# Patient Record
Sex: Female | Born: 1975 | Race: Black or African American | Hispanic: No | Marital: Married | Smoking: Never smoker
Health system: Southern US, Community
[De-identification: ages and names within clinical notes are randomized; demographics above are authoritative.]

## PROBLEM LIST (undated history)

## (undated) VITALS — BP 120/83 | HR 88 | Temp 98.4°F | Resp 18 | Ht 63.0 in | Wt 162.0 lb

## (undated) DIAGNOSIS — F329 Major depressive disorder, single episode, unspecified: Secondary | ICD-10-CM

## (undated) DIAGNOSIS — I10 Essential (primary) hypertension: Secondary | ICD-10-CM

## (undated) DIAGNOSIS — T8859XA Other complications of anesthesia, initial encounter: Secondary | ICD-10-CM

## (undated) DIAGNOSIS — R112 Nausea with vomiting, unspecified: Secondary | ICD-10-CM

## (undated) DIAGNOSIS — Z9889 Other specified postprocedural states: Secondary | ICD-10-CM

## (undated) DIAGNOSIS — F32A Depression, unspecified: Secondary | ICD-10-CM

## (undated) DIAGNOSIS — I639 Cerebral infarction, unspecified: Secondary | ICD-10-CM

## (undated) DIAGNOSIS — T4145XA Adverse effect of unspecified anesthetic, initial encounter: Secondary | ICD-10-CM

## (undated) DIAGNOSIS — F319 Bipolar disorder, unspecified: Secondary | ICD-10-CM

## (undated) DIAGNOSIS — O1490 Unspecified pre-eclampsia, unspecified trimester: Secondary | ICD-10-CM

## (undated) HISTORY — PX: HERNIA REPAIR: SHX51

## (undated) HISTORY — DX: Essential (primary) hypertension: I10

## (undated) HISTORY — DX: Cerebral infarction, unspecified: I63.9

---

## 1999-09-06 ENCOUNTER — Other Ambulatory Visit: Admission: RE | Admit: 1999-09-06 | Discharge: 1999-09-06 | Payer: Self-pay | Admitting: Obstetrics and Gynecology

## 2001-07-04 ENCOUNTER — Ambulatory Visit (HOSPITAL_COMMUNITY): Admission: RE | Admit: 2001-07-04 | Discharge: 2001-07-04 | Payer: Self-pay | Admitting: *Deleted

## 2003-07-08 ENCOUNTER — Ambulatory Visit (HOSPITAL_COMMUNITY): Admission: RE | Admit: 2003-07-08 | Discharge: 2003-07-08 | Payer: Self-pay | Admitting: Obstetrics and Gynecology

## 2003-07-15 ENCOUNTER — Encounter: Admission: RE | Admit: 2003-07-15 | Discharge: 2003-07-15 | Payer: Self-pay | Admitting: Family Medicine

## 2003-07-16 ENCOUNTER — Encounter: Admission: RE | Admit: 2003-07-16 | Discharge: 2003-07-16 | Payer: Self-pay | Admitting: Family Medicine

## 2003-08-05 ENCOUNTER — Encounter: Admission: RE | Admit: 2003-08-05 | Discharge: 2003-08-05 | Payer: Self-pay | Admitting: Family Medicine

## 2003-08-19 ENCOUNTER — Encounter: Admission: RE | Admit: 2003-08-19 | Discharge: 2003-08-19 | Payer: Self-pay | Admitting: Family Medicine

## 2003-08-26 ENCOUNTER — Encounter: Admission: RE | Admit: 2003-08-26 | Discharge: 2003-08-26 | Payer: Self-pay | Admitting: *Deleted

## 2003-09-09 ENCOUNTER — Encounter: Admission: RE | Admit: 2003-09-09 | Discharge: 2003-09-09 | Payer: Self-pay | Admitting: Family Medicine

## 2003-09-16 ENCOUNTER — Ambulatory Visit (HOSPITAL_COMMUNITY): Admission: RE | Admit: 2003-09-16 | Discharge: 2003-09-16 | Payer: Self-pay | Admitting: *Deleted

## 2003-09-16 ENCOUNTER — Encounter: Admission: RE | Admit: 2003-09-16 | Discharge: 2003-09-16 | Payer: Self-pay | Admitting: *Deleted

## 2003-09-25 ENCOUNTER — Inpatient Hospital Stay (HOSPITAL_COMMUNITY): Admission: AD | Admit: 2003-09-25 | Discharge: 2003-09-25 | Payer: Self-pay | Admitting: *Deleted

## 2003-09-28 ENCOUNTER — Ambulatory Visit (HOSPITAL_COMMUNITY): Admission: RE | Admit: 2003-09-28 | Discharge: 2003-09-28 | Payer: Self-pay | Admitting: Obstetrics & Gynecology

## 2004-02-02 ENCOUNTER — Inpatient Hospital Stay (HOSPITAL_COMMUNITY): Admission: RE | Admit: 2004-02-02 | Discharge: 2004-02-07 | Payer: Self-pay | Admitting: Obstetrics & Gynecology

## 2004-02-02 ENCOUNTER — Encounter (INDEPENDENT_AMBULATORY_CARE_PROVIDER_SITE_OTHER): Payer: Self-pay | Admitting: Specialist

## 2008-11-11 ENCOUNTER — Emergency Department (HOSPITAL_COMMUNITY): Admission: EM | Admit: 2008-11-11 | Discharge: 2008-11-11 | Payer: Self-pay | Admitting: Family Medicine

## 2010-01-26 ENCOUNTER — Emergency Department (HOSPITAL_COMMUNITY)
Admission: EM | Admit: 2010-01-26 | Discharge: 2010-01-26 | Payer: Self-pay | Source: Home / Self Care | Admitting: Family Medicine

## 2010-04-08 ENCOUNTER — Inpatient Hospital Stay (INDEPENDENT_AMBULATORY_CARE_PROVIDER_SITE_OTHER)
Admission: RE | Admit: 2010-04-08 | Discharge: 2010-04-08 | Disposition: A | Discharge status: Home / Self Care | Payer: Medicaid Other | Source: Ambulatory Visit | Attending: Family Medicine | Admitting: Family Medicine

## 2010-04-08 DIAGNOSIS — J40 Bronchitis, not specified as acute or chronic: Secondary | ICD-10-CM

## 2010-07-07 NOTE — Discharge Summary (Signed)
Mallory Robinson, THATCH NO.:  [id number]   MEDICAL RECORD NO.:  [id number]          PATIENT TYPE:  INP   LOCATION:  9319                          FACILITY:  [facility]   PHYSICIAN:  Roseanna Rainbow, M.D.DATE OF BIRTH:  [birth year]   DATE OF ADMISSION:  02/02/2004  DATE OF DISCHARGE:  02/07/2004                                 DISCHARGE SUMMARY   CHIEF COMPLAINT:  The patient is a 35 year old gravida 2 para 1 with an  estimated date of confinement of February 17, 2004 with an intrauterine  pregnancy at 57 and six-sevenths weeks with chronic hypertension, rule out  superimposed pregnancy-induced hypertension and visual disturbances.  Please  see the dictated History and Physical for further details.   HOSPITAL COURSE:  The patient was admitted.  She was started on magnesium  sulfate seizure prophylaxis and she underwent a repeat cesarean delivery.  Please see the dictated operative report for further details.  Postoperatively, she received magnesium sulfate seizure prophylaxis for 36  hours.  She had an unsustained fever on postoperative day #3 with a Tmax of  102.7.  She remained afebrile for the remainder of her hospital course.  Her  blood pressures remained borderline elevated and she was asymptomatic.  She  was discharged to home on postoperative day #5 tolerating a regular diet.   DISCHARGE DIAGNOSES:  1.  Severe pregnancy-induced hypertension.  2.  Intrauterine pregnancy at 37+ weeks.  3.  History of a previous cesarean delivery.   PROCEDURE:  Repeat cesarean delivery.   CONDITION:  Stable.   DIET:  Regular.   ACTIVITY:  No strenuous activity, pelvic rest.   MEDICATIONS:  Percocet, ibuprofen, prenatal vitamins, ferrous sulfate, and  Colace.   DISPOSITION:  The patient was to follow up in the office in 2 days.     Collier Flowers  D:  02/25/2004  T:  02/25/2004  Job:  [id number]
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
## 2010-07-07 NOTE — Op Note (Signed)
NAMEACHSAH, MCQUADE                 ACCOUNT NO.:  [id number]   MEDICAL RECORD NO.:  [id number]          PATIENT TYPE:  INP   LOCATION:  9135                          [facility]ACILITY:  [facility]   PHYSICIAN:  Roseanna Rainbow, M.D.DATE OF BIRTH:  [birth year]   DATE OF PROCEDURE:  02/02/2004  DATE OF DISCHARGE:                                 OPERATIVE REPORT   PREOPERATIVE DIAGNOSES:  1.  Intrauterine pregnancy at 37-6/7 weeks with severe pregnancy-induced      hypertension.  2.  History of a previous cesarean delivery, declines trial of labor.   POSTOPERATIVE DIAGNOSES:  1.  Intrauterine pregnancy at 37-6/7 weeks with severe pregnancy-induced      hypertension.  2.  History of a previous cesarean delivery, declines trial of labor.   PROCEDURE:  Repeat low uterine flap elliptical cesarean delivery via  Pfannenstiel skin incision.   SURGEONS:  Roseanna Rainbow, M.D., Bing Neighbors. Clearance Coots, M.D.   ANESTHESIA:  Spinal.   ESTIMATED BLOOD LOSS:  600 mL.   COMPLICATIONS:  None.   IV fluids and urine output as per anesthesiology.   PROCEDURE:  The patient was taken to the operating room, where a spinal  anesthetic was administered without difficulty.  She was then placed in the  dorsal supine position with a leftward tilt and prepped and draped in the  usual sterile fashion.  A Pfannenstiel skin incision was then made through  the previous scar with a scalpel.  This was extended down to the underlying  fascia with a Bovie.  The fascia was nicked in the midline.  The fascial  incision was then extended bilaterally with curved Mayo scissors.  The  superior aspect of the fascial incision was then tented up with Kocher  clamps and the underlying rectus muscles dissected off.  The inferior aspect  of the fascial incision was manipulated in a similar fashion.  The rectus  muscles were separated in the midline.  The parietal peritoneum was entered  bluntly.  The peritoneal incision was then  extended superiorly and  inferiorly with good visualization of the bladder.  The bladder blade was  then placed.  The vesicouterine peritoneum was tented up and entered  sharply.  This incision was then extended bilaterally and the bladder flap  created sharply.  The lower uterine segment was then incised in a transverse  fashion with a scalpel.  The incision was extended bilaterally with curved  Mayo scissors.  At this point the infant's head was noted to be  hyperextended.  The infant's head was then delivered atraumatically with the  assistance of vacuum extraction.  The head was delivered on the second  attempt.  The cord was clamped and cut.  The infant was handed over to the  awaiting neonatologist.  The placenta was then removed.  The uterus was  evacuated of any remaining amniotic fluid, clots, and debris with a  moistened laparotomy sponge.  The cervix was dilated with a Kelly clamp.  The uterine incision was then reapproximated in a running interlocking  fashion.  A second imbricating suture line was  then placed.  Adequate  hemostasis was noted.  The paracolic gutters were copiously irrigated.  The  parietal peritoneum was reapproximated in a running fashion with 2-0 Vicryl.  The fascia  was reapproximated in a running fashion with 0 PDS.  The skin was  reapproximated in a subcuticular fashion with 3-0 Monocryl.  At the close of  the procedure, the instrument and pad counts were said to be correct x2.  Cefazolin 1 g was given at cord clamp.  The patient was taken to the PACU  awake and in stable condition.     Collier Flowers  D:  02/02/2004  T:  02/02/2004  Job:  [id number]

## 2011-03-01 ENCOUNTER — Emergency Department (INDEPENDENT_AMBULATORY_CARE_PROVIDER_SITE_OTHER)
Admission: EM | Admit: 2011-03-01 | Discharge: 2011-03-01 | Disposition: A | Discharge status: Home / Self Care | Payer: Medicaid Other | Source: Home / Self Care | Attending: Family Medicine | Admitting: Family Medicine

## 2011-03-01 ENCOUNTER — Encounter (HOSPITAL_COMMUNITY): Payer: Self-pay | Admitting: Emergency Medicine

## 2011-03-01 DIAGNOSIS — J069 Acute upper respiratory infection, unspecified: Secondary | ICD-10-CM

## 2011-03-01 MED ORDER — HYDROCOD POLST-CHLORPHEN POLST 10-8 MG/5ML PO LQCR: 5.0000 mL | Freq: Two times a day (BID) | ORAL | Status: DC

## 2011-03-01 MED ORDER — AZITHROMYCIN 250 MG PO TABS: ORAL_TABLET | ORAL | Status: AC

## 2011-03-01 NOTE — ED Provider Notes (Addendum)
History     CSN: [id number]  Arrival date & time 03/01/11  0865   First MD Initiated Contact with Patient 03/01/11 1725      Chief Complaint  Patient presents with  . Cough    (Consider location/radiation/quality/duration/timing/severity/associated sxs/prior treatment) Patient is a 36 y.o. female presenting with cough. The history is provided by the patient.  Cough This is a new problem. The current episode started more than 1 week ago. The problem has not changed since onset.The cough is non-productive. There has been no fever. Associated symptoms include rhinorrhea and sore throat. Pertinent negatives include no chills, no headaches and no wheezing. She is not a smoker.    History reviewed. No pertinent past medical history.  History reviewed. No pertinent past surgical history.  No family history on file.  History  Substance Use Topics  . Smoking status: Never Smoker   . Smokeless tobacco: Not on file  . Alcohol Use: Yes     Occasional    OB History    Grav Para Term Preterm Abortions TAB SAB Ect Mult Living                  Review of Systems  Constitutional: Negative for chills.  HENT: Positive for nosebleeds, congestion, sore throat and rhinorrhea.   Respiratory: Positive for cough. Negative for wheezing.   Neurological: Negative for headaches.    Allergies  Penicillins  Home Medications   Current Outpatient Rx  Name Route Sig Dispense Refill  . BIOTIN 1000 MCG PO TABS Oral Take 1,000 mcg by mouth 3 (three) times daily.    Marland Kitchen FLUOXETINE HCL 20 MG PO CAPS Oral Take 20 mg by mouth 2 (two) times daily.    Marland Kitchen LISINOPRIL-HYDROCHLOROTHIAZIDE 20-12.5 MG PO TABS Oral Take 1 tablet by mouth daily.    . QUETIAPINE FUMARATE ER 150 MG PO TB24 Oral Take 150 mg by mouth at bedtime.    . TOPIRAMATE 25 MG PO CPSP Oral Take 25 mg by mouth 1 day or 1 dose.    Marland Kitchen AZITHROMYCIN 250 MG PO TABS  Take as directed on pack 6 each 0  . HYDROCOD POLST-CPM POLST ER 10-8 MG/5ML PO  LQCR Oral Take 5 mLs by mouth every 12 (twelve) hours. 115 mL 0    BP 131/92  Pulse 82  Temp(Src) 99.5 F (37.5 C) (Oral)  Resp 16  SpO2 100%  LMP 03/01/2011  Physical Exam  Nursing note and vitals reviewed. Constitutional: She is oriented to person, place, and time. She appears well-developed and well-nourished.  HENT:  Head: Normocephalic.  Right Ear: External ear normal.  Left Ear: External ear normal.  Nose: Nose normal.  Mouth/Throat: Oropharynx is clear and moist.  Eyes: Conjunctivae and EOM are normal. Pupils are equal, round, and reactive to light.  Neck: Normal range of motion. Neck supple.  Cardiovascular: Normal rate, regular rhythm, normal heart sounds and intact distal pulses.   Pulmonary/Chest: Effort normal and breath sounds normal.  Lymphadenopathy:    She has no cervical adenopathy.  Neurological: She is alert and oriented to person, place, and time.  Skin: Skin is warm and dry.    ED Course  Procedures (including critical care time)  Labs Reviewed - No data to display No results found.   1. URI (upper respiratory infection)       MDM          Barkley Bruns, MD 03/01/11 1940  Barkley Bruns, MD 03/01/11 [phone]

## 2011-03-01 NOTE — ED Notes (Signed)
Pt c/o cough, fatigue, and sore throat for a month. Also having ear itchiness and nose bleeds.

## 2012-07-28 ENCOUNTER — Emergency Department (HOSPITAL_COMMUNITY)
Admission: EM | Admit: 2012-07-28 | Discharge: 2012-07-28 | Disposition: A | Discharge status: Home / Self Care | Payer: Medicaid Other | Source: Home / Self Care | Attending: Emergency Medicine | Admitting: Emergency Medicine

## 2012-07-28 ENCOUNTER — Encounter (HOSPITAL_COMMUNITY): Payer: Self-pay | Admitting: *Deleted

## 2012-07-28 DIAGNOSIS — N390 Urinary tract infection, site not specified: Secondary | ICD-10-CM

## 2012-07-28 LAB — POCT URINALYSIS DIP (DEVICE)
Glucose, UA: 100 mg/dL — AB
Urobilinogen, UA: 0.2 mg/dL (ref 0.0–1.0)
pH: 6 (ref 5.0–8.0)

## 2012-07-28 LAB — POCT PREGNANCY, URINE: Preg Test, Ur: NEGATIVE

## 2012-07-28 MED ORDER — PHENAZOPYRIDINE HCL 200 MG PO TABS: 200.0000 mg | ORAL_TABLET | Freq: Three times a day (TID) | ORAL | Status: DC

## 2012-07-28 MED ORDER — SULFAMETHOXAZOLE-TRIMETHOPRIM 800-160 MG PO TABS: 1.0000 | ORAL_TABLET | Freq: Two times a day (BID) | ORAL | Status: AC

## 2012-07-28 NOTE — ED Notes (Signed)
Pt  Reports  Symptoms  Of  Urinary  Frequency  With  Pressure  And  Urgency  denys  Any bleeding   /  Discharge

## 2012-07-28 NOTE — ED Provider Notes (Signed)
A History     CSN: [id number]  Arrival date & time 07/28/12  1710   First MD Initiated Contact with Patient 07/28/12 1824      Chief Complaint  Patient presents with  . Urinary Frequency    (Consider location/radiation/quality/duration/timing/severity/associated sxs/prior treatment) HPI Comments: Since urgent care this evening complaining of urinary pressure increased frequency and pain. For the last couple days. She reports that his feels just like a urinary tract infection is being many years and she had one. Patient denies any vaginal discharge or concerns of having been exposed to any sexually transmitted illnesses. She denies any nausea vomiting or flank pain.  Patient is a 37 y.o. female presenting with frequency. The history is provided by the patient.  Urinary Frequency This is a new problem. The current episode started 2 days ago. The problem occurs constantly. The problem has been gradually worsening. Exacerbated by: urinating. Nothing relieves the symptoms. She has tried nothing for the symptoms. The treatment provided no relief.    History reviewed. No pertinent past medical history.  History reviewed. No pertinent past surgical history.  No family history on file.  History  Substance Use Topics  . Smoking status: Never Smoker   . Smokeless tobacco: Not on file  . Alcohol Use: Yes     Comment: Occasional    OB History   Grav Para Term Preterm Abortions TAB SAB Ect Mult Living                  Review of Systems  Constitutional: Negative for fever, diaphoresis, activity change and fatigue.  Genitourinary: Positive for dysuria and frequency. Negative for urgency, hematuria, flank pain, decreased urine volume, vaginal bleeding, vaginal discharge and pelvic pain.    Allergies  Penicillins  Home Medications   Current Outpatient Rx  Name  Route  Sig  Dispense  Refill  . Biotin 1000 MCG tablet   Oral   Take 1,000 mcg by mouth 3 (three) times daily.         . chlorpheniramine-HYDROcodone (TUSSIONEX PENNKINETIC ER) 10-8 MG/5ML LQCR   Oral   Take 5 mLs by mouth every 12 (twelve) hours.   115 mL   0   . FLUoxetine (PROZAC) 20 MG capsule   Oral   Take 20 mg by mouth 2 (two) times daily.         Marland Kitchen lisinopril-hydrochlorothiazide (PRINZIDE,ZESTORETIC) 20-12.5 MG per tablet   Oral   Take 1 tablet by mouth daily.         . QUEtiapine Fumarate (SEROQUEL XR) 150 MG 24 hr tablet   Oral   Take 150 mg by mouth at bedtime.         . topiramate (TOPAMAX) 25 MG capsule   Oral   Take 25 mg by mouth 1 day or 1 dose.           BP 141/100  Pulse 83  Temp(Src) 98.2 F (36.8 C) (Oral)  Resp 20  SpO2 98%  LMP 06/11/2012  Physical Exam  Nursing note and vitals reviewed. Constitutional: She appears well-developed and well-nourished.  Abdominal: Soft. Normal appearance. She exhibits no mass. There is no hepatosplenomegaly, splenomegaly or hepatomegaly. There is tenderness in the suprapubic area. There is no rebound, no guarding, no CVA tenderness, no tenderness at McBurney's point and negative Murphy's sign. No hernia. Hernia confirmed negative in the ventral area.  Neurological: She is alert.  Skin: No rash noted.    ED Course  Procedures (including critical  care time)  Labs Reviewed  POCT URINALYSIS DIP (DEVICE) - Abnormal; Notable for the following:    Glucose, UA 100 (*)    Hgb urine dipstick TRACE (*)    Nitrite POSITIVE (*)    Leukocytes, UA TRACE (*)    All other components within normal limits  POCT PREGNANCY, URINE   No results found.   No diagnosis found.    MDM  Uncomplicated urinary tract infection,  Rx for Keflex 500 mg TID X 7 DAYS Rx Pyridium       Jimmie Molly, MD 07/28/12 [phone]

## 2013-03-04 ENCOUNTER — Inpatient Hospital Stay (HOSPITAL_COMMUNITY)
Admission: AD | Admit: 2013-03-04 | Discharge: 2013-03-11 | DRG: 897 | Disposition: A | Discharge status: Home / Self Care | Payer: No Typology Code available for payment source | Source: Intra-hospital | Attending: Psychiatry | Admitting: Psychiatry

## 2013-03-04 ENCOUNTER — Encounter (HOSPITAL_COMMUNITY): Payer: Self-pay | Admitting: Emergency Medicine

## 2013-03-04 ENCOUNTER — Emergency Department (HOSPITAL_COMMUNITY)
Admission: EM | Admit: 2013-03-04 | Discharge: 2013-03-04 | Disposition: A | Discharge status: Other Acute Inpatient Hospital | Payer: Medicaid Other | Attending: Emergency Medicine | Admitting: Emergency Medicine

## 2013-03-04 ENCOUNTER — Encounter (HOSPITAL_COMMUNITY): Payer: Self-pay | Admitting: *Deleted

## 2013-03-04 DIAGNOSIS — Z3202 Encounter for pregnancy test, result negative: Secondary | ICD-10-CM | POA: Insufficient documentation

## 2013-03-04 DIAGNOSIS — Z880 Allergy status to penicillin: Secondary | ICD-10-CM | POA: Insufficient documentation

## 2013-03-04 DIAGNOSIS — R4689 Other symptoms and signs involving appearance and behavior: Secondary | ICD-10-CM

## 2013-03-04 DIAGNOSIS — F4310 Post-traumatic stress disorder, unspecified: Secondary | ICD-10-CM | POA: Diagnosis present

## 2013-03-04 DIAGNOSIS — R4589 Other symptoms and signs involving emotional state: Secondary | ICD-10-CM

## 2013-03-04 DIAGNOSIS — R45851 Suicidal ideations: Secondary | ICD-10-CM | POA: Insufficient documentation

## 2013-03-04 DIAGNOSIS — F1020 Alcohol dependence, uncomplicated: Principal | ICD-10-CM | POA: Diagnosis present

## 2013-03-04 DIAGNOSIS — F314 Bipolar disorder, current episode depressed, severe, without psychotic features: Secondary | ICD-10-CM | POA: Diagnosis present

## 2013-03-04 DIAGNOSIS — Z79899 Other long term (current) drug therapy: Secondary | ICD-10-CM

## 2013-03-04 DIAGNOSIS — F319 Bipolar disorder, unspecified: Secondary | ICD-10-CM | POA: Diagnosis present

## 2013-03-04 HISTORY — DX: Bipolar disorder, unspecified: F31.9

## 2013-03-04 HISTORY — DX: Depression, unspecified: F32.A

## 2013-03-04 HISTORY — DX: Major depressive disorder, single episode, unspecified: F32.9

## 2013-03-04 LAB — CBC
HCT: 31.6 % — ABNORMAL LOW (ref 36.0–46.0)
HEMOGLOBIN: 9.6 g/dL — AB (ref 12.0–15.0)
MCH: 19.3 pg — ABNORMAL LOW (ref 26.0–34.0)
MCHC: 30.4 g/dL (ref 30.0–36.0)
MCV: 63.5 fL — ABNORMAL LOW (ref 78.0–100.0)
Platelets: 215 10*3/uL (ref 150–400)
RBC: 4.98 MIL/uL (ref 3.87–5.11)
RDW: 19.9 % — AB (ref 11.5–15.5)
WBC: 6.7 10*3/uL (ref 4.0–10.5)

## 2013-03-04 LAB — BASIC METABOLIC PANEL
BUN: 9 mg/dL (ref 6–23)
CALCIUM: 9.5 mg/dL (ref 8.4–10.5)
CO2: 23 mEq/L (ref 19–32)
CREATININE: 0.88 mg/dL (ref 0.50–1.10)
Chloride: 101 mEq/L (ref 96–112)
GFR, EST NON AFRICAN AMERICAN: 82 mL/min — AB (ref >90)
Glucose, Bld: 106 mg/dL — ABNORMAL HIGH (ref 70–99)
Potassium: 4 mEq/L (ref 3.7–5.3)
SODIUM: 140 meq/L (ref 137–147)

## 2013-03-04 LAB — RAPID URINE DRUG SCREEN, HOSP PERFORMED
Amphetamines: NOT DETECTED
Barbiturates: NOT DETECTED
Benzodiazepines: NOT DETECTED
COCAINE: NOT DETECTED
Opiates: NOT DETECTED
Tetrahydrocannabinol: NOT DETECTED

## 2013-03-04 LAB — ETHANOL: Alcohol, Ethyl (B): 106 mg/dL — ABNORMAL HIGH (ref 0–11)

## 2013-03-04 LAB — PREGNANCY, URINE: PREG TEST UR: NEGATIVE

## 2013-03-04 MED ORDER — CHLORDIAZEPOXIDE HCL 25 MG PO CAPS
25.0000 mg | ORAL_CAPSULE | Freq: Four times a day (QID) | ORAL | Status: AC
  Administered 2013-03-04 (×3): 25 mg via ORAL
  Filled 2013-03-04 (×3): qty 1

## 2013-03-04 MED ORDER — ZOLPIDEM TARTRATE 5 MG PO TABS: 5.0000 mg | ORAL_TABLET | Freq: Every evening | ORAL | Status: DC | PRN

## 2013-03-04 MED ORDER — THIAMINE HCL 100 MG/ML IJ SOLN
100.0000 mg | Freq: Once | INTRAMUSCULAR | Status: AC
  Administered 2013-03-04: 100 mg via INTRAMUSCULAR
  Filled 2013-03-04: qty 2

## 2013-03-04 MED ORDER — LOPERAMIDE HCL 2 MG PO CAPS: 2.0000 mg | ORAL_CAPSULE | ORAL | Status: AC | PRN

## 2013-03-04 MED ORDER — CHLORDIAZEPOXIDE HCL 25 MG PO CAPS
25.0000 mg | ORAL_CAPSULE | ORAL | Status: AC
  Administered 2013-03-06 (×2): 25 mg via ORAL
  Filled 2013-03-04 (×2): qty 1

## 2013-03-04 MED ORDER — CHLORDIAZEPOXIDE HCL 25 MG PO CAPS: 25.0000 mg | ORAL_CAPSULE | Freq: Once | ORAL | Status: DC

## 2013-03-04 MED ORDER — IBUPROFEN 200 MG PO TABS: 600.0000 mg | ORAL_TABLET | Freq: Three times a day (TID) | ORAL | Status: DC | PRN

## 2013-03-04 MED ORDER — ONDANSETRON 4 MG PO TBDP
4.0000 mg | ORAL_TABLET | Freq: Four times a day (QID) | ORAL | Status: AC | PRN
  Administered 2013-03-04: 4 mg via ORAL
  Filled 2013-03-04: qty 1

## 2013-03-04 MED ORDER — CHLORDIAZEPOXIDE HCL 25 MG PO CAPS
25.0000 mg | ORAL_CAPSULE | Freq: Three times a day (TID) | ORAL | Status: AC
Start: 2013-03-05 — End: 2013-03-05
  Administered 2013-03-05 (×3): 25 mg via ORAL
  Filled 2013-03-04 (×3): qty 1

## 2013-03-04 MED ORDER — ACETAMINOPHEN 325 MG PO TABS
650.0000 mg | ORAL_TABLET | Freq: Four times a day (QID) | ORAL | Status: DC | PRN
Start: 2013-03-04 — End: 2013-03-11
  Administered 2013-03-05 – 2013-03-11 (×3): 650 mg via ORAL
  Filled 2013-03-04 (×3): qty 2

## 2013-03-04 MED ORDER — MAGNESIUM HYDROXIDE 400 MG/5ML PO SUSP: 30.0000 mL | Freq: Every day | ORAL | Status: DC | PRN

## 2013-03-04 MED ORDER — CHLORDIAZEPOXIDE HCL 25 MG PO CAPS
25.0000 mg | ORAL_CAPSULE | Freq: Four times a day (QID) | ORAL | Status: AC | PRN
  Administered 2013-03-04: 25 mg via ORAL

## 2013-03-04 MED ORDER — HYDROXYZINE HCL 25 MG PO TABS
25.0000 mg | ORAL_TABLET | Freq: Four times a day (QID) | ORAL | Status: AC | PRN
Start: 2013-03-04 — End: 2013-03-07

## 2013-03-04 MED ORDER — VITAMIN B-1 100 MG PO TABS
100.0000 mg | ORAL_TABLET | Freq: Every day | ORAL | Status: DC
  Administered 2013-03-05 – 2013-03-10 (×6): 100 mg via ORAL
  Filled 2013-03-04 (×9): qty 1

## 2013-03-04 MED ORDER — ADULT MULTIVITAMIN W/MINERALS CH
1.0000 | ORAL_TABLET | Freq: Every day | ORAL | Status: DC
  Administered 2013-03-04 – 2013-03-10 (×7): 1 via ORAL
  Filled 2013-03-04 (×11): qty 1

## 2013-03-04 MED ORDER — ACETAMINOPHEN 325 MG PO TABS: 650.0000 mg | ORAL_TABLET | ORAL | Status: DC | PRN

## 2013-03-04 MED ORDER — TRAZODONE HCL 50 MG PO TABS
50.0000 mg | ORAL_TABLET | Freq: Every evening | ORAL | Status: DC | PRN
  Administered 2013-03-04 – 2013-03-10 (×2): 50 mg via ORAL
  Filled 2013-03-04: qty 1
  Filled 2013-03-04: qty 14
  Filled 2013-03-04: qty 1

## 2013-03-04 MED ORDER — LORAZEPAM 1 MG PO TABS: 1.0000 mg | ORAL_TABLET | Freq: Three times a day (TID) | ORAL | Status: DC | PRN

## 2013-03-04 MED ORDER — CHLORDIAZEPOXIDE HCL 25 MG PO CAPS
25.0000 mg | ORAL_CAPSULE | Freq: Every day | ORAL | Status: AC
  Administered 2013-03-07: 25 mg via ORAL
  Filled 2013-03-04: qty 1

## 2013-03-04 MED ORDER — ALUM & MAG HYDROXIDE-SIMETH 200-200-20 MG/5ML PO SUSP: 30.0000 mL | ORAL | Status: DC | PRN

## 2013-03-04 NOTE — Progress Notes (Signed)
[facility] provided pt with a list of primary care resources and a [employer] of the [place].

## 2013-03-04 NOTE — ED Notes (Signed)
Report called to Adult Unit [facility]; [facility] notified of need for transport to [facility]

## 2013-03-04 NOTE — [facility] Group Notes (Signed)
[facility] LCSW Group Therapy  03/04/2013 3:34 PM  Type of Therapy:  Group Therapy  Participation Level:  None-Tracie just arrived on unit and came to group during last five minutes. She listened quietly as others shared.   Emotion Regulation: This group focused on both positive and negative emotion identification and allowed group members to process ways to identify feelings, regulate negative emotions, and find healthy ways to manage internal/external emotions. Group members were asked to reflect on a time when their reaction to an emotion led to a negative outcome and explored how alternative responses using emotion regulation would have benefited them. Group members were also asked to discuss a time when emotion regulation was utilized when a negative emotion was experienced.    Smart, Billee Balcerzak LCSWA  03/04/2013, 3:34 PM
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
## 2013-03-04 NOTE — Progress Notes (Signed)
Patient received a Tele Assessment as a walk in at [facility].  Writer consulted with the PA, (John Withrow).  Per, NP (John C. Withrow), the patient meets criteria for inpatient hospitalization and will be accepted to [facility] Bed 305-02.  Writer informed the charge nurse Delsa Sale) that the patient will be coming to the ER for medical clearance.  Writer informed the TTS Counselor Arby Barrette that the patient has been accepted to [facility].

## 2013-03-04 NOTE — ED Provider Notes (Signed)
CSN: [id number]     Arrival date & time 03/04/13  5573 History   First MD Initiated Contact with Patient 03/04/13 0914     Chief Complaint  Patient presents with  . Medical Clearance    HPI Hx of bipolar. Off medications for several months. Vague thoughts of jumping off bridge. No ingestions. No hallucinations. Pt concerned and pt spouse concerned. Went to [facility] and was told to come to ER for clearance. Bed available.   Past Medical History  Diagnosis Date  . Bipolar 1 disorder   . Depression    No past surgical history on file. No family history on file. History  Substance Use Topics  . Smoking status: Never Smoker   . Smokeless tobacco: Not on file  . Alcohol Use: Yes     Comment: Occasional   OB History   Grav Para Term Preterm Abortions TAB SAB Ect Mult Living                 Review of Systems  All other systems reviewed and are negative.    Allergies  Penicillins  Home Medications   Current Outpatient Rx  Name  Route  Sig  Dispense  Refill  . Biotin 1000 MCG tablet   Oral   Take 1,000 mcg by mouth 3 (three) times daily.         . chlorpheniramine-HYDROcodone (TUSSIONEX PENNKINETIC ER) 10-8 MG/5ML LQCR   Oral   Take 5 mLs by mouth every 12 (twelve) hours.   115 mL   0   . FLUoxetine (PROZAC) 20 MG capsule   Oral   Take 20 mg by mouth 2 (two) times daily.         Marland Kitchen lisinopril-hydrochlorothiazide (PRINZIDE,ZESTORETIC) 20-12.5 MG per tablet   Oral   Take 1 tablet by mouth daily.         . phenazopyridine (PYRIDIUM) 200 MG tablet   Oral   Take 1 tablet (200 mg total) by mouth 3 (three) times daily.   6 tablet   0   . QUEtiapine Fumarate (SEROQUEL XR) 150 MG 24 hr tablet   Oral   Take 150 mg by mouth at bedtime.         . topiramate (TOPAMAX) 25 MG capsule   Oral   Take 25 mg by mouth 1 day or 1 dose.          BP 136/102  Pulse 84  Temp(Src) 99.4 F (37.4 C) (Oral)  Resp 14  Ht 5\' 3"  (1.6 m)  Wt 173 lb (78.472 kg)  BMI 30.65  kg/m2  SpO2 99%  LMP 01/08/2013 Physical Exam  Nursing note and vitals reviewed. Constitutional: She is oriented to person, place, and time. She appears well-developed and well-nourished. No distress.  HENT:  Head: Normocephalic and atraumatic.  Eyes: EOM are normal.  Neck: Normal range of motion.  Cardiovascular: Normal rate, regular rhythm and normal heart sounds.   Pulmonary/Chest: Effort normal and breath sounds normal.  Abdominal: Soft. She exhibits no distension. There is no tenderness.  Musculoskeletal: Normal range of motion.  Neurological: She is alert and oriented to person, place, and time.  Skin: Skin is warm and dry.  Psychiatric: She has a normal mood and affect. Her speech is normal and behavior is normal. Judgment normal. Thought content is not delusional. Cognition and memory are normal. She expresses suicidal ideation. She expresses no homicidal ideation. She expresses suicidal plans. She expresses no homicidal plans.    ED Course  Procedures (including critical care time) Labs Review Labs Reviewed  CBC  BASIC METABOLIC PANEL  PREGNANCY, URINE  ETHANOL  URINE RAPID DRUG SCREEN (HOSP PERFORMED)   Imaging Review No results found.  EKG Interpretation   None       MDM  No diagnosis found. Medically cleared. Await [place] bed    Hoy Morn, MD 03/04/13 [phone]

## 2013-03-04 NOTE — Progress Notes (Signed)
Attended Group

## 2013-03-04 NOTE — Tx Team (Signed)
Initial Interdisciplinary Treatment Plan  PATIENT STRENGTHS: (choose at least two) Ability for insight Average or above average intelligence Capable of independent living General fund of knowledge Supportive family/friends  PATIENT STRESSORS: Financial difficulties Health problems Medication change or noncompliance Substance abuse   PROBLEM LIST: Problem List/Patient Goals Date to be addressed Date deferred Reason deferred Estimated date of resolution  depression 03/04/2013     Substance abuse 03/04/2013                                                DISCHARGE CRITERIA:  Ability to meet basic life and health needs Improved stabilization in mood, thinking, and/or behavior Need for constant or close observation no longer present Withdrawal symptoms are absent or subacute and managed without 24-hour nursing intervention  PRELIMINARY DISCHARGE PLAN: Attend aftercare/continuing care group Outpatient therapy Return to previous living arrangement  PATIENT/FAMIILY INVOLVEMENT: This treatment plan has been presented to and reviewed with the patient, Mallory Robinson.  The patient and family have been given the opportunity to ask questions and make suggestions.  Marilynne Halsted M 03/04/2013, 7:36 PM

## 2013-03-04 NOTE — Progress Notes (Signed)
Recreation Therapy Notes  Date: 01.14.2015 Time: 3:00 Location: [address] Dayroom   Group Topic: Communication, Team Building, Problem Solving  Goal Area(s) Addresses:  Patient will effectively work with peer towards shared goal.  Patient will identify skill used to make activity successful.  Patient will identify how skills used during activity can be used to reach post d/c goals.   Behavioral Response:  Appropriate   Intervention: Problem Solving Activitiy  Activity: Life Boat. Patients were given a scenario about being on a sinking yacht. Patients were informed the yacht included 24 guest, 8 of which could be placed on the life boat, along with all group members. Individuals on guest list were of varying socioeconomic classes such as a Information systems manager, Associate Professor, Recruitment consultant, Barrister's clerk.   Education: Education officer, community, Discharge Planning   Education Outcome: Acknowledges understanding  Clinical Observations/Feedback: Patient engaged in activity by show of hands, but did not voice her opinion.   At approximately 3:20pm group session was ended due to group member showing signs of seizure like behavior.   Laureen Ochs Cayne Yom, LRT/CTRS  Lane Hacker 03/04/2013 4:47 PM
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
## 2013-03-04 NOTE — [facility] Counselor (Signed)
Per [facility], pt has been accepted to [facility] by Patriciaann Clan to 305-2 and will be transferred from [facility] once medically cleared. Completed support paperwork faxed to [facility] and originals placed in pt's chart.  Arnold Long, [place] Assessment Counselor

## 2013-03-04 NOTE — ED Notes (Signed)
Pt transported to [facility] via [facility]

## 2013-03-05 ENCOUNTER — Encounter (HOSPITAL_COMMUNITY): Payer: Self-pay | Admitting: Psychiatry

## 2013-03-05 DIAGNOSIS — F1020 Alcohol dependence, uncomplicated: Principal | ICD-10-CM

## 2013-03-05 DIAGNOSIS — F314 Bipolar disorder, current episode depressed, severe, without psychotic features: Secondary | ICD-10-CM

## 2013-03-05 DIAGNOSIS — F4310 Post-traumatic stress disorder, unspecified: Secondary | ICD-10-CM

## 2013-03-05 DIAGNOSIS — F319 Bipolar disorder, unspecified: Secondary | ICD-10-CM | POA: Diagnosis present

## 2013-03-05 LAB — HEPATIC FUNCTION PANEL
ALK PHOS: 63 U/L (ref 39–117)
ALT: 21 U/L (ref 0–35)
AST: 29 U/L (ref 0–37)
Albumin: 4.2 g/dL (ref 3.5–5.2)
Bilirubin, Direct: <0.2 mg/dL (ref 0.0–0.3)
Total Bilirubin: 0.2 mg/dL — ABNORMAL LOW (ref 0.3–1.2)
Total Protein: 7.9 g/dL (ref 6.0–8.3)

## 2013-03-05 MED ORDER — TOPIRAMATE 25 MG PO TABS
25.0000 mg | ORAL_TABLET | Freq: Every day | ORAL | Status: DC
  Administered 2013-03-05 – 2013-03-10 (×6): 25 mg via ORAL
  Filled 2013-03-05 (×5): qty 1
  Filled 2013-03-05 (×2): qty 14
  Filled 2013-03-05 (×3): qty 1

## 2013-03-05 MED ORDER — QUETIAPINE FUMARATE 100 MG PO TABS
100.0000 mg | ORAL_TABLET | Freq: Every day | ORAL | Status: DC
  Administered 2013-03-05 – 2013-03-10 (×6): 100 mg via ORAL
  Filled 2013-03-05 (×4): qty 1
  Filled 2013-03-05 (×2): qty 14
  Filled 2013-03-05 (×3): qty 1

## 2013-03-05 MED ORDER — FERROUS SULFATE 325 (65 FE) MG PO TABS
325.0000 mg | ORAL_TABLET | Freq: Two times a day (BID) | ORAL | Status: DC
  Administered 2013-03-05 – 2013-03-10 (×10): 325 mg via ORAL
  Filled 2013-03-05 (×2): qty 1
  Filled 2013-03-05: qty 28
  Filled 2013-03-05 (×7): qty 1
  Filled 2013-03-05: qty 28
  Filled 2013-03-05 (×5): qty 1
  Filled 2013-03-05 (×2): qty 28

## 2013-03-05 MED ORDER — POLYETHYLENE GLYCOL 3350 17 G PO PACK
17.0000 g | PACK | Freq: Every day | ORAL | Status: DC
  Administered 2013-03-05 – 2013-03-10 (×6): 17 g via ORAL
  Filled 2013-03-05 (×2): qty 1
  Filled 2013-03-05: qty 14
  Filled 2013-03-05 (×4): qty 1
  Filled 2013-03-05: qty 14
  Filled 2013-03-05 (×2): qty 1

## 2013-03-05 MED ORDER — DOCUSATE SODIUM 100 MG PO CAPS
100.0000 mg | ORAL_CAPSULE | Freq: Two times a day (BID) | ORAL | Status: DC
Start: 2013-03-05 — End: 2013-03-11
  Administered 2013-03-05 – 2013-03-10 (×13): 100 mg via ORAL
  Filled 2013-03-05 (×6): qty 1
  Filled 2013-03-05: qty 28
  Filled 2013-03-05 (×4): qty 1
  Filled 2013-03-05: qty 28
  Filled 2013-03-05: qty 1
  Filled 2013-03-05 (×2): qty 28
  Filled 2013-03-05 (×4): qty 1

## 2013-03-05 MED ORDER — BACITRACIN-NEOMYCIN-POLYMYXIN OINTMENT TUBE
TOPICAL_OINTMENT | CUTANEOUS | Status: DC | PRN
  Administered 2013-03-06 – 2013-03-07 (×2): via TOPICAL
  Filled 2013-03-05 (×2): qty 15
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
## 2013-03-05 NOTE — Progress Notes (Signed)
Recreation Therapy Notes  INPATIENT RECREATION THERAPY ASSESSMENT  Patient Stressors: Family, Relationship, Work,   Coping Skills: Isolate, Substance Abuse, Child psychotherapist,   Leisure Interests: Actor,   Personal Challenges: Anger, Substance Abuse,   Patient aware of community resources? no  Emergency planning/management officer patient aware of: NONE  Patient indicated the following strengths:  NONE LISTED  Patient indicated interest in changing the following: NONE LISTED  Patient currently participates in the following recreation activities: NONE LISTED  Patient goal for hospitalization: NONE LISTED  Physical Disability: [place] of Residence: NOT LISTED  [place] of Residence: NOT LISTED  Laureen Ochs [place], LRT/CTRS  Lane Hacker 03/05/2013 4:49 PM
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
## 2013-03-05 NOTE — Progress Notes (Addendum)
D: Pt presents flat in affect and sad in mood. Pt endorses some on and off suicidal ideation. Writer verbalized the concept of verbally contracting for safety with patient. Pt agrees to verbally contract for safety at this time.  Pt has been adjusting well to the unit. Pt present for group this evening.  Pt feels like her heart is racing. Pt's heart rate was 108. Pt was administered some librium to relieve any anxiety or withdrawal symptoms that maybe associated with her elevated pulse and BP. Writer will monitor pt's blood pressure. Pt reports a hx of HTN. She reports taking BP medications in the past. Will follow-up with on-call provider in the morning if her Bp is elevated in the morning.  A: Writer administered scheduled and prn medications to pt. Continued support and availability as needed was extended to this pt. Staff continue to monitor pt with q6min checks.  R: No adverse drug reactions noted. Pt receptive to treatment. Pt remains safe at this time.

## 2013-03-05 NOTE — Progress Notes (Signed)
The focus of this group is to educate the patient on the purpose and policies of crisis stabilization and provide a format to answer questions about their admission.  The group details unit policies and expectations of patients while admitted.   Pt did not attend 0900 nurse orientation education group.  She was with the doctor during this group.

## 2013-03-05 NOTE — [facility] Group Notes (Signed)
[facility] LCSW Group Therapy  03/05/2013  1:15 PM   Type of Therapy:  Group Therapy  Participation Level:  Active  Participation Quality:  Attentive, Sharing and Supportive  Affect:  Depressed and Flat  Cognitive:  Alert and Oriented  Insight:  Developing/Improving and Engaged  Engagement in Therapy:  Developing/Improving and Engaged  Modes of Intervention:  Activity, Clarification, Confrontation, Discussion, Education, Exploration, Limit-setting, Orientation, Problem-solving, Rapport Building, Reality Testing, Socialization and Support  Summary of Progress/Problems: Patient was attentive and engaged with speaker from [employer].  Patient was attentive to speaker while they shared their story of dealing with mental health and overcoming it.  Patient expressed interest in their programs and services and received information on their agency.  Patient processed ways they can relate to the speaker.     Trini Soldo [place], LCSW 03/05/2013 1:43 PM   

## 2013-03-05 NOTE — Progress Notes (Signed)
Pt has been up and active in the milieu today.  She rated her depression and hopelessness a 6 and her anxiety a 4 on her self-inventory.  She denies any S/H ideation or A/V/H.  She did c/o feeling lightheaded and we discussed her changing position slowly and she voiced understanding. She plans to go to [facility] from here.

## 2013-03-05 NOTE — Progress Notes (Signed)
Patient ID: Mallory Robinson, female   DOB: 08-12-1975, 38 y.o.   MRN: [id number] Pt attended [place] meeting.

## 2013-03-05 NOTE — Progress Notes (Signed)
Recreation Therapy Notes  Animal-Assisted Activity/Therapy (AAA/T) Program Checklist/Progress Notes Patient Eligibility Criteria Checklist & Daily Group note for Rec Tx Intervention  Date: 01.15.2015 Time: 2:45pm Location: 21 hall dayroom    AAA/T Program Assumption of Risk Form signed by Patient/ or Parent Legal Guardian yes  Patient is free of allergies or sever asthma yes  Patient reports no fear of animals yes  Patient reports no history of cruelty to animals yes   Patient understands his/her participation is voluntary yes  Patient washes hands before animal contact yes  Patient washes hands after animal contact yes  Behavioral Response: Engaged, Approrpaite  Education: Contractor, Appropriate Animal Interaction   Education Outcome: Acknowledges understanding   Clinical Observations/Feedback: Patient interacted appropriately with therapy dog team, peers and LRT.  Patient shared information about her animals at home, stating she missed them greatly and will be excited to see them again.   Mallory Robinson Mallory Robinson, LRT/CTRS  Lynnann Knudsen L 03/05/2013 4:30 PM
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
## 2013-03-05 NOTE — Progress Notes (Signed)
D. Pt has been up and active in milieu and visible in various activities. Pt has reported some depression as well as some anxiety due to withdrawal from alcohol however pt states that she is seeing a benefit from the librium in that her anxiety is not as bad as she thought it would be. Pt does have plan to go to [facility] after discharge. Pt has received all medications without incident and has not verbalized any complaints of pain. A. Support and encouragement provided, medication education given. R. Pt verbalized understanding, safety maintained, will continue to monitor.

## 2013-03-05 NOTE — BHH Suicide Risk Assessment (Signed)
[place] INPATIENT:  Family/Significant Other Suicide Prevention Education  Suicide Prevention Education:  Education Completed; Frady Taddeo - partner [phone]),  (name of family member/significant other) has been identified by the patient as the family member/significant other with whom the patient will be residing, and identified as the person(s) who will aid the patient in the event of a mental health crisis (suicidal ideations/suicide attempt).  With written consent from the patient, the family member/significant other has been provided the following suicide prevention education, prior to the and/or following the discharge of the patient.  The suicide prevention education provided includes the following:  Suicide risk factors  Suicide prevention and interventions  National Suicide Hotline telephone number  [facility] assessment telephone number  [facility] Emergency Assistance [place] and/or Residential Mobile Crisis Unit telephone number  Request made of family/significant other to:  Remove weapons (e.g., guns, rifles, knives), all items previously/currently identified as safety concern.    Remove drugs/medications (over-the-counter, prescriptions, illicit drugs), all items previously/currently identified as a safety concern.  The family member/significant other verbalizes understanding of the suicide prevention education information provided.  The family member/significant other agrees to remove the items of safety concern listed above.  Ane Payment 03/05/2013, 10:52 AM

## 2013-03-06 LAB — TSH: TSH: 2.366 u[IU]/mL (ref 0.350–4.500)

## 2013-03-06 NOTE — BHH Group Notes (Signed)
[place] LCSW Group Therapy  03/06/2013  1:15 PM   Type of Therapy:  Group Therapy  Participation Level:  Active  Participation Quality:  Appropriate, Sharing and Supportive  Affect:  Depressed and Flat  Cognitive:  Alert and Oriented  Insight:  Developing/Improving and Engaged  Engagement in Therapy:  Developing/Improving and Engaged  Modes of Intervention:  Clarification, Confrontation, Discussion, Education, Exploration, Limit-setting, Orientation, Problem-solving, Rapport Building, Art therapist, Socialization and Support  Summary of Progress/Problems: The topic for today was feelings about relapse.  Pt discussed what relapse prevention is to them and identified triggers that they are on the path to relapse.  Pt processed their feeling towards relapse and was able to relate to peers.  Pt discussed coping skills that can be used for relapse prevention.   Pt was able to process the fear behind never drinking again and how this would look for her.  Pt states that everyone around her are enablers and doesn't see how she will be able to not drink when she returns home.  Pt processed what led up to her admission, stating that it was enough to make her never drink again.  Pt states that she knows she would relapse if she were to leave the hospital today and is uncertain of what it would take for her to leave alcohol alone forever.  Pt actively participated and was engaged in group discussion.    Regan Lemming, LCSW 03/06/2013 2:14 PM

## 2013-03-06 NOTE — Progress Notes (Signed)
[facility] MD Progress Note  03/06/2013 6:31 PM Mallory Robinson  MRN:  [id number] Subjective:  Mallory Robinson continues to detox. States that she is committed to abstinence. States that looking back alcohol became the most important thing in her life. She started drinking early and continue to drink during the day. Sate that she was there with her chid but she was not really there. Most of the time she was "half drunk." She wants to get her life back together. States that there are people who she has to avoid. states that she has a group of enablers who would get alcohol for her with no hesitation. Diagnosis:   DSM5: Schizophrenia Disorders:  none Obsessive-Compulsive Disorders:  none Trauma-Stressor Disorders:  Posttraumatic Stress Disorder (309.81) Substance/Addictive Disorders:  Alcohol Related Disorder - Severe (303.90) Depressive Disorders:  Major Depressive Disorder - Moderate (296.22)  Axis I: Bipolar, Depressed  ADL's:  Intact  Sleep: Fair  Appetite:  Fair  Suicidal Ideation:  Plan:  denies Intent:  denies Means:  denies Homicidal Ideation:  Plan:  denies Intent:  denies Means:  denies AEB (as evidenced by):  Psychiatric Specialty Exam: Review of Systems  Constitutional: Positive for malaise/fatigue.  HENT: Negative.   Respiratory: Negative.   Cardiovascular: Negative.   Gastrointestinal: Negative.   Genitourinary: Negative.   Musculoskeletal: Positive for myalgias.  Skin: Negative.   Neurological: Positive for dizziness and weakness.  Endo/Heme/Allergies: Negative.   Psychiatric/Behavioral: Positive for depression and substance abuse. The patient is nervous/anxious and has insomnia.     Blood pressure 117/77, pulse 102, temperature 98.3 F (36.8 C), temperature source Oral, resp. rate 16, height 5\' 3"  (1.6 m), weight 73.483 kg (162 lb), last menstrual period 01/08/2013, SpO2 100.00%.Body mass index is 28.7 kg/(m^2).  General Appearance: Fairly Groomed  Engineer, water::  Fair   Speech:  Clear and Coherent, Slow and not spontaneous  Volume:  Decreased  Mood:  Anxious, Depressed and worried  Affect:  Restricted  Thought Process:  Coherent and Goal Directed  Orientation:  Full (Time, Place, and Person)  Thought Content:  symptoms, worries, concerns, regrets  Suicidal Thoughts:  No  Homicidal Thoughts:  No  Memory:  Immediate;   Fair Recent;   Fair Remote;   Fair  Judgement:  Fair  Insight:  Shallow  Psychomotor Activity:  Restlessness  Concentration:  Fair  Recall:  Fair  Akathisia:  No  Handed:    AIMS (if indicated):     Assets:  Desire for Improvement  Sleep:  Number of Hours: 6.5   Current Medications: Current Facility-Administered Medications  Medication Dose Route Frequency Provider Last Rate Last Dose  . acetaminophen (TYLENOL) tablet 650 mg  650 mg Oral Q6H PRN Nicholaus Bloom, MD   650 mg at 03/05/13 0900  . alum & mag hydroxide-simeth (MAALOX/MYLANTA) 200-200-20 MG/5ML suspension 30 mL  30 mL Oral Q4H PRN Nicholaus Bloom, MD      . chlordiazePOXIDE (LIBRIUM) capsule 25 mg  25 mg Oral Q6H PRN Nicholaus Bloom, MD   25 mg at 03/04/13 2241  . chlordiazePOXIDE (LIBRIUM) capsule 25 mg  25 mg Oral BH-qamhs Nicholaus Bloom, MD   25 mg at 03/06/13 0858   Followed by  . [START ON 03/07/2013] chlordiazePOXIDE (LIBRIUM) capsule 25 mg  25 mg Oral Daily Nicholaus Bloom, MD      . docusate sodium (COLACE) capsule 100 mg  100 mg Oral BID Nicholaus Bloom, MD   100 mg at 03/06/13 1715  .  ferrous sulfate tablet 325 mg  325 mg Oral BID WC Nicholaus Bloom, MD   325 mg at 03/06/13 1715  . hydrOXYzine (ATARAX/VISTARIL) tablet 25 mg  25 mg Oral Q6H PRN Nicholaus Bloom, MD      . loperamide (IMODIUM) capsule 2-4 mg  2-4 mg Oral PRN Nicholaus Bloom, MD      . magnesium hydroxide (MILK OF MAGNESIA) suspension 30 mL  30 mL Oral Daily PRN Nicholaus Bloom, MD      . multivitamin with minerals tablet 1 tablet  1 tablet Oral Daily Nicholaus Bloom, MD   1 tablet at 03/06/13 0857  .  neomycin-bacitracin-polymyxin (NEOSPORIN) ointment   Topical PRN Nicholaus Bloom, MD      . ondansetron (ZOFRAN-ODT) disintegrating tablet 4 mg  4 mg Oral Q6H PRN Nicholaus Bloom, MD   4 mg at 03/04/13 1831  . polyethylene glycol (MIRALAX / GLYCOLAX) packet 17 g  17 g Oral Daily Nicholaus Bloom, MD   17 g at 03/06/13 [phone]  . QUEtiapine (SEROQUEL) tablet 100 mg  100 mg Oral QHS Nicholaus Bloom, MD   100 mg at 03/05/13 2124  . thiamine (VITAMIN B-1) tablet 100 mg  100 mg Oral Daily Nicholaus Bloom, MD   100 mg at 03/06/13 0858  . topiramate (TOPAMAX) tablet 25 mg  25 mg Oral Daily Nicholaus Bloom, MD   25 mg at 03/06/13 0858  . traZODone (DESYREL) tablet 50 mg  50 mg Oral QHS PRN Nicholaus Bloom, MD   50 mg at 03/04/13 2241    Lab Results:  Results for orders placed during the hospital encounter of 03/04/13 (from the past 48 hour(s))  TSH     Status: None   Collection Time    03/05/13  7:57 PM      Result Value Range   TSH 2.366  0.350 - 4.500 uIU/mL   Comment: Performed at [facility]     Status: Abnormal   Collection Time    03/05/13  7:57 PM      Result Value Range   Total Protein 7.9  6.0 - 8.3 g/dL   Albumin 4.2  3.5 - 5.2 g/dL   AST 29  0 - 37 U/L   ALT 21  0 - 35 U/L   Alkaline Phosphatase 63  39 - 117 U/L   Total Bilirubin 0.2 (*) 0.3 - 1.2 mg/dL   Bilirubin, Direct <0.2  0.0 - 0.3 mg/dL   Indirect Bilirubin NOT CALCULATED  0.3 - 0.9 mg/dL   Comment: Performed at [facility]    Physical Findings: AIMS:  , ,  ,  ,    CIWA:  CIWA-Ar Total: 0 COWS:     Treatment Plan Summary: Daily contact with patient to assess and evaluate symptoms and progress in treatment Medication management  Plan: Supportive approach/coping skills/relapse prevention           CBT;mindfulness           Reassess and address the co morbidities           Optimize treatment with psychotropics Medical Decision Making Problem Points:  Review of psycho-social  stressors (1) Data Points:  Review of medication regiment & side effects (2)  I certify that inpatient services furnished can reasonably be expected to improve the patient's condition.   Alyzabeth Pontillo A 03/06/2013, 6:31 PM

## 2013-03-06 NOTE — Progress Notes (Signed)
[place] Group Notes:  (Nursing/MHT/Case Management/Adjunct)  Date:  03/05/2013  Time:  2100  Type of Therapy:  wrap up group  Participation Level:  Active  Participation Quality:  Appropriate, Attentive, Sharing and Supportive  Affect:  Flat  Cognitive:  Appropriate  Insight:  Improving  Engagement in Group:  Engaged  Modes of Intervention:  Clarification, Education and Support  Summary of Progress/Problems: Pt reported hearing a story that was so similar to her own earlier in group that it made her feel uncomfortable.  Pt shares that she stayed and listened but it was very hard for her to do so.  Pt was told that stories like that can make you feel like you are not alone with these experiences but they can also make you realize your own problems and bring you out of denial sometimes. Pt shares that she has been at home drinking and cutting and wants to get to a place where this never happens again.  Pt also shares that she has a lot of enablers around her and that they will have to go.   Jacques Navy 03/06/2013, 3:47 AM

## 2013-03-06 NOTE — Progress Notes (Signed)
Recreation Therapy Notes  Date: 01.16.2015 Time: 3:00pm Location: [address] Day[address]  Group Topic: Leisure Education  Goal Area(s) Addresses:  Patient will identify positive leisure activities.  Patient will identify one positive benefit of participation in leisure activities.   Behavioral Response: Did not attend.   Laureen Ochs Chrislyn Seedorf, LRT/CTRS  Lane Hacker 03/06/2013 3:53 PM

## 2013-03-06 NOTE — Tx Team (Signed)
Interdisciplinary Treatment Plan Update (Adult)  Date: 03/06/2013  Time Reviewed:  9:45 AM  Progress in Treatment: Attending groups: Yes Participating in groups:  Yes Taking medication as prescribed:  Yes Tolerating medication:  Yes Family/Significant othe contact made: CSW assessing Patient understands diagnosis:  Yes Discussing patient identified problems/goals with staff:  Yes Medical problems stabilized or resolved:  Yes Denies suicidal/homicidal ideation: Yes Issues/concerns per patient self-inventory:  Yes Other:  New problem(s) identified: N/A  Discharge Plan or Barriers: CSW assessing for appropriate referrals.    Reason for Continuation of Hospitalization: Anxiety Depression Detox Medication Stabilization  Comments: N/A  Estimated length of stay: 3-5 days  For review of initial/current patient goals, please see plan of care.  Attendees: Patient:     Family:     Physician:  Dr. Sabra Heck 03/06/2013 10:07 AM   Nursing:   Marilynne Halsted, RN 03/06/2013 10:07 AM   Clinical Social Worker:  Regan Lemming, LCSW 03/06/2013 10:07 AM   Other: Lars Pinks, RN case manager 03/06/2013 10:07 AM   Other:  Maxie Better, [place] 03/06/2013 10:07 AM   Other:  Agustina Caroli, NP 03/06/2013 10:07 AM   Other:  Doroteo Bradford, RN   Other:    Other:    Other:    Other:    Other:    Other:     Scribe for Treatment Team:   Ane Payment, 03/06/2013 , 10:07 AM

## 2013-03-06 NOTE — BHH Group Notes (Signed)
[facility] LCSW After[facility] Discharge Planning Group Note   03/06/2013 8:45 AM  Participation Quality:  Alert, Appropriate and Oriented  Mood/Affect:  Flat and Depressed  Depression Rating:  6  Anxiety Rating:  6  Thoughts of Suicide:  Pt denies SI/HI  Will you contract for safety?   Yes  Current AVH:  Pt denies  Plan for Discharge/Comments:  Pt attended discharge planning group and actively participated in group.  CSW provided pt with today's workbook.  Pt reports not doing well today, feeling down.  Pt came to the hospital for alcohol detox, with a long history of alcohol abuse.  Pt wants to follow up with further inpatient treatment.  CSW referred pt to [facility] but the next available bed date is 1/26.  CSW will continue to monitor this referral, to check if a bed opens up earlier.  Pt can return home in [place] but would prefer to go straight to treatment.  No further needs voiced by pt at this time.    Transportation Means: Pt reports access to transportation - partner will pick pt up  Supports: Pt states her partner is very supportive  Regan Lemming, LCSW 03/06/2013 9:44 AM

## 2013-03-06 NOTE — Progress Notes (Signed)
D: Patient denies SI/HI and A/V hallucinations; patient reports sleep is well; reports appetite is improving ; reports energy level is low ; reports ability to pay attention is good; rates depression as 6/10; rates hopelessness 6/10; patient reporting cravings  A: Monitored q 15 minutes; patient encouraged to attend groups; patient educated about medications; patient given medications per physician orders; patient encouraged to express feelings and/or concerns  R: Patient is cooperative and very minimal; patient is flat and blunted; patient forwards little information; patient is appropriate; patient's interaction with staff and peers is appropriate; patient was able to set goal to talk with staff 1:1 when having feelings of SI; patient is taking medications as prescribed and tolerating medications; patient is attending all groups

## 2013-03-07 NOTE — BHH Group Notes (Signed)
[place] Group Notes:  (Nursing/MHT/Case Management/Adjunct)  Date:  03/07/2013  Time:  0900 am  Type of Therapy:  Psychoeducational Skills  Participation Level:  Minimal  Participation Quality:  Resistant  Affect:  Appropriate  Cognitive:  Alert  Insight:  Limited  Engagement in Group:  Resistant  Modes of Intervention:  Support  Summary of Progress/Problems:  Mallory Robinson 03/07/2013, 12:32 PM

## 2013-03-07 NOTE — Progress Notes (Signed)
D: pt was agitated earlier this evening because her wedding ring could not be found. Pt stated her wife had brought the ring in, and it never made it back to her. Pt was tearful when talking with Probation officer. Writer let charge nurse and ac aware of the situation. Wife later called pt and let her know that the ring was found in the driveway. Pt denies si/hi/avh. Pt c/o of minor headache and feeling a little achy.  A: q 15 min safety checks. scheduled medication given R: pt remains safe on unit. No further complaints at this time

## 2013-03-07 NOTE — BHH Group Notes (Signed)
[place] Group Notes:  (Nursing/MHT/Case Management/Adjunct)  Date:  03/07/2013  Time:  11:07 AM  Type of Therapy:  Psychoeducational Skills  Participation Level:  Active  Participation Quality:  Attentive  Affect:  Appropriate  Cognitive:  Oriented  Insight:  Appropriate  Engagement in Group:  Engaged  Modes of Intervention:  Clarification, Discussion, Education, Problem-solving, Socialization and Support  Summary of Progress/Problems:Focus on workbook and healthy coping skills.   Orlin Hilding 03/07/2013, 11:07 AM

## 2013-03-07 NOTE — Progress Notes (Signed)
Patient did attend the evening speaker AA meeting.  

## 2013-03-07 NOTE — Progress Notes (Signed)
Patient ID: Mallory Robinson, female   DOB: 11-Feb-1976, 38 y.o.   MRN: [id number] [facility] MD Progress Note  03/07/2013 11:17 AM Mallory Robinson  MRN:  [id number] Subjective:  Mallory Robinson continues to detox and she is committed to abstinence. Talking about how to keep sober long term and right choices. Not hopeless. Vitals stable.  Less anxious and attending groups.  Diagnosis:   DSM5: Schizophrenia Disorders:  none Obsessive-Compulsive Disorders:  none Trauma-Stressor Disorders:  Posttraumatic Stress Disorder (309.81) Substance/Addictive Disorders:  Alcohol Related Disorder - Severe (303.90) Depressive Disorders:  Major Depressive Disorder - Moderate (296.22)  Axis I: Bipolar, Depressed  ADL's:  Intact  Sleep: Fair  Appetite:  Fair  Suicidal Ideation:  Plan:  denies Intent:  denies Means:  denies Homicidal Ideation:  Plan:  denies Intent:  denies Means:  denies AEB (as evidenced by):  Psychiatric Specialty Exam: Review of Systems  Constitutional: Positive for malaise/fatigue.  HENT: Negative.   Respiratory: Negative.   Cardiovascular: Negative.   Gastrointestinal: Negative.   Genitourinary: Negative.   Musculoskeletal: Positive for myalgias.  Skin: Negative.   Neurological: Positive for dizziness and weakness.  Endo/Heme/Allergies: Negative.   Psychiatric/Behavioral: Positive for depression and substance abuse. The patient is nervous/anxious and has insomnia.     Blood pressure 114/77, pulse 86, temperature 97.9 F (36.6 C), temperature source Oral, resp. rate 18, height 5\' 3"  (1.6 m), weight 73.483 kg (162 lb), last menstrual period 01/08/2013, SpO2 100.00%.Body mass index is 28.7 kg/(m^2).  General Appearance: Fairly Groomed  Engineer, water::  Fair  Speech:  Clear and Coherent, Slow and not spontaneous  Volume:  Decreased  Mood:  Anxious, Depressed and worried  Affect:  Restricted  Thought Process:  Coherent and Goal Directed  Orientation:  Full (Time, Place, and Person)  Thought  Content:  symptoms, worries, concerns, regrets  Suicidal Thoughts:  No  Homicidal Thoughts:  No  Memory:  Immediate;   Fair Recent;   Fair Remote;   Fair  Judgement:  Fair  Insight:  Shallow  Psychomotor Activity:  Restlessness  Concentration:  Fair  Recall:  Fair  Akathisia:  No  Handed:    AIMS (if indicated):     Assets:  Desire for Improvement  Sleep:  Number of Hours: 6.5   Current Medications: Current Facility-Administered Medications  Medication Dose Route Frequency Provider Last Rate Last Dose  . acetaminophen (TYLENOL) tablet 650 mg  650 mg Oral Q6H PRN Nicholaus Bloom, MD   650 mg at 03/05/13 0900  . alum & mag hydroxide-simeth (MAALOX/MYLANTA) 200-200-20 MG/5ML suspension 30 mL  30 mL Oral Q4H PRN Nicholaus Bloom, MD      . chlordiazePOXIDE (LIBRIUM) capsule 25 mg  25 mg Oral Q6H PRN Nicholaus Bloom, MD   25 mg at 03/04/13 2241  . docusate sodium (COLACE) capsule 100 mg  100 mg Oral BID Nicholaus Bloom, MD   100 mg at 03/07/13 2585  . ferrous sulfate tablet 325 mg  325 mg Oral BID WC Nicholaus Bloom, MD   325 mg at 03/07/13 0813  . hydrOXYzine (ATARAX/VISTARIL) tablet 25 mg  25 mg Oral Q6H PRN Nicholaus Bloom, MD      . loperamide (IMODIUM) capsule 2-4 mg  2-4 mg Oral PRN Nicholaus Bloom, MD      . magnesium hydroxide (MILK OF MAGNESIA) suspension 30 mL  30 mL Oral Daily PRN Nicholaus Bloom, MD      . multivitamin with minerals tablet 1  tablet  1 tablet Oral Daily Nicholaus Bloom, MD   1 tablet at 03/07/13 5053  . neomycin-bacitracin-polymyxin (NEOSPORIN) ointment   Topical PRN Nicholaus Bloom, MD      . ondansetron (ZOFRAN-ODT) disintegrating tablet 4 mg  4 mg Oral Q6H PRN Nicholaus Bloom, MD   4 mg at 03/04/13 1831  . polyethylene glycol (MIRALAX / GLYCOLAX) packet 17 g  17 g Oral Daily Nicholaus Bloom, MD   17 g at 03/07/13 [phone]  . QUEtiapine (SEROQUEL) tablet 100 mg  100 mg Oral QHS Nicholaus Bloom, MD   100 mg at 03/06/13 2132  . thiamine (VITAMIN B-1) tablet 100 mg  100 mg Oral Daily Nicholaus Bloom, MD   100 mg at 03/07/13 3419  . topiramate (TOPAMAX) tablet 25 mg  25 mg Oral Daily Nicholaus Bloom, MD   25 mg at 03/07/13 0813  . traZODone (DESYREL) tablet 50 mg  50 mg Oral QHS PRN Nicholaus Bloom, MD   50 mg at 03/04/13 2241    Lab Results:  Results for orders placed during the hospital encounter of 03/04/13 (from the past 48 hour(s))  TSH     Status: None   Collection Time    03/05/13  7:57 PM      Result Value Range   TSH 2.366  0.350 - 4.500 uIU/mL   Comment: Performed at [facility]     Status: Abnormal   Collection Time    03/05/13  7:57 PM      Result Value Range   Total Protein 7.9  6.0 - 8.3 g/dL   Albumin 4.2  3.5 - 5.2 g/dL   AST 29  0 - 37 U/L   ALT 21  0 - 35 U/L   Alkaline Phosphatase 63  39 - 117 U/L   Total Bilirubin 0.2 (*) 0.3 - 1.2 mg/dL   Bilirubin, Direct <0.2  0.0 - 0.3 mg/dL   Indirect Bilirubin NOT CALCULATED  0.3 - 0.9 mg/dL   Comment: Performed at [facility]    Physical Findings: AIMS: Facial and Oral Movements Muscles of Facial Expression: None, normal Lips and Perioral Area: None, normal Jaw: None, normal Tongue: None, normal,Extremity Movements Upper (arms, wrists, hands, fingers): None, normal Lower (legs, knees, ankles, toes): None, normal, Trunk Movements Neck, shoulders, hips: None, normal, Overall Severity Severity of abnormal movements (highest score from questions above): None, normal Incapacitation due to abnormal movements: None, normal Patient's awareness of abnormal movements (rate only patient's report): No Awareness, Dental Status Current problems with teeth and/or dentures?: No Does patient usually wear dentures?: No  CIWA:  CIWA-Ar Total: 0 COWS:     Treatment Plan Summary: Daily contact with patient to assess and evaluate symptoms and progress in treatment Medication management  Plan: Supportive approach/coping skills/relapse prevention           CBT;mindfulness            Reassess and address the co morbidities           Optimize treatment with psychotropics Medical Decision Making Problem Points:  Review of psycho-social stressors (1) Data Points:  Review of medication regiment & side effects (2)  I certify that inpatient services furnished can reasonably be expected to improve the patient's condition.   Viera Okonski 03/07/2013, 11:17 AM

## 2013-03-07 NOTE — Progress Notes (Signed)
03-07-13  NSG NOTE  7a-7p  D: Affect is anxious and depressed.  Mood is depressed.  Behavior is cooperative with encouragement, direction and support.  Interacts appropriately with peers and staff.  Continues to complain of occasional chilling and ongoing constipation.  Reports fair sleep and improving appetite.  Expressed low energy levels, prune juice to be given to pt for constipation.   Worked on workbook on healthy coping skills.  A:  Medications per MD order.  Support given throughout day.  1:1 time spent with pt.  R:  Following treatment plan.  Denies HI/SI, auditory or visual hallucinations.  Contracts for safety.

## 2013-03-07 NOTE — BHH Group Notes (Signed)
[place] Group Notes:  (Nursing/MHT/Case Management/Adjunct)  Date:  03/07/2013  Time:  1:15 pm  Type of Therapy:  Psychoeducational Skills  Participation Level:  Did Not Attend   Mallory Robinson 03/07/2013, 2:37 PM

## 2013-03-07 NOTE — Progress Notes (Signed)
D.  Pt. Has flat affect, depressed mood.  Denies SI/HI and denies A/V hallucinations.  Attending groups and interacting appropriately with peers and staff.  Reports small bowel movement. A.  Encouragement and support given. R.  Pt. Remains.

## 2013-03-07 NOTE — [facility] Group Notes (Signed)
[facility] Group Notes:  (Clinical Social Work)  03/07/2013     10-11AM  Summary of Progress/Problems:   The main focus of today's process group was for the patient to identify ways in which they have in the past sabotaged their own recovery. Motivational Interviewing and a worksheet were utilized to help patients explore the perceived benefits and costs of their substance use, as well as the potential benefits and costs of stopping.  The Stages of Change were explained using a handout, and patients identified where they currently are with regard to stages of change.  The patient expressed that if the use of substances were stopped, she would expect to feel numb/blank.    Type of Therapy:  Group Therapy - Process   Participation Level:  Active  Participation Quality:  Attentive  Affect:  Blunted and Depressed  Cognitive:  Oriented  Insight:  Engaged  Engagement in Therapy:  Engaged  Modes of Intervention:  Education, Psychiatric nurse, Motivational Interviewing  Selmer Dominion, LCSW 03/07/2013, 12:28 PM

## 2013-03-07 NOTE — Progress Notes (Signed)
Adult Psychoeducational Group Note  Date:  03/07/2013 Time:  6:58 PM  Group Topic/Focus:  Making Healthy Choices:   The focus of this group is to help patients identify negative/unhealthy choices they were using prior to admission and identify positive/healthier coping strategies to replace them upon discharge.  Participation Level:  Active  Participation Quality:  Appropriate and Attentive  Affect:  Appropriate  Cognitive:  Alert and Appropriate  Insight: Appropriate and Good  Engagement in Group:  Engaged  Modes of Intervention:  Activity  Additional Comments:  Patient participated fully in today's Psycho-Education group. Patient shared verbally with the group, then actively participated in a game of "Healthy Coping Skills" [place].   Mallory Robinson 03/07/2013, 6:58 PM

## 2013-03-07 NOTE — Progress Notes (Signed)
Pt attended AA group this evening.  

## 2013-03-08 MED ORDER — METHOCARBAMOL 500 MG PO TABS
500.0000 mg | ORAL_TABLET | Freq: Once | ORAL | Status: AC
  Administered 2013-03-08: 500 mg via ORAL
  Filled 2013-03-08 (×2): qty 1

## 2013-03-08 NOTE — [facility] Group Notes (Signed)
[facility] Group Notes:  (Clinical Social Work)  03/08/2013  10:00-11:00AM  Summary of Progress/Problems:   The main focus of today's process group was to   identify the patient's current support system and decide on other supports that can be put in place.  The picture on workbook was used to discuss why additional supports are neededt.  An emphasis was placed on using counselor, doctor, therapy groups, 12-step groups, and problem-specific support groups to expand supports.   There was also an extensive discussion about what constitutes a healthy support versus an unhealthy support.  The patient expressed full comprehension of the concepts presented, and agreed that there is a need to add more supports.  She is looking forward to going to [facility], but is nervous about going to 12-step programs at discharge, as well as getting a sponsor.  Other patients spoke at length about their experiences and made suggestions, which the patient found encouraging.  Type of Therapy:  Process Group with Motivational Interviewing  Participation Level:  Active  Participation Quality:  Attentive and Sharing  Affect:  Anxious and Depressed  Cognitive:  Oriented  Insight:  Engaged  Engagement in Therapy:  Engaged  Modes of Intervention:   Education, Support and Processing, Activity  [employer], LCSW 03/08/2013, 12:15pm

## 2013-03-08 NOTE — Progress Notes (Signed)
Adult Psychoeducational Group Note  Date:  03/08/2013 Time:  6:41 PM  Group Topic/Focus:  Dimensions of Wellness:   The focus of this group is to introduce the topic of wellness and discuss the role each dimension of wellness plays in total health.  Participation Level:  Did Not Attend   Mallory Robinson 03/08/2013, 6:41 PM

## 2013-03-08 NOTE — Progress Notes (Signed)
Mallory Robinson is out in the milieu interacting with peers and attending groups.  She reports "fair" sleep "low" energy and "improving" appetite. Rates depression and hopelessness at 4. Denies SI. A- Support and encouragement offered. Continue current POC and evaluation of treatment goals.  Continue 15' checks for safety.  R- Safety maintained.

## 2013-03-08 NOTE — Progress Notes (Signed)
Patient ID: Mallory Robinson, female   DOB: Feb 05, 1976, 38 y.o.   MRN: [id number] [facility] MD Progress Note  03/08/2013 11:05 AM Mallory Robinson  MRN:  [id number] Subjective:  Naje continues to detox and she is committed to abstinence. Somewhat concerned about another patient in the hall. Discussing her concerns in groups and developing coping skills.  Diagnosis:   DSM5: Schizophrenia Disorders:  none Obsessive-Compulsive Disorders:  none Trauma-Stressor Disorders:  Posttraumatic Stress Disorder (309.81) Substance/Addictive Disorders:  Alcohol Related Disorder - Severe (303.90) Depressive Disorders:  Major Depressive Disorder - Moderate (296.22)  Axis I: Bipolar, Depressed  ADL's:  Intact  Sleep: Fair  Appetite:  Fair  Suicidal Ideation:  Plan:  denies Intent:  denies Means:  denies Homicidal Ideation:  Plan:  denies Intent:  denies Means:  denies AEB (as evidenced by):  Psychiatric Specialty Exam: Review of Systems  Constitutional: Positive for malaise/fatigue.  HENT: Negative.   Respiratory: Negative.   Cardiovascular: Negative.   Gastrointestinal: Negative.   Genitourinary: Negative.   Musculoskeletal: Positive for myalgias.  Skin: Negative.   Neurological: Positive for dizziness and weakness.  Endo/Heme/Allergies: Negative.   Psychiatric/Behavioral: Positive for depression and substance abuse. The patient is nervous/anxious and has insomnia.     Blood pressure 130/86, pulse 125, temperature 98 F (36.7 C), temperature source Oral, resp. rate 20, height 5\' 3"  (1.6 m), weight 73.483 kg (162 lb), last menstrual period 01/08/2013, SpO2 100.00%.Body mass index is 28.7 kg/(m^2).  General Appearance: Fairly Groomed  Engineer, water::  Fair  Speech:  Clear and Coherent, Slow and not spontaneous  Volume:  Decreased  Mood:  Anxious, Depressed and worried  Affect:  Restricted  Thought Process:  Coherent and Goal Directed  Orientation:  Full (Time, Place, and Person)  Thought  Content:  symptoms, worries, concerns, regrets  Suicidal Thoughts:  No  Homicidal Thoughts:  No  Memory:  Immediate;   Fair Recent;   Fair Remote;   Fair  Judgement:  Fair  Insight:  Shallow  Psychomotor Activity:  Restlessness  Concentration:  Fair  Recall:  Fair  Akathisia:  No  Handed:    AIMS (if indicated):     Assets:  Desire for Improvement  Sleep:  Number of Hours: 6.25   Current Medications: Current Facility-Administered Medications  Medication Dose Route Frequency Provider Last Rate Last Dose  . acetaminophen (TYLENOL) tablet 650 mg  650 mg Oral Q6H PRN Nicholaus Bloom, MD   650 mg at 03/05/13 0900  . alum & mag hydroxide-simeth (MAALOX/MYLANTA) 200-200-20 MG/5ML suspension 30 mL  30 mL Oral Q4H PRN Nicholaus Bloom, MD      . docusate sodium (COLACE) capsule 100 mg  100 mg Oral BID Nicholaus Bloom, MD   100 mg at 03/08/13 0730  . ferrous sulfate tablet 325 mg  325 mg Oral BID WC Nicholaus Bloom, MD   325 mg at 03/08/13 0730  . magnesium hydroxide (MILK OF MAGNESIA) suspension 30 mL  30 mL Oral Daily PRN Nicholaus Bloom, MD      . multivitamin with minerals tablet 1 tablet  1 tablet Oral Daily Nicholaus Bloom, MD   1 tablet at 03/08/13 0730  . neomycin-bacitracin-polymyxin (NEOSPORIN) ointment   Topical PRN Nicholaus Bloom, MD      . polyethylene glycol (MIRALAX / GLYCOLAX) packet 17 g  17 g Oral Daily Nicholaus Bloom, MD   17 g at 03/08/13 0730  . QUEtiapine (SEROQUEL) tablet 100 mg  100  mg Oral QHS Nicholaus Bloom, MD   100 mg at 03/07/13 2149  . thiamine (VITAMIN B-1) tablet 100 mg  100 mg Oral Daily Nicholaus Bloom, MD   100 mg at 03/08/13 0730  . topiramate (TOPAMAX) tablet 25 mg  25 mg Oral Daily Nicholaus Bloom, MD   25 mg at 03/08/13 0730  . traZODone (DESYREL) tablet 50 mg  50 mg Oral QHS PRN Nicholaus Bloom, MD   50 mg at 03/04/13 2241    Lab Results:  No results found for this or any previous visit (from the past 48 hour(s)).  Physical Findings: AIMS: Facial and Oral  Movements Muscles of Facial Expression: None, normal Lips and Perioral Area: None, normal Jaw: None, normal Tongue: None, normal,Extremity Movements Upper (arms, wrists, hands, fingers): None, normal Lower (legs, knees, ankles, toes): None, normal, Trunk Movements Neck, shoulders, hips: None, normal, Overall Severity Severity of abnormal movements (highest score from questions above): None, normal Incapacitation due to abnormal movements: None, normal Patient's awareness of abnormal movements (rate only patient's report): No Awareness, Dental Status Current problems with teeth and/or dentures?: No Does patient usually wear dentures?: No  CIWA:  CIWA-Ar Total: 0 COWS:     Treatment Plan Summary: Daily contact with patient to assess and evaluate symptoms and progress in treatment Medication management  Plan: Supportive approach/coping skills/relapse prevention           CBT;mindfulness           Reassess and address the co morbidities           Optimize treatment with psychotropics if needed. She has informed the therapist of her concerns related to the other patient. Medical Decision Making Problem Points:  Review of psycho-social stressors (1) Data Points:  Review of medication regiment & side effects (2)  I certify that inpatient services furnished can reasonably be expected to improve the patient's condition.   Mallory Robinson 03/08/2013, 11:05 AM

## 2013-03-08 NOTE — Progress Notes (Signed)
Pt reports having a high level of anxiety and experiencing night sweats due to withdrawal symptoms. Pt states she is anxious due to the actions of another pt on the hall. Denies si/hi/avh. Denies pain. Pt is cooperative 1:1 time offered. q 15 min safety checks. Writer advised pt to stay away from other pt that is causing high anxiety  Pt remains safe on unit.

## 2013-03-09 MED ORDER — MAGNESIUM CITRATE PO SOLN
1.0000 | Freq: Once | ORAL | Status: AC
  Administered 2013-03-09: 1 via ORAL

## 2013-03-09 MED ORDER — HYDROXYZINE HCL 25 MG PO TABS
25.0000 mg | ORAL_TABLET | Freq: Four times a day (QID) | ORAL | Status: DC | PRN
  Administered 2013-03-09: 25 mg via ORAL
  Filled 2013-03-09: qty 1

## 2013-03-09 NOTE — Progress Notes (Signed)
D: Patient in the dayroom on approach.  Patient states she had a good day but states she was upset by another  patients actions today.  Patient states she was more anxious because of the other patients actions.  Patient complained of constipation.  Patient states she feels better and she states the cut marks on her arms are healing.  Patient denies SI/HI and denies AVH. A: Staff to monitor Q 15 mins for safety.  Encouragement and support offered.  Scheduled medications administered per orders.  Writer gave patient warmed prune juice tonight at bedtime. R: Patient remains safe on the unit.  Patient attended group tonight.  Patient visible on the unit and interacting with peers.  Patient taking administered medications.

## 2013-03-09 NOTE — Progress Notes (Signed)
Patient ID: Mallory Robinson, female   DOB: [birth year], 38 y.o.   MRN: [id number] Pt visible in the milieu.  Interacting appropriately with staff and peers.  Needs assessed.  Pt denied.  Pt rates depression as 3 and hopelessness as 4 today.  Denies SI, HI and AVH.  Support and encouragement provided.  Fifteen minute checks continue for patient safety.  Pt safe on unit.

## 2013-03-09 NOTE — Progress Notes (Signed)
D: pt sitting in dayroom watching tv. Pt stated she is having withdrawal symptoms such as anxiety, chills, night sweats, and muscle spasms. NP notified and robaxin ordered. Denies si/hi/avh. Pt is calm and cooperative. Pt stated her constant spasms are making her irritable, but nothing too serious.  A: medication given. q 15 min safety checks R: pt remains safe on unit. No further complaints at this time

## 2013-03-09 NOTE — [facility] Group Notes (Signed)
[place] LCSW Group Therapy  03/09/2013 4:36 PM  Type of Therapy:  Group Therapy  Participation Level:  Did Not Attend-pt in bed asleep   Smart, Djon Tith LCSWA  03/09/2013, 4:36 PM

## 2013-03-09 NOTE — Progress Notes (Signed)
[facility] MD Progress Note  03/09/2013 11:45 AM Mallory Robinson  MRN:  [id number] Subjective:  Depression remains, feels irritated because she is not sure where she will be going for rehab, nothing secured at this time.  Patient complained of constipation and Miralax not working, Mag Citrate ordered one time for relief. Fluids also encouraged. Sleep and appetite are fair, denies withdrawal symptoms.  Diagnosis:   DSM5: Schizophrenia Disorders:  none Obsessive-Compulsive Disorders:  none Trauma-Stressor Disorders:  Posttraumatic Stress Disorder (309.81) Substance/Addictive Disorders:  Alcohol Related Disorder - Severe (303.90) Depressive Disorders:  Major Depressive Disorder - Moderate (296.22)  Axis I: Bipolar, Depressed  ADL's:  Intact  Sleep: Fair  Appetite:  Fair  Suicidal Ideation:  Plan:  denies Intent:  denies Means:  denies Homicidal Ideation:  Plan:  denies Intent:  denies Means:  denies AEB (as evidenced by):  Psychiatric Specialty Exam: Review of Systems  Constitutional: Positive for malaise/fatigue.  HENT: Negative.   Respiratory: Negative.   Cardiovascular: Negative.   Gastrointestinal: Negative.   Genitourinary: Negative.   Musculoskeletal: Positive for myalgias.  Skin: Negative.   Neurological: Positive for dizziness and weakness.  Endo/Heme/Allergies: Negative.   Psychiatric/Behavioral: Positive for depression and substance abuse. The patient is nervous/anxious and has insomnia.     Blood pressure 117/86, pulse 90, temperature 98.2 F (36.8 C), temperature source Oral, resp. rate 20, height 5\' 3"  (1.6 m), weight 73.483 kg (162 lb), last menstrual period 01/08/2013, SpO2 100.00%.Body mass index is 28.7 kg/(m^2).  General Appearance: Fairly Groomed  Engineer, water::  Fair  Speech:  Clear and Coherent, Slow and not spontaneous  Volume:  Decreased  Mood:  Anxious, Depressed and worried  Affect:  Restricted  Thought Process:  Coherent and Goal Directed   Orientation:  Full (Time, Place, and Person)  Thought Content:  symptoms, worries, concerns, regrets  Suicidal Thoughts:  No  Homicidal Thoughts:  No  Memory:  Immediate;   Fair Recent;   Fair Remote;   Fair  Judgement:  Fair  Insight:  Shallow  Psychomotor Activity:  Restlessness  Concentration:  Fair  Recall:  Fair  Akathisia:  No  Handed:    AIMS (if indicated):     Assets:  Desire for Improvement  Sleep:  Number of Hours: 6.25   Current Medications: Current Facility-Administered Medications  Medication Dose Route Frequency Provider Last Rate Last Dose  . acetaminophen (TYLENOL) tablet 650 mg  650 mg Oral Q6H PRN Nicholaus Bloom, MD   650 mg at 03/05/13 0900  . alum & mag hydroxide-simeth (MAALOX/MYLANTA) 200-200-20 MG/5ML suspension 30 mL  30 mL Oral Q4H PRN Nicholaus Bloom, MD      . docusate sodium (COLACE) capsule 100 mg  100 mg Oral BID Nicholaus Bloom, MD   100 mg at 03/09/13 0837  . ferrous sulfate tablet 325 mg  325 mg Oral BID WC Nicholaus Bloom, MD   325 mg at 03/09/13 0623  . magnesium citrate solution 1 Bottle  1 Bottle Oral Once Waylan Boga, NP      . magnesium hydroxide (MILK OF MAGNESIA) suspension 30 mL  30 mL Oral Daily PRN Nicholaus Bloom, MD      . multivitamin with minerals tablet 1 tablet  1 tablet Oral Daily Nicholaus Bloom, MD   1 tablet at 03/09/13 7628  . neomycin-bacitracin-polymyxin (NEOSPORIN) ointment   Topical PRN Nicholaus Bloom, MD      . polyethylene glycol (MIRALAX / GLYCOLAX) packet 17 g  17 g Oral Daily Nicholaus Bloom, MD   17 g at 03/09/13 [phone]  . QUEtiapine (SEROQUEL) tablet 100 mg  100 mg Oral QHS Nicholaus Bloom, MD   100 mg at 03/08/13 2123  . thiamine (VITAMIN B-1) tablet 100 mg  100 mg Oral Daily Nicholaus Bloom, MD   100 mg at 03/09/13 [phone]  . topiramate (TOPAMAX) tablet 25 mg  25 mg Oral Daily Nicholaus Bloom, MD   25 mg at 03/09/13 0837  . traZODone (DESYREL) tablet 50 mg  50 mg Oral QHS PRN Nicholaus Bloom, MD   50 mg at 03/04/13 2241    Lab Results:   No results found for this or any previous visit (from the past 48 hour(s)).  Physical Findings: AIMS: Facial and Oral Movements Muscles of Facial Expression: None, normal Lips and Perioral Area: None, normal Jaw: None, normal Tongue: None, normal,Extremity Movements Upper (arms, wrists, hands, fingers): None, normal Lower (legs, knees, ankles, toes): None, normal, Trunk Movements Neck, shoulders, hips: None, normal, Overall Severity Severity of abnormal movements (highest score from questions above): None, normal Incapacitation due to abnormal movements: None, normal Patient's awareness of abnormal movements (rate only patient's report): No Awareness, Dental Status Current problems with teeth and/or dentures?: No Does patient usually wear dentures?: No  CIWA:  CIWA-Ar Total: 0 COWS:     Treatment Plan Summary: Daily contact with patient to assess and evaluate symptoms and progress in treatment Medication management  Plan:  Review of chart, vital signs, medications, and notes. 1-Individual and group therapy 2-Medication management for depression and anxiety:  Medications reviewed with the patient and Mag Citrate ordered for constipation 3-Coping skills for depression, anxiety, and alcohol use 4-Continue crisis stabilization and management 5-Address health issues--monitoring vital signs, stable 6-Treatment plan in progress to prevent relapse of depression, alcohol use, and anxiety  Medical Decision Making Problem Points:  Review of psycho-social stressors (1) Data Points:  Review of medication regiment & side effects (2)  I certify that inpatient services furnished can reasonably be expected to improve the patient's condition.   Waylan Boga, [place] 03/09/2013, 11:45 AM

## 2013-03-09 NOTE — [facility] Group Notes (Signed)
[facility] LCSW Aftercare Discharge Planning Group Note   03/09/2013 10:54 AM  Participation Quality:  Appropriate   Mood/Affect:  Depressed and Flat  Depression Rating:  0  Anxiety Rating:  0  Thoughts of Suicide:  No Will you contract for safety?   NA  Current AVH:  No  Plan for Discharge/Comments:  Pt aware of [facility] admission date of 1/26 and plans to follow up at [facility] for med management. She is hoping this date will be moved up-CSW notified pt that [facility] Pester will be called tomorrow (when open) to see if any spots have opened up for sooner than 1/26.   Transportation Means: bus or partner   Supports: partner/limited family supports   Proofreader, Research officer, trade union

## 2013-03-09 NOTE — Progress Notes (Signed)
Adult Psychoeducational Group Note  Date:  03/09/2013 Time:  1:23 PM  Group Topic/Focus:  Self Care:   The focus of this group is to help patients understand the importance of self-care in order to improve or restore emotional, physical, spiritual, interpersonal, and financial health.  Participation Level:  Minimal  Participation Quality:  Appropriate  Affect:  Depressed and Flat  Cognitive:  Appropriate  Insight: Improving  Engagement in Group:  Developing/Improving and Supportive  Modes of Intervention:  Clarification, Discussion, Education, Problem-solving and Support  Additional Comments:  Pt did not participate in group discussion.  Lynder Parents [facility] 03/09/2013, 1:23 PM

## 2013-03-09 NOTE — Progress Notes (Signed)
Recreation Therapy Notes  Date: 01.19.2015 Time: 3:00pm Location: [address] Dayroom   Group Topic: Coping Skills  Goal Area(s) Addresses:  Patient will identify benefit of using coping skill.  Patient will identify ability to positively change life by using coping skills.   Behavioral Response: Did not attend.   Laureen Ochs Micah Galeno, LRT/CTRS  Aella Ronda L 03/09/2013 4:40 PM

## 2013-03-09 NOTE — Progress Notes (Signed)
Patient did attend the evening speaker AA meeting.  

## 2013-03-10 MED ORDER — DSS 100 MG PO CAPS: 100.0000 mg | ORAL_CAPSULE | Freq: Two times a day (BID) | ORAL | Status: DC

## 2013-03-10 MED ORDER — POLYETHYLENE GLYCOL 3350 17 G PO PACK: 17.0000 g | PACK | Freq: Every day | ORAL | Status: DC

## 2013-03-10 MED ORDER — QUETIAPINE FUMARATE 100 MG PO TABS: 100.0000 mg | ORAL_TABLET | Freq: Every day | ORAL | Status: DC

## 2013-03-10 MED ORDER — TOPIRAMATE 25 MG PO TABS: 25.0000 mg | ORAL_TABLET | Freq: Every day | ORAL | Status: DC

## 2013-03-10 MED ORDER — TRAZODONE HCL 50 MG PO TABS: 50.0000 mg | ORAL_TABLET | Freq: Every evening | ORAL | Status: DC | PRN

## 2013-03-10 MED ORDER — FERROUS SULFATE 325 (65 FE) MG PO TABS: 325.0000 mg | ORAL_TABLET | Freq: Two times a day (BID) | ORAL | Status: DC

## 2013-03-10 NOTE — Progress Notes (Signed)
(  DIscharging early tomorrow morning) [facility] Adult Case Management Discharge Plan :  Will you be returning to the same living situation after discharge: Yes,  can return home after completing treatment At discharge, do you have transportation home?:Yes,  partner is pick pt up tomorrow morning and transporting pt to [place] you have the ability to pay for your medications:Yes,  provided samples and prescription.  Referred to [facility] for assistance with affording meds  Release of information consent forms completed and in the chart;  Patient's signature needed at discharge.  Patient to Follow up at: Follow-up Information   Follow up with [facility] On 03/11/2013. (Arrive at 8:00 am for screening and possible admission, for further inpatient treatment.  Brind ID, 30 day supply of meds and belongings)    Contact information:   [address] [place], [place] [address] Phone: [phone] Fax: [phone]      Follow up with [facility] (Walk in for hospital discharge appointment, for outpatient medication management and therapy. Walk in clinic is Monday - Friday 8 am - 3 pm)    Contact information:   [address], [place] [address] Phone: [phone] Fax: [phone]      Patient denies SI/HI:   Yes,  denies SI/HI    Safety Planning and Suicide Prevention discussed:  Yes,  disucssed with pt and pt's partner.  See suicide prevention education note.   Ane Payment 03/10/2013, 1:31 PM

## 2013-03-10 NOTE — [facility] Group Notes (Signed)
[facility] LCSW Group Therapy  03/10/2013  1:15 PM   Type of Therapy:  Group Therapy  Participation Level:  Active  Participation Quality:  Attentive, Sharing and Supportive  Affect:  Depressed and Flat  Cognitive:  Alert and Oriented  Insight:  Developing/Improving and Engaged  Engagement in Therapy:  Developing/Improving and Engaged  Modes of Intervention:  Activity, Clarification, Confrontation, Discussion, Education, Exploration, Limit-setting, Orientation, Problem-solving, Rapport Building, Reality Testing, Socialization and Support  Summary of Progress/Problems: Patient was attentive and engaged with speaker from [employer].  Patient was attentive to speaker while they shared their story of dealing with mental health and overcoming it.  Patient expressed interest in their programs and services and received information on their agency.  Patient processed ways they can relate to the speaker.     Milley Vining Horton, LCSW 03/10/2013 1:30 PM   

## 2013-03-10 NOTE — Progress Notes (Signed)
D: Patient in the dayroom on approach.  Patient states she had a good day.  Patient states she is ready to go to [facility].  Patient states "I realize this is serious."  Patient states she has never been away from her 39 year old son  And this is hard for her.  Patient denies SI/HI and AVH. Patient states her ride will arrive promptly at 6:30 to take her to [facility] A: Staff to monitor Q 15 mins for safety.  Encouragement and support offered.  Scheduled medications administered per orders. R: Patient remains safe on the unit.  Patient attended group tonight.  Patient taking administered medications.  Patient visible on the unit and interacting with peers.

## 2013-03-10 NOTE — Progress Notes (Signed)
Patient ID: Mallory Robinson, female   DOB: [birth year], 38 y.o.   MRN: [id number] [facility] MD Progress Note  03/10/2013 5:34 PM Mallory Robinson  MRN:  [id number]  Subjective: Mallory Robinson reports, "I feel nervous about going to [facility] in the morning. I know it is necessary for me to go there. My anxiety is at #5. But I'm looking forward to going in the morning.  Diagnosis:   DSM5: Schizophrenia Disorders:  none Obsessive-Compulsive Disorders:  none Trauma-Stressor Disorders:  Posttraumatic Stress Disorder (309.81) Substance/Addictive Disorders:  Alcohol Related Disorder - Severe (303.90) Depressive Disorders:  Major Depressive Disorder - Moderate (296.22)  Axis I: Bipolar, Depressed  ADL's:  Intact  Sleep: Fair  Appetite:  Fair  Suicidal Ideation:  Plan:  denies Intent:  denies Means:  denies Homicidal Ideation:  Plan:  denies Intent:  denies Means:  denies  AEB (as evidenced by):  Psychiatric Specialty Exam: Review of Systems  Constitutional: Positive for malaise/fatigue.  HENT: Negative.   Respiratory: Negative.   Cardiovascular: Negative.   Gastrointestinal: Negative.   Genitourinary: Negative.   Musculoskeletal: Positive for myalgias.  Skin: Negative.   Neurological: Positive for dizziness and weakness.  Endo/Heme/Allergies: Negative.   Psychiatric/Behavioral: Positive for depression and substance abuse. The patient is nervous/anxious and has insomnia.     Blood pressure 115/83, pulse 90, temperature 98.4 F (36.9 C), temperature source Oral, resp. rate 18, height 5\' 3"  (1.6 m), weight 73.483 kg (162 lb), last menstrual period 01/08/2013, SpO2 100.00%.Body mass index is 28.7 kg/(m^2).  General Appearance: Fairly Groomed  Engineer, water::  Fair  Speech:  Clear and Coherent, Slow and not spontaneous  Volume:  Decreased  Mood:  Anxious, Depressed and worried  Affect:  Restricted  Thought Process:  Coherent and Goal Directed  Orientation:  Full (Time, Place, and  Person)  Thought Content:  symptoms, worries, concerns, regrets  Suicidal Thoughts:  No  Homicidal Thoughts:  No  Memory:  Immediate;   Fair Recent;   Fair Remote;   Fair  Judgement:  Fair  Insight:  Shallow  Psychomotor Activity:  Some anxiousness  Concentration:  Fair  Recall:  Fair  Akathisia:  No  Handed:    AIMS (if indicated):     Assets:  Desire for Improvement  Sleep:  Number of Hours: 6.25   Current Medications: Current Facility-Administered Medications  Medication Dose Route Frequency Provider Last Rate Last Dose  . acetaminophen (TYLENOL) tablet 650 mg  650 mg Oral Q6H PRN Nicholaus Bloom, MD   650 mg at 03/10/13 1432  . alum & mag hydroxide-simeth (MAALOX/MYLANTA) 200-200-20 MG/5ML suspension 30 mL  30 mL Oral Q4H PRN Nicholaus Bloom, MD      . docusate sodium (COLACE) capsule 100 mg  100 mg Oral BID Nicholaus Bloom, MD   100 mg at 03/10/13 1708  . ferrous sulfate tablet 325 mg  325 mg Oral BID WC Nicholaus Bloom, MD   325 mg at 03/10/13 0756  . hydrOXYzine (ATARAX/VISTARIL) tablet 25 mg  25 mg Oral Q6H PRN Encarnacion Slates, NP   25 mg at 03/09/13 1345  . magnesium hydroxide (MILK OF MAGNESIA) suspension 30 mL  30 mL Oral Daily PRN Nicholaus Bloom, MD      . multivitamin with minerals tablet 1 tablet  1 tablet Oral Daily Nicholaus Bloom, MD   1 tablet at 03/10/13 0756  . neomycin-bacitracin-polymyxin (NEOSPORIN) ointment   Topical PRN Nicholaus Bloom, MD      .  polyethylene glycol (MIRALAX / GLYCOLAX) packet 17 g  17 g Oral Daily Nicholaus Bloom, MD   17 g at 03/10/13 0755  . QUEtiapine (SEROQUEL) tablet 100 mg  100 mg Oral QHS Nicholaus Bloom, MD   100 mg at 03/09/13 2212  . thiamine (VITAMIN B-1) tablet 100 mg  100 mg Oral Daily Nicholaus Bloom, MD   100 mg at 03/10/13 0756  . topiramate (TOPAMAX) tablet 25 mg  25 mg Oral Daily Nicholaus Bloom, MD   25 mg at 03/10/13 0756  . traZODone (DESYREL) tablet 50 mg  50 mg Oral QHS PRN Nicholaus Bloom, MD   50 mg at 03/04/13 2241    Lab Results:  No  results found for this or any previous visit (from the past 48 hour(s)).  Physical Findings: AIMS: Facial and Oral Movements Muscles of Facial Expression: None, normal Lips and Perioral Area: None, normal Jaw: None, normal Tongue: None, normal,Extremity Movements Upper (arms, wrists, hands, fingers): None, normal Lower (legs, knees, ankles, toes): None, normal, Trunk Movements Neck, shoulders, hips: None, normal, Overall Severity Severity of abnormal movements (highest score from questions above): None, normal Incapacitation due to abnormal movements: None, normal Patient's awareness of abnormal movements (rate only patient's report): No Awareness, Dental Status Current problems with teeth and/or dentures?: No Does patient usually wear dentures?: No  CIWA:  CIWA-Ar Total: 0 COWS:     Treatment Plan Summary: Daily contact with patient to assess and evaluate symptoms and progress in treatment Medication management  Plan:  Review of chart, vital signs, medications, and notes. 1-Individual and group therapy 2-Medication management for depression and anxiety:  Medications reviewed with the patient and Mag Citrate ordered for constipation 3-Coping skills for depression, anxiety, and alcohol use 4-Continue crisis stabilization and management 5-Address health issues--monitoring vital signs, stable 6-Treatment plan in progress to prevent relapse of depression, alcohol use, and anxiety. 7. Discharge in am.  Medical Decision Making Problem Points:  Review of psycho-social stressors (1) Data Points:  Review of medication regiment & side effects (2)  I certify that inpatient services furnished can reasonably be expected to improve the patient's condition.   Lindell Spar I, [place] 03/10/2013, 5:34 PM

## 2013-03-10 NOTE — [facility] Suicide Risk Assessment (Signed)
Suicide Risk Assessment  Discharge Assessment     Demographic Factors:  Adolescent or young adult, Low socioeconomic status and Unemployed  Mental Status Per Nursing Assessment::   On Admission:  Self-harm thoughts;Self-harm behaviors  Current Mental Status by Physician: NA  Loss Factors: Decrease in vocational status and Financial problems/change in socioeconomic status  Historical Factors: Prior suicide attempts, Family history of mental illness or substance abuse and Impulsivity  Risk Reduction Factors:   Sense of responsibility to family, Religious beliefs about death, Positive social support, Positive therapeutic relationship and Positive coping skills or problem solving skills  Continued Clinical Symptoms:  Severe Anxiety and/or Agitation Depression:   Recent sense of peace/wellbeing Alcohol/Substance Abuse/Dependencies Unstable or Poor Therapeutic Relationship Previous Psychiatric Diagnoses and Treatments Medical Diagnoses and Treatments/Surgeries  Cognitive Features That Contribute To Risk:  Polarized thinking    Suicide Risk:  Minimal: No identifiable suicidal ideation.  Patients presenting with no risk factors but with morbid ruminations; may be classified as minimal risk based on the severity of the depressive symptoms  Discharge Diagnoses:   AXIS I:  Alcohol dependence AXIS II:  Deferred AXIS III:   Past Medical History  Diagnosis Date  . Bipolar 1 disorder   . Depression    AXIS IV:  economic problems, occupational problems, other psychosocial or environmental problems, problems related to social environment and problems with primary support group AXIS V:  61-70 mild symptoms  Plan Of Care/Follow-up recommendations:  Activity:  As tolerated Diet:  Regular  Is patient on multiple antipsychotic therapies at discharge:  No   Has Patient had three or more failed trials of antipsychotic monotherapy by history:  No  Recommended Plan for Multiple  Antipsychotic Therapies: NA  Hawthorne Day,JANARDHAHA R. 03/10/2013, 4:30 PM

## 2013-03-10 NOTE — Progress Notes (Addendum)
Pt did attend group this am. She stated she feels very anxious as nothing seems concrete about when she is leaving or what her aftercare plans will be. Pt states she has been having a hard time sleeping and would like a different sleep medicine. Her depression and hopelessness are a 6/10.Pt would like to take her meds once she leaves and continue AA therapy. She does contract for safety and denies SI or HI. She does see problems staying on her meds after discharge. 2p- Pt is very glad we are giving her the opportunity to go to long term treatment. She stated she does not even remember running around her house while drunk and cutting up all the furniture when her 39 year old was watching her. She admits to cutting herself with a knife too and now her son keeps having bad dreams that his mother never gets off the elevator with him. Pt admits she does not want to go home until she has better coping skills.

## 2013-03-10 NOTE — Progress Notes (Signed)
  I agreed with findings and treatment plan of this patient Eastin Swing, MD 

## 2013-03-10 NOTE — Progress Notes (Signed)
Recreation Therapy Notes  Animal-Assisted Activity/Therapy (AAA/T) Program Checklist/Progress Notes Patient Eligibility Criteria Checklist & Daily Group note for Rec Tx Intervention  Date: 01.20.2015 Time: 2:45pm Location: [address]    AAA/T Program Assumption of Risk Form signed by Patient/ or Parent Legal Guardian yes  Patient is free of allergies or sever asthma yes  Patient reports no fear of animals yes  Patient reports no history of cruelty to animals yes   Patient understands his/her participation is voluntary yes  Patient washes hands before animal contact yes  Patient washes hands after animal contact yes  Behavioral Response: Appropriate  Education: Hand Washing, Appropriate Animal Interaction   Education Outcome: Acknowledges understanding   Clinical Observations/Feedback: Patient pet therapy dog, smiling while doing so. Patient interacted appropriately with peers during session.   Laureen Ochs Nohelia Valenza, LRT/CTRS  Bodee Lafoe L 03/10/2013 4:20 PM

## 2013-03-11 NOTE — B[facility] Group Notes (Signed)
Adult Psychoeducational Group Note  Date:  03/11/2013 Time:  12:06 AM  Group Topic/Focus:  Wrap-Up Group:   The focus of this group is to help patients review their daily goal of treatment and discuss progress on daily workbooks.  Participation Level:  Active  Participation Quality:  Appropriate  Affect:  Appropriate  Cognitive:  Appropriate  Insight: Appropriate  Engagement in Group:  Engaged  Modes of Intervention:  Discussion  Additional Comments:  Mallory Robinson stated that she feels blessed and that she took her life for granted.  She also stated she was grateful to have met everyone she encountered here.  She also stated that she learned a lot since being here.  She also expressed that everyday has been a good day and made her appreciate what she has.  Victorino Sparrow A 03/11/2013, 12:06 AM

## 2013-03-11 NOTE — Progress Notes (Signed)
Patient discharged at this time.  Patient denies SI/HI and denies AVH.  Patient AVS reviewed and patient verbalized understanding.  Patient calm and cooperative.  Patient discharged to [facility].  Patient offered no additional questions or concerns.

## 2013-03-11 NOTE — Discharge Summary (Signed)
Physician Discharge Summary Note  Patient:  Mallory Robinson is an 38 y.o., female MRN:  [id number] DOB:  1975-04-22 Patient phone:  [phone] (home)  Patient address:   [address] [place] [address],   Date of Admission:  03/04/2013 Date of Discharge: 03/11/12  Reason for Admission:  Alcohol detox  Discharge Diagnoses: Active Problems:   Alcohol dependence   Bipolar I disorder, most recent episode (or current) depressed, severe, without mention of psychotic behavior   PTSD (post-traumatic stress disorder)  Review of Systems  Constitutional: Negative.   HENT: Negative.   Eyes: Negative.   Respiratory: Negative.   Cardiovascular: Negative.   Gastrointestinal: Negative.   Genitourinary: Negative.   Musculoskeletal: Negative.   Skin: Negative.        Bodily piecing, tattoos  Neurological: Negative.   Endo/Heme/Allergies: Negative.   Psychiatric/Behavioral: Positive for depression (Stable) and substance abuse (Alcoholism). Negative for suicidal ideas, hallucinations and memory loss. The patient is nervous/anxious (Stable) and has insomnia (Stable).    DSM5: Schizophrenia Disorders:  NA Obsessive-Compulsive Disorders:  NA Trauma-Stressor Disorders:  Posttraumatic Stress Disorder (309.81) Substance/Addictive Disorders:  Alcohol Related Disorder - Severe (303.90) Depressive Disorders:  Bipolar I disorder, most recent episode (or current) depressed, severe, without mention of psychotic behavior  Axis Diagnosis:  AXIS I:  Alcohol related disorder, severe, PTSD, Bipolar I disorder, most recent episode (or current) depressed, severe, without mention of psychotic behavior AXIS II:  Deferred AXIS III:   Past Medical History  Diagnosis Date  . Bipolar 1 disorder   . Depression    AXIS IV:  other psychosocial or environmental problems and Alcoholism AXIS V:  62  Level of Care:  [facility]  Hospital Course:  38 Y/O famale who stop taking her "pills" in August when she lost  her insurance (Seroquel and Topamax) She had cut on her drinking but off meds she started drinking more. States that after she got off the Seroquel felt "bad" Her use of alcohol increased. She started drinking every day (galon in 2 and a half days) On weekends when she did not have to go to work she would drink all day. States that if she did not drink did not sleep. She got dependent on the alcohol. She experienced nausea shakes in the AM having to drink to take care of it. She started drinking at 89. States that she experiences cycles with the Bipolar Disorder. Every four months, gets more depressed, has a hard time leaving the house, starts thinking about suicide. When she fluctuates up "everything is perfect" over cleaning, going out, does not need to sleep, does feel impulsive, spends time, goes towards working as a stripper. When depressed she cuts to cope with the way she feels.  Lahela came to the [facility] ED with blood alcohol levels of 106. She has a hx of bipolar affective disorder and has been off of her medications, Seroquel and Lamictal due to lack of insurance. She started drinking more to self medicate to sleep. Tanae not only experienced withdrawal symptoms of alcohol, but mood disturbance and fluctuation as well. After admission assessment/evaluation, it was determined that Ishia will need alcohol detoxification treatment as well as mood stabilization. Her discharge plans included a referral to a long term treatment center ([facility]) for further substance abuse treatment.   [name] was ordered and received Librium detoxification treatment. She was restarted on Seroquel 100 mg for mood stabilization, Topamax 25 mg daily for mood stabilization and Trazodone 50 mg Q bedtime  for sleep. Analese also received other medication management for her other medical conditions that she presented. She tolerated her treatment regimen without any significant adverse effects and or reactions. Aariona has  completed detox treatment, and her mood stabilized. This is evidenced by reports of improved mood and absence of withdrawal symptoms. She is being discharged to the [facility] this am to continue further treatment. She received 14 days worth supply samples of her [facility] discharge medications and a 30 days worth of prescriptions. She left [facility] with all personal belongings in no apparent distress. Medication management and routine psychiatric care for [facility] per [facility].   Consults:  psychiatry  Significant Diagnostic Studies:  labs: Reviewed, Stable  Discharge Vitals:   Blood pressure 120/83, pulse 88, temperature 98.4 F (36.9 C), temperature source Oral, resp. rate 18, height 5\' 3"  (1.6 m), weight 73.483 kg (162 lb), last menstrual period 01/08/2013, SpO2 100.00%. Body mass index is 28.7 kg/(m^2). Lab Results:   No results found for this or any previous visit (from the past 72 hour(s)).  Physical Findings: AIMS: Facial and Oral Movements Muscles of Facial Expression: None, normal Lips and Perioral Area: None, normal Jaw: None, normal Tongue: None, normal,Extremity Movements Upper (arms, wrists, hands, fingers): None, normal Lower (legs, knees, ankles, toes): None, normal, Trunk Movements Neck, shoulders, hips: None, normal, Overall Severity Severity of abnormal movements (highest score from questions above): None, normal Incapacitation due to abnormal movements: None, normal Patient's awareness of abnormal movements (rate only patient's report): No Awareness, Dental Status Current problems with teeth and/or dentures?: No Does patient usually wear dentures?: No  CIWA:  CIWA-Ar Total: 0 COWS:     Psychiatric Specialty Exam: See Psychiatric Specialty Exam and Suicide Risk Assessment completed by Attending Physician prior to discharge.  Discharge destination:  [facility]  Is patient on multiple antipsychotic therapies at discharge:  No   Has Patient had three or  more failed trials of antipsychotic monotherapy by history:  No  Recommended Plan for Multiple Antipsychotic Therapies: NA     Medication List    STOP taking these medications       THERAFLU FLU/COLD PO      TAKE these medications     Indication   DSS 100 MG Caps  Take 100 mg by mouth 2 (two) times daily. (This medicine may be purchased from over the counter at your local pharmacy): For constipation   Indication:  Constipation     ferrous sulfate 325 (65 FE) MG tablet  Take 1 tablet (325 mg total) by mouth 2 (two) times daily with a meal. (This medicine can be purchased from over the counter at your local pharmacy): For low iron in the blood   Indication:  Iron Deficiency     polyethylene glycol packet  Commonly known as:  MIRALAX / GLYCOLAX  Take 17 g by mouth daily. (This medicine can be purchased from over the counter at your local pharmacy): For constipation   Indication:  Constipation     QUEtiapine 100 MG tablet  Commonly known as:  SEROQUEL  Take 1 tablet (100 mg total) by mouth at bedtime.   Indication:  Manic Phase of Manic-Depression     topiramate 25 MG tablet  Commonly known as:  TOPAMAX  Take 1 tablet (25 mg total) by mouth daily.   Indication:  Excessive Use of Alcohol, Manic-Depression that is Resistant to Treatment     traZODone 50 MG tablet  Commonly known as:  DESYREL  Take 1  tablet (50 mg total) by mouth at bedtime as needed for sleep.   Indication:  Trouble Sleeping           Follow-up Information   Follow up with [facility] On 03/11/2013. (Arrive at 8:00 am for screening and possible admission, for further inpatient treatment.  Brind ID, 30 day supply of meds and belongings)    Contact information:   [address] [place], [place] [address] Phone: [phone] Fax: [phone]      Follow up with [facility]. (Walk in for hospital discharge appointment, for outpatient medication management and therapy. Walk in clinic is Monday -  Friday 8 am - 3 pm)    Contact information:   [address], [place] [address] Phone: [phone] Fax: [phone]     Follow-up recommendations:  Activity:  As tolerated Diet: As recommended by your primary care doctor. Keep all scheduled follow-up appointments as recommended.  Comments:  Take all your medications as prescribed by your mental healthcare provider. Report any adverse effects and or reactions from your medicines to your outpatient provider promptly. Patient is instructed and cautioned to not engage in alcohol and or illegal drug use while on prescription medicines. In the event of worsening symptoms, patient is instructed to call the crisis hotline, 911 and or go to the nearest ED for appropriate evaluation and treatment of symptoms. Follow-up with your primary care provider for your other medical issues, concerns and or health care needs.   Total Discharge Time:  Greater than 30 minutes.  SignedEncarnacion Slates, PMHNP-BC 03/11/2013, 10:26 AM  Patient is seen face to face for psychiatric evaluation, suicide risk assessment and case discussed with treatment team and made disposition plan. Reviewed the information documented and agree with the treatment plan.  Sherese Heyward,JANARDHAHA R. 03/12/2013 3:31 PM

## 2013-03-16 NOTE — Progress Notes (Signed)
Patient Discharge Instructions:  After Visit Summary (AVS):   Faxed to:  03/16/13 Discharge Summary Note:   Faxed to:  03/16/13 Psychiatric Admission Assessment Note:   Faxed to:  03/16/13 Suicide Risk Assessment - Discharge Assessment:   Faxed to:  03/16/13 Faxed/Sent to the Next Level Care provider:  03/16/13 Faxed to [facility] @ [phone] Faxed to [facility] @ [place], 03/16/2013, 3:24 PM

## 2013-11-08 ENCOUNTER — Emergency Department (HOSPITAL_COMMUNITY)
Admission: EM | Admit: 2013-11-08 | Discharge: 2013-11-08 | Disposition: A | Discharge status: Home / Self Care | Payer: Medicaid Other | Attending: Emergency Medicine | Admitting: Emergency Medicine

## 2013-11-08 ENCOUNTER — Encounter (HOSPITAL_COMMUNITY): Payer: Self-pay | Admitting: Emergency Medicine

## 2013-11-08 DIAGNOSIS — Z880 Allergy status to penicillin: Secondary | ICD-10-CM | POA: Insufficient documentation

## 2013-11-08 DIAGNOSIS — F319 Bipolar disorder, unspecified: Secondary | ICD-10-CM | POA: Insufficient documentation

## 2013-11-08 DIAGNOSIS — L272 Dermatitis due to ingested food: Secondary | ICD-10-CM | POA: Insufficient documentation

## 2013-11-08 DIAGNOSIS — Z79899 Other long term (current) drug therapy: Secondary | ICD-10-CM | POA: Insufficient documentation

## 2013-11-08 DIAGNOSIS — T7840XA Allergy, unspecified, initial encounter: Secondary | ICD-10-CM

## 2013-11-08 MED ORDER — DIPHENHYDRAMINE HCL 50 MG/ML IJ SOLN
50.0000 mg | Freq: Once | INTRAMUSCULAR | Status: DC
  Filled 2013-11-08: qty 1

## 2013-11-08 MED ORDER — SODIUM CHLORIDE 0.9 % IV BOLUS (SEPSIS)
1000.0000 mL | INTRAVENOUS | Status: AC
  Administered 2013-11-08: 1000 mL via INTRAVENOUS

## 2013-11-08 MED ORDER — METHYLPREDNISOLONE SODIUM SUCC 125 MG IJ SOLR
125.0000 mg | Freq: Once | INTRAMUSCULAR | Status: AC
  Administered 2013-11-08: 125 mg via INTRAVENOUS
  Filled 2013-11-08: qty 2

## 2013-11-08 MED ORDER — EPINEPHRINE 0.3 MG/0.3ML IJ SOAJ: 0.3000 mg | Freq: Once | INTRAMUSCULAR | Status: DC

## 2013-11-08 MED ORDER — PREDNISONE 20 MG PO TABS: ORAL_TABLET | ORAL | Status: DC

## 2013-11-08 MED ORDER — DIPHENHYDRAMINE HCL 50 MG/ML IJ SOLN
25.0000 mg | Freq: Once | INTRAMUSCULAR | Status: AC
  Administered 2013-11-08: 25 mg via INTRAVENOUS

## 2013-11-08 MED ORDER — FAMOTIDINE IN NACL 20-0.9 MG/50ML-% IV SOLN
20.0000 mg | Freq: Once | INTRAVENOUS | Status: AC
  Administered 2013-11-08: 20 mg via INTRAVENOUS
  Filled 2013-11-08: qty 50

## 2013-11-08 NOTE — ED Provider Notes (Signed)
CSN: [id number]     Arrival date & time 11/08/13  1341 History   First MD Initiated Contact with Patient 11/08/13 1551     Chief Complaint  Patient presents with  . Allergic Reaction     (Consider location/radiation/quality/duration/timing/severity/associated sxs/prior Treatment) Patient is a 38 y.o. female presenting with allergic reaction. The history is provided by the patient.  Allergic Reaction Presenting symptoms: itching, rash and swelling   Itching:    Location:  Face   Severity:  Mild   Onset quality:  Sudden   Duration:  1 day   Timing:  Intermittent   Progression:  Resolved Swelling:    Location:  Face   Onset quality:  Sudden   Duration:  1 day   Timing:  Constant   Progression:  Unchanged   Chronicity:  New Severity:  Mild Prior allergic episodes:  No prior episodes Context: food   Relieved by:  Nothing Worsened by:  Nothing tried Ineffective treatments:  None tried   Past Medical History  Diagnosis Date  . Bipolar 1 disorder   . Depression    History reviewed. No pertinent past surgical history. History reviewed. No pertinent family history. History  Substance Use Topics  . Smoking status: Never Smoker   . Smokeless tobacco: Not on file  . Alcohol Use: Yes     Comment: Occasional   OB History   Grav Para Term Preterm Abortions TAB SAB Ect Mult Living                 Review of Systems  Constitutional: Negative for fever and fatigue.  HENT: Positive for facial swelling. Negative for congestion and drooling.   Eyes: Negative for pain.  Respiratory: Positive for shortness of breath. Negative for cough.   Cardiovascular: Negative for chest pain.  Gastrointestinal: Negative for nausea, vomiting, abdominal pain and diarrhea.  Genitourinary: Negative for dysuria and hematuria.  Musculoskeletal: Negative for back pain, gait problem and neck pain.  Skin: Positive for itching and rash. Negative for color change.  Neurological: Negative for dizziness  and headaches.  Hematological: Negative for adenopathy.  Psychiatric/Behavioral: Negative for behavioral problems.  All other systems reviewed and are negative.     Allergies  Penicillins  Home Medications   Prior to Admission medications   Medication Sig Start Date End Date Taking? Authorizing Provider  docusate sodium 100 MG CAPS Take 100 mg by mouth 2 (two) times daily. (This medicine may be purchased from over the counter at your local pharmacy): For constipation 03/10/13   Encarnacion Slates, NP  ferrous sulfate 325 (65 FE) MG tablet Take 1 tablet (325 mg total) by mouth 2 (two) times daily with a meal. (This medicine can be purchased from over the counter at your local pharmacy): For low iron in the blood 03/10/13   Encarnacion Slates, NP  polyethylene glycol (MIRALAX / GLYCOLAX) packet Take 17 g by mouth daily. (This medicine can be purchased from over the counter at your local pharmacy): For constipation 03/10/13   Encarnacion Slates, NP  QUEtiapine (SEROQUEL) 100 MG tablet Take 1 tablet (100 mg total) by mouth at bedtime. 03/10/13   Elmarie Shiley, NP  topiramate (TOPAMAX) 25 MG tablet Take 1 tablet (25 mg total) by mouth daily. 03/10/13   Elmarie Shiley, NP  traZODone (DESYREL) 50 MG tablet Take 1 tablet (50 mg total) by mouth at bedtime as needed for sleep. 03/10/13   Elmarie Shiley, NP   BP 153/101  Pulse 64  Temp(Src) 99 F (37.2 C) (Oral)  Resp 17  SpO2 100%  LMP 10/02/2013 Physical Exam  Nursing note and vitals reviewed. Constitutional: She is oriented to person, place, and time. She appears well-developed and well-nourished.  HENT:  Head: Normocephalic and atraumatic.  Mouth/Throat: Oropharynx is clear and moist. No oropharyngeal exudate.  Possibly some mild swelling in the periorbital area bilaterally.  Prominent lymph nodes in the submandibular area.  Normal appearing tongue and oropharynx.  Eyes: Conjunctivae and EOM are normal. Pupils are equal, round, and reactive to light.  Neck:  Normal range of motion. Neck supple.  Cardiovascular: Normal rate, regular rhythm, normal heart sounds and intact distal pulses.  Exam reveals no gallop and no friction rub.   No murmur heard. Pulmonary/Chest: Effort normal and breath sounds normal. No respiratory distress. She has no wheezes.  Abdominal: Soft. Bowel sounds are normal. There is no tenderness. There is no rebound and no guarding.  Musculoskeletal: Normal range of motion. She exhibits no edema and no tenderness.  Neurological: She is alert and oriented to person, place, and time.  Skin: Skin is warm and dry.  Psychiatric: She has a normal mood and affect. Her behavior is normal.    ED Course  Procedures (including critical care time) Labs Review Labs Reviewed - No data to display  Imaging Review No results found.   EKG Interpretation None      Date: 11/08/2013  Rate: 56  Rhythm: sinus bradycardia  QRS Axis: normal  Intervals: normal  ST/T Wave abnormalities: normal  Conduction Disutrbances:none  Narrative Interpretation: NSR  Old EKG Reviewed: none available   MDM   Final diagnoses:  Allergic reaction, initial encounter    4:08 PM 38 y.o. female with a history of allergies to penicillin and a mild allergy to melon who presents with an allergic reaction. She states that she ate several different foods at a parade yesterday and approximately one hour later began developing some hives on her face. She also developed some mild swelling in the periorbital area and in her submandibular area. She states that her symptoms have persisted today although the rash is gone. She states that she has some mild shortness of breath with ambulation. She denies any vomiting, diarrhea, GI distress, or fever. She is afebrile and vital signs are unremarkable here. She is in no acute distress on exam. She feels that her tongue is enlarged but I see no evidence of oropharyngeal swelling or obvious facial swelling. Possibly some mild  swelling in the peri-orbital area. She does have some prominent lymph nodes in the submandibular area. Will treat with Solu-Medrol, Pepcid, and Benadryl. Given well appearance, do not think pt needs epi. Will tx and reassess.   6:01 PM: Pt feeling better. She continues to appear well. Will provide Rx for epi-pen and I instructed her on its use. I have discussed the diagnosis/risks/treatment options with the patient and believe the pt to be eligible for discharge home to follow-up with her pcp as needed. We also discussed returning to the ED immediately if new or worsening sx occur. We discussed the sx which are most concerning (e.g., inc swelling, sob, cp) that necessitate immediate return. Medications administered to the patient during their visit and any new prescriptions provided to the patient are listed below.  Medications given during this visit Medications  sodium chloride 0.9 % bolus 1,000 mL (1,000 mLs Intravenous New Bag/Given 11/08/13 1610)  famotidine (PEPCID) IVPB 20 mg (0 mg Intravenous Stopped 11/08/13 1640)  methylPREDNISolone  sodium succinate (SOLU-MEDROL) 125 mg/2 mL injection 125 mg (125 mg Intravenous Given 11/08/13 1607)  diphenhydrAMINE (BENADRYL) injection 25 mg (25 mg Intravenous Given 11/08/13 1608)    Discharge Medication List as of 11/08/2013  6:03 PM    START taking these medications   Details  EPINEPHrine 0.3 mg/0.3 mL IJ SOAJ injection Inject 0.3 mLs (0.3 mg total) into the muscle once., Starting 11/08/2013, Print    predniSONE (DELTASONE) 20 MG tablet Take 3 tablets by mouth on day one. Take 2 tablets by mouth on day 2. Take 1 tablet by mouth on day 3., Print         Pamella Pert, MD 11/08/13 2238

## 2013-11-08 NOTE — Discharge Instructions (Signed)
Allergies  Allergies may happen from anything your body is sensitive to. This may be food, medicines, pollens, chemicals, and many other things. Food allergies can be severe and deadly.  HOME CARE  If you do not know what causes a reaction, keep a diary. Write down the foods you ate and the symptoms that followed. Avoid foods that cause reactions.  If you have red raised spots (hives) or a rash:  Take medicine as told by your doctor.  Use medicines for red raised spots and itching as needed.  Apply cold cloths (compresses) to the skin. Take a cool bath. Avoid hot baths or showers.  If you are severely allergic:  It is often necessary to go to the hospital after you have treated your reaction.  Wear your medical alert jewelry.  You and your family must learn how to give a allergy shot or use an allergy kit (anaphylaxis kit).  Always carry your allergy kit or shot with you. Use this medicine as told by your doctor if a severe reaction is occurring. GET HELP RIGHT AWAY IF:  You have trouble breathing or are making high-pitched whistling sounds (wheezing).  You have a tight feeling in your chest or throat.  You have a puffy (swollen) mouth.  You have red raised spots, puffiness (swelling), or itching all over your body.  You have had a severe reaction that was helped by your allergy kit or shot. The reaction can return once the medicine has worn off.  You think you are having a food allergy. Symptoms most often happen within 30 minutes of eating a food.  Your symptoms have not gone away within 2 days or are getting worse.  You have new symptoms.  You want to retest yourself with a food or drink you think causes an allergic reaction. Only do this under the care of a doctor. MAKE SURE YOU:   Understand these instructions.  Will watch your condition.  Will get help right away if you are not doing well or get worse. Document Released: 06/02/2012 Document Reviewed:  06/02/2012 [facility] Patient Information 2015 [place]. This information is not intended to replace advice given to you by your health care provider. Make sure you discuss any questions you have with your health care provider.

## 2013-11-08 NOTE — ED Notes (Signed)
PA student at bedside.

## 2013-11-08 NOTE — ED Notes (Signed)
Pt reports onset yesterday of possible allergic reaction. Reports eating unusual foods yesterday at the parade and now having swelling to face and itching. States that she feels like tongue is slightly swelling, airway is intact at triage, spo2 99% at triage. Pt took benadryl this am which helped improve symptoms.

## 2013-11-08 NOTE — ED Notes (Signed)
Dr Harrison at bedside

## 2017-05-23 ENCOUNTER — Ambulatory Visit (HOSPITAL_COMMUNITY)
Admission: EM | Admit: 2017-05-23 | Discharge: 2017-05-23 | Disposition: A | Discharge status: Home / Self Care | Payer: Medicaid Other | Attending: Family Medicine | Admitting: Family Medicine

## 2017-05-23 ENCOUNTER — Other Ambulatory Visit: Payer: Self-pay

## 2017-05-23 ENCOUNTER — Encounter (HOSPITAL_COMMUNITY): Payer: Self-pay | Admitting: Emergency Medicine

## 2017-05-23 DIAGNOSIS — H6983 Other specified disorders of Eustachian tube, bilateral: Secondary | ICD-10-CM | POA: Diagnosis not present

## 2017-05-23 MED ORDER — PREDNISONE 10 MG (21) PO TBPK: ORAL_TABLET | Freq: Every day | ORAL | 0 refills | Status: DC

## 2017-05-23 NOTE — ED Triage Notes (Signed)
Both ears with pressure for 2 weeks, right ear making a swishing sound.  Patient recently had a cold and sore throat

## 2017-05-28 NOTE — ED Provider Notes (Signed)
[place]   [id number] 05/23/17 Arrival Time: 5852  ASSESSMENT & PLAN:  1. Dysfunction of both eustachian tubes     Meds ordered this encounter  Medications  . predniSONE (STERAPRED UNI-PAK 21 TAB) 10 MG (21) TBPK tablet    Sig: Take by mouth daily. Take as directed.    Dispense:  21 tablet    Refill:  0   Discussed typical duration of symptoms. OTC symptom care as needed. Ensure adequate fluid intake and rest. May f/u with PCP or here as needed.  Reviewed expectations re: course of current medical issues. Questions answered. Outlined signs and symptoms indicating need for more acute intervention. Patient verbalized understanding. After Visit Summary given.   SUBJECTIVE: History from: patient.  Mallory Robinson is a 42 y.o. female who presents with complaint of mild nasal congestion, post-nasal drainage. Now with bilateral ear "pressure". Onset abrupt, approximately 2 weeks ago. No respiratory symptoms. Fever: no. Overall normal PO intake without n/v. Sick contacts: no. OTC treatment: Decongestant without much help. Pressure in ears bothering her the most. No ear drainage.   Social History   Tobacco Use  Smoking Status Never Smoker    ROS: As per HPI.   OBJECTIVE:  Vitals:   05/23/17 1614  BP: (!) 140/95  Pulse: (!) 114  Resp: 20  Temp: 100.2 F (37.9 C)  TempSrc: Oral  SpO2: 99%     General appearance: alert; appears fatigued HEENT: nasal congestion; clear runny nose; throat irritation secondary to post-nasal drainage; TMs appear normal Neck: supple without LAD Lungs: unlabored respirations, symmetrical air entry; cough: absent; no respiratory distress Skin: warm and dry Psychological: alert and cooperative; normal mood and affect   Allergies  Allergen Reactions  . Food Itching and Swelling    Melons  . Penicillins Other (See Comments)    Spots and made hair fall out    Past Medical History:  Diagnosis Date  . Bipolar 1 disorder  ([place])   . Depression    Family History  Problem Relation Age of Onset  . Hypertension Mother    Social History   Socioeconomic History  . Marital status: Married    Spouse name: Not on file  . Number of children: Not on file  . Years of education: Not on file  . Highest education level: Not on file  Occupational History  . Not on file  Social Needs  . Financial resource strain: Not on file  . Food insecurity:    Worry: Not on file    Inability: Not on file  . Transportation needs:    Medical: Not on file    Non-medical: Not on file  Tobacco Use  . Smoking status: Never Smoker  Substance and Sexual Activity  . Alcohol use: Yes    Comment: Occasional  . Drug use: No  . Sexual activity: Yes    Birth control/protection: None  Lifestyle  . Physical activity:    Days per week: Not on file    Minutes per session: Not on file  . Stress: Not on file  Relationships  . Social connections:    Talks on phone: Not on file    Gets together: Not on file    Attends religious service: Not on file    Active member of club or organization: Not on file    Attends meetings of clubs or organizations: Not on file    Relationship status: Not on file  . Intimate partner violence:    Fear  of current or ex partner: Not on file    Emotionally abused: Not on file    Physically abused: Not on file    Forced sexual activity: Not on file  Other Topics Concern  . Not on file  Social History Narrative  . Not on file           Vanessa Kick, MD 05/28/17 [phone]

## 2017-11-10 ENCOUNTER — Emergency Department (HOSPITAL_COMMUNITY): Payer: Medicaid Other

## 2017-11-10 ENCOUNTER — Encounter (HOSPITAL_COMMUNITY): Payer: Self-pay | Admitting: Emergency Medicine

## 2017-11-10 ENCOUNTER — Inpatient Hospital Stay (HOSPITAL_COMMUNITY)
Admission: EM | Admit: 2017-11-10 | Discharge: 2017-11-14 | DRG: 065 | Disposition: A | Discharge status: Home Health | Payer: Medicaid Other | Attending: Internal Medicine | Admitting: Internal Medicine

## 2017-11-10 DIAGNOSIS — W19XXXA Unspecified fall, initial encounter: Secondary | ICD-10-CM | POA: Diagnosis not present

## 2017-11-10 DIAGNOSIS — R29818 Other symptoms and signs involving the nervous system: Secondary | ICD-10-CM | POA: Diagnosis not present

## 2017-11-10 DIAGNOSIS — R29705 NIHSS score 5: Secondary | ICD-10-CM | POA: Diagnosis not present

## 2017-11-10 DIAGNOSIS — I63411 Cerebral infarction due to embolism of right middle cerebral artery: Principal | ICD-10-CM | POA: Diagnosis present

## 2017-11-10 DIAGNOSIS — H5461 Unqualified visual loss, right eye, normal vision left eye: Secondary | ICD-10-CM | POA: Diagnosis present

## 2017-11-10 DIAGNOSIS — I6340 Cerebral infarction due to embolism of unspecified cerebral artery: Secondary | ICD-10-CM

## 2017-11-10 DIAGNOSIS — R29704 NIHSS score 4: Secondary | ICD-10-CM | POA: Diagnosis not present

## 2017-11-10 DIAGNOSIS — F319 Bipolar disorder, unspecified: Secondary | ICD-10-CM | POA: Diagnosis present

## 2017-11-10 DIAGNOSIS — N920 Excessive and frequent menstruation with regular cycle: Secondary | ICD-10-CM | POA: Diagnosis present

## 2017-11-10 DIAGNOSIS — R0989 Other specified symptoms and signs involving the circulatory and respiratory systems: Secondary | ICD-10-CM | POA: Diagnosis present

## 2017-11-10 DIAGNOSIS — E538 Deficiency of other specified B group vitamins: Secondary | ICD-10-CM | POA: Diagnosis present

## 2017-11-10 DIAGNOSIS — Z8673 Personal history of transient ischemic attack (TIA), and cerebral infarction without residual deficits: Secondary | ICD-10-CM

## 2017-11-10 DIAGNOSIS — S0990XA Unspecified injury of head, initial encounter: Secondary | ICD-10-CM | POA: Diagnosis not present

## 2017-11-10 DIAGNOSIS — I10 Essential (primary) hypertension: Secondary | ICD-10-CM | POA: Diagnosis not present

## 2017-11-10 DIAGNOSIS — R531 Weakness: Secondary | ICD-10-CM | POA: Diagnosis not present

## 2017-11-10 DIAGNOSIS — I639 Cerebral infarction, unspecified: Secondary | ICD-10-CM | POA: Diagnosis present

## 2017-11-10 DIAGNOSIS — E663 Overweight: Secondary | ICD-10-CM | POA: Diagnosis present

## 2017-11-10 DIAGNOSIS — Z6826 Body mass index (BMI) 26.0-26.9, adult: Secondary | ICD-10-CM

## 2017-11-10 DIAGNOSIS — D509 Iron deficiency anemia, unspecified: Secondary | ICD-10-CM | POA: Diagnosis present

## 2017-11-10 DIAGNOSIS — R29702 NIHSS score 2: Secondary | ICD-10-CM | POA: Diagnosis present

## 2017-11-10 DIAGNOSIS — G8194 Hemiplegia, unspecified affecting left nondominant side: Secondary | ICD-10-CM | POA: Diagnosis present

## 2017-11-10 DIAGNOSIS — D259 Leiomyoma of uterus, unspecified: Secondary | ICD-10-CM | POA: Diagnosis present

## 2017-11-10 DIAGNOSIS — Z8249 Family history of ischemic heart disease and other diseases of the circulatory system: Secondary | ICD-10-CM

## 2017-11-10 DIAGNOSIS — D72829 Elevated white blood cell count, unspecified: Secondary | ICD-10-CM | POA: Diagnosis present

## 2017-11-10 HISTORY — DX: Other complications of anesthesia, initial encounter: T88.59XA

## 2017-11-10 HISTORY — DX: Adverse effect of unspecified anesthetic, initial encounter: T41.45XA

## 2017-11-10 HISTORY — DX: Other specified postprocedural states: Z98.890

## 2017-11-10 HISTORY — DX: Other specified postprocedural states: R11.2

## 2017-11-10 LAB — PROTIME-INR
INR: 1.16
Prothrombin Time: 14.7 seconds (ref 11.4–15.2)

## 2017-11-10 LAB — COMPREHENSIVE METABOLIC PANEL
ALT: 20 U/L (ref 0–44)
AST: 34 U/L (ref 15–41)
Albumin: 4.2 g/dL (ref 3.5–5.0)
Alkaline Phosphatase: 63 U/L (ref 38–126)
Anion gap: 10 (ref 5–15)
BUN: 7 mg/dL (ref 6–20)
CHLORIDE: 104 mmol/L (ref 98–111)
CO2: 22 mmol/L (ref 22–32)
CREATININE: 0.96 mg/dL (ref 0.44–1.00)
Calcium: 9.1 mg/dL (ref 8.9–10.3)
GFR calc Af Amer: >60 mL/min (ref >60)
Glucose, Bld: 120 mg/dL — ABNORMAL HIGH (ref 70–99)
POTASSIUM: 3.9 mmol/L (ref 3.5–5.1)
Sodium: 136 mmol/L (ref 135–145)
Total Bilirubin: 0.6 mg/dL (ref 0.3–1.2)
Total Protein: 7.7 g/dL (ref 6.5–8.1)

## 2017-11-10 LAB — DIFFERENTIAL
BAND NEUTROPHILS: 0 %
Basophils Absolute: 0 10*3/uL (ref 0.0–0.1)
Basophils Relative: 0 %
Blasts: 0 %
Eosinophils Absolute: 0 10*3/uL (ref 0.0–0.7)
Eosinophils Relative: 0 %
LYMPHS ABS: 1.7 10*3/uL (ref 0.7–4.0)
LYMPHS PCT: 10 %
MONO ABS: 1 10*3/uL (ref 0.1–1.0)
MYELOCYTES: 0 %
Metamyelocytes Relative: 0 %
Monocytes Relative: 6 %
NRBC: 0 /100{WBCs}
Neutro Abs: 14.1 10*3/uL — ABNORMAL HIGH (ref 1.7–7.7)
Neutrophils Relative %: 84 %
OTHER: 0 %
PROMYELOCYTES RELATIVE: 0 %

## 2017-11-10 LAB — CBC
HEMATOCRIT: 23.2 % — AB (ref 36.0–46.0)
Hemoglobin: 6 g/dL — CL (ref 12.0–15.0)
MCH: 25.9 pg — ABNORMAL LOW (ref 26.0–34.0)
MCHC: 25.9 g/dL — ABNORMAL LOW (ref 30.0–36.0)
MCV: 61.9 fL — AB (ref 78.0–100.0)
PLATELETS: UNDETERMINED 10*3/uL (ref 150–400)
RBC: 3.75 MIL/uL — AB (ref 3.87–5.11)
RDW: 28.6 % — ABNORMAL HIGH (ref 11.5–15.5)
WBC: 16.8 10*3/uL — AB (ref 4.0–10.5)

## 2017-11-10 LAB — I-STAT CHEM 8, ED
BUN: 7 mg/dL (ref 6–20)
CALCIUM ION: 1.1 mmol/L — AB (ref 1.15–1.40)
CHLORIDE: 104 mmol/L (ref 98–111)
CREATININE: 0.9 mg/dL (ref 0.44–1.00)
GLUCOSE: 117 mg/dL — AB (ref 70–99)
HCT: 24 % — ABNORMAL LOW (ref 36.0–46.0)
Hemoglobin: 8.2 g/dL — ABNORMAL LOW (ref 12.0–15.0)
Potassium: 4 mmol/L (ref 3.5–5.1)
Sodium: 139 mmol/L (ref 135–145)
TCO2: 23 mmol/L (ref 22–32)

## 2017-11-10 LAB — I-STAT TROPONIN, ED: TROPONIN I, POC: 0 ng/mL (ref 0.00–0.08)

## 2017-11-10 LAB — ETHANOL: Alcohol, Ethyl (B): <10 mg/dL (ref <10)

## 2017-11-10 LAB — PREPARE RBC (CROSSMATCH)

## 2017-11-10 LAB — I-STAT BETA HCG BLOOD, ED (MC, WL, AP ONLY): I-stat hCG, quantitative: <5 m[IU]/mL (ref <5)

## 2017-11-10 LAB — APTT: aPTT: 26 seconds (ref 24–36)

## 2017-11-10 MED ORDER — ONDANSETRON HCL 4 MG/2ML IJ SOLN
4.0000 mg | Freq: Once | INTRAMUSCULAR | Status: AC
  Administered 2017-11-10: 4 mg via INTRAVENOUS
  Filled 2017-11-10: qty 2

## 2017-11-10 MED ORDER — SODIUM CHLORIDE 0.9% FLUSH
3.0000 mL | Freq: Two times a day (BID) | INTRAVENOUS | Status: DC
  Administered 2017-11-11 – 2017-11-13 (×5): 3 mL via INTRAVENOUS

## 2017-11-10 MED ORDER — STROKE: EARLY STAGES OF RECOVERY BOOK
Freq: Once | Status: DC
  Filled 2017-11-10 (×2): qty 1

## 2017-11-10 MED ORDER — IOPAMIDOL (ISOVUE-370) INJECTION 76%
INTRAVENOUS | Status: AC
  Administered 2017-11-10: 90 mL via INTRAVENOUS
  Filled 2017-11-10: qty 50

## 2017-11-10 MED ORDER — FOLIC ACID 1 MG PO TABS
2.0000 mg | ORAL_TABLET | Freq: Every day | ORAL | Status: DC
  Administered 2017-11-11 – 2017-11-12 (×2): 2 mg via ORAL
  Filled 2017-11-10 (×3): qty 2

## 2017-11-10 MED ORDER — IOPAMIDOL (ISOVUE-370) INJECTION 76%
50.0000 mL | Freq: Once | INTRAVENOUS | Status: AC | PRN
  Administered 2017-11-10: 50 mL via INTRAVENOUS

## 2017-11-10 MED ORDER — SODIUM CHLORIDE 0.9% IV SOLUTION: Freq: Once | INTRAVENOUS | Status: DC

## 2017-11-10 MED ORDER — HEPARIN (PORCINE) IN NACL 100-0.45 UNIT/ML-% IJ SOLN
1150.0000 [IU]/h | INTRAMUSCULAR | Status: DC
  Administered 2017-11-11: 900 [IU]/h via INTRAVENOUS
  Administered 2017-11-11: 1300 [IU]/h via INTRAVENOUS
  Administered 2017-11-12: 1050 [IU]/h via INTRAVENOUS
  Filled 2017-11-10 (×4): qty 250

## 2017-11-10 NOTE — ED Notes (Signed)
Pt denies vomiting, denies vaginal bleeding, denies rectal bleeding/dark tarry stools/iron smell to feces.  Patient states last time she had these symptoms, she had to have two transfusions.

## 2017-11-10 NOTE — Progress Notes (Signed)
[place] for heparin Indication: stroke protocol  Heparin Dosing Weight: 66.8 kg  Labs: Recent Labs    11/10/17 2125 11/10/17 2129  HGB 8.2*  --   HCT 24.0*  --   APTT  --  26  LABPROT  --  14.7  INR  --  1.16  CREATININE 0.90  --     Assessment: 25 yof presenting as code stroke. Pharmacy consulted by Neurology to dose heparin per stroke protocol. Not on anticoagulation PTA. CBC pending. No bleed documented. Will target lower goal with no boluses per protocol.  Goal of Therapy:  Heparin level 0.3-0.5 units/ml, no boluses Monitor platelets by anticoagulation protocol: Yes   Plan:  No boluses. Start heparin at 900 units/h 6h heparin level Daily heparin level/CBC Monitor s/sx bleeding F/u Neuro plans  Elicia Lamp, PharmD, BCPS Clinical Pharmacist 11/10/2017 9:53 PM

## 2017-11-10 NOTE — Consult Note (Signed)
Neurology Consultation Reason for Consult: Left-sided weakness Referring Physician: Roxine Caddy  CC: Left-sided weakness  History is obtained from: Patient  HPI: Mallory Robinson is a 42 y.o. female with a history of previous TIA who presents with left-sided weakness that started earlier tonight.  She states that symptoms started around 5:30 PM.  She developed abrupt onset left-sided weakness and numbness.  She also noticed that she lost vision in her right eye.  She tried closing each eye sequentially and realized that it was her right eye and not her right visual field.  She states that this gradually improved over the course of the about an hour and then she had abrupt recurrence of the symptoms around 830 p.m. she was brought in as a code stroke, however her symptoms had mostly resolved by the time of her arrival.  She states that after the first episode at 5:30 PM, she never returned completely to normal.   LKW: 5:30 PM tpa given?: no, mild symptoms    ROS: A 14 point ROS was performed and is negative except as noted in the HPI.   Past Medical History:  Diagnosis Date  . Bipolar 1 disorder ([place])   . Depression      Family History  Problem Relation Age of Onset  . Hypertension Mother      Social History:  reports that she has never smoked. She has never used smokeless tobacco. She reports that she drinks alcohol. She reports that she does not use drugs.   Exam: Current vital signs: BP (!) 138/96   Pulse 80   Resp 19   Wt 66.8 kg   SpO2 100%   BMI 26.09 kg/m  Vital signs in last 24 hours: Pulse Rate:  [80] 80 (09/22 2130) Resp:  [19] 19 (09/22 2130) BP: (138)/(96) 138/96 (09/22 2130) SpO2:  [100 %] 100 % (09/22 2130) Weight:  [66.8 kg] 66.8 kg (09/22 2127)   Physical Exam  Constitutional: Appears well-developed and well-nourished.  Psych: Affect appropriate to situation Eyes: No scleral injection HENT: No OP obstrucion Head: Normocephalic.  Cardiovascular: Normal  rate and regular rhythm.  Respiratory: Effort normal, non-labored breathing GI: Soft.  No distension. There is no tenderness.  Skin: WDI  Neuro: Mental Status: Patient is awake, alert, oriented to person, place, month, year, and situation. Patient is able to give a clear and coherent history. No signs of aphasia or neglect Cranial Nerves: II: Visual Fields are full. Pupils are equal, round, and reactive to light.   III,IV, VI: EOMI without ptosis or diploplia.  V: Facial sensation is symmetric to temperature VII: Facial movement is symmetric.  VIII: hearing is intact to voice X: Uvula elevates symmetrically XI: Shoulder shrug is symmetric. XII: tongue is midline without atrophy or fasciculations.  Motor: Tone is normal. Bulk is normal. 5/5 strength was present on the right side, she has mild 4+/5 strength of the left arm and leg without drift in the arm and with mild drift of the leg Sensory: Sensation is diminished throughout the left side Cerebellar: FNF and HKS are intact on the right, consistent with her mild weakness in the left  I have reviewed labs in epic and the results pertinent to this consultation are: Creatinine 0.9 Hemoglobin 8.2  I have reviewed the images obtained: CT head-no acute findings, CTA neck- adherent thrombus,?  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  I suspect dissection though she does not have pain and there is no clear dissection flap visible on CTA.  I would favor starting IV heparin with close monitoring.  Her current symptoms are such that I do not think that TPA is warranted, especially as we are at the cusp of her time window and she has a pretty severe anemia.  She has FMD type changes, so I suspect this may be related.  Recommendations: - HgbA1c, fasting lipid panel - MRI  of the brain without contrast - Frequent neuro  checks - Echocardiogram - Carotid dopplers, may help characterize her finding on CTA - Prophylactic therapy-stroke protocol heparin - Risk factor modification - Telemetry monitoring - PT consult, OT consult, Speech consult - Stroke team to follow   Roland Rack, MD [facility] [phone]  If 7pm- 7am, please page neurology on call as listed in [place].

## 2017-11-10 NOTE — ED Triage Notes (Signed)
Per EMS:  Pt with two episodes of sudden onset of lower limb weakness.  First episode began around 1730.  Resolved by 1930.  Second episode started at 2030, weakness in both legs caused patient to fall, falling on right side.  Pt also c/o right vision blindness.  Patient with a hx of TIA approx 3 years ago.  Pt 170/110 en route, CBG 121, NSR 70s

## 2017-11-10 NOTE — ED Provider Notes (Signed)
[facility] EMERGENCY DEPARTMENT Provider Note   CSN: [id number] Arrival date & time: 11/10/17  2115   An emergency department physician performed an initial assessment on this suspected stroke patient at 2120.  History   Chief Complaint Chief Complaint  Patient presents with  . Code Stroke    HPI Mallory Robinson is a 42 y.o. female.  Patient is a 42 year old female with a history of bipolar disorder and alcohol abuse who presents with left-sided weakness.  A code stroke was activated by EMS.  She states that she had sudden onset of heaviness and weakness to her left arm and left leg.  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  She had a similar episode earlier in the day that resolved.  She feels like her symptoms have improved but her left leg and left arm still feel heavy.  Her vision has returned.  She denies any headache.  No nausea or vomiting.  No speech deficits.  She had similar episode about 2 years ago and went to a hospital in [place].  At that time she did have a left arm heaviness and weakness as well as the leg and vomiting but no vision changes.  She also states that her hemoglobin was really low and she had to get a blood transfusion at that time.  She states she has baseline anemia but does not know why.  She has moderately heavy menstrual cycles but no current bleeding.  She denies any black tarry stools.  No visible blood in her stool.     Past Medical History:  Diagnosis Date  . Bipolar 1 disorder ([place])   . Depression     Patient Active Problem List   Diagnosis Date Noted  . Bipolar I disorder, most recent episode (or current) depressed, severe, without mention of psychotic behavior 03/05/2013  . PTSD (post-traumatic stress disorder) 03/05/2013  . Alcohol dependence ([place]) 03/04/2013    History reviewed. No pertinent surgical history.   OB History   None      Home Medications    Prior to Admission medications     Medication Sig Start Date End Date Taking? Authorizing Provider  EPINEPHrine 0.3 mg/0.3 mL IJ SOAJ injection Inject 0.3 mLs (0.3 mg total) into the muscle once. Patient not taking: Reported on 11/10/2017 11/08/13   Pamella Pert, MD  predniSONE (STERAPRED UNI-PAK 21 TAB) 10 MG (21) TBPK tablet Take by mouth daily. Take as directed. Patient not taking: Reported on 11/10/2017 05/23/17   Vanessa Kick, MD    Family History Family History  Problem Relation Age of Onset  . Hypertension Mother     Social History Social History   Tobacco Use  . Smoking status: Never Smoker  . Smokeless tobacco: Never Used  Substance Use Topics  . Alcohol use: Yes    Comment: Occasional  . Drug use: No     Allergies   Food and Penicillins   Review of Systems Review of Systems  Constitutional: Positive for fatigue. Negative for chills, diaphoresis and fever.  HENT: Negative for congestion, rhinorrhea and sneezing.   Eyes: Positive for visual disturbance.  Respiratory: Negative for cough, chest tightness and shortness of breath.   Cardiovascular: Negative for chest pain and leg swelling.  Gastrointestinal: Negative for abdominal pain, blood in stool, diarrhea, nausea and vomiting.  Genitourinary: Negative for difficulty urinating, flank pain, frequency and hematuria.  Musculoskeletal: Negative for arthralgias and back pain.  Skin: Negative for rash.  Neurological:  Positive for weakness and numbness. Negative for dizziness, speech difficulty and headaches.     Physical Exam Updated Vital Signs BP (!) 138/96   Pulse 80   Resp 19   Wt 66.8 kg   SpO2 100%   BMI 26.09 kg/m   Physical Exam  Constitutional: She is oriented to person, place, and time. She appears well-developed and well-nourished.  HENT:  Head: Normocephalic and atraumatic.  Eyes: Pupils are equal, round, and reactive to light. EOM are normal.  Neck: Normal range of motion. Neck supple.  Cardiovascular: Normal rate,  regular rhythm and normal heart sounds.  Pulmonary/Chest: Effort normal and breath sounds normal. No respiratory distress. She has no wheezes. She has no rales. She exhibits no tenderness.  Abdominal: Soft. Bowel sounds are normal. There is no tenderness. There is no rebound and no guarding.  Musculoskeletal: Normal range of motion. She exhibits no edema.  Lymphadenopathy:    She has no cervical adenopathy.  Neurological: She is alert and oriented to person, place, and time.  Patient still has some mild weakness in the left arm and left leg.  Sensation seems to be intact to light touch.  No obvious cranial nerve deficit.  Skin: Skin is warm and dry. No rash noted.  Psychiatric: She has a normal mood and affect.     ED Treatments / Results  Labs (all labs ordered are listed, but only abnormal results are displayed) Labs Reviewed  CBC - Abnormal; Notable for the following components:      Result Value   WBC 16.8 (*)    RBC 3.75 (*)    Hemoglobin 6.0 (*)    HCT 23.2 (*)    MCV 61.9 (*)    MCH 25.9 (*)    MCHC 25.9 (*)    RDW 28.6 (*)    All other components within normal limits  DIFFERENTIAL - Abnormal; Notable for the following components:   Neutro Abs 14.1 (*)    All other components within normal limits  COMPREHENSIVE METABOLIC PANEL - Abnormal; Notable for the following components:   Glucose, Bld 120 (*)    All other components within normal limits  I-STAT CHEM 8, ED - Abnormal; Notable for the following components:   Glucose, Bld 117 (*)    Calcium, Ion 1.10 (*)    Hemoglobin 8.2 (*)    HCT 24.0 (*)    All other components within normal limits  ETHANOL  PROTIME-INR  APTT  RAPID URINE DRUG SCREEN, HOSP PERFORMED  URINALYSIS, ROUTINE W REFLEX MICROSCOPIC  HEPARIN LEVEL (UNFRACTIONATED)  CBC  FIBRINOGEN  I-STAT TROPONIN, ED  I-STAT BETA HCG BLOOD, ED ([facility], [facility], [facility] ONLY)  TYPE AND SCREEN  PREPARE RBC (CROSSMATCH)    EKG EKG Interpretation  Date/Time:  Sunday  November 10 2017 21:43:52 EDT Ventricular Rate:  94 PR Interval:    QRS Duration: 78 QT Interval:  372 QTC Calculation: 466 R Axis:   77 Text Interpretation:  Sinus or ectopic atrial rhythm Consider left ventricular hypertrophy Baseline wander in lead(s) V6 SINCE LAST TRACING HEART RATE HAS INCREASED Confirmed by Malvin Johns [phone]) on 11/10/2017 10:41:23 PM   Radiology Ct Angio Head W Or Wo Contrast  Result Date: 11/10/2017 CLINICAL DATA:  Acute onset lower extremity weakness, fell to RIGHT. Vision changes. EXAM: CT ANGIOGRAPHY HEAD AND NECK TECHNIQUE: Multidetector CT imaging of the head and neck was performed using the standard protocol during bolus administration of intravenous contrast. Multiplanar CT image reconstructions and MIPs were obtained  to evaluate the vascular anatomy. Carotid stenosis measurements (when applicable) are obtained utilizing NASCET criteria, using the distal internal carotid diameter as the denominator. COMPARISON:  CT HEAD November 10, 2017 at 9:33 p.m. FINDINGS: CTA NECK FINDINGS: AORTIC ARCH: Normal appearance of the thoracic arch, 2 vessel arch is a normal variant. The origins of the innominate, left Common carotid artery and subclavian artery are widely patent. RIGHT CAROTID SYSTEM: Common carotid artery is patent. Shelf-like filling defect RIGHT carotid bulb without hemodynamically significant stenosis by NASCET criteria. LEFT CAROTID SYSTEM: Common carotid artery is patent. Normal appearance of the carotid bifurcation without hemodynamically significant stenosis by NASCET criteria. Normal appearance of the internal carotid artery. VERTEBRAL ARTERIES:Codominant vertebral arteries. Normal appearance of the vertebral arteries, widely patent. SKELETON: No acute osseous process though bone windows have not been submitted. OTHER NECK: Soft tissues of the neck are nonacute though, not tailored for evaluation. UPPER CHEST: Included lung apices are clear. No superior  mediastinal lymphadenopathy. CTA HEAD FINDINGS: ANTERIOR CIRCULATION: Patent cervical internal carotid arteries, petrous, cavernous and supra clinoid internal carotid arteries. Patent anterior communicating artery. Patent anterior and middle cerebral arteries, mild luminal irregularity. Developmentally smaller LEFT MCA. No large vessel occlusion, significant stenosis, contrast extravasation or aneurysm. POSTERIOR CIRCULATION: Patent vertebral arteries, vertebrobasilar junction and basilar artery, as well as main branch vessels. Patent posterior cerebral arteries mild luminal irregularity. No large vessel occlusion, significant stenosis, contrast extravasation or aneurysm. VENOUS SINUSES: Major dural venous sinuses are patent though not tailored for evaluation on this angiographic examination. ANATOMIC VARIANTS: None. DELAYED PHASE: Not performed. MIP images reviewed. IMPRESSION: CTA NECK: 1. RIGHT carotid bulb intramural hematoma, less likely atypical fibromuscular dysplasia. No flow-limiting stenosis. 2. Patent vertebral arteries. CTA HEAD: 1.  No emergent large vessel occlusion or flow-limiting stenosis. 2. Mild intracranial atherosclerosis, less likely vasculopathy. Critical Value/emergent results were called by telephone at the time of interpretation on 11/10/2017 at 9:45 pm to Dr. Roland Rack , who verbally acknowledged these results. Electronically Signed   By: Elon Alas M.D.   On: 11/10/2017 21:51   Ct Angio Neck W Or Wo Contrast  Result Date: 11/10/2017 CLINICAL DATA:  Acute onset lower extremity weakness, fell to RIGHT. Vision changes. EXAM: CT ANGIOGRAPHY HEAD AND NECK TECHNIQUE: Multidetector CT imaging of the head and neck was performed using the standard protocol during bolus administration of intravenous contrast. Multiplanar CT image reconstructions and MIPs were obtained to evaluate the vascular anatomy. Carotid stenosis measurements (when applicable) are obtained utilizing NASCET  criteria, using the distal internal carotid diameter as the denominator. COMPARISON:  CT HEAD November 10, 2017 at 9:33 p.m. FINDINGS: CTA NECK FINDINGS: AORTIC ARCH: Normal appearance of the thoracic arch, 2 vessel arch is a normal variant. The origins of the innominate, left Common carotid artery and subclavian artery are widely patent. RIGHT CAROTID SYSTEM: Common carotid artery is patent. Shelf-like filling defect RIGHT carotid bulb without hemodynamically significant stenosis by NASCET criteria. LEFT CAROTID SYSTEM: Common carotid artery is patent. Normal appearance of the carotid bifurcation without hemodynamically significant stenosis by NASCET criteria. Normal appearance of the internal carotid artery. VERTEBRAL ARTERIES:Codominant vertebral arteries. Normal appearance of the vertebral arteries, widely patent. SKELETON: No acute osseous process though bone windows have not been submitted. OTHER NECK: Soft tissues of the neck are nonacute though, not tailored for evaluation. UPPER CHEST: Included lung apices are clear. No superior mediastinal lymphadenopathy. CTA HEAD FINDINGS: ANTERIOR CIRCULATION: Patent cervical internal carotid arteries, petrous, cavernous and supra clinoid internal carotid arteries. Patent  anterior communicating artery. Patent anterior and middle cerebral arteries, mild luminal irregularity. Developmentally smaller LEFT MCA. No large vessel occlusion, significant stenosis, contrast extravasation or aneurysm. POSTERIOR CIRCULATION: Patent vertebral arteries, vertebrobasilar junction and basilar artery, as well as main branch vessels. Patent posterior cerebral arteries mild luminal irregularity. No large vessel occlusion, significant stenosis, contrast extravasation or aneurysm. VENOUS SINUSES: Major dural venous sinuses are patent though not tailored for evaluation on this angiographic examination. ANATOMIC VARIANTS: None. DELAYED PHASE: Not performed. MIP images reviewed. IMPRESSION:  CTA NECK: 1. RIGHT carotid bulb intramural hematoma, less likely atypical fibromuscular dysplasia. No flow-limiting stenosis. 2. Patent vertebral arteries. CTA HEAD: 1.  No emergent large vessel occlusion or flow-limiting stenosis. 2. Mild intracranial atherosclerosis, less likely vasculopathy. Critical Value/emergent results were called by telephone at the time of interpretation on 11/10/2017 at 9:45 pm to Dr. Roland Rack , who verbally acknowledged these results. Electronically Signed   By: Elon Alas M.D.   On: 11/10/2017 21:51   Ct Head Code Stroke Wo Contrast  Result Date: 11/10/2017 CLINICAL DATA:  Code stroke. Acute onset lower extremity weakness, felt to RIGHT. RIGHT vision changes. History of TIA. EXAM: CT HEAD WITHOUT CONTRAST TECHNIQUE: Contiguous axial images were obtained from the base of the skull through the vertex without intravenous contrast. COMPARISON:  None. FINDINGS: BRAIN: No intraparenchymal hemorrhage, mass effect nor midline shift. The ventricles and sulci are normal. No acute large vascular territory infarcts. No abnormal extra-axial fluid collections. 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 Basal cisterns are patent. VASCULAR: Unremarkable. SKULL/SOFT TISSUES: No skull fracture. No significant soft tissue swelling. ORBITS/SINUSES: The included ocular globes and orbital contents are normal.Trace paranasal sinus mucosal thickening. Mastoid air cells are well aerated. OTHER: None. ASPECTS [facility] Stroke Program Early CT Score) - Ganglionic level infarction (caudate, lentiform nuclei, internal capsule, insula, M1-M3 cortex): 7 - Supraganglionic infarction (M4-M6 cortex): 3 Total score (0-10 with 10 being normal): 10 IMPRESSION: 1. Negative non-contrast CT HEAD. 2. ASPECTS is 10. 3. Critical Value/emergent results text paged to [place], Neurology via AMION secure system on 11/10/2017 at 9:37 pm, including  interpreting physician's phone number. Electronically Signed   By: Elon Alas M.D.   On: 11/10/2017 21:38    Procedures Procedures (including critical care time)  Medications Ordered in ED Medications  iopamidol (ISOVUE-370) 76 % injection (has no administration in time range)  heparin ADULT infusion 100 units/mL (25000 units/264mL sodium chloride 0.45%) (has no administration in time range)  0.9 %  sodium chloride infusion (Manually program via Guardrails IV Fluids) (has no administration in time range)  iopamidol (ISOVUE-370) 76 % injection 50 mL (50 mLs Intravenous Contrast Given 11/10/17 2141)  ondansetron (ZOFRAN) injection 4 mg (4 mg Intravenous Given 11/10/17 2207)     Initial Impression / Assessment and Plan / ED Course  I have reviewed the triage vital signs and the nursing notes.  Pertinent labs & imaging results that were available during my care of the patient were reviewed by me and considered in my medical decision making (see chart for details).     Patient was seen by Dr. Leonel Ramsay and CTs were ordered.  There is no evidence of hemorrhage.  He is concerned about a carotid dissection with a clot in the carotid artery.  He had initially ordered heparin.  Her hemoglobin came back at 6.  He has ordered blood transfusion and feels like he likely will still continue the heparin.  Her other labs  are reviewed and are non-concerning.  I spoke with Dr. Stana Bunting who will admit the pt to the hospitalist service  CRITICAL CARE Performed by: Malvin Johns Total critical care time: 45 minutes Critical care time was exclusive of separately billable procedures and treating other patients. Critical care was necessary to treat or prevent imminent or life-threatening deterioration. Critical care was time spent personally by me on the following activities: development of treatment plan with patient and/or surrogate as well as nursing, discussions with consultants, evaluation of  patient's response to treatment, examination of patient, obtaining history from patient or surrogate, ordering and performing treatments and interventions, ordering and review of laboratory studies, ordering and review of radiographic studies, pulse oximetry and re-evaluation of patient's condition.   Final Clinical Impressions(s) / ED Diagnoses   Final diagnoses:  Left-sided weakness    ED Discharge Orders    None       Malvin Johns, MD 11/10/17 2323

## 2017-11-10 NOTE — ED Notes (Signed)
Critical HGB of 6.0 received from lab.  Dr. Tamera Punt made aware.  Informed to page neurology on patient updates

## 2017-11-10 NOTE — ED Notes (Signed)
Spoke with Dr. Leonel Ramsay about patient.  Will hold Heparin until further update.

## 2017-11-10 NOTE — ED Notes (Signed)
Neurology at bedside.

## 2017-11-10 NOTE — H&P (Signed)
History and Physical   Mallory Robinson:[id number] DOB: April 21, 1975 DOA: 11/10/2017  PCP: Patient, No Pcp Per  Chief Complaint: left sided weakness and right eye vision loss  HPI:  This is a 42 year old woman with medical problems including bipolar disorder no longer on medication, overweight, chronic anemia of uncertain cause, prior TIA who presents with left-sided weakness and right-sided vision loss.  History is obtained via patient report, emergency medicine team report, and chart review.  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  She fell.  She sought medical attention.  Associated symptoms include nausea, vomiting, lightheadedness upon standing, and fatigue.  She reports eating ice frequently over the past weeks.  Mild right-sided headache 3 out of 10.  She reports similar symptoms in the past when she used to live in [place].  She reports being hospitalized in October 2017 where she required transfusions of packed red blood cells.  Etiology of anemia is unknown at the time.  She reports having EGD but this was over 10 years ago to work-up epigastric discomfort.  She reports having menstrual periods on a regular basis, her menstrual periods last 4 to 7 days, she changes about 3 pads a day.  Does not believe her menses are particularly heavy.  Does not take any supplements or daily medications.  ED Course: In the emergency department, vital signs remarkable for systolic blood pressure in the 130s, normal heart rate.  White count of 17,000, predominance of neutrophils, hemoglobin 6, MCV 62, platelet clumping present therefore unable to estimate platelet count, polychromasia noted.  CMP remarkable for glucose 120, otherwise normal.  Troponin 0.  CT scan of the head without contrast did not reveal acute hemorrhage, no other acute findings.  CTA of the  head and neck performed which revealed right carotid bruit bulb intramural hematoma, no flow-limiting stenosis.  Head portion without large vessel occlusion or flow-limiting stenosis.  Neurology team consulted who recommended heparin drip initially, as well as transfusion of 2 units of packed red blood cells.  However, upon further review decision to anticoagulate deferred until a review of outside records available by the neurology team.  Hospital medicine consulted for further management.  Review of Systems: A complete ROS was obtained; pertinent positives negatives are denoted in the HPI. Otherwise, all systems are negative.   Past Medical History:  Diagnosis Date  . Bipolar 1 disorder ([place])   . Depression    Social History   Socioeconomic History  . Marital status: Married    Spouse name: Not on file  . Number of children: Not on file  . Years of education: Not on file  . Highest education level: Not on file  Occupational History  . Not on file  Social Needs  . Financial resource strain: Not on file  . Food insecurity:    Worry: Not on file    Inability: Not on file  . Transportation needs:    Medical: Not on file    Non-medical: Not on file  Tobacco Use  . Smoking status: Never Smoker  . Smokeless tobacco: Never Used  Substance and Sexual Activity  . Alcohol use: Yes    Comment: Occasional  . Drug use: No  . Sexual activity: Yes    Birth control/protection: None  Lifestyle  . Physical activity:    Days per week: Not on file  Minutes per session: Not on file  . Stress: Not on file  Relationships  . Social connections:    Talks on phone: Not on file    Gets together: Not on file    Attends religious service: Not on file    Active member of club or organization: Not on file    Attends meetings of clubs or organizations: Not on file    Relationship status: Not on file  . Intimate partner violence:    Fear of current or ex partner: Not on file    Emotionally  abused: Not on file    Physically abused: Not on file    Forced sexual activity: Not on file  Other Topics Concern  . Not on file  Social History Narrative  . Not on file   Family History  Problem Relation Age of Onset  . Hypertension Mother    Physical Exam: Vitals:   11/10/17 2130 11/10/17 2315 11/10/17 2330 11/10/17 2331  BP: (!) 138/96 (!) 166/98 (!) 138/96   Pulse: 80   84  Resp: 19 17 18 20   SpO2: 100%   100%  Weight:       General: Appears calm and comfortable overweight black woman ENT: Grossly normal hearing, MMM.  Cardiovascular: RRR. No M/R/G. No LE edema.  Respiratory: CTA bilaterally. No wheezes or crackles. Normal respiratory effort.  Breathing room air. Abdomen: Soft, non-tender. No guarding or rebound Skin: No rash or induration seen on limited exam. Multiple tattoos Musculoskeletal: Grossly normal tone BUE/BLE. Appropriate ROM.  Psychiatric: Grossly normal mood and affect. Neurologic: Notable for mild LUE and LLE proximal muscle weakness 4/5. Strength preserved elsewhere.  No other prominent focal deficits noted. Finger to nose WNL B/L.  I have personally reviewed the following labs, culture data, and imaging studies.  Assessment/Plan:  #Right ICA thrombus, suspected #Left sided weakness Course: prior to admission left sided weakness with right eye reduced vision; work up suggestive of right ICA thrombus; patient reports something similar ~2 years ago.  A/P: currently with mild reduction in strength on the left side, neurology team considering heparin gtt - they are awaiting outside record review; they relayed that if prior vessel abnormality seen at prior hospitalization they may not pursue heparin gtt. Will procure their recommended diagnostics - MRI brain, TTE, telemetry,  Carotid doppler, PT/OT/ST.  A1c and lipid profile.   #Platelet clumping Unable to estimate platelet count. This information will be important, particularly when deciding on heparin gtt.   I have spoken with the lab and bedside nurse personally - EDTA free (sodium citrate, "blue top") tube CBC ordered to be drawn to reduce platelet clumping to allow for plt count estimation ordered.  #Anemia, microcytic A/P: review of hb trend over past few years appear baseline 8-10; microcytic + polychromasia + increased RDW suggests iron deficiency; however, other causes not completely excluded.  Will order the following to further elucidate: retic count, iron studies, b12, TSH. If cause not apparent thereafter, consider hematology consultation.  Believe anemia is acute on chronic.  Folic acid 2 mg for hematopoiesis support.  #Other problems: -Bipolar disorder: patient reports off seroquel and lamictal over 1 year ago and that symptoms are well controlled -Overweight: present, outpatient weight optimization -Leukocytosis: neutrophil predominance; possibly reactive to acute illness. If does not down-trend may need to consider evaluation for myeloproliferative disorder such as CML  DVT prophylaxis: SCD given anemia and unknown platelet count Code Status: Full code after discussion on admission Disposition Plan: Anticipate D/C home  when medically stable Consults called: Neurology team consulted by emergency medicine team Admission status: admit to hospital medicine service, telemetry floor   Cheri Rous, MD [facility] Page:[phone]  If 7PM-7AM, please contact night-coverage [email or web address] Password [email or web address]  This document was created using the aid of voice recognition / dication software.

## 2017-11-10 NOTE — Significant Event (Signed)
Rapid Response Event Note  Code stroke called at 2058. Pt arrived at 2115, labs drawn, in CT at 2120. CTA done. Initial NIH 2 for weakness LLE and mild sensory loss on left. Dr Tobias Tyarra Nolton at bedside. LVO negative, R carotid hematoma noted on scan. Will plan for Heparin gtt and admission to hospital.    Sherilyn Dacosta

## 2017-11-11 ENCOUNTER — Observation Stay (HOSPITAL_BASED_OUTPATIENT_CLINIC_OR_DEPARTMENT_OTHER): Payer: Medicaid Other

## 2017-11-11 ENCOUNTER — Observation Stay (HOSPITAL_COMMUNITY): Payer: Medicaid Other

## 2017-11-11 DIAGNOSIS — H5461 Unqualified visual loss, right eye, normal vision left eye: Secondary | ICD-10-CM | POA: Diagnosis present

## 2017-11-11 DIAGNOSIS — I6340 Cerebral infarction due to embolism of unspecified cerebral artery: Secondary | ICD-10-CM | POA: Diagnosis not present

## 2017-11-11 DIAGNOSIS — D473 Essential (hemorrhagic) thrombocythemia: Secondary | ICD-10-CM | POA: Diagnosis not present

## 2017-11-11 DIAGNOSIS — R531 Weakness: Secondary | ICD-10-CM | POA: Diagnosis not present

## 2017-11-11 DIAGNOSIS — R0989 Other specified symptoms and signs involving the circulatory and respiratory systems: Secondary | ICD-10-CM | POA: Diagnosis present

## 2017-11-11 DIAGNOSIS — N920 Excessive and frequent menstruation with regular cycle: Secondary | ICD-10-CM | POA: Diagnosis not present

## 2017-11-11 DIAGNOSIS — I63131 Cerebral infarction due to embolism of right carotid artery: Secondary | ICD-10-CM | POA: Diagnosis not present

## 2017-11-11 DIAGNOSIS — I639 Cerebral infarction, unspecified: Secondary | ICD-10-CM

## 2017-11-11 DIAGNOSIS — S0990XA Unspecified injury of head, initial encounter: Secondary | ICD-10-CM | POA: Diagnosis not present

## 2017-11-11 DIAGNOSIS — D500 Iron deficiency anemia secondary to blood loss (chronic): Secondary | ICD-10-CM

## 2017-11-11 DIAGNOSIS — R29705 NIHSS score 5: Secondary | ICD-10-CM | POA: Diagnosis not present

## 2017-11-11 DIAGNOSIS — F319 Bipolar disorder, unspecified: Secondary | ICD-10-CM | POA: Diagnosis present

## 2017-11-11 DIAGNOSIS — D519 Vitamin B12 deficiency anemia, unspecified: Secondary | ICD-10-CM

## 2017-11-11 DIAGNOSIS — I63411 Cerebral infarction due to embolism of right middle cerebral artery: Secondary | ICD-10-CM | POA: Diagnosis present

## 2017-11-11 DIAGNOSIS — Z8673 Personal history of transient ischemic attack (TIA), and cerebral infarction without residual deficits: Secondary | ICD-10-CM | POA: Diagnosis not present

## 2017-11-11 DIAGNOSIS — D72829 Elevated white blood cell count, unspecified: Secondary | ICD-10-CM | POA: Diagnosis present

## 2017-11-11 DIAGNOSIS — I361 Nonrheumatic tricuspid (valve) insufficiency: Secondary | ICD-10-CM | POA: Diagnosis not present

## 2017-11-11 DIAGNOSIS — D259 Leiomyoma of uterus, unspecified: Secondary | ICD-10-CM | POA: Diagnosis not present

## 2017-11-11 DIAGNOSIS — E663 Overweight: Secondary | ICD-10-CM | POA: Diagnosis present

## 2017-11-11 DIAGNOSIS — D509 Iron deficiency anemia, unspecified: Secondary | ICD-10-CM | POA: Diagnosis present

## 2017-11-11 DIAGNOSIS — R29704 NIHSS score 4: Secondary | ICD-10-CM | POA: Diagnosis not present

## 2017-11-11 DIAGNOSIS — E538 Deficiency of other specified B group vitamins: Secondary | ICD-10-CM | POA: Diagnosis present

## 2017-11-11 DIAGNOSIS — Z6826 Body mass index (BMI) 26.0-26.9, adult: Secondary | ICD-10-CM | POA: Diagnosis not present

## 2017-11-11 DIAGNOSIS — Z8249 Family history of ischemic heart disease and other diseases of the circulatory system: Secondary | ICD-10-CM | POA: Diagnosis not present

## 2017-11-11 DIAGNOSIS — R29702 NIHSS score 2: Secondary | ICD-10-CM | POA: Diagnosis present

## 2017-11-11 DIAGNOSIS — I340 Nonrheumatic mitral (valve) insufficiency: Secondary | ICD-10-CM | POA: Diagnosis not present

## 2017-11-11 DIAGNOSIS — R29818 Other symptoms and signs involving the nervous system: Secondary | ICD-10-CM | POA: Diagnosis not present

## 2017-11-11 DIAGNOSIS — G8194 Hemiplegia, unspecified affecting left nondominant side: Secondary | ICD-10-CM | POA: Diagnosis present

## 2017-11-11 LAB — RETICULOCYTES
RBC.: 3.57 MIL/uL — ABNORMAL LOW (ref 3.87–5.11)
RETIC CT PCT: 1.7 % (ref 0.4–3.1)
Retic Count, Absolute: 60.7 10*3/uL (ref 19.0–186.0)

## 2017-11-11 LAB — CBC
HCT: 30.3 % — ABNORMAL LOW (ref 36.0–46.0)
HEMATOCRIT: 22.2 % — AB (ref 36.0–46.0)
HEMOGLOBIN: 8.6 g/dL — AB (ref 12.0–15.0)
Hemoglobin: 5.6 g/dL — CL (ref 12.0–15.0)
MCH: 15.4 pg — ABNORMAL LOW (ref 26.0–34.0)
MCH: 18.9 pg — ABNORMAL LOW (ref 26.0–34.0)
MCHC: 25.2 g/dL — ABNORMAL LOW (ref 30.0–36.0)
MCHC: 28.4 g/dL — ABNORMAL LOW (ref 30.0–36.0)
MCV: 61.2 fL — ABNORMAL LOW (ref 78.0–100.0)
MCV: 66.7 fL — AB (ref 78.0–100.0)
Platelets: 1301 10*3/uL (ref 150–400)
Platelets: 1170 10*3/uL (ref 150–400)
RBC: 3.63 MIL/uL — ABNORMAL LOW (ref 3.87–5.11)
RBC: 4.54 MIL/uL (ref 3.87–5.11)
RDW: 29.2 % — ABNORMAL HIGH (ref 11.5–15.5)
WBC: 16.4 10*3/uL — AB (ref 4.0–10.5)
WBC: 17.5 10*3/uL — AB (ref 4.0–10.5)

## 2017-11-11 LAB — BASIC METABOLIC PANEL
Anion gap: 10 (ref 5–15)
CALCIUM: 9 mg/dL (ref 8.9–10.3)
CHLORIDE: 106 mmol/L (ref 98–111)
CO2: 23 mmol/L (ref 22–32)
Creatinine, Ser: 0.76 mg/dL (ref 0.44–1.00)
GFR calc Af Amer: >60 mL/min (ref >60)
GFR calc non Af Amer: >60 mL/min (ref >60)
Glucose, Bld: 101 mg/dL — ABNORMAL HIGH (ref 70–99)
Potassium: 3.6 mmol/L (ref 3.5–5.1)
SODIUM: 139 mmol/L (ref 135–145)

## 2017-11-11 LAB — URINALYSIS, ROUTINE W REFLEX MICROSCOPIC
BACTERIA UA: NONE SEEN
Bilirubin Urine: NEGATIVE
Glucose, UA: NEGATIVE mg/dL
Ketones, ur: NEGATIVE mg/dL
Leukocytes, UA: NEGATIVE
NITRITE: NEGATIVE
Protein, ur: NEGATIVE mg/dL
SPECIFIC GRAVITY, URINE: 1.038 — AB (ref 1.005–1.030)
pH: 8 (ref 5.0–8.0)

## 2017-11-11 LAB — IRON AND TIBC
Iron: 9 ug/dL — ABNORMAL LOW (ref 28–170)
SATURATION RATIOS: 2 % — AB (ref 10.4–31.8)
TIBC: 587 ug/dL — AB (ref 250–450)
UIBC: 578 ug/dL

## 2017-11-11 LAB — RAPID URINE DRUG SCREEN, HOSP PERFORMED
AMPHETAMINES: NOT DETECTED
BARBITURATES: NOT DETECTED
Benzodiazepines: NOT DETECTED
COCAINE: NOT DETECTED
OPIATES: NOT DETECTED
TETRAHYDROCANNABINOL: NOT DETECTED

## 2017-11-11 LAB — ECHOCARDIOGRAM COMPLETE
Height: 63 in
Weight: 2640 oz

## 2017-11-11 LAB — FERRITIN: Ferritin: 3 ng/mL — ABNORMAL LOW (ref 11–307)

## 2017-11-11 LAB — HCG, QUANTITATIVE, PREGNANCY: hCG, Beta Chain, Quant, S: 1 m[IU]/mL (ref <5)

## 2017-11-11 LAB — HEPARIN LEVEL (UNFRACTIONATED)
HEPARIN UNFRACTIONATED: 0.17 [IU]/mL — AB (ref 0.30–0.70)
HEPARIN UNFRACTIONATED: 0.26 [IU]/mL — AB (ref 0.30–0.70)

## 2017-11-11 LAB — FIBRINOGEN: Fibrinogen: 241 mg/dL (ref 210–475)

## 2017-11-11 LAB — PREGNANCY, URINE: Preg Test, Ur: NEGATIVE

## 2017-11-11 LAB — GLUCOSE, CAPILLARY: Glucose-Capillary: 121 mg/dL — ABNORMAL HIGH (ref 70–99)

## 2017-11-11 LAB — VITAMIN B12: VITAMIN B 12: 259 pg/mL (ref 180–914)

## 2017-11-11 LAB — ABO/RH: ABO/RH(D): B POS

## 2017-11-11 MED ORDER — METOCLOPRAMIDE HCL 5 MG/ML IJ SOLN
10.0000 mg | INTRAMUSCULAR | Status: AC
  Administered 2017-11-11: 10 mg via INTRAVENOUS
  Filled 2017-11-11: qty 2

## 2017-11-11 MED ORDER — IOPAMIDOL (ISOVUE-370) INJECTION 76%: 100.0000 mL | Freq: Once | INTRAVENOUS | Status: DC | PRN

## 2017-11-11 MED ORDER — IOPAMIDOL (ISOVUE-370) INJECTION 76%
INTRAVENOUS | Status: AC
  Filled 2017-11-11: qty 100

## 2017-11-11 MED ORDER — ACETAMINOPHEN 325 MG PO TABS
650.0000 mg | ORAL_TABLET | Freq: Four times a day (QID) | ORAL | Status: DC | PRN
  Administered 2017-11-11 – 2017-11-14 (×6): 650 mg via ORAL
  Filled 2017-11-11 (×6): qty 2

## 2017-11-11 MED ORDER — CYANOCOBALAMIN 1000 MCG/ML IJ SOLN
1000.0000 ug | Freq: Every day | INTRAMUSCULAR | Status: DC
  Administered 2017-11-11 – 2017-11-12 (×2): 1000 ug via SUBCUTANEOUS
  Filled 2017-11-11 (×3): qty 1

## 2017-11-11 MED ORDER — ATORVASTATIN CALCIUM 80 MG PO TABS
80.0000 mg | ORAL_TABLET | Freq: Every day | ORAL | Status: DC
  Administered 2017-11-11 – 2017-11-12 (×2): 80 mg via ORAL
  Filled 2017-11-11 (×2): qty 1

## 2017-11-11 MED ORDER — SODIUM CHLORIDE 0.9 % IV SOLN
510.0000 mg | INTRAVENOUS | Status: DC
  Administered 2017-11-11: 510 mg via INTRAVENOUS
  Filled 2017-11-11: qty 17

## 2017-11-11 NOTE — ED Notes (Signed)
1L NS bolus given IV per verbal order Dr. Leonel Ramsay.

## 2017-11-11 NOTE — Progress Notes (Signed)
Received report from Wallace, Therapist, sports in [place]

## 2017-11-11 NOTE — Progress Notes (Signed)
STROKE TEAM PROGRESS NOTE   SUBJECTIVE (INTERVAL HISTORY) Her husband is at the bedside.  Overall she feels her condition is rapidly improving. She still complains of right leg weakness and right eye blurry vision. Denies any neck trauma or aggressive neck manipulations.     OBJECTIVE Temp:  [98.4 F (36.9 C)-99.5 F (37.5 C)] 99.1 F (37.3 C) (09/23 1442) Pulse Rate:  [72-96] 74 (09/23 1442) Resp:  [16-30] 19 (09/23 1442) BP: (131-169)/(80-117) 169/98 (09/23 1442) SpO2:  [99 %-100 %] 100 % (09/23 1442) FiO2 (%):  [21 %] 21 % (09/22 2339) Weight:  [66.8 kg-74.8 kg] 74.8 kg (09/23 0829)  Recent Labs  Lab 11/10/17 2118  GLUCAP 121*   Recent Labs  Lab 11/10/17 2125 11/10/17 2129 11/11/17 1111  NA 139 136 139  K 4.0 3.9 3.6  CL 104 104 106  CO2  --  22 23  GLUCOSE 117* 120* 101*  BUN 7 7 <5*  CREATININE 0.90 0.96 0.76  CALCIUM  --  9.1 9.0   Recent Labs  Lab 11/10/17 2129  AST 34  ALT 20  ALKPHOS 63  BILITOT 0.6  PROT 7.7  ALBUMIN 4.2   Recent Labs  Lab 11/10/17 2125 11/10/17 2129 11/11/17 0024 11/11/17 1111  WBC  --  16.8* 16.4* 17.5*  NEUTROABS  --  14.1*  --   --   HGB 8.2* 6.0* 5.6* 8.6*  HCT 24.0* 23.2* 22.2* 30.3*  MCV  --  61.9* 61.2* 66.7*  PLT  --  PLATELET CLUMPS NOTED ON SMEAR, UNABLE TO ESTIMATE 1,301* 1,170*   No results for input(s): CKTOTAL, CKMB, CKMBINDEX, TROPONINI in the last 168 hours. Recent Labs    11/10/17 2129  LABPROT 14.7  INR 1.16   Recent Labs    11/11/17 0235  COLORURINE YELLOW  LABSPEC 1.038*  PHURINE 8.0  GLUCOSEU NEGATIVE  HGBUR LARGE*  BILIRUBINUR NEGATIVE  KETONESUR NEGATIVE  PROTEINUR NEGATIVE  NITRITE NEGATIVE  LEUKOCYTESUR NEGATIVE    No results found for: CHOL, TRIG, HDL, CHOLHDL, VLDL, LDLCALC No results found for: HGBA1C    Component Value Date/Time   LABOPIA NONE DETECTED 11/11/2017 0235   COCAINSCRNUR NONE DETECTED 11/11/2017 0235   LABBENZ NONE DETECTED 11/11/2017 0235   AMPHETMU NONE  DETECTED 11/11/2017 0235   THCU NONE DETECTED 11/11/2017 0235   LABBARB NONE DETECTED 11/11/2017 0235    Recent Labs  Lab 11/10/17 2126  ETH <10    I have personally reviewed the radiological images below and agree with the radiology interpretations.  Ct Angio Head W Or Wo Contrast  Result Date: 11/11/2017 CLINICAL DATA:  42 y/o F; change in neuro exam. Stroke for follow-up. EXAM: CT ANGIOGRAPHY HEAD AND NECK CT PERFUSION BRAIN TECHNIQUE: Multidetector CT imaging of the head and neck was performed using the standard protocol during bolus administration of intravenous contrast. Multiplanar CT image reconstructions and MIPs were obtained to evaluate the vascular anatomy. Carotid stenosis measurements (when applicable) are obtained utilizing NASCET criteria, using the distal internal carotid diameter as the denominator. Multiphase CT imaging of the brain was performed following IV bolus contrast injection. Subsequent parametric perfusion maps were calculated using RAPID software. CONTRAST:  68mL ISOVUE-370 IOPAMIDOL (ISOVUE-370) INJECTION 76% 90 cc Isovue 370 COMPARISON:  None. 11/10/2017 CT angiogram of the head and neck. FINDINGS: CT HEAD FINDINGS Brain: No evidence of acute infarction, hemorrhage, hydrocephalus, extra-axial collection or mass lesion/mass effect. Vascular: No hyperdense vessel or unexpected calcification. Skull: Normal. Negative for fracture or focal lesion. Sinuses/Orbits: No  acute finding. Other: None. ASPECTS ([place] Stroke Program Early CT Score) - Ganglionic level infarction (caudate, lentiform nuclei, internal capsule, insula, M1-M3 cortex): 7 - Supraganglionic infarction (M4-M6 cortex): 3 Total score (0-10 with 10 being normal): 10 Review of the MIP images confirms the above findings CTA NECK FINDINGS Aortic arch: Bovine variant branching. Imaged portion shows no evidence of aneurysm or dissection. No significant stenosis of the major arch vessel origins. Right carotid system:  No evidence of dissection, stenosis (50% or greater) or occlusion. Focal posterior wall thickening of the left proximal ICA is less pronounced on the current study than on the prior CT angiogram of the neck (series 9, image 73). Left carotid system: No evidence of dissection, stenosis (50% or greater) or occlusion. Vertebral arteries: Codominant. No evidence of dissection, stenosis (50% or greater) or occlusion. Skeleton: No acute osseous abnormality. Other neck: Negative. Upper chest: Negative. Review of the MIP images confirms the above findings CTA HEAD FINDINGS Anterior circulation: No significant stenosis, proximal occlusion, aneurysm, or vascular malformation. Posterior circulation: No significant stenosis, proximal occlusion, aneurysm, or vascular malformation. Venous sinuses: As permitted by contrast timing, patent. Anatomic variants: None significant. Dominant right A1 and mildly larger right ICA likely due to increased flow when compared with the smaller left A1 and ICA. Review of the MIP images confirms the above findings CT Brain Perfusion Findings: CBF (<30%) Volume: 42mL Perfusion (Tmax>6.0s) volume: 41mL Mismatch Volume: 59mL Infarction Location:Not identified. There are a few foci of T-max greater than 4 seconds in the right MCA distribution. IMPRESSION: 1. Patent carotid and vertebral arteries. No dissection, aneurysm, or hemodynamically significant stenosis utilizing NASCET criteria. 2. Focal posterior wall thickening of the right proximal ICA is less pronounced on the current study than on the prior study. Findings may represent a soft plaque rupture. Additionally, given neck being held in flexion on the prior study, this finding may have represented a vessel kink. 3. Stable patent anterior and posterior intracranial circulation. No large vessel occlusion, aneurysm, or significant stenosis. 4. Stable negative noncontrast CT of the head. 5. No acute stroke or ischemic penumbra by CT perfusion  criteria. There are a few foci of T-max greater than 4 seconds in the right MCA distribution which may reflect small vessel ischemic changes. These results were called by telephone at the time of interpretation on 11/11/2017 at 3:30 am to Dr. Roland Rack , who verbally acknowledged these results. Electronically Signed   By: Kristine Garbe M.D.   On: 11/11/2017 03:29   Ct Angio Head W Or Wo Contrast  Result Date: 11/10/2017 CLINICAL DATA:  Acute onset lower extremity weakness, fell to RIGHT. Vision changes. EXAM: CT ANGIOGRAPHY HEAD AND NECK TECHNIQUE: Multidetector CT imaging of the head and neck was performed using the standard protocol during bolus administration of intravenous contrast. Multiplanar CT image reconstructions and MIPs were obtained to evaluate the vascular anatomy. Carotid stenosis measurements (when applicable) are obtained utilizing NASCET criteria, using the distal internal carotid diameter as the denominator. COMPARISON:  CT HEAD November 10, 2017 at 9:33 p.m. FINDINGS: CTA NECK FINDINGS: AORTIC ARCH: Normal appearance of the thoracic arch, 2 vessel arch is a normal variant. The origins of the innominate, left Common carotid artery and subclavian artery are widely patent. RIGHT CAROTID SYSTEM: Common carotid artery is patent. Shelf-like filling defect RIGHT carotid bulb without hemodynamically significant stenosis by NASCET criteria. LEFT CAROTID SYSTEM: Common carotid artery is patent. Normal appearance of the carotid bifurcation without hemodynamically significant stenosis by NASCET criteria. Normal  appearance of the internal carotid artery. VERTEBRAL ARTERIES:Codominant vertebral arteries. Normal appearance of the vertebral arteries, widely patent. SKELETON: No acute osseous process though bone windows have not been submitted. OTHER NECK: Soft tissues of the neck are nonacute though, not tailored for evaluation. UPPER CHEST: Included lung apices are clear. No superior  mediastinal lymphadenopathy. CTA HEAD FINDINGS: ANTERIOR CIRCULATION: Patent cervical internal carotid arteries, petrous, cavernous and supra clinoid internal carotid arteries. Patent anterior communicating artery. Patent anterior and middle cerebral arteries, mild luminal irregularity. Developmentally smaller LEFT MCA. No large vessel occlusion, significant stenosis, contrast extravasation or aneurysm. POSTERIOR CIRCULATION: Patent vertebral arteries, vertebrobasilar junction and basilar artery, as well as main branch vessels. Patent posterior cerebral arteries mild luminal irregularity. No large vessel occlusion, significant stenosis, contrast extravasation or aneurysm. VENOUS SINUSES: Major dural venous sinuses are patent though not tailored for evaluation on this angiographic examination. ANATOMIC VARIANTS: None. DELAYED PHASE: Not performed. MIP images reviewed. IMPRESSION: CTA NECK: 1. RIGHT carotid bulb intramural hematoma, less likely atypical fibromuscular dysplasia. No flow-limiting stenosis. 2. Patent vertebral arteries. CTA HEAD: 1.  No emergent large vessel occlusion or flow-limiting stenosis. 2. Mild intracranial atherosclerosis, less likely vasculopathy. Critical Value/emergent results were called by telephone at the time of interpretation on 11/10/2017 at 9:45 pm to Dr. Roland Rack , who verbally acknowledged these results. Electronically Signed   By: Elon Alas M.D.   On: 11/10/2017 21:51   Ct Angio Neck W Or Wo Contrast  Result Date: 11/11/2017 CLINICAL DATA:  42 y/o F; change in neuro exam. Stroke for follow-up. EXAM: CT ANGIOGRAPHY HEAD AND NECK CT PERFUSION BRAIN TECHNIQUE: Multidetector CT imaging of the head and neck was performed using the standard protocol during bolus administration of intravenous contrast. Multiplanar CT image reconstructions and MIPs were obtained to evaluate the vascular anatomy. Carotid stenosis measurements (when applicable) are obtained utilizing  NASCET criteria, using the distal internal carotid diameter as the denominator. Multiphase CT imaging of the brain was performed following IV bolus contrast injection. Subsequent parametric perfusion maps were calculated using RAPID software. CONTRAST:  66mL ISOVUE-370 IOPAMIDOL (ISOVUE-370) INJECTION 76% 90 cc Isovue 370 COMPARISON:  None. 11/10/2017 CT angiogram of the head and neck. FINDINGS: CT HEAD FINDINGS Brain: No evidence of acute infarction, hemorrhage, hydrocephalus, extra-axial collection or mass lesion/mass effect. Vascular: No hyperdense vessel or unexpected calcification. Skull: Normal. Negative for fracture or focal lesion. Sinuses/Orbits: No acute finding. Other: None. ASPECTS ([place] Stroke Program Early CT Score) - Ganglionic level infarction (caudate, lentiform nuclei, internal capsule, insula, M1-M3 cortex): 7 - Supraganglionic infarction (M4-M6 cortex): 3 Total score (0-10 with 10 being normal): 10 Review of the MIP images confirms the above findings CTA NECK FINDINGS Aortic arch: Bovine variant branching. Imaged portion shows no evidence of aneurysm or dissection. No significant stenosis of the major arch vessel origins. Right carotid system: No evidence of dissection, stenosis (50% or greater) or occlusion. Focal posterior wall thickening of the left proximal ICA is less pronounced on the current study than on the prior CT angiogram of the neck (series 9, image 73). Left carotid system: No evidence of dissection, stenosis (50% or greater) or occlusion. Vertebral arteries: Codominant. No evidence of dissection, stenosis (50% or greater) or occlusion. Skeleton: No acute osseous abnormality. Other neck: Negative. Upper chest: Negative. Review of the MIP images confirms the above findings CTA HEAD FINDINGS Anterior circulation: No significant stenosis, proximal occlusion, aneurysm, or vascular malformation. Posterior circulation: No significant stenosis, proximal occlusion, aneurysm, or vascular  malformation. Venous sinuses:  As permitted by contrast timing, patent. Anatomic variants: None significant. Dominant right A1 and mildly larger right ICA likely due to increased flow when compared with the smaller left A1 and ICA. Review of the MIP images confirms the above findings CT Brain Perfusion Findings: CBF (<30%) Volume: 54mL Perfusion (Tmax>6.0s) volume: 27mL Mismatch Volume: 72mL Infarction Location:Not identified. There are a few foci of T-max greater than 4 seconds in the right MCA distribution. IMPRESSION: 1. Patent carotid and vertebral arteries. No dissection, aneurysm, or hemodynamically significant stenosis utilizing NASCET criteria. 2. Focal posterior wall thickening of the right proximal ICA is less pronounced on the current study than on the prior study. Findings may represent a soft plaque rupture. Additionally, given neck being held in flexion on the prior study, this finding may have represented a vessel kink. 3. Stable patent anterior and posterior intracranial circulation. No large vessel occlusion, aneurysm, or significant stenosis. 4. Stable negative noncontrast CT of the head. 5. No acute stroke or ischemic penumbra by CT perfusion criteria. There are a few foci of T-max greater than 4 seconds in the right MCA distribution which may reflect small vessel ischemic changes. These results were called by telephone at the time of interpretation on 11/11/2017 at 3:30 am to Dr. Roland Rack , who verbally acknowledged these results. Electronically Signed   By: Kristine Garbe M.D.   On: 11/11/2017 03:29   Ct Angio Neck W Or Wo Contrast  Result Date: 11/10/2017 CLINICAL DATA:  Acute onset lower extremity weakness, fell to RIGHT. Vision changes. EXAM: CT ANGIOGRAPHY HEAD AND NECK TECHNIQUE: Multidetector CT imaging of the head and neck was performed using the standard protocol during bolus administration of intravenous contrast. Multiplanar CT image reconstructions and MIPs were  obtained to evaluate the vascular anatomy. Carotid stenosis measurements (when applicable) are obtained utilizing NASCET criteria, using the distal internal carotid diameter as the denominator. COMPARISON:  CT HEAD November 10, 2017 at 9:33 p.m. FINDINGS: CTA NECK FINDINGS: AORTIC ARCH: Normal appearance of the thoracic arch, 2 vessel arch is a normal variant. The origins of the innominate, left Common carotid artery and subclavian artery are widely patent. RIGHT CAROTID SYSTEM: Common carotid artery is patent. Shelf-like filling defect RIGHT carotid bulb without hemodynamically significant stenosis by NASCET criteria. LEFT CAROTID SYSTEM: Common carotid artery is patent. Normal appearance of the carotid bifurcation without hemodynamically significant stenosis by NASCET criteria. Normal appearance of the internal carotid artery. VERTEBRAL ARTERIES:Codominant vertebral arteries. Normal appearance of the vertebral arteries, widely patent. SKELETON: No acute osseous process though bone windows have not been submitted. OTHER NECK: Soft tissues of the neck are nonacute though, not tailored for evaluation. UPPER CHEST: Included lung apices are clear. No superior mediastinal lymphadenopathy. CTA HEAD FINDINGS: ANTERIOR CIRCULATION: Patent cervical internal carotid arteries, petrous, cavernous and supra clinoid internal carotid arteries. Patent anterior communicating artery. Patent anterior and middle cerebral arteries, mild luminal irregularity. Developmentally smaller LEFT MCA. No large vessel occlusion, significant stenosis, contrast extravasation or aneurysm. POSTERIOR CIRCULATION: Patent vertebral arteries, vertebrobasilar junction and basilar artery, as well as main branch vessels. Patent posterior cerebral arteries mild luminal irregularity. No large vessel occlusion, significant stenosis, contrast extravasation or aneurysm. VENOUS SINUSES: Major dural venous sinuses are patent though not tailored for evaluation on  this angiographic examination. ANATOMIC VARIANTS: None. DELAYED PHASE: Not performed. MIP images reviewed. IMPRESSION: CTA NECK: 1. RIGHT carotid bulb intramural hematoma, less likely atypical fibromuscular dysplasia. No flow-limiting stenosis. 2. Patent vertebral arteries. CTA HEAD: 1.  No emergent large  vessel occlusion or flow-limiting stenosis. 2. Mild intracranial atherosclerosis, less likely vasculopathy. Critical Value/emergent results were called by telephone at the time of interpretation on 11/10/2017 at 9:45 pm to Dr. Roland Rack , who verbally acknowledged these results. Electronically Signed   By: Elon Alas M.D.   On: 11/10/2017 21:51   Mr Brain Wo Contrast  Result Date: 11/11/2017 CLINICAL DATA:  42 y/o F; change in neuro exam, evaluation for stroke. Follow-up of leg numbness and headache. EXAM: MRI HEAD WITHOUT CONTRAST TECHNIQUE: Multiplanar, multiecho pulse sequences of the brain and surrounding structures were obtained without intravenous contrast. COMPARISON:  11/11/2017 CT head, CTA head, CT perfusion head. FINDINGS: Brain: There several scattered subcentimeter foci of reduced diffusion throughout the right frontal and parietal cortices, right temporoparietal junction, right frontal periventricular white matter, and the right caudate nucleus compatible with acute/early subacute infarction. No associated hemorrhage or mass effect. No extra-axial collection, hydrocephalus, focal mass effect of the brain, effacement of basilar cisterns, or herniation. Vascular: Normal flow voids. Skull and upper cervical spine: Normal marrow signal. Sinuses/Orbits: Negative. Other: None. 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 No associated hemorrhage or mass effect. These results were called by telephone at the time of interpretation on 11/11/2017 at 5:41 am to Dr. Leonel Ramsay, who verbally acknowledged these  results. Electronically Signed   By: Kristine Garbe M.D.   On: 11/11/2017 05:45   Ct Cerebral Perfusion W Contrast  Result Date: 11/11/2017 CLINICAL DATA:  42 y/o F; change in neuro exam. Stroke for follow-up. EXAM: CT ANGIOGRAPHY HEAD AND NECK CT PERFUSION BRAIN TECHNIQUE: Multidetector CT imaging of the head and neck was performed using the standard protocol during bolus administration of intravenous contrast. Multiplanar CT image reconstructions and MIPs were obtained to evaluate the vascular anatomy. Carotid stenosis measurements (when applicable) are obtained utilizing NASCET criteria, using the distal internal carotid diameter as the denominator. Multiphase CT imaging of the brain was performed following IV bolus contrast injection. Subsequent parametric perfusion maps were calculated using RAPID software. CONTRAST:  42mL ISOVUE-370 IOPAMIDOL (ISOVUE-370) INJECTION 76% 90 cc Isovue 370 COMPARISON:  None. 11/10/2017 CT angiogram of the head and neck. FINDINGS: CT HEAD FINDINGS Brain: No evidence of acute infarction, hemorrhage, hydrocephalus, extra-axial collection or mass lesion/mass effect. Vascular: No hyperdense vessel or unexpected calcification. Skull: Normal. Negative for fracture or focal lesion. Sinuses/Orbits: No acute finding. Other: None. ASPECTS ([place] Stroke Program Early CT Score) - Ganglionic level infarction (caudate, lentiform nuclei, internal capsule, insula, M1-M3 cortex): 7 - Supraganglionic infarction (M4-M6 cortex): 3 Total score (0-10 with 10 being normal): 10 Review of the MIP images confirms the above findings CTA NECK FINDINGS Aortic arch: Bovine variant branching. Imaged portion shows no evidence of aneurysm or dissection. No significant stenosis of the major arch vessel origins. Right carotid system: No evidence of dissection, stenosis (50% or greater) or occlusion. Focal posterior wall thickening of the left proximal ICA is less pronounced on the current study than  on the prior CT angiogram of the neck (series 9, image 73). Left carotid system: No evidence of dissection, stenosis (50% or greater) or occlusion. Vertebral arteries: Codominant. No evidence of dissection, stenosis (50% or greater) or occlusion. Skeleton: No acute osseous abnormality. Other neck: Negative. Upper chest: Negative. Review of the MIP images confirms the above findings CTA HEAD FINDINGS Anterior circulation: No significant stenosis, proximal occlusion, aneurysm, or vascular malformation. Posterior circulation: No significant stenosis, proximal occlusion, aneurysm, or vascular malformation. Venous sinuses:  As permitted by contrast timing, patent. Anatomic variants: None significant. Dominant right A1 and mildly larger right ICA likely due to increased flow when compared with the smaller left A1 and ICA. Review of the MIP images confirms the above findings CT Brain Perfusion Findings: CBF (<30%) Volume: 25mL Perfusion (Tmax>6.0s) volume: 15mL Mismatch Volume: 83mL Infarction Location:Not identified. There are a few foci of T-max greater than 4 seconds in the right MCA distribution. IMPRESSION: 1. Patent carotid and vertebral arteries. No dissection, aneurysm, or hemodynamically significant stenosis utilizing NASCET criteria. 2. Focal posterior wall thickening of the right proximal ICA is less pronounced on the current study than on the prior study. Findings may represent a soft plaque rupture. Additionally, given neck being held in flexion on the prior study, this finding may have represented a vessel kink. 3. Stable patent anterior and posterior intracranial circulation. No large vessel occlusion, aneurysm, or significant stenosis. 4. Stable negative noncontrast CT of the head. 5. No acute stroke or ischemic penumbra by CT perfusion criteria. There are a few foci of T-max greater than 4 seconds in the right MCA distribution which may reflect small vessel ischemic changes. These results were called by  telephone at the time of interpretation on 11/11/2017 at 3:30 am to Dr. Roland Rack , who verbally acknowledged these results. Electronically Signed   By: Kristine Garbe M.D.   On: 11/11/2017 03:29   Ct Head Code Stroke Wo Contrast  Result Date: 11/10/2017 CLINICAL DATA:  Code stroke. Acute onset lower extremity weakness, felt to RIGHT. RIGHT vision changes. History of TIA. EXAM: CT HEAD WITHOUT CONTRAST TECHNIQUE: Contiguous axial images were obtained from the base of the skull through the vertex without intravenous contrast. COMPARISON:  None. FINDINGS: BRAIN: No intraparenchymal hemorrhage, mass effect nor midline shift. The ventricles and sulci are normal. No acute large vascular territory infarcts. No abnormal extra-axial fluid collections. 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 Basal cisterns are patent. VASCULAR: Unremarkable. SKULL/SOFT TISSUES: No skull fracture. No significant soft tissue swelling. ORBITS/SINUSES: The included ocular globes and orbital contents are normal.Trace paranasal sinus mucosal thickening. Mastoid air cells are well aerated. OTHER: None. ASPECTS [facility] Stroke Program Early CT Score) - Ganglionic level infarction (caudate, lentiform nuclei, internal capsule, insula, M1-M3 cortex): 7 - Supraganglionic infarction (M4-M6 cortex): 3 Total score (0-10 with 10 being normal): 10 IMPRESSION: 1. Negative non-contrast CT HEAD. 2. ASPECTS is 10. 3. Critical Value/emergent results text paged to [facility], Neurology via [employer] secure system on 11/10/2017 at 9:37 pm, including interpreting physician's phone number. Electronically Signed   By: Elon Alas M.D.   On: 11/10/2017 21:38   TTE  - Left ventricle: The cavity size was normal. Systolic function was   normal. The estimated ejection fraction was in the range of 60%   to 65%. Wall motion was normal; there were no regional wall   motion  abnormalities. Left ventricular diastolic function   parameters were normal for the patient&'s age.   PHYSICAL EXAM  Temp:  [98.4 F (36.9 C)-99.5 F (37.5 C)] 99.1 F (37.3 C) (09/23 1442) Pulse Rate:  [72-96] 74 (09/23 1442) Resp:  [16-30] 19 (09/23 1442) BP: (131-169)/(80-117) 169/98 (09/23 1442) SpO2:  [99 %-100 %] 100 % (09/23 1442) FiO2 (%):  [21 %] 21 % (09/22 2339) Weight:  [66.8 kg-74.8 kg] 74.8 kg (09/23 0829)  General - Well nourished, well developed, in no apparent distress.  Ophthalmologic - fundi not visualized due to noncooperation.  Cardiovascular - Regular rate and rhythm.  Mental Status -  Level of arousal and orientation to time, place, and person were intact. Language including expression, naming, repetition, comprehension was assessed and found intact. Attention span and concentration were normal. Fund of Knowledge was assessed and was intact.  Cranial Nerves II - XII - II - Visual field intact OS, right eye decreased visual acuity but still able to [facility]. III, IV, VI - Extraocular movements intact. V - Facial sensation intact bilaterally. VII - Facial movement intact bilaterally. VIII - Hearing & vestibular intact bilaterally. X - Palate elevates symmetrically. XI - Chin turning & shoulder shrug intact bilaterally. XII - Tongue protrusion intact.  Motor Strength - The patient's strength was normal in all extremities and pronator drift was absent except LLE 4/5 with giveaway weakness.  Bulk was normal and fasciculations were absent.   Motor Tone - Muscle tone was assessed at the neck and appendages and was normal.  Reflexes - The patient's reflexes were symmetrical in all extremities and she had no pathological reflexes.  Sensory - Light touch, temperature/pinprick were assessed and were decreased sensation on the LLE with 50% compare to right.    Coordination - The patient had normal movements in the hands and feet with no ataxia or dysmetria.  Tremor  was absent.  Gait and Station - deferred.   ASSESSMENT/PLAN Ms. JADIRA NIERMAN is a 42 y.o. female with history of TIA, depression, bipolar admitted for left sided weakness, numbness, and right eye vision loss. No tPA given due to outside window.    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  Continue heparin IV, once neuro and anemia stable will transition to Coumadin with INR goal 2-3.  Repeat CTA neck in 2 to 3 months, if carotid thrombus resolved, Coumadin can be discontinued and transition to aspirin.  Patient counseled to be compliant with her antithrombotic medications  Ongoing aggressive stroke risk factor management  Therapy recommendations: Pending  Disposition: Pending  Right ICA/CCA thrombus  Etiology unclear  We will check hypercoagulable work-up  We will do TEE  We will check LE venous Doppler  Continue heparin IV  Severe anemia  Likely due to uterus fibroids  Likely chronic  Hemoglobin 8.2-6.0-5.6-PRBC-8.6  Monitor H&H  PRBC transfusion if hemoglobin less than 7  Hypertension . Stable on the high side . Permissive hypertension (OK if <180/105) for 24-48 hours post stroke and then gradually normalized within 5-7 days.  Long term BP goal normotensive  Hyperlipidemia  Home meds: None  LDL pending, goal < 70  Now on Lipitor 80  Continue statin at discharge  Other Stroke Risk Factors  Hx stroke/TIA  Other Active Problems  Depression  Bipolar  Hospital day # 0  I spent   35 minutes in total face-to-face time with the patient, more than 50% of which was spent in counseling and coordination of care, reviewing test results, images and medication, and discussing the diagnosis of carotid thrombus, severe anemia, hypertension, treatment plan and potential  prognosis. This patient's care requiresreview of multiple databases, neurological assessment, discussion with family, other specialists and medical decision making of high complexity. I discussed with Dr. Leonel Ramsay at hallway and Dr. Sloan Leiter over the phone.   Rosalin Hawking, MD PhD Stroke Neurology 11/11/2017 4:03 PM    To contact Stroke Continuity provider, please refer to [email or web address] After hours, contact General Neurology

## 2017-11-11 NOTE — Progress Notes (Signed)
*  Preliminary Results* Carotid artery duplex has been completed. Bilateral internal carotid arteries are 40-59% stenosis. Vertebral arteries are patent with antegrade flow.  11/11/2017 1:43 PM  Mallory Robinson Dawna Part

## 2017-11-11 NOTE — ED Notes (Signed)
Pt transported to CT with this RN 

## 2017-11-11 NOTE — ED Notes (Signed)
Unable to get blood at this moment due to pt getting blood

## 2017-11-11 NOTE — Progress Notes (Signed)
  Echocardiogram 2D Echocardiogram has been performed.  Mallory Robinson 11/11/2017, 9:48 AM

## 2017-11-11 NOTE — Progress Notes (Signed)
ANTICOAGULATION CONSULT NOTE  Pharmacy Consult for heparin Indication: stroke protocol  Heparin Dosing Weight: 66.8 kg  Labs: Recent Labs    11/10/17 2125 11/10/17 2129 11/11/17 0024 11/11/17 1111  HGB 8.2* 6.0* 5.6*  --   HCT 24.0* 23.2* 22.2*  --   PLT  --  PLATELET CLUMPS NOTED ON SMEAR, UNABLE TO ESTIMATE 1,301*  --   APTT  --  26  --   --   LABPROT  --  14.7  --   --   INR  --  1.16  --   --   HEPARINUNFRC  --   --   --  0.17*  CREATININE 0.90 0.96  --   --     Assessment: Mallory Robinson presenting as code stroke. Pharmacy consulted by Neurology to dose heparin per stroke protocol. Not on anticoagulation PTA. CBC pending. No bleed documented. Will target lower goal with no boluses per protocol.  Heparin level delayed due to receiving blood. Heparin level 0.17, Will increase.   Goal of Therapy:  Heparin level 0.3-0.5 units/ml, no boluses Monitor platelets by anticoagulation protocol: Yes   Plan:  No boluses. Increase heparin to  1100 units/h 6h heparin level Daily heparin level/CBC Monitor s/sx bleeding F/u Neuro plans  Javeon Macmurray A. Levada Dy, PharmD, [place] Pager: [phone] Please utilize Amion for appropriate phone number to reach the unit pharmacist ([place])   11/11/2017 12:09 PM

## 2017-11-11 NOTE — ED Notes (Signed)
Called back into the room by family stating pt not acting right. Left sided facial droop noted, pt unable to raise left leg, sensation decreased on left arm. Dr. Leonel Ramsay paged.

## 2017-11-11 NOTE — Progress Notes (Signed)
ANTICOAGULATION CONSULT NOTE - Follow Up  Pharmacy Consult for heparin Indication: stroke protocol  Heparin Dosing Weight: 66.8 kg  Labs: Recent Labs    11/10/17 2125 11/10/17 2129 11/11/17 0024 11/11/17 1111 11/11/17 1818  HGB 8.2* 6.0* 5.6* 8.6*  --   HCT 24.0* 23.2* 22.2* 30.3*  --   PLT  --  PLATELET CLUMPS NOTED ON SMEAR, UNABLE TO ESTIMATE 1,301* 1,170*  --   APTT  --  26  --   --   --   LABPROT  --  14.7  --   --   --   INR  --  1.16  --   --   --   HEPARINUNFRC  --   --   --  0.17* 0.26*  CREATININE 0.90 0.96  --  0.76  --     Assessment: 42 yo F presenting as code stroke. Pharmacy consulted by Neurology to dose heparin per stroke protocol. Not on anticoagulation PTA. Will target lower goal with no boluses per protocol.  Heparin level subtherapeutic on 1100 units/hr.  Pt currently with heavy bleeding on menstrual cycle but otherwise no bleeding noted.  Hgb improved 5.6 > 8.6 s/p transfusion.    Goal of Therapy:  Heparin level 0.3-0.5 units/ml, no boluses Monitor platelets by anticoagulation protocol: Yes   Plan:  No boluses.  Increase heparin to 1300 units/hr 6h heparin level Daily heparin level/CBC Monitor s/sx bleeding F/u Neuro plans - A1c, lipid panel, TEE, LE doppler  Gavriel Holzhauer, Pharm.D., BCPS Clinical Pharmacist Pager: [phone] Clinical phone for 11/11/2017 is [phone].  **Pharmacist phone directory can now be found on [email or web address] ([email or web address]).  Listed under [place].  11/11/2017 7:52 PM

## 2017-11-11 NOTE — Progress Notes (Signed)
PT Cancellation Note  Patient Details Name: Mallory Robinson MRN: [id number] DOB: Jun 26, 1975   Cancelled Treatment:    Reason Eval/Treat Not Completed: Medical issues which prohibited therapy RN requests PT hold for the morning due to low Hgb/patient being very fatigued, reports she is currently receiving blood and may be OK to participate in PM. Will attempt to return if schedule and time allow.    Deniece Ree PT, DPT, CBIS  Supplemental Physical Therapist [facility]    Pager [phone] Acute Rehab Office [phone]

## 2017-11-11 NOTE — ED Notes (Signed)
Pt c/o leg numbness and headache. Stana Bunting, MD paged.

## 2017-11-11 NOTE — Progress Notes (Addendum)
PROGRESS NOTE        PATIENT DETAILS Name: Mallory Robinson Age: 42 y.o. Sex: female Date of Birth: 28-Mar-1975 Admit Date: 11/10/2017 Admitting Physician Vilma Prader, MD JAS:[id number], No Pcp Per  Brief Narrative: Patient is a 42 y.o. female who claims to have prior history of hypertension (claims she was taken off antihypertensives a few years back), long-standing history of menorrhagia, fibroids presented with left-sided weakness, found to have acute CVA-however upon further evaluation found to have severe microcytic anemia with hemoglobin of 6.0.  See below for further details  Subjective: Claims left-sided weakness has improved.  Claims she has more sensation in the left leg as compared to yesterday.   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  She still has some mild left-sided weakness but has improved compared to initial presentation.  Await A1c, lipid panel.  Await echocardiogram.  Await further recommendations from neurology/stroke team.  Severe microcytic anemia: Secondary to iron deficiency in the setting of menorrhagia.  Has known history of fibroids.  Per patient her menstrual periods last for close to 1 week, and on Day 3 it is particularly heavy-where she has to use more than 5-7 pads.  Have asked pharmacy to dose IV iron-has received 2 units of PRBC transfusion-we will check posttransfusion CBC.  Follow.  Fibroids: See above regarding history of menorrhagia-she apparently is currently on..  Will discuss with GYN over the next few days to see if she is a candidate for hormonal treatment.  Vitamin B12 deficiency: Vitamin B12 levels at 259-since has both iron and vitamin B12 deficiency-may have an absorption issue (although has history of menorrhagia causing iron deficiency).  Begin vitamin B12 supplementation-further work-up to be done in the  outpatient setting.  Thrombocytosis: Reactive-likely in the setting of iron deficiency.  Should improve with treatment of underlying iron deficiency anemia.  Bipolar disorder: Currently not on any mood stabilizing agents at this point.  DVT Prophylaxis: IV heparin  Code Status: Full code   Family Communication: Mother at bedside  Disposition Plan: Remain inpatient-await completion of work-up and PT eval to determine disposition  Antimicrobial agents: Anti-infectives (From admission, onward)   None      Procedures: None  CONSULTS:  neurology  Time spent: 25 minutes-Greater than 50% of this time was spent in counseling, explanation of diagnosis, planning of further management, and coordination of care.  MEDICATIONS: Scheduled Meds: .  stroke: mapping our early stages of recovery book   Does not apply Once  . sodium chloride   Intravenous Once  . folic acid  2 mg Oral Daily  . iopamidol      . sodium chloride flush  3 mL Intravenous Q12H   Continuous Infusions: . heparin 900 Units/hr (11/11/17 0228)   PRN Meds:.iopamidol   PHYSICAL EXAM: Vital signs: Vitals:   11/11/17 0715 11/11/17 0730 11/11/17 0829 11/11/17 0912  BP: (!) 145/94 (!) 150/102 (!) 153/95 134/80  Pulse: 85 85 74 72  Resp: (!) 25 (!) 22 (!) 22 20  Temp:   99.5 F (37.5 C) 98.9 F (37.2 C)  TempSrc:   Oral Oral  SpO2: 100% 100% 100% 100%  Weight:   74.8 kg   Height:   5\' 3"  (1.6 m)    [employer]  11/10/17 2127 11/11/17 0829  Weight: 66.8 kg 74.8 kg   Body mass index is 29.23 kg/m.   General appearance :Awake, alert, not in any distress. HEENT: Atraumatic and Normocephalic Neck: supple Resp:Good air entry bilaterally, no added sounds  CVS: S1 S2 regular, no murmurs.  GI: Bowel sounds present, Non tender and not distended with no gaurding, rigidity or rebound. Extremities: B/L Lower Ext shows no edema, both legs are warm to touch Neurology: Mild-4/5 weakness-on the left  side Musculoskeletal:No digital cyanosis Skin:No Rash, warm and dry Wounds:N/A  I have personally reviewed following labs and imaging studies  LABORATORY DATA: CBC: Recent Labs  Lab 11/10/17 2125 11/10/17 2129 11/11/17 0024  WBC  --  16.8* 16.4*  NEUTROABS  --  14.1*  --   HGB 8.2* 6.0* 5.6*  HCT 24.0* 23.2* 22.2*  MCV  --  61.9* 61.2*  PLT  --  PLATELET CLUMPS NOTED ON SMEAR, UNABLE TO ESTIMATE 1,301*    Basic Metabolic Panel: Recent Labs  Lab 11/10/17 2125 11/10/17 2129  NA 139 136  K 4.0 3.9  CL 104 104  CO2  --  22  GLUCOSE 117* 120*  BUN 7 7  CREATININE 0.90 0.96  CALCIUM  --  9.1    GFR: Estimated Creatinine Clearance: 73.2 mL/min (by C-G formula based on SCr of 0.96 mg/dL).  Liver Function Tests: Recent Labs  Lab 11/10/17 2129  AST 34  ALT 20  ALKPHOS 63  BILITOT 0.6  PROT 7.7  ALBUMIN 4.2   No results for input(s): LIPASE, AMYLASE in the last 168 hours. No results for input(s): AMMONIA in the last 168 hours.  Coagulation Profile: Recent Labs  Lab 11/10/17 2129  INR 1.16    Cardiac Enzymes: No results for input(s): CKTOTAL, CKMB, CKMBINDEX, TROPONINI in the last 168 hours.  BNP (last 3 results) No results for input(s): PROBNP in the last 8760 hours.  HbA1C: No results for input(s): HGBA1C in the last 72 hours.  CBG: Recent Labs  Lab 11/10/17 2118  GLUCAP 121*    Lipid Profile: No results for input(s): CHOL, HDL, LDLCALC, TRIG, CHOLHDL, LDLDIRECT in the last 72 hours.  Thyroid Function Tests: No results for input(s): TSH, T4TOTAL, FREET4, T3FREE, THYROIDAB in the last 72 hours.  Anemia Panel: Recent Labs    11/11/17 0024  VITAMINB12 259  FERRITIN 3*  TIBC 587*  IRON 9*  RETICCTPCT 1.7    Urine analysis:    Component Value Date/Time   COLORURINE YELLOW 11/11/2017 0235   APPEARANCEUR CLEAR 11/11/2017 0235   LABSPEC 1.038 (H) 11/11/2017 0235   PHURINE 8.0 11/11/2017 0235   GLUCOSEU NEGATIVE 11/11/2017 0235    HGBUR LARGE (A) 11/11/2017 0235   BILIRUBINUR NEGATIVE 11/11/2017 0235   KETONESUR NEGATIVE 11/11/2017 0235   PROTEINUR NEGATIVE 11/11/2017 0235   UROBILINOGEN 0.2 07/28/2012 1838   NITRITE NEGATIVE 11/11/2017 0235   LEUKOCYTESUR NEGATIVE 11/11/2017 0235    Sepsis Labs: Lactic Acid, Venous No results found for: LATICACIDVEN  MICROBIOLOGY: No results found for this or any previous visit (from the past 240 hour(s)).  RADIOLOGY STUDIES/RESULTS: Ct Angio Head W Or Wo Contrast  Result Date: 11/11/2017 CLINICAL DATA:  42 y/o F; change in neuro exam. Stroke for follow-up. EXAM: CT ANGIOGRAPHY HEAD AND NECK CT PERFUSION BRAIN TECHNIQUE: Multidetector CT imaging of the head and neck was performed using the standard protocol during bolus administration of intravenous contrast. Multiplanar CT image reconstructions and MIPs were obtained to evaluate the vascular anatomy. Carotid  stenosis measurements (when applicable) are obtained utilizing NASCET criteria, using the distal internal carotid diameter as the denominator. Multiphase CT imaging of the brain was performed following IV bolus contrast injection. Subsequent parametric perfusion maps were calculated using RAPID software. CONTRAST:  65mL ISOVUE-370 IOPAMIDOL (ISOVUE-370) INJECTION 76% 90 cc Isovue 370 COMPARISON:  None. 11/10/2017 CT angiogram of the head and neck. FINDINGS: CT HEAD FINDINGS Brain: No evidence of acute infarction, hemorrhage, hydrocephalus, extra-axial collection or mass lesion/mass effect. Vascular: No hyperdense vessel or unexpected calcification. Skull: Normal. Negative for fracture or focal lesion. Sinuses/Orbits: No acute finding. Other: None. ASPECTS ([place] Stroke Program Early CT Score) - Ganglionic level infarction (caudate, lentiform nuclei, internal capsule, insula, M1-M3 cortex): 7 - Supraganglionic infarction (M4-M6 cortex): 3 Total score (0-10 with 10 being normal): 10 Review of the MIP images confirms the above findings  CTA NECK FINDINGS Aortic arch: Bovine variant branching. Imaged portion shows no evidence of aneurysm or dissection. No significant stenosis of the major arch vessel origins. Right carotid system: No evidence of dissection, stenosis (50% or greater) or occlusion. Focal posterior wall thickening of the left proximal ICA is less pronounced on the current study than on the prior CT angiogram of the neck (series 9, image 73). Left carotid system: No evidence of dissection, stenosis (50% or greater) or occlusion. Vertebral arteries: Codominant. No evidence of dissection, stenosis (50% or greater) or occlusion. Skeleton: No acute osseous abnormality. Other neck: Negative. Upper chest: Negative. Review of the MIP images confirms the above findings CTA HEAD FINDINGS Anterior circulation: No significant stenosis, proximal occlusion, aneurysm, or vascular malformation. Posterior circulation: No significant stenosis, proximal occlusion, aneurysm, or vascular malformation. Venous sinuses: As permitted by contrast timing, patent. Anatomic variants: None significant. Dominant right A1 and mildly larger right ICA likely due to increased flow when compared with the smaller left A1 and ICA. Review of the MIP images confirms the above findings CT Brain Perfusion Findings: CBF (<30%) Volume: 49mL Perfusion (Tmax>6.0s) volume: 26mL Mismatch Volume: 65mL Infarction Location:Not identified. There are a few foci of T-max greater than 4 seconds in the right MCA distribution. IMPRESSION: 1. Patent carotid and vertebral arteries. No dissection, aneurysm, or hemodynamically significant stenosis utilizing NASCET criteria. 2. Focal posterior wall thickening of the right proximal ICA is less pronounced on the current study than on the prior study. Findings may represent a soft plaque rupture. Additionally, given neck being held in flexion on the prior study, this finding may have represented a vessel kink. 3. Stable patent anterior and posterior  intracranial circulation. No large vessel occlusion, aneurysm, or significant stenosis. 4. Stable negative noncontrast CT of the head. 5. No acute stroke or ischemic penumbra by CT perfusion criteria. There are a few foci of T-max greater than 4 seconds in the right MCA distribution which may reflect small vessel ischemic changes. These results were called by telephone at the time of interpretation on 11/11/2017 at 3:30 am to Dr. Roland Rack , who verbally acknowledged these results. Electronically Signed   By: Kristine Garbe M.D.   On: 11/11/2017 03:29   Ct Angio Head W Or Wo Contrast  Result Date: 11/10/2017 CLINICAL DATA:  Acute onset lower extremity weakness, fell to RIGHT. Vision changes. EXAM: CT ANGIOGRAPHY HEAD AND NECK TECHNIQUE: Multidetector CT imaging of the head and neck was performed using the standard protocol during bolus administration of intravenous contrast. Multiplanar CT image reconstructions and MIPs were obtained to evaluate the vascular anatomy. Carotid stenosis measurements (when applicable) are obtained utilizing  NASCET criteria, using the distal internal carotid diameter as the denominator. COMPARISON:  CT HEAD November 10, 2017 at 9:33 p.m. FINDINGS: CTA NECK FINDINGS: AORTIC ARCH: Normal appearance of the thoracic arch, 2 vessel arch is a normal variant. The origins of the innominate, left Common carotid artery and subclavian artery are widely patent. RIGHT CAROTID SYSTEM: Common carotid artery is patent. Shelf-like filling defect RIGHT carotid bulb without hemodynamically significant stenosis by NASCET criteria. LEFT CAROTID SYSTEM: Common carotid artery is patent. Normal appearance of the carotid bifurcation without hemodynamically significant stenosis by NASCET criteria. Normal appearance of the internal carotid artery. VERTEBRAL ARTERIES:Codominant vertebral arteries. Normal appearance of the vertebral arteries, widely patent. SKELETON: No acute osseous process  though bone windows have not been submitted. OTHER NECK: Soft tissues of the neck are nonacute though, not tailored for evaluation. UPPER CHEST: Included lung apices are clear. No superior mediastinal lymphadenopathy. CTA HEAD FINDINGS: ANTERIOR CIRCULATION: Patent cervical internal carotid arteries, petrous, cavernous and supra clinoid internal carotid arteries. Patent anterior communicating artery. Patent anterior and middle cerebral arteries, mild luminal irregularity. Developmentally smaller LEFT MCA. No large vessel occlusion, significant stenosis, contrast extravasation or aneurysm. POSTERIOR CIRCULATION: Patent vertebral arteries, vertebrobasilar junction and basilar artery, as well as main branch vessels. Patent posterior cerebral arteries mild luminal irregularity. No large vessel occlusion, significant stenosis, contrast extravasation or aneurysm. VENOUS SINUSES: Major dural venous sinuses are patent though not tailored for evaluation on this angiographic examination. ANATOMIC VARIANTS: None. DELAYED PHASE: Not performed. MIP images reviewed. IMPRESSION: CTA NECK: 1. RIGHT carotid bulb intramural hematoma, less likely atypical fibromuscular dysplasia. No flow-limiting stenosis. 2. Patent vertebral arteries. CTA HEAD: 1.  No emergent large vessel occlusion or flow-limiting stenosis. 2. Mild intracranial atherosclerosis, less likely vasculopathy. Critical Value/emergent results were called by telephone at the time of interpretation on 11/10/2017 at 9:45 pm to Dr. Roland Rack , who verbally acknowledged these results. Electronically Signed   By: Elon Alas M.D.   On: 11/10/2017 21:51   Ct Angio Neck W Or Wo Contrast  Result Date: 11/11/2017 CLINICAL DATA:  42 y/o F; change in neuro exam. Stroke for follow-up. EXAM: CT ANGIOGRAPHY HEAD AND NECK CT PERFUSION BRAIN TECHNIQUE: Multidetector CT imaging of the head and neck was performed using the standard protocol during bolus administration of  intravenous contrast. Multiplanar CT image reconstructions and MIPs were obtained to evaluate the vascular anatomy. Carotid stenosis measurements (when applicable) are obtained utilizing NASCET criteria, using the distal internal carotid diameter as the denominator. Multiphase CT imaging of the brain was performed following IV bolus contrast injection. Subsequent parametric perfusion maps were calculated using RAPID software. CONTRAST:  31mL ISOVUE-370 IOPAMIDOL (ISOVUE-370) INJECTION 76% 90 cc Isovue 370 COMPARISON:  None. 11/10/2017 CT angiogram of the head and neck. FINDINGS: CT HEAD FINDINGS Brain: No evidence of acute infarction, hemorrhage, hydrocephalus, extra-axial collection or mass lesion/mass effect. Vascular: No hyperdense vessel or unexpected calcification. Skull: Normal. Negative for fracture or focal lesion. Sinuses/Orbits: No acute finding. Other: None. ASPECTS ([place] Stroke Program Early CT Score) - Ganglionic level infarction (caudate, lentiform nuclei, internal capsule, insula, M1-M3 cortex): 7 - Supraganglionic infarction (M4-M6 cortex): 3 Total score (0-10 with 10 being normal): 10 Review of the MIP images confirms the above findings CTA NECK FINDINGS Aortic arch: Bovine variant branching. Imaged portion shows no evidence of aneurysm or dissection. No significant stenosis of the major arch vessel origins. Right carotid system: No evidence of dissection, stenosis (50% or greater) or occlusion. Focal posterior wall thickening of  the left proximal ICA is less pronounced on the current study than on the prior CT angiogram of the neck (series 9, image 73). Left carotid system: No evidence of dissection, stenosis (50% or greater) or occlusion. Vertebral arteries: Codominant. No evidence of dissection, stenosis (50% or greater) or occlusion. Skeleton: No acute osseous abnormality. Other neck: Negative. Upper chest: Negative. Review of the MIP images confirms the above findings CTA HEAD FINDINGS  Anterior circulation: No significant stenosis, proximal occlusion, aneurysm, or vascular malformation. Posterior circulation: No significant stenosis, proximal occlusion, aneurysm, or vascular malformation. Venous sinuses: As permitted by contrast timing, patent. Anatomic variants: None significant. Dominant right A1 and mildly larger right ICA likely due to increased flow when compared with the smaller left A1 and ICA. Review of the MIP images confirms the above findings CT Brain Perfusion Findings: CBF (<30%) Volume: 48mL Perfusion (Tmax>6.0s) volume: 40mL Mismatch Volume: 25mL Infarction Location:Not identified. There are a few foci of T-max greater than 4 seconds in the right MCA distribution. IMPRESSION: 1. Patent carotid and vertebral arteries. No dissection, aneurysm, or hemodynamically significant stenosis utilizing NASCET criteria. 2. Focal posterior wall thickening of the right proximal ICA is less pronounced on the current study than on the prior study. Findings may represent a soft plaque rupture. Additionally, given neck being held in flexion on the prior study, this finding may have represented a vessel kink. 3. Stable patent anterior and posterior intracranial circulation. No large vessel occlusion, aneurysm, or significant stenosis. 4. Stable negative noncontrast CT of the head. 5. No acute stroke or ischemic penumbra by CT perfusion criteria. There are a few foci of T-max greater than 4 seconds in the right MCA distribution which may reflect small vessel ischemic changes. These results were called by telephone at the time of interpretation on 11/11/2017 at 3:30 am to Dr. Roland Rack , who verbally acknowledged these results. Electronically Signed   By: Kristine Garbe M.D.   On: 11/11/2017 03:29   Ct Angio Neck W Or Wo Contrast  Result Date: 11/10/2017 CLINICAL DATA:  Acute onset lower extremity weakness, fell to RIGHT. Vision changes. EXAM: CT ANGIOGRAPHY HEAD AND NECK TECHNIQUE:  Multidetector CT imaging of the head and neck was performed using the standard protocol during bolus administration of intravenous contrast. Multiplanar CT image reconstructions and MIPs were obtained to evaluate the vascular anatomy. Carotid stenosis measurements (when applicable) are obtained utilizing NASCET criteria, using the distal internal carotid diameter as the denominator. COMPARISON:  CT HEAD November 10, 2017 at 9:33 p.m. FINDINGS: CTA NECK FINDINGS: AORTIC ARCH: Normal appearance of the thoracic arch, 2 vessel arch is a normal variant. The origins of the innominate, left Common carotid artery and subclavian artery are widely patent. RIGHT CAROTID SYSTEM: Common carotid artery is patent. Shelf-like filling defect RIGHT carotid bulb without hemodynamically significant stenosis by NASCET criteria. LEFT CAROTID SYSTEM: Common carotid artery is patent. Normal appearance of the carotid bifurcation without hemodynamically significant stenosis by NASCET criteria. Normal appearance of the internal carotid artery. VERTEBRAL ARTERIES:Codominant vertebral arteries. Normal appearance of the vertebral arteries, widely patent. SKELETON: No acute osseous process though bone windows have not been submitted. OTHER NECK: Soft tissues of the neck are nonacute though, not tailored for evaluation. UPPER CHEST: Included lung apices are clear. No superior mediastinal lymphadenopathy. CTA HEAD FINDINGS: ANTERIOR CIRCULATION: Patent cervical internal carotid arteries, petrous, cavernous and supra clinoid internal carotid arteries. Patent anterior communicating artery. Patent anterior and middle cerebral arteries, mild luminal irregularity. Developmentally smaller LEFT MCA. No  large vessel occlusion, significant stenosis, contrast extravasation or aneurysm. POSTERIOR CIRCULATION: Patent vertebral arteries, vertebrobasilar junction and basilar artery, as well as main branch vessels. Patent posterior cerebral arteries mild luminal  irregularity. No large vessel occlusion, significant stenosis, contrast extravasation or aneurysm. VENOUS SINUSES: Major dural venous sinuses are patent though not tailored for evaluation on this angiographic examination. ANATOMIC VARIANTS: None. DELAYED PHASE: Not performed. MIP images reviewed. IMPRESSION: CTA NECK: 1. RIGHT carotid bulb intramural hematoma, less likely atypical fibromuscular dysplasia. No flow-limiting stenosis. 2. Patent vertebral arteries. CTA HEAD: 1.  No emergent large vessel occlusion or flow-limiting stenosis. 2. Mild intracranial atherosclerosis, less likely vasculopathy. Critical Value/emergent results were called by telephone at the time of interpretation on 11/10/2017 at 9:45 pm to Dr. Roland Rack , who verbally acknowledged these results. Electronically Signed   By: Elon Alas M.D.   On: 11/10/2017 21:51   Mr Brain Wo Contrast  Result Date: 11/11/2017 CLINICAL DATA:  42 y/o F; change in neuro exam, evaluation for stroke. Follow-up of leg numbness and headache. EXAM: MRI HEAD WITHOUT CONTRAST TECHNIQUE: Multiplanar, multiecho pulse sequences of the brain and surrounding structures were obtained without intravenous contrast. COMPARISON:  11/11/2017 CT head, CTA head, CT perfusion head. FINDINGS: Brain: There several scattered subcentimeter foci of reduced diffusion throughout the right frontal and parietal cortices, right temporoparietal junction, right frontal periventricular white matter, and the right caudate nucleus compatible with acute/early subacute infarction. No associated hemorrhage or mass effect. No extra-axial collection, hydrocephalus, focal mass effect of the brain, effacement of basilar cisterns, or herniation. Vascular: Normal flow voids. Skull and upper cervical spine: Normal marrow signal. Sinuses/Orbits: Negative. Other: None. 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 No  associated hemorrhage or mass effect. These results were called by telephone at the time of interpretation on 11/11/2017 at 5:41 am to Dr. Leonel Ramsay, who verbally acknowledged these results. Electronically Signed   By: Kristine Garbe M.D.   On: 11/11/2017 05:45   Ct Cerebral Perfusion W Contrast  Result Date: 11/11/2017 CLINICAL DATA:  42 y/o F; change in neuro exam. Stroke for follow-up. EXAM: CT ANGIOGRAPHY HEAD AND NECK CT PERFUSION BRAIN TECHNIQUE: Multidetector CT imaging of the head and neck was performed using the standard protocol during bolus administration of intravenous contrast. Multiplanar CT image reconstructions and MIPs were obtained to evaluate the vascular anatomy. Carotid stenosis measurements (when applicable) are obtained utilizing NASCET criteria, using the distal internal carotid diameter as the denominator. Multiphase CT imaging of the brain was performed following IV bolus contrast injection. Subsequent parametric perfusion maps were calculated using RAPID software. CONTRAST:  41mL ISOVUE-370 IOPAMIDOL (ISOVUE-370) INJECTION 76% 90 cc Isovue 370 COMPARISON:  None. 11/10/2017 CT angiogram of the head and neck. FINDINGS: CT HEAD FINDINGS Brain: No evidence of acute infarction, hemorrhage, hydrocephalus, extra-axial collection or mass lesion/mass effect. Vascular: No hyperdense vessel or unexpected calcification. Skull: Normal. Negative for fracture or focal lesion. Sinuses/Orbits: No acute finding. Other: None. ASPECTS ([place] Stroke Program Early CT Score) - Ganglionic level infarction (caudate, lentiform nuclei, internal capsule, insula, M1-M3 cortex): 7 - Supraganglionic infarction (M4-M6 cortex): 3 Total score (0-10 with 10 being normal): 10 Review of the MIP images confirms the above findings CTA NECK FINDINGS Aortic arch: Bovine variant branching. Imaged portion shows no evidence of aneurysm or dissection. No significant stenosis of the major arch vessel origins. Right  carotid system: No evidence of dissection, stenosis (50% or greater) or occlusion. Focal posterior wall thickening  of the left proximal ICA is less pronounced on the current study than on the prior CT angiogram of the neck (series 9, image 73). Left carotid system: No evidence of dissection, stenosis (50% or greater) or occlusion. Vertebral arteries: Codominant. No evidence of dissection, stenosis (50% or greater) or occlusion. Skeleton: No acute osseous abnormality. Other neck: Negative. Upper chest: Negative. Review of the MIP images confirms the above findings CTA HEAD FINDINGS Anterior circulation: No significant stenosis, proximal occlusion, aneurysm, or vascular malformation. Posterior circulation: No significant stenosis, proximal occlusion, aneurysm, or vascular malformation. Venous sinuses: As permitted by contrast timing, patent. Anatomic variants: None significant. Dominant right A1 and mildly larger right ICA likely due to increased flow when compared with the smaller left A1 and ICA. Review of the MIP images confirms the above findings CT Brain Perfusion Findings: CBF (<30%) Volume: 16mL Perfusion (Tmax>6.0s) volume: 45mL Mismatch Volume: 77mL Infarction Location:Not identified. There are a few foci of T-max greater than 4 seconds in the right MCA distribution. IMPRESSION: 1. Patent carotid and vertebral arteries. No dissection, aneurysm, or hemodynamically significant stenosis utilizing NASCET criteria. 2. Focal posterior wall thickening of the right proximal ICA is less pronounced on the current study than on the prior study. Findings may represent a soft plaque rupture. Additionally, given neck being held in flexion on the prior study, this finding may have represented a vessel kink. 3. Stable patent anterior and posterior intracranial circulation. No large vessel occlusion, aneurysm, or significant stenosis. 4. Stable negative noncontrast CT of the head. 5. No acute stroke or ischemic penumbra by CT  perfusion criteria. There are a few foci of T-max greater than 4 seconds in the right MCA distribution which may reflect small vessel ischemic changes. These results were called by telephone at the time of interpretation on 11/11/2017 at 3:30 am to Dr. Roland Rack , who verbally acknowledged these results. Electronically Signed   By: Kristine Garbe M.D.   On: 11/11/2017 03:29   Ct Head Code Stroke Wo Contrast  Result Date: 11/10/2017 CLINICAL DATA:  Code stroke. Acute onset lower extremity weakness, felt to RIGHT. RIGHT vision changes. History of TIA. EXAM: CT HEAD WITHOUT CONTRAST TECHNIQUE: Contiguous axial images were obtained from the base of the skull through the vertex without intravenous contrast. COMPARISON:  None. FINDINGS: BRAIN: No intraparenchymal hemorrhage, mass effect nor midline shift. The ventricles and sulci are normal. No acute large vascular territory infarcts. No abnormal extra-axial fluid collections. 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 Basal cisterns are patent. VASCULAR: Unremarkable. SKULL/SOFT TISSUES: No skull fracture. No significant soft tissue swelling. ORBITS/SINUSES: The included ocular globes and orbital contents are normal.Trace paranasal sinus mucosal thickening. Mastoid air cells are well aerated. OTHER: None. ASPECTS [facility] Stroke Program Early CT Score) - Ganglionic level infarction (caudate, lentiform nuclei, internal capsule, insula, M1-M3 cortex): 7 - Supraganglionic infarction (M4-M6 cortex): 3 Total score (0-10 with 10 being normal): 10 IMPRESSION: 1. Negative non-contrast CT HEAD. 2. ASPECTS is 10. 3. Critical Value/emergent results text paged to [place], Neurology via AMION secure system on 11/10/2017 at 9:37 pm, including interpreting physician's phone number. Electronically Signed   By: Elon Alas M.D.   On: 11/10/2017 21:38     LOS: 0 days   Oren Binet,  MD  [facility]  If 7PM-7AM, please contact night-coverage  Please page via [email or web address]-click on MD name and type text message  11/11/2017, 10:56 AM

## 2017-11-11 NOTE — Progress Notes (Signed)
Pt called out for assist, stated she had seen "stars", states when she called for assist to use the bedpan to void her voice sounded different to her. Upon RN coming into room pt sounded same as she had been all day, denies seeing any stars at this time, denies nausea, assisted onto the bedpan and she voided.   Pt on her menstrual cycle, she states her flow is usually heaving, urine is bloody.

## 2017-11-11 NOTE — Progress Notes (Signed)
Pharmacy consulted to start feraheme on patient. Ferritin is 2 with Tsat of 3%. Will initiate feraheme 510mg  IV qweek x 2 doses to complete a course.   Pharmacy will sign off.   Alainah Phang A. Levada Dy, PharmD, [place] Pager: [phone] Please utilize Amion for appropriate phone number to reach the unit pharmacist ([place])

## 2017-11-11 NOTE — ED Notes (Signed)
Start heparin per Dr. Leonel Ramsay.

## 2017-11-11 NOTE — ED Notes (Signed)
Patient transported to MRI 

## 2017-11-11 NOTE — ED Notes (Signed)
Returned from MRI, patient states she is feeling a little better, able to move her toes on her left foot and able to move her left hand. Able to grip with her left hand however weaker than right.

## 2017-11-12 ENCOUNTER — Other Ambulatory Visit: Payer: Self-pay

## 2017-11-12 ENCOUNTER — Inpatient Hospital Stay (HOSPITAL_COMMUNITY): Payer: Medicaid Other

## 2017-11-12 ENCOUNTER — Encounter (HOSPITAL_COMMUNITY): Payer: Self-pay | Admitting: General Practice

## 2017-11-12 DIAGNOSIS — I639 Cerebral infarction, unspecified: Secondary | ICD-10-CM

## 2017-11-12 LAB — HEMOGLOBIN A1C
HEMOGLOBIN A1C: 5.6 % (ref 4.8–5.6)
Mean Plasma Glucose: 114.02 mg/dL

## 2017-11-12 LAB — TYPE AND SCREEN
ABO/RH(D): B POS
Antibody Screen: NEGATIVE
UNIT DIVISION: 0
UNIT DIVISION: 0

## 2017-11-12 LAB — HEPARIN LEVEL (UNFRACTIONATED)
HEPARIN UNFRACTIONATED: 0.55 [IU]/mL (ref 0.30–0.70)
HEPARIN UNFRACTIONATED: 0.53 [IU]/mL (ref 0.30–0.70)
HEPARIN UNFRACTIONATED: 0.31 [IU]/mL (ref 0.30–0.70)
Heparin Unfractionated: 0.46 IU/mL (ref 0.30–0.70)

## 2017-11-12 LAB — BPAM RBC
Blood Product Expiration Date: 201910012359
Blood Product Expiration Date: 201910012359
ISSUE DATE / TIME: 201909230011
ISSUE DATE / TIME: 201909230543
UNIT TYPE AND RH: 7300
Unit Type and Rh: 7300

## 2017-11-12 LAB — CBC
HEMATOCRIT: 29.6 % — AB (ref 36.0–46.0)
HEMOGLOBIN: 8.6 g/dL — AB (ref 12.0–15.0)
MCH: 19.2 pg — ABNORMAL LOW (ref 26.0–34.0)
MCHC: 29.1 g/dL — ABNORMAL LOW (ref 30.0–36.0)
MCV: 66.1 fL — ABNORMAL LOW (ref 78.0–100.0)
Platelets: 1081 10*3/uL (ref 150–400)
RBC: 4.48 MIL/uL (ref 3.87–5.11)
RDW: 32.1 % — ABNORMAL HIGH (ref 11.5–15.5)
WBC: 15.4 10*3/uL — AB (ref 4.0–10.5)

## 2017-11-12 LAB — C-REACTIVE PROTEIN: CRP: <0.8 mg/dL (ref <1.0)

## 2017-11-12 LAB — LIPID PANEL
Cholesterol: 52 mg/dL (ref 0–200)
HDL: 28 mg/dL — AB (ref >40)
LDL CALC: 5 mg/dL (ref 0–99)
Total CHOL/HDL Ratio: 1.9 RATIO
Triglycerides: 96 mg/dL (ref <150)
VLDL: 19 mg/dL (ref 0–40)

## 2017-11-12 LAB — SEDIMENTATION RATE: Sed Rate: 8 mm/hr (ref 0–22)

## 2017-11-12 LAB — HIV ANTIBODY (ROUTINE TESTING W REFLEX): HIV Screen 4th Generation wRfx: NONREACTIVE

## 2017-11-12 LAB — PATHOLOGIST SMEAR REVIEW

## 2017-11-12 MED ORDER — MEGESTROL ACETATE 40 MG PO TABS
40.0000 mg | ORAL_TABLET | Freq: Two times a day (BID) | ORAL | Status: DC
  Administered 2017-11-12 (×2): 40 mg via ORAL
  Filled 2017-11-12 (×3): qty 1

## 2017-11-12 MED ORDER — SODIUM CHLORIDE 0.9 % IV SOLN
INTRAVENOUS | Status: DC
  Administered 2017-11-12: 23:00:00 via INTRAVENOUS

## 2017-11-12 MED ORDER — WARFARIN - PHARMACIST DOSING INPATIENT
Freq: Every day | Status: DC
  Administered 2017-11-13: 17:00:00

## 2017-11-12 MED ORDER — COUMADIN BOOK
Freq: Once | Status: AC
  Administered 2017-11-12
  Filled 2017-11-12 (×2): qty 1

## 2017-11-12 MED ORDER — WARFARIN SODIUM 7.5 MG PO TABS
7.5000 mg | ORAL_TABLET | Freq: Once | ORAL | Status: AC
  Administered 2017-11-12: 7.5 mg via ORAL
  Filled 2017-11-12: qty 1

## 2017-11-12 NOTE — Evaluation (Signed)
Speech Language Pathology Evaluation Patient Details Name: Mallory Robinson MRN: [id number] DOB: [birth year] Today's Date: 11/12/2017 Time: 1131-1203 SLP Time Calculation (min) (ACUTE ONLY): 32 min  Problem List:  Patient Active Problem List   Diagnosis Date Noted  . Cerebral embolism with cerebral infarction 11/11/2017  . Acute CVA (cerebrovascular accident) ([place]) 11/11/2017  . Neurological deficit present 11/10/2017  . Bipolar I disorder, most recent episode (or current) depressed, severe, without mention of psychotic behavior 03/05/2013  . PTSD (post-traumatic stress disorder) 03/05/2013  . Alcohol dependence ([place]) 03/04/2013   Past Medical History:  Past Medical History:  Diagnosis Date  . Bipolar 1 disorder ([place])   . Complication of anesthesia   . Depression   . PONV (postoperative nausea and vomiting)    Past Surgical History:  Past Surgical History:  Procedure Laterality Date  . CESAREAN SECTION     X2  . HERNIA REPAIR     HPI:  Pt is a 42 y.o. admitted 11/10/17 with L-side weakness, R eye vision loss, and fall. 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 PMH includes bipolar disorder, anemia, depression.    Assessment / Plan / Recommendation Clinical Impression  Pt scored a 25/30 on the MOCA, suggestive of mild cognitive impairment, which was most evident with higher level cognitive functions. Pt needed increased time for complex processing and problem solving. Mild errors were noted in recall of new information. Mod faded to Min cues were provided to attend to her left environment. Pt has good emergent and even some anticipatory awareness, but does become easily emotional with cognitive difficulties. Recommend additional SLP f/u to maximie higher level cognitive skills in pt who was independent PTA.    SLP Assessment  SLP Recommendation/Assessment:  Patient needs continued Speech Lanaguage Pathology Services SLP Visit Diagnosis: Cognitive communication deficit (R41.841)    Follow Up Recommendations  Home health SLP    Frequency and Duration min 2x/week  2 weeks      SLP Evaluation Cognition  Overall Cognitive Status: Impaired/Different from baseline Arousal/Alertness: Awake/alert Orientation Level: Oriented X4 Attention: Selective Selective Attention: Appears intact Memory: Impaired Memory Impairment: Decreased recall of new information Awareness: Appears intact Problem Solving: Impaired Problem Solving Impairment: Verbal complex;Functional complex Executive Function: Self Correcting Self Correcting: Impaired Self Correcting Impairment: Functional complex;Verbal complex Safety/Judgment: Appears intact       Comprehension  Auditory Comprehension Overall Auditory Comprehension: Appears within functional limits for tasks assessed    Expression Expression Primary Mode of Expression: Verbal Verbal Expression Overall Verbal Expression: Appears within functional limits for tasks assessed Written Expression Dominant Hand: Right   Oral / Motor  Motor Speech Overall Motor Speech: Appears within functional limits for tasks assessed   GO                    Germain Osgood 11/12/2017, 1:16 PM  Germain Osgood, M.A. [place] Acute Environmental education officer [phone] Office [phone]

## 2017-11-12 NOTE — Evaluation (Signed)
Occupational Therapy Evaluation Patient Details Name: Mallory Robinson MRN: [id number] DOB: [birth year] Today's Date: 11/12/2017    History of Present Illness Pt is a 42 y.o. admitted 11/10/17 with L-side weakness, L eye blurry vision and L neglect and fall. 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 PMH includes bipolar disorder, anemia, depression.   Clinical Impression   PATIENT HAS L SIDED WEAKNESS, DECREASED SENSATION IN L LE, L NEGLECT WHICH IS LIMITING ABILITY WITH ADLS AND MOBILITY. PATIENT L UE IS 4/5 FOR STRENGTH AND COORDINATION AND SENSATION IS GROSSLY INTACT. PATIENT HAS L LE WEANESS AND DECREASED SENSATION. PATIENT HAS LEFT NEGLECT AND WAS INSTRUCTED TO SCAN THE ENVIRONMENT TO DECREASE RISK OF FALLS. PATIENT PLANS TO D/C HOME TO HER MOTHERS HOUSE WHO CAN PROVIDE 24 HOUR CARE INIALLY.     Follow Up Recommendations       Equipment Recommendations  Tub/shower bench    Recommendations for Other Services       Precautions / Restrictions Precautions Precautions: Fall Precaution Comments: Decreased LLE sensation; L side inattention Restrictions Weight Bearing Restrictions: No      Mobility Bed Mobility Overal bed mobility: Needs Assistance Bed Mobility: Supine to Sit     Supine to sit: Supervision;HOB elevated     General bed mobility comments: No physical assist required, sat on R-side of bed, noted pt not using LUE to assist  Transfers Overall transfer level: Needs assistance Equipment used: None;Rolling walker (2 wheeled) Transfers: Sit to/from Stand Sit to Stand: Min assist         General transfer comment: MIN A WITH TRANSFERS AND AMB WITH WALKER.     Balance Overall balance assessment: Needs assistance   Sitting balance-Leahy Scale: Good Sitting balance - Comments: Indep to don socks sitting EOB     Standing balance-Leahy  Scale: Fair Standing balance comment: Can perform ADL tasks at sink without UE support                           ADL either performed or assessed with clinical judgement   ADL Overall ADL's : Needs assistance/impaired Eating/Feeding: Modified independent   Grooming: Wash/dry hands;Wash/dry face;Supervision/safety;Standing   Upper Body Bathing: Supervision/ safety;Set up;Sitting   Lower Body Bathing: Minimal assistance;Sit to/from stand   Upper Body Dressing : Minimal assistance;Sitting   Lower Body Dressing: Minimal assistance;Sit to/from stand   Toilet Transfer: Minimal assistance;Cueing for safety;Ambulation   Toileting- Clothing Manipulation and Hygiene: Minimal assistance;Cueing for safety;Sit to/from stand       Functional mobility during ADLs: Minimal assistance(PATIENT REQUIRES CUES TO ATTEND TO L SIDE DURING AMB) General ADL Comments: PATIENT REQUIRES CUES TO ATTEMPT TO LEFT SIDE OF ENVIROMENT. PATIENT HAS LEFT NEGLECT AND WAS ADVISED TO SCAN ENVIONMENT TO LOCATE OBJECTS FOR ADLS AND MOBIITY.      Vision Baseline Vision/History: No visual deficits Patient Visual Report: Other (comment) Vision Assessment?: (PATIENT IS AWARE OF LEFT VISUAL DEFIICT) Additional Comments: PATIENT AND WIFE EDUCARED ON SCANNING THE ENVIROMENT.      Perception     Praxis      Pertinent Vitals/Pain Pain Assessment: No/denies pain Pain Score: 2  Faces Pain Scale: Hurts a little bit Pain Location: (HEADACHE) Pain Descriptors / Indicators: Headache Pain Intervention(s): Monitored during session     Hand Dominance Right   Extremity/Trunk Assessment Upper Extremity Assessment Upper Extremity Assessment: LUE deficits/detail LUE Deficits /  Details: 4/5 for strength, slight decrease in proprioception   Lower Extremity Assessment Lower Extremity Assessment: LLE deficits/detail LLE Deficits / Details: L hip flex 4/5, knee ext 3/5, knee flex 3/5, ankle DF 3-/5; decreased light  touch distally, pt reports "pins and needles" lower leg/foot LLE Sensation: decreased light touch;decreased proprioception LLE Coordination: decreased gross motor;decreased fine motor       Communication Communication Communication: No difficulties   Cognition Arousal/Alertness: Awake/alert Behavior During Therapy: WFL for tasks assessed/performed Overall Cognitive Status: Impaired/Different from baseline Area of Impairment: Problem solving;Safety/judgement;Following commands                       Following Commands: Follows multi-step commands consistently Safety/Judgement: Decreased awareness of deficits   Problem Solving: Requires verbal cues;Difficulty sequencing General Comments: Pt aware of L-side deficits; difficulty turning towards L-side and scanning to L, but able to correct with cues, requiring less cues by end of session   General Comments       Exercises     Shoulder Instructions      Home Living Family/patient expects to be discharged to:: Private residence Living Arrangements: Spouse/significant other;Children Available Help at Discharge: Family;Available 24 hours/day Type of Home: Apartment Home Access: Stairs to enter [employer] of Steps: 2 flights Entrance Stairs-Rails: Left Home Layout: One level     Bathroom Shower/Tub: Teacher, early years/pre: Standard Bathroom Accessibility: Yes How Accessible: Accessible via walker Home Equipment: None   Additional Comments: PATIENT PLANS TO D/C HOME WITH MOTHER INITALLY SECONDARY SHE CAN PROVIDE 24 HOUR CARE.   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 Drives        OT Problem List: Decreased strength;Decreased activity tolerance;Impaired vision/perception;Decreased safety awareness      OT Treatment/Interventions: Self-care/ADL  training;Therapeutic exercise;Therapeutic activities;Patient/family education    OT Goals(Current goals can be found in the care plan section) Acute Rehab OT Goals Patient Stated Goal: GET BETTER AND GO HOME OT Goal Formulation: With patient Time For Goal Achievement: 11/26/17 Potential to Achieve Goals: Good  OT Frequency: Min 2X/week   Barriers to D/C:            Co-evaluation              AM-PAC PT "6 Clicks" Daily Activity     Outcome Measure Help from another person eating meals?: None Help from another person taking care of personal grooming?: A Little Help from another person toileting, which includes using toliet, bedpan, or urinal?: A Little Help from another person bathing (including washing, rinsing, drying)?: A Little Help from another person to put on and taking off regular upper body clothing?: A Little Help from another person to put on and taking off regular lower body clothing?: A Little 6 Click Score: 19   End of Session Equipment Utilized During Treatment: Gait belt;Rolling walker Nurse Communication: (OK THERAPY)  Activity Tolerance: Patient tolerated treatment well Patient left: in bed;with call bell/phone within reach;with bed alarm set;with family/visitor present  OT Visit Diagnosis: Unsteadiness on feet (R26.81)                Time: 9924-2683 OT Time Calculation (min): 38 min Charges:  OT General Charges $OT Visit: 1 Visit OT Evaluation $OT Eval Low Complexity: 1 Low OT Treatments $Self Care/Home Management : 8-22 mins $Therapeutic Activity: 4-19 mins 6 CLICKS  Carder Yin 11/12/2017, 1:32 PM

## 2017-11-12 NOTE — Progress Notes (Addendum)
PROGRESS NOTE        PATIENT DETAILS Name: Mallory Robinson Age: 41 y.o. Sex: female Date of Birth: 24-Jan-1976 Admit Date: 11/10/2017 Admitting Physician Vilma Prader, MD [facility]:[id number], No Pcp Per  Brief Narrative: Patient is a 42 y.o. female who claims to have prior history of hypertension (claims she was taken off antihypertensives a few years back), long-standing history of menorrhagia, fibroids presented with left-sided weakness, found to have acute CVA-however upon further evaluation found to have severe microcytic anemia with hemoglobin of 6.0.  See below for further details  Subjective: Has more pin and needle sensation in her left hand and feet today.  Continues to have slight left-sided weakness.     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  TEE tentatively scheduled for 9/25.  LDL 5, A1c 5.6.  Continue IV heparin-once all procedures are complete-we will start Coumadin.  Will await further recommendations from neurology/stroke team.    Severe microcytic anemia: Secondary to iron deficiency in the setting of chronic blood loss due to menorrhagia.  Claims to have a known history of fibroids.  Per patient, her menstrual period last close to 7 days, on day 3 it is typically heavy-patient usues to close to 5 pads, and apparently has to change her clothes twice.  Status post 1 dose of IV iron, and 2 units of PRBC-hemoglobin currently stable-continue to follow periodically.  Will require initiation of iron supplementation on discharge.  Fibroids: Apparently has a known history of fibroids-spoke with GYN on-call-Dr. Nehemiah Settle recommends starting Megace, await pelvic and transvaginal ultrasound.    Vitamin B12 deficiency: Found to have low vitamin B12 levels as well-being supplemented at this point.  Some concern for absorption issues in the gut as  patient has both vitamin B12 and iron deficiency.  Stable for further work-up to be pursued in the outpatient setting.    Thrombocytosis: Suspect this is reactive in the setting of iron deficiency.  Should improve with treatment of underlying iron deficiency anemia, if in the next few months-this does not improve-will require genetic testing for known mutations related to essential thrombocythemia   Bipolar disorder: Currently not on any mood stabilizing agents at this point.  DVT Prophylaxis: IV heparin  Code Status: Full code   Family Communication: None at bedside  Disposition Plan: Remain inpatient-home once work-up is complete-over the next few days.    Antimicrobial agents: Anti-infectives (From admission, onward)   None      Procedures: None  CONSULTS:  neurology  Time spent: 25 minutes-Greater than 50% of this time was spent in counseling, explanation of diagnosis, planning of further management, and coordination of care.  MEDICATIONS: Scheduled Meds: .  stroke: mapping our early stages of recovery book   Does not apply Once  . sodium chloride   Intravenous Once  . atorvastatin  80 mg Oral q1800  . cyanocobalamin  1,000 mcg Subcutaneous Daily  . folic acid  2 mg Oral Daily  . megestrol  40 mg Oral BID  . sodium chloride flush  3 mL Intravenous Q12H   Continuous Infusions: . ferumoxytol Stopped (11/11/17 2000)  . heparin 1,200 Units/hr (11/12/17 0400)   PRN Meds:.acetaminophen, iopamidol   PHYSICAL EXAM: Vital signs: Vitals:  11/11/17 2148 11/12/17 0305 11/12/17 0457 11/12/17 1221  BP: 128/88 (!) 142/98 (!) 143/96 (!) 141/111  Pulse: 68 69 70 69  Resp: 18 16 16 18   Temp: 98.9 F (37.2 C) 98.7 F (37.1 C) 99.3 F (37.4 C) 98.7 F (37.1 C)  TempSrc: Oral Oral Oral Oral  SpO2: (!) 83% 100% 100% 100%  Weight:      Height:       Filed Weights   11/10/17 2127 11/11/17 0829  Weight: 66.8 kg 74.8 kg   Body mass index is 29.23 kg/m.   General  appearance:Awake, alert, not in any distress.  Eyes:no scleral icterus. HEENT: Atraumatic and Normocephalic Neck: supple, no JVD. Resp:Good air entry bilaterally,no rales or rhonchi CVS: S1 S2 regular, no murmurs.  GI: Bowel sounds present, Non tender and not distended with no gaurding, rigidity or rebound. Extremities: B/L Lower Ext shows no edema, both legs are warm to touch Neurology: Proximately 4/5 on left upper and lower extremity Psychiatric: Normal judgment and insight. Normal mood. Musculoskeletal:No digital cyanosis Skin:No Rash, warm and dry Wounds:N/A  I have personally reviewed following labs and imaging studies  LABORATORY DATA: CBC: Recent Labs  Lab 11/10/17 2125 11/10/17 2129 11/11/17 0024 11/11/17 1111 11/12/17 0219  WBC  --  16.8* 16.4* 17.5* 15.4*  NEUTROABS  --  14.1*  --   --   --   HGB 8.2* 6.0* 5.6* 8.6* 8.6*  HCT 24.0* 23.2* 22.2* 30.3* 29.6*  MCV  --  61.9* 61.2* 66.7* 66.1*  PLT  --  PLATELET CLUMPS NOTED ON SMEAR, UNABLE TO ESTIMATE 1,301* 1,170* 1,081*    Basic Metabolic Panel: Recent Labs  Lab 11/10/17 2125 11/10/17 2129 11/11/17 1111  NA 139 136 139  K 4.0 3.9 3.6  CL 104 104 106  CO2  --  22 23  GLUCOSE 117* 120* 101*  BUN 7 7 <5*  CREATININE 0.90 0.96 0.76  CALCIUM  --  9.1 9.0    GFR: Estimated Creatinine Clearance: 87.9 mL/min (by C-G formula based on SCr of 0.76 mg/dL).  Liver Function Tests: Recent Labs  Lab 11/10/17 2129  AST 34  ALT 20  ALKPHOS 63  BILITOT 0.6  PROT 7.7  ALBUMIN 4.2   No results for input(s): LIPASE, AMYLASE in the last 168 hours. No results for input(s): AMMONIA in the last 168 hours.  Coagulation Profile: Recent Labs  Lab 11/10/17 2129  INR 1.16    Cardiac Enzymes: No results for input(s): CKTOTAL, CKMB, CKMBINDEX, TROPONINI in the last 168 hours.  BNP (last 3 results) No results for input(s): PROBNP in the last 8760 hours.  HbA1C: Recent Labs    11/12/17 0219  HGBA1C 5.6     CBG: Recent Labs  Lab 11/10/17 2118  GLUCAP 121*    Lipid Profile: Recent Labs    11/12/17 0219  CHOL 52  HDL 28*  LDLCALC 5  TRIG 96  CHOLHDL 1.9    Thyroid Function Tests: No results for input(s): TSH, T4TOTAL, FREET4, T3FREE, THYROIDAB in the last 72 hours.  Anemia Panel: Recent Labs    11/11/17 0024  VITAMINB12 259  FERRITIN 3*  TIBC 587*  IRON 9*  RETICCTPCT 1.7    Urine analysis:    Component Value Date/Time   COLORURINE YELLOW 11/11/2017 0235   APPEARANCEUR CLEAR 11/11/2017 0235   LABSPEC 1.038 (H) 11/11/2017 0235   PHURINE 8.0 11/11/2017 0235   GLUCOSEU NEGATIVE 11/11/2017 0235   HGBUR LARGE (A) 11/11/2017 0235   BILIRUBINUR  NEGATIVE 11/11/2017 0235   KETONESUR NEGATIVE 11/11/2017 0235   PROTEINUR NEGATIVE 11/11/2017 0235   UROBILINOGEN 0.2 07/28/2012 1838   NITRITE NEGATIVE 11/11/2017 0235   LEUKOCYTESUR NEGATIVE 11/11/2017 0235    Sepsis Labs: Lactic Acid, Venous No results found for: LATICACIDVEN  MICROBIOLOGY: No results found for this or any previous visit (from the past 240 hour(s)).  RADIOLOGY STUDIES/RESULTS: Ct Angio Head W Or Wo Contrast  Result Date: 11/11/2017 CLINICAL DATA:  42 y/o F; change in neuro exam. Stroke for follow-up. EXAM: CT ANGIOGRAPHY HEAD AND NECK CT PERFUSION BRAIN TECHNIQUE: Multidetector CT imaging of the head and neck was performed using the standard protocol during bolus administration of intravenous contrast. Multiplanar CT image reconstructions and MIPs were obtained to evaluate the vascular anatomy. Carotid stenosis measurements (when applicable) are obtained utilizing NASCET criteria, using the distal internal carotid diameter as the denominator. Multiphase CT imaging of the brain was performed following IV bolus contrast injection. Subsequent parametric perfusion maps were calculated using RAPID software. CONTRAST:  83mL ISOVUE-370 IOPAMIDOL (ISOVUE-370) INJECTION 76% 90 cc Isovue 370 COMPARISON:  None.  11/10/2017 CT angiogram of the head and neck. FINDINGS: CT HEAD FINDINGS Brain: No evidence of acute infarction, hemorrhage, hydrocephalus, extra-axial collection or mass lesion/mass effect. Vascular: No hyperdense vessel or unexpected calcification. Skull: Normal. Negative for fracture or focal lesion. Sinuses/Orbits: No acute finding. Other: None. ASPECTS ([place] Stroke Program Early CT Score) - Ganglionic level infarction (caudate, lentiform nuclei, internal capsule, insula, M1-M3 cortex): 7 - Supraganglionic infarction (M4-M6 cortex): 3 Total score (0-10 with 10 being normal): 10 Review of the MIP images confirms the above findings CTA NECK FINDINGS Aortic arch: Bovine variant branching. Imaged portion shows no evidence of aneurysm or dissection. No significant stenosis of the major arch vessel origins. Right carotid system: No evidence of dissection, stenosis (50% or greater) or occlusion. Focal posterior wall thickening of the left proximal ICA is less pronounced on the current study than on the prior CT angiogram of the neck (series 9, image 73). Left carotid system: No evidence of dissection, stenosis (50% or greater) or occlusion. Vertebral arteries: Codominant. No evidence of dissection, stenosis (50% or greater) or occlusion. Skeleton: No acute osseous abnormality. Other neck: Negative. Upper chest: Negative. Review of the MIP images confirms the above findings CTA HEAD FINDINGS Anterior circulation: No significant stenosis, proximal occlusion, aneurysm, or vascular malformation. Posterior circulation: No significant stenosis, proximal occlusion, aneurysm, or vascular malformation. Venous sinuses: As permitted by contrast timing, patent. Anatomic variants: None significant. Dominant right A1 and mildly larger right ICA likely due to increased flow when compared with the smaller left A1 and ICA. Review of the MIP images confirms the above findings CT Brain Perfusion Findings: CBF (<30%) Volume: 62mL  Perfusion (Tmax>6.0s) volume: 33mL Mismatch Volume: 39mL Infarction Location:Not identified. There are a few foci of T-max greater than 4 seconds in the right MCA distribution. IMPRESSION: 1. Patent carotid and vertebral arteries. No dissection, aneurysm, or hemodynamically significant stenosis utilizing NASCET criteria. 2. Focal posterior wall thickening of the right proximal ICA is less pronounced on the current study than on the prior study. Findings may represent a soft plaque rupture. Additionally, given neck being held in flexion on the prior study, this finding may have represented a vessel kink. 3. Stable patent anterior and posterior intracranial circulation. No large vessel occlusion, aneurysm, or significant stenosis. 4. Stable negative noncontrast CT of the head. 5. No acute stroke or ischemic penumbra by CT perfusion criteria. There are a few  foci of T-max greater than 4 seconds in the right MCA distribution which may reflect small vessel ischemic changes. These results were called by telephone at the time of interpretation on 11/11/2017 at 3:30 am to Dr. Roland Rack , who verbally acknowledged these results. Electronically Signed   By: Kristine Garbe M.D.   On: 11/11/2017 03:29   Ct Angio Head W Or Wo Contrast  Result Date: 11/10/2017 CLINICAL DATA:  Acute onset lower extremity weakness, fell to RIGHT. Vision changes. EXAM: CT ANGIOGRAPHY HEAD AND NECK TECHNIQUE: Multidetector CT imaging of the head and neck was performed using the standard protocol during bolus administration of intravenous contrast. Multiplanar CT image reconstructions and MIPs were obtained to evaluate the vascular anatomy. Carotid stenosis measurements (when applicable) are obtained utilizing NASCET criteria, using the distal internal carotid diameter as the denominator. COMPARISON:  CT HEAD November 10, 2017 at 9:33 p.m. FINDINGS: CTA NECK FINDINGS: AORTIC ARCH: Normal appearance of the thoracic arch, 2 vessel  arch is a normal variant. The origins of the innominate, left Common carotid artery and subclavian artery are widely patent. RIGHT CAROTID SYSTEM: Common carotid artery is patent. Shelf-like filling defect RIGHT carotid bulb without hemodynamically significant stenosis by NASCET criteria. LEFT CAROTID SYSTEM: Common carotid artery is patent. Normal appearance of the carotid bifurcation without hemodynamically significant stenosis by NASCET criteria. Normal appearance of the internal carotid artery. VERTEBRAL ARTERIES:Codominant vertebral arteries. Normal appearance of the vertebral arteries, widely patent. SKELETON: No acute osseous process though bone windows have not been submitted. OTHER NECK: Soft tissues of the neck are nonacute though, not tailored for evaluation. UPPER CHEST: Included lung apices are clear. No superior mediastinal lymphadenopathy. CTA HEAD FINDINGS: ANTERIOR CIRCULATION: Patent cervical internal carotid arteries, petrous, cavernous and supra clinoid internal carotid arteries. Patent anterior communicating artery. Patent anterior and middle cerebral arteries, mild luminal irregularity. Developmentally smaller LEFT MCA. No large vessel occlusion, significant stenosis, contrast extravasation or aneurysm. POSTERIOR CIRCULATION: Patent vertebral arteries, vertebrobasilar junction and basilar artery, as well as main branch vessels. Patent posterior cerebral arteries mild luminal irregularity. No large vessel occlusion, significant stenosis, contrast extravasation or aneurysm. VENOUS SINUSES: Major dural venous sinuses are patent though not tailored for evaluation on this angiographic examination. ANATOMIC VARIANTS: None. DELAYED PHASE: Not performed. MIP images reviewed. IMPRESSION: CTA NECK: 1. RIGHT carotid bulb intramural hematoma, less likely atypical fibromuscular dysplasia. No flow-limiting stenosis. 2. Patent vertebral arteries. CTA HEAD: 1.  No emergent large vessel occlusion or  flow-limiting stenosis. 2. Mild intracranial atherosclerosis, less likely vasculopathy. Critical Value/emergent results were called by telephone at the time of interpretation on 11/10/2017 at 9:45 pm to Dr. Roland Rack , who verbally acknowledged these results. Electronically Signed   By: Elon Alas M.D.   On: 11/10/2017 21:51   Ct Angio Neck W Or Wo Contrast  Result Date: 11/11/2017 CLINICAL DATA:  42 y/o F; change in neuro exam. Stroke for follow-up. EXAM: CT ANGIOGRAPHY HEAD AND NECK CT PERFUSION BRAIN TECHNIQUE: Multidetector CT imaging of the head and neck was performed using the standard protocol during bolus administration of intravenous contrast. Multiplanar CT image reconstructions and MIPs were obtained to evaluate the vascular anatomy. Carotid stenosis measurements (when applicable) are obtained utilizing NASCET criteria, using the distal internal carotid diameter as the denominator. Multiphase CT imaging of the brain was performed following IV bolus contrast injection. Subsequent parametric perfusion maps were calculated using RAPID software. CONTRAST:  51mL ISOVUE-370 IOPAMIDOL (ISOVUE-370) INJECTION 76% 90 cc Isovue 370 COMPARISON:  None. 11/10/2017  CT angiogram of the head and neck. FINDINGS: CT HEAD FINDINGS Brain: No evidence of acute infarction, hemorrhage, hydrocephalus, extra-axial collection or mass lesion/mass effect. Vascular: No hyperdense vessel or unexpected calcification. Skull: Normal. Negative for fracture or focal lesion. Sinuses/Orbits: No acute finding. Other: None. ASPECTS ([place] Stroke Program Early CT Score) - Ganglionic level infarction (caudate, lentiform nuclei, internal capsule, insula, M1-M3 cortex): 7 - Supraganglionic infarction (M4-M6 cortex): 3 Total score (0-10 with 10 being normal): 10 Review of the MIP images confirms the above findings CTA NECK FINDINGS Aortic arch: Bovine variant branching. Imaged portion shows no evidence of aneurysm or  dissection. No significant stenosis of the major arch vessel origins. Right carotid system: No evidence of dissection, stenosis (50% or greater) or occlusion. Focal posterior wall thickening of the left proximal ICA is less pronounced on the current study than on the prior CT angiogram of the neck (series 9, image 73). Left carotid system: No evidence of dissection, stenosis (50% or greater) or occlusion. Vertebral arteries: Codominant. No evidence of dissection, stenosis (50% or greater) or occlusion. Skeleton: No acute osseous abnormality. Other neck: Negative. Upper chest: Negative. Review of the MIP images confirms the above findings CTA HEAD FINDINGS Anterior circulation: No significant stenosis, proximal occlusion, aneurysm, or vascular malformation. Posterior circulation: No significant stenosis, proximal occlusion, aneurysm, or vascular malformation. Venous sinuses: As permitted by contrast timing, patent. Anatomic variants: None significant. Dominant right A1 and mildly larger right ICA likely due to increased flow when compared with the smaller left A1 and ICA. Review of the MIP images confirms the above findings CT Brain Perfusion Findings: CBF (<30%) Volume: 62mL Perfusion (Tmax>6.0s) volume: 99mL Mismatch Volume: 18mL Infarction Location:Not identified. There are a few foci of T-max greater than 4 seconds in the right MCA distribution. IMPRESSION: 1. Patent carotid and vertebral arteries. No dissection, aneurysm, or hemodynamically significant stenosis utilizing NASCET criteria. 2. Focal posterior wall thickening of the right proximal ICA is less pronounced on the current study than on the prior study. Findings may represent a soft plaque rupture. Additionally, given neck being held in flexion on the prior study, this finding may have represented a vessel kink. 3. Stable patent anterior and posterior intracranial circulation. No large vessel occlusion, aneurysm, or significant stenosis. 4. Stable negative  noncontrast CT of the head. 5. No acute stroke or ischemic penumbra by CT perfusion criteria. There are a few foci of T-max greater than 4 seconds in the right MCA distribution which may reflect small vessel ischemic changes. These results were called by telephone at the time of interpretation on 11/11/2017 at 3:30 am to Dr. Roland Rack , who verbally acknowledged these results. Electronically Signed   By: Kristine Garbe M.D.   On: 11/11/2017 03:29   Ct Angio Neck W Or Wo Contrast  Result Date: 11/10/2017 CLINICAL DATA:  Acute onset lower extremity weakness, fell to RIGHT. Vision changes. EXAM: CT ANGIOGRAPHY HEAD AND NECK TECHNIQUE: Multidetector CT imaging of the head and neck was performed using the standard protocol during bolus administration of intravenous contrast. Multiplanar CT image reconstructions and MIPs were obtained to evaluate the vascular anatomy. Carotid stenosis measurements (when applicable) are obtained utilizing NASCET criteria, using the distal internal carotid diameter as the denominator. COMPARISON:  CT HEAD November 10, 2017 at 9:33 p.m. FINDINGS: CTA NECK FINDINGS: AORTIC ARCH: Normal appearance of the thoracic arch, 2 vessel arch is a normal variant. The origins of the innominate, left Common carotid artery and subclavian artery are widely patent. RIGHT  CAROTID SYSTEM: Common carotid artery is patent. Shelf-like filling defect RIGHT carotid bulb without hemodynamically significant stenosis by NASCET criteria. LEFT CAROTID SYSTEM: Common carotid artery is patent. Normal appearance of the carotid bifurcation without hemodynamically significant stenosis by NASCET criteria. Normal appearance of the internal carotid artery. VERTEBRAL ARTERIES:Codominant vertebral arteries. Normal appearance of the vertebral arteries, widely patent. SKELETON: No acute osseous process though bone windows have not been submitted. OTHER NECK: Soft tissues of the neck are nonacute though, not  tailored for evaluation. UPPER CHEST: Included lung apices are clear. No superior mediastinal lymphadenopathy. CTA HEAD FINDINGS: ANTERIOR CIRCULATION: Patent cervical internal carotid arteries, petrous, cavernous and supra clinoid internal carotid arteries. Patent anterior communicating artery. Patent anterior and middle cerebral arteries, mild luminal irregularity. Developmentally smaller LEFT MCA. No large vessel occlusion, significant stenosis, contrast extravasation or aneurysm. POSTERIOR CIRCULATION: Patent vertebral arteries, vertebrobasilar junction and basilar artery, as well as main branch vessels. Patent posterior cerebral arteries mild luminal irregularity. No large vessel occlusion, significant stenosis, contrast extravasation or aneurysm. VENOUS SINUSES: Major dural venous sinuses are patent though not tailored for evaluation on this angiographic examination. ANATOMIC VARIANTS: None. DELAYED PHASE: Not performed. MIP images reviewed. IMPRESSION: CTA NECK: 1. RIGHT carotid bulb intramural hematoma, less likely atypical fibromuscular dysplasia. No flow-limiting stenosis. 2. Patent vertebral arteries. CTA HEAD: 1.  No emergent large vessel occlusion or flow-limiting stenosis. 2. Mild intracranial atherosclerosis, less likely vasculopathy. Critical Value/emergent results were called by telephone at the time of interpretation on 11/10/2017 at 9:45 pm to Dr. Roland Rack , who verbally acknowledged these results. Electronically Signed   By: Elon Alas M.D.   On: 11/10/2017 21:51   Mr Brain Wo Contrast  Result Date: 11/11/2017 CLINICAL DATA:  42 y/o F; change in neuro exam, evaluation for stroke. Follow-up of leg numbness and headache. EXAM: MRI HEAD WITHOUT CONTRAST TECHNIQUE: Multiplanar, multiecho pulse sequences of the brain and surrounding structures were obtained without intravenous contrast. COMPARISON:  11/11/2017 CT head, CTA head, CT perfusion head. FINDINGS: Brain: There several  scattered subcentimeter foci of reduced diffusion throughout the right frontal and parietal cortices, right temporoparietal junction, right frontal periventricular white matter, and the right caudate nucleus compatible with acute/early subacute infarction. No associated hemorrhage or mass effect. No extra-axial collection, hydrocephalus, focal mass effect of the brain, effacement of basilar cisterns, or herniation. Vascular: Normal flow voids. Skull and upper cervical spine: Normal marrow signal. Sinuses/Orbits: Negative. Other: None. 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 No associated hemorrhage or mass effect. These results were called by telephone at the time of interpretation on 11/11/2017 at 5:41 am to Dr. Leonel Ramsay, who verbally acknowledged these results. Electronically Signed   By: Kristine Garbe M.D.   On: 11/11/2017 05:45   Ct Cerebral Perfusion W Contrast  Result Date: 11/11/2017 CLINICAL DATA:  42 y/o F; change in neuro exam. Stroke for follow-up. EXAM: CT ANGIOGRAPHY HEAD AND NECK CT PERFUSION BRAIN TECHNIQUE: Multidetector CT imaging of the head and neck was performed using the standard protocol during bolus administration of intravenous contrast. Multiplanar CT image reconstructions and MIPs were obtained to evaluate the vascular anatomy. Carotid stenosis measurements (when applicable) are obtained utilizing NASCET criteria, using the distal internal carotid diameter as the denominator. Multiphase CT imaging of the brain was performed following IV bolus contrast injection. Subsequent parametric perfusion maps were calculated using RAPID software. CONTRAST:  90mL ISOVUE-370 IOPAMIDOL (ISOVUE-370) INJECTION 76% 90 cc Isovue 370 COMPARISON:  None. 11/10/2017  CT angiogram of the head and neck. FINDINGS: CT HEAD FINDINGS Brain: No evidence of acute infarction, hemorrhage, hydrocephalus, extra-axial  collection or mass lesion/mass effect. Vascular: No hyperdense vessel or unexpected calcification. Skull: Normal. Negative for fracture or focal lesion. Sinuses/Orbits: No acute finding. Other: None. ASPECTS ([place] Stroke Program Early CT Score) - Ganglionic level infarction (caudate, lentiform nuclei, internal capsule, insula, M1-M3 cortex): 7 - Supraganglionic infarction (M4-M6 cortex): 3 Total score (0-10 with 10 being normal): 10 Review of the MIP images confirms the above findings CTA NECK FINDINGS Aortic arch: Bovine variant branching. Imaged portion shows no evidence of aneurysm or dissection. No significant stenosis of the major arch vessel origins. Right carotid system: No evidence of dissection, stenosis (50% or greater) or occlusion. Focal posterior wall thickening of the left proximal ICA is less pronounced on the current study than on the prior CT angiogram of the neck (series 9, image 73). Left carotid system: No evidence of dissection, stenosis (50% or greater) or occlusion. Vertebral arteries: Codominant. No evidence of dissection, stenosis (50% or greater) or occlusion. Skeleton: No acute osseous abnormality. Other neck: Negative. Upper chest: Negative. Review of the MIP images confirms the above findings CTA HEAD FINDINGS Anterior circulation: No significant stenosis, proximal occlusion, aneurysm, or vascular malformation. Posterior circulation: No significant stenosis, proximal occlusion, aneurysm, or vascular malformation. Venous sinuses: As permitted by contrast timing, patent. Anatomic variants: None significant. Dominant right A1 and mildly larger right ICA likely due to increased flow when compared with the smaller left A1 and ICA. Review of the MIP images confirms the above findings CT Brain Perfusion Findings: CBF (<30%) Volume: 13mL Perfusion (Tmax>6.0s) volume: 61mL Mismatch Volume: 59mL Infarction Location:Not identified. There are a few foci of T-max greater than 4 seconds in the right  MCA distribution. IMPRESSION: 1. Patent carotid and vertebral arteries. No dissection, aneurysm, or hemodynamically significant stenosis utilizing NASCET criteria. 2. Focal posterior wall thickening of the right proximal ICA is less pronounced on the current study than on the prior study. Findings may represent a soft plaque rupture. Additionally, given neck being held in flexion on the prior study, this finding may have represented a vessel kink. 3. Stable patent anterior and posterior intracranial circulation. No large vessel occlusion, aneurysm, or significant stenosis. 4. Stable negative noncontrast CT of the head. 5. No acute stroke or ischemic penumbra by CT perfusion criteria. There are a few foci of T-max greater than 4 seconds in the right MCA distribution which may reflect small vessel ischemic changes. These results were called by telephone at the time of interpretation on 11/11/2017 at 3:30 am to Dr. Roland Rack , who verbally acknowledged these results. Electronically Signed   By: Kristine Garbe M.D.   On: 11/11/2017 03:29   Ct Head Code Stroke Wo Contrast  Result Date: 11/10/2017 CLINICAL DATA:  Code stroke. Acute onset lower extremity weakness, felt to RIGHT. RIGHT vision changes. History of TIA. EXAM: CT HEAD WITHOUT CONTRAST TECHNIQUE: Contiguous axial images were obtained from the base of the skull through the vertex without intravenous contrast. COMPARISON:  None. FINDINGS: BRAIN: No intraparenchymal hemorrhage, mass effect nor midline shift. The ventricles and sulci are normal. No acute large vascular territory infarcts. No abnormal extra-axial fluid collections. 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 Basal cisterns are patent. VASCULAR: Unremarkable. SKULL/SOFT TISSUES: No skull fracture. No significant soft tissue swelling. ORBITS/SINUSES: The included ocular globes and orbital contents are normal.Trace  paranasal sinus mucosal thickening. Mastoid  air cells are well aerated. OTHER: None. ASPECTS [facility] Stroke Program Early CT Score) - Ganglionic level infarction (caudate, lentiform nuclei, internal capsule, insula, M1-M3 cortex): 7 - Supraganglionic infarction (M4-M6 cortex): 3 Total score (0-10 with 10 being normal): 10 IMPRESSION: 1. Negative non-contrast CT HEAD. 2. ASPECTS is 10. 3. Critical Value/emergent results text paged to [place], Neurology via AMION secure system on 11/10/2017 at 9:37 pm, including interpreting physician's phone number. Electronically Signed   By: Elon Alas M.D.   On: 11/10/2017 21:38     LOS: 1 day   Oren Binet, MD  [facility]  If 7PM-7AM, please contact night-coverage  Please page via [email or web address]-click on MD name and type text message  11/12/2017, 2:28 PM

## 2017-11-12 NOTE — Progress Notes (Signed)
ANTICOAGULATION CONSULT NOTE - Follow Up Consult  Pharmacy Consult for heparin Indication: stroke  Labs: Recent Labs    11/10/17 2125 11/10/17 2129 11/11/17 0024 11/11/17 1111 11/11/17 1818 11/12/17 0219  HGB 8.2* 6.0* 5.6* 8.6*  --   --   HCT 24.0* 23.2* 22.2* 30.3*  --   --   PLT  --  PLATELET CLUMPS NOTED ON SMEAR, UNABLE TO ESTIMATE 1,301* 1,170*  --   --   APTT  --  26  --   --   --   --   LABPROT  --  14.7  --   --   --   --   INR  --  1.16  --   --   --   --   HEPARINUNFRC  --   --   --  0.17* 0.26* 0.55  CREATININE 0.90 0.96  --  0.76  --   --     Assessment: 42yo female now slightly supratherapeutic on heparin after rate change.  Goal of Therapy:  Heparin level 0.3-0.5 units/ml   Plan:  Will decrease heparin gtt by 1-2 units/kg/hr to 1200 units/hr and check level in 6 hours.    Wynona Neat, PharmD, BCPS  11/12/2017,3:03 AM

## 2017-11-12 NOTE — Progress Notes (Signed)
*  Preliminary Results* Bilateral lower extremity venous duplex completed. Bilateral lower extremities are negative for deep vein thrombosis. There is no evidence of Baker's cyst bilaterally.  11/12/2017 1:54 PM Maudry Mayhew, MHA, RVT, RDCS, RDMS

## 2017-11-12 NOTE — Progress Notes (Signed)
11/12/17 0305  What Happened  Was fall witnessed? Yes  Who witnessed fall? Nyra Market., RN  Patients activity before fall bathroom-assisted;to/from bed, chair, or stretcher  Point of contact hip/leg  Was patient injured? No  Follow Up  MD notified Tylene Fantasia  Time MD notified [phone]  Family notified No- patient refusal  Additional tests No  Simple treatment Other (comment) (None)  Progress note created (see row info) Yes  Adult Fall Risk Assessment  Risk Factor Category (scoring not indicated) Fall has occurred during this admission (document High fall risk)  Patient Fall Risk Level High fall risk  Adult Fall Risk Interventions  Required Bundle Interventions *See Row Information* High fall risk - low, moderate, and high requirements implemented  Additional Interventions Use of appropriate toileting equipment (bedpan, BSC, etc.)  Screening for Fall Injury Risk (To be completed on HIGH fall risk patients) - Assessing Need for Low Bed  Risk For Fall Injury- Low Bed Criteria Previous fall this admission  Will Implement Low Bed and Floor Mats Yes  Vitals  Temp 98.7 F (37.1 C)  Temp Source Oral  BP (!) 142/98  MAP (mmHg) 114  BP Location Left Arm  BP Method Automatic  Patient Position (if appropriate) Lying  Pulse Rate 69  Pulse Rate Source Monitor  Resp 16  Oxygen Therapy  SpO2 100 %  O2 Device Room Air  Pain Assessment  Pain Scale 0-10  Pain Score 7  Pain Type Acute pain  Pain Location Head  Pain Orientation Left;Right;Anterior  Pain Descriptors / Indicators Headache  Pain Frequency Occasional  Pain Onset Awakened from sleep  Patients Stated Pain Goal 0  Pain Intervention(s) Medication (See eMAR)  Neurological  Neuro (WDL) X  Level of Consciousness Alert  Orientation Level Oriented X4  Cognition Appropriate at baseline;Appropriate attention/concentration;Appropriate judgement;Appropriate safety awareness  Speech Clear  Pupil Assessment  Yes  R Pupil Size (mm) 4  R  Pupil Shape Round  R Pupil Reaction Brisk  L Pupil Size (mm) 4  L Pupil Shape Round  L Pupil Reaction Brisk  Additional Pupil Assessments No  Motor Function/Sensation Assessment Grip;Dorsiflexion;Plantar flexion  R Hand Grip Present;Moderate  L Hand Grip Present;Moderate   R Foot Dorsiflexion Present;Moderate  L Foot Dorsiflexion Present;Weak  R Foot Plantar Flexion Present;Moderate  L Foot Plantar Flexion Present;Weak  RUE Sensation Full sensation;No numbness;No pain;No tingling  RUE Motor Strength 5  LUE Sensation Full sensation;No numbness;No pain;No tingling  LUE Motor Strength 5  RLE Sensation Full sensation;No numbness;No pain;No tingling  RLE Motor Strength 5  LLE Sensation Numbness;Tingling  LLE Motor Strength 4  Neuro Symptoms None  Glasgow Coma Scale  Eye Opening 4  Best Verbal Response (NON-intubated) 5  Best Motor Response 6  Glasgow Coma Scale Score 15  NIH Stroke Scale ( + Modified Stroke Scale Criteria)   Interval Other (Comment) (Post fall)  Level of Consciousness (1a.)    0  LOC Questions (1b. )   + 0  LOC Commands (1c. )   +  0  Best Gaze (2. )  + 0  Visual (3. )  + 0  Facial Palsy (4. )     0  Motor Arm, Left (5a. )   + 0  Motor Arm, Right (5b. )   + 0  Motor Leg, Left (6a. )   + 1  Motor Leg, Right (6b. )   + 0  Limb Ataxia (7. ) 1  Sensory (8. )   +  0  Best Language (9. )   + 0  Dysarthria (10. ) 0  Extinction/Inattention (11.)   + 0  Modified SS Total  + 1  Complete NIHSS TOTAL 2  Musculoskeletal  Musculoskeletal (WDL) X  Assistive Device BSC  Generalized Weakness Yes  Weight Bearing Restrictions No  Musculoskeletal Details  LUE Full movement  LLE Full movement;Weakness  Integumentary  Integumentary (WDL) WDL  Pain Assessment  Date Pain First Started 11/12/17  Result of Injury No  Pain Assessment  Work-Related Injury No

## 2017-11-12 NOTE — Progress Notes (Signed)
ANTICOAGULATION CONSULT NOTE - Follow Up  Pharmacy Consult for heparin Indication: stroke protocol  Heparin Dosing Weight: 66.8 kg  Labs: Recent Labs    11/10/17 2125  11/10/17 2129 11/11/17 0024  11/11/17 1111 11/11/17 1818 11/12/17 0219 11/12/17 0838  HGB 8.2*  --  6.0* 5.6*  --  8.6*  --  8.6*  --   HCT 24.0*  --  23.2* 22.2*  --  30.3*  --  29.6*  --   PLT  --    < > PLATELET CLUMPS NOTED ON SMEAR, UNABLE TO ESTIMATE 1,301*  --  1,170*  --  1,081*  --   APTT  --   --  26  --   --   --   --   --   --   LABPROT  --   --  14.7  --   --   --   --   --   --   INR  --   --  1.16  --   --   --   --   --   --   HEPARINUNFRC  --   --   --   --    < > 0.17* 0.26* 0.55 0.46  CREATININE 0.90  --  0.96  --   --  0.76  --   --   --    < > = values in this interval not displayed.    Assessment: 42 yo F presenting as code stroke. Pharmacy consulted by Neurology to dose heparin per stroke protocol. Not on anticoagulation PTA. Will target lower goal with no boluses per protocol.  Heparin level therapeutic on 1200 units/hr (HL 0.46).  Pt currently with heavy bleeding on menstrual cycle but otherwise no bleeding noted.  Hgb improved 5.6 > 8.6 s/p transfusion.    Goal of Therapy:  Heparin level 0.3-0.5 units/ml, no boluses Monitor platelets by anticoagulation protocol: Yes   Plan:  No boluses.  Continue heparin at 1200 units/hr 6h confirmatory heparin level Daily heparin level/CBC Monitor s/sx bleeding F/u Neuro plans - A1c, lipid panel, TEE, LE doppler  Tene Gato A. Levada Dy, PharmD, [place] Pager: [phone] Please utilize Amion for appropriate phone number to reach the unit pharmacist ([place])   11/12/2017 9:55 AM

## 2017-11-12 NOTE — Progress Notes (Signed)
STROKE TEAM PROGRESS NOTE   SUBJECTIVE (INTERVAL HISTORY) Patient sitting in chair, no complaints, no acute event overnight.  LE venous Doppler negative.  Pending TEE in a.m.  Still on heparin IV, started Coumadin.  Hemoglobin stable after transfusion.  Platelets still high, considering reactive due to profound anemia.  Will need to be monitored as outpatient if persistently high after anemia corrected, need oncology referral for thrombocythemia.   OBJECTIVE Temp:  [98.7 F (37.1 C)-99.3 F (37.4 C)] 98.7 F (37.1 C) (09/24 1221) Pulse Rate:  [68-70] 69 (09/24 1221) Cardiac Rhythm: Normal sinus rhythm (09/24 0937) Resp:  [16-18] 18 (09/24 1221) BP: (128-143)/(88-111) 136/92 (09/24 1643) SpO2:  [83 %-100 %] 100 % (09/24 1221)  Recent Labs  Lab 11/10/17 2118  GLUCAP 121*   Recent Labs  Lab 11/10/17 2125 11/10/17 2129 11/11/17 1111  NA 139 136 139  K 4.0 3.9 3.6  CL 104 104 106  CO2  --  22 23  GLUCOSE 117* 120* 101*  BUN 7 7 <5*  CREATININE 0.90 0.96 0.76  CALCIUM  --  9.1 9.0   Recent Labs  Lab 11/10/17 2129  AST 34  ALT 20  ALKPHOS 63  BILITOT 0.6  PROT 7.7  ALBUMIN 4.2   Recent Labs  Lab 11/10/17 2125 11/10/17 2129 11/11/17 0024 11/11/17 1111 11/12/17 0219  WBC  --  16.8* 16.4* 17.5* 15.4*  NEUTROABS  --  14.1*  --   --   --   HGB 8.2* 6.0* 5.6* 8.6* 8.6*  HCT 24.0* 23.2* 22.2* 30.3* 29.6*  MCV  --  61.9* 61.2* 66.7* 66.1*  PLT  --  PLATELET CLUMPS NOTED ON SMEAR, UNABLE TO ESTIMATE 1,301* 1,170* 1,081*   No results for input(s): CKTOTAL, CKMB, CKMBINDEX, TROPONINI in the last 168 hours. Recent Labs    11/10/17 2129  LABPROT 14.7  INR 1.16   Recent Labs    11/11/17 0235  COLORURINE YELLOW  LABSPEC 1.038*  PHURINE 8.0  GLUCOSEU NEGATIVE  HGBUR LARGE*  BILIRUBINUR NEGATIVE  KETONESUR NEGATIVE  PROTEINUR NEGATIVE  NITRITE NEGATIVE  LEUKOCYTESUR NEGATIVE       Component Value Date/Time   CHOL 52 11/12/2017 0219   TRIG 96 11/12/2017 0219    HDL 28 (L) 11/12/2017 0219   CHOLHDL 1.9 11/12/2017 0219   VLDL 19 11/12/2017 0219   LDLCALC 5 11/12/2017 0219   Lab Results  Component Value Date   HGBA1C 5.6 11/12/2017      Component Value Date/Time   LABOPIA NONE DETECTED 11/11/2017 0235   COCAINSCRNUR NONE DETECTED 11/11/2017 0235   LABBENZ NONE DETECTED 11/11/2017 0235   AMPHETMU NONE DETECTED 11/11/2017 0235   THCU NONE DETECTED 11/11/2017 0235   LABBARB NONE DETECTED 11/11/2017 0235    Recent Labs  Lab 11/10/17 2126  ETH <10    I have personally reviewed the radiological images below and agree with the radiology interpretations.  Ct Angio Head W Or Wo Contrast  Result Date: 11/11/2017 CLINICAL DATA:  42 y/o F; change in neuro exam. Stroke for follow-up. EXAM: CT ANGIOGRAPHY HEAD AND NECK CT PERFUSION BRAIN TECHNIQUE: Multidetector CT imaging of the head and neck was performed using the standard protocol during bolus administration of intravenous contrast. Multiplanar CT image reconstructions and MIPs were obtained to evaluate the vascular anatomy. Carotid stenosis measurements (when applicable) are obtained utilizing NASCET criteria, using the distal internal carotid diameter as the denominator. Multiphase CT imaging of the brain was performed following IV bolus contrast injection. Subsequent  parametric perfusion maps were calculated using RAPID software. CONTRAST:  66mL ISOVUE-370 IOPAMIDOL (ISOVUE-370) INJECTION 76% 90 cc Isovue 370 COMPARISON:  None. 11/10/2017 CT angiogram of the head and neck. FINDINGS: CT HEAD FINDINGS Brain: No evidence of acute infarction, hemorrhage, hydrocephalus, extra-axial collection or mass lesion/mass effect. Vascular: No hyperdense vessel or unexpected calcification. Skull: Normal. Negative for fracture or focal lesion. Sinuses/Orbits: No acute finding. Other: None. ASPECTS ([place] Stroke Program Early CT Score) - Ganglionic level infarction (caudate, lentiform nuclei, internal capsule, insula,  M1-M3 cortex): 7 - Supraganglionic infarction (M4-M6 cortex): 3 Total score (0-10 with 10 being normal): 10 Review of the MIP images confirms the above findings CTA NECK FINDINGS Aortic arch: Bovine variant branching. Imaged portion shows no evidence of aneurysm or dissection. No significant stenosis of the major arch vessel origins. Right carotid system: No evidence of dissection, stenosis (50% or greater) or occlusion. Focal posterior wall thickening of the left proximal ICA is less pronounced on the current study than on the prior CT angiogram of the neck (series 9, image 73). Left carotid system: No evidence of dissection, stenosis (50% or greater) or occlusion. Vertebral arteries: Codominant. No evidence of dissection, stenosis (50% or greater) or occlusion. Skeleton: No acute osseous abnormality. Other neck: Negative. Upper chest: Negative. Review of the MIP images confirms the above findings CTA HEAD FINDINGS Anterior circulation: No significant stenosis, proximal occlusion, aneurysm, or vascular malformation. Posterior circulation: No significant stenosis, proximal occlusion, aneurysm, or vascular malformation. Venous sinuses: As permitted by contrast timing, patent. Anatomic variants: None significant. Dominant right A1 and mildly larger right ICA likely due to increased flow when compared with the smaller left A1 and ICA. Review of the MIP images confirms the above findings CT Brain Perfusion Findings: CBF (<30%) Volume: 11mL Perfusion (Tmax>6.0s) volume: 66mL Mismatch Volume: 53mL Infarction Location:Not identified. There are a few foci of T-max greater than 4 seconds in the right MCA distribution. IMPRESSION: 1. Patent carotid and vertebral arteries. No dissection, aneurysm, or hemodynamically significant stenosis utilizing NASCET criteria. 2. Focal posterior wall thickening of the right proximal ICA is less pronounced on the current study than on the prior study. Findings may represent a soft plaque  rupture. Additionally, given neck being held in flexion on the prior study, this finding may have represented a vessel kink. 3. Stable patent anterior and posterior intracranial circulation. No large vessel occlusion, aneurysm, or significant stenosis. 4. Stable negative noncontrast CT of the head. 5. No acute stroke or ischemic penumbra by CT perfusion criteria. There are a few foci of T-max greater than 4 seconds in the right MCA distribution which may reflect small vessel ischemic changes. These results were called by telephone at the time of interpretation on 11/11/2017 at 3:30 am to Dr. Roland Rack , who verbally acknowledged these results. Electronically Signed   By: Kristine Garbe M.D.   On: 11/11/2017 03:29   Ct Angio Head W Or Wo Contrast  Result Date: 11/10/2017 CLINICAL DATA:  Acute onset lower extremity weakness, fell to RIGHT. Vision changes. EXAM: CT ANGIOGRAPHY HEAD AND NECK TECHNIQUE: Multidetector CT imaging of the head and neck was performed using the standard protocol during bolus administration of intravenous contrast. Multiplanar CT image reconstructions and MIPs were obtained to evaluate the vascular anatomy. Carotid stenosis measurements (when applicable) are obtained utilizing NASCET criteria, using the distal internal carotid diameter as the denominator. COMPARISON:  CT HEAD November 10, 2017 at 9:33 p.m. FINDINGS: CTA NECK FINDINGS: AORTIC ARCH: Normal appearance of the thoracic  arch, 2 vessel arch is a normal variant. The origins of the innominate, left Common carotid artery and subclavian artery are widely patent. RIGHT CAROTID SYSTEM: Common carotid artery is patent. Shelf-like filling defect RIGHT carotid bulb without hemodynamically significant stenosis by NASCET criteria. LEFT CAROTID SYSTEM: Common carotid artery is patent. Normal appearance of the carotid bifurcation without hemodynamically significant stenosis by NASCET criteria. Normal appearance of the  internal carotid artery. VERTEBRAL ARTERIES:Codominant vertebral arteries. Normal appearance of the vertebral arteries, widely patent. SKELETON: No acute osseous process though bone windows have not been submitted. OTHER NECK: Soft tissues of the neck are nonacute though, not tailored for evaluation. UPPER CHEST: Included lung apices are clear. No superior mediastinal lymphadenopathy. CTA HEAD FINDINGS: ANTERIOR CIRCULATION: Patent cervical internal carotid arteries, petrous, cavernous and supra clinoid internal carotid arteries. Patent anterior communicating artery. Patent anterior and middle cerebral arteries, mild luminal irregularity. Developmentally smaller LEFT MCA. No large vessel occlusion, significant stenosis, contrast extravasation or aneurysm. POSTERIOR CIRCULATION: Patent vertebral arteries, vertebrobasilar junction and basilar artery, as well as main branch vessels. Patent posterior cerebral arteries mild luminal irregularity. No large vessel occlusion, significant stenosis, contrast extravasation or aneurysm. VENOUS SINUSES: Major dural venous sinuses are patent though not tailored for evaluation on this angiographic examination. ANATOMIC VARIANTS: None. DELAYED PHASE: Not performed. MIP images reviewed. IMPRESSION: CTA NECK: 1. RIGHT carotid bulb intramural hematoma, less likely atypical fibromuscular dysplasia. No flow-limiting stenosis. 2. Patent vertebral arteries. CTA HEAD: 1.  No emergent large vessel occlusion or flow-limiting stenosis. 2. Mild intracranial atherosclerosis, less likely vasculopathy. Critical Value/emergent results were called by telephone at the time of interpretation on 11/10/2017 at 9:45 pm to Dr. Roland Rack , who verbally acknowledged these results. Electronically Signed   By: Elon Alas M.D.   On: 11/10/2017 21:51   Ct Angio Neck W Or Wo Contrast  Result Date: 11/11/2017 CLINICAL DATA:  42 y/o F; change in neuro exam. Stroke for follow-up. EXAM: CT  ANGIOGRAPHY HEAD AND NECK CT PERFUSION BRAIN TECHNIQUE: Multidetector CT imaging of the head and neck was performed using the standard protocol during bolus administration of intravenous contrast. Multiplanar CT image reconstructions and MIPs were obtained to evaluate the vascular anatomy. Carotid stenosis measurements (when applicable) are obtained utilizing NASCET criteria, using the distal internal carotid diameter as the denominator. Multiphase CT imaging of the brain was performed following IV bolus contrast injection. Subsequent parametric perfusion maps were calculated using RAPID software. CONTRAST:  59mL ISOVUE-370 IOPAMIDOL (ISOVUE-370) INJECTION 76% 90 cc Isovue 370 COMPARISON:  None. 11/10/2017 CT angiogram of the head and neck. FINDINGS: CT HEAD FINDINGS Brain: No evidence of acute infarction, hemorrhage, hydrocephalus, extra-axial collection or mass lesion/mass effect. Vascular: No hyperdense vessel or unexpected calcification. Skull: Normal. Negative for fracture or focal lesion. Sinuses/Orbits: No acute finding. Other: None. ASPECTS ([place] Stroke Program Early CT Score) - Ganglionic level infarction (caudate, lentiform nuclei, internal capsule, insula, M1-M3 cortex): 7 - Supraganglionic infarction (M4-M6 cortex): 3 Total score (0-10 with 10 being normal): 10 Review of the MIP images confirms the above findings CTA NECK FINDINGS Aortic arch: Bovine variant branching. Imaged portion shows no evidence of aneurysm or dissection. No significant stenosis of the major arch vessel origins. Right carotid system: No evidence of dissection, stenosis (50% or greater) or occlusion. Focal posterior wall thickening of the left proximal ICA is less pronounced on the current study than on the prior CT angiogram of the neck (series 9, image 73). Left carotid system: No evidence of dissection, stenosis (  50% or greater) or occlusion. Vertebral arteries: Codominant. No evidence of dissection, stenosis (50% or greater)  or occlusion. Skeleton: No acute osseous abnormality. Other neck: Negative. Upper chest: Negative. Review of the MIP images confirms the above findings CTA HEAD FINDINGS Anterior circulation: No significant stenosis, proximal occlusion, aneurysm, or vascular malformation. Posterior circulation: No significant stenosis, proximal occlusion, aneurysm, or vascular malformation. Venous sinuses: As permitted by contrast timing, patent. Anatomic variants: None significant. Dominant right A1 and mildly larger right ICA likely due to increased flow when compared with the smaller left A1 and ICA. Review of the MIP images confirms the above findings CT Brain Perfusion Findings: CBF (<30%) Volume: 64mL Perfusion (Tmax>6.0s) volume: 55mL Mismatch Volume: 74mL Infarction Location:Not identified. There are a few foci of T-max greater than 4 seconds in the right MCA distribution. IMPRESSION: 1. Patent carotid and vertebral arteries. No dissection, aneurysm, or hemodynamically significant stenosis utilizing NASCET criteria. 2. Focal posterior wall thickening of the right proximal ICA is less pronounced on the current study than on the prior study. Findings may represent a soft plaque rupture. Additionally, given neck being held in flexion on the prior study, this finding may have represented a vessel kink. 3. Stable patent anterior and posterior intracranial circulation. No large vessel occlusion, aneurysm, or significant stenosis. 4. Stable negative noncontrast CT of the head. 5. No acute stroke or ischemic penumbra by CT perfusion criteria. There are a few foci of T-max greater than 4 seconds in the right MCA distribution which may reflect small vessel ischemic changes. These results were called by telephone at the time of interpretation on 11/11/2017 at 3:30 am to Dr. Roland Rack , who verbally acknowledged these results. Electronically Signed   By: Kristine Garbe M.D.   On: 11/11/2017 03:29   Ct Angio Neck W Or  Wo Contrast  Result Date: 11/10/2017 CLINICAL DATA:  Acute onset lower extremity weakness, fell to RIGHT. Vision changes. EXAM: CT ANGIOGRAPHY HEAD AND NECK TECHNIQUE: Multidetector CT imaging of the head and neck was performed using the standard protocol during bolus administration of intravenous contrast. Multiplanar CT image reconstructions and MIPs were obtained to evaluate the vascular anatomy. Carotid stenosis measurements (when applicable) are obtained utilizing NASCET criteria, using the distal internal carotid diameter as the denominator. COMPARISON:  CT HEAD November 10, 2017 at 9:33 p.m. FINDINGS: CTA NECK FINDINGS: AORTIC ARCH: Normal appearance of the thoracic arch, 2 vessel arch is a normal variant. The origins of the innominate, left Common carotid artery and subclavian artery are widely patent. RIGHT CAROTID SYSTEM: Common carotid artery is patent. Shelf-like filling defect RIGHT carotid bulb without hemodynamically significant stenosis by NASCET criteria. LEFT CAROTID SYSTEM: Common carotid artery is patent. Normal appearance of the carotid bifurcation without hemodynamically significant stenosis by NASCET criteria. Normal appearance of the internal carotid artery. VERTEBRAL ARTERIES:Codominant vertebral arteries. Normal appearance of the vertebral arteries, widely patent. SKELETON: No acute osseous process though bone windows have not been submitted. OTHER NECK: Soft tissues of the neck are nonacute though, not tailored for evaluation. UPPER CHEST: Included lung apices are clear. No superior mediastinal lymphadenopathy. CTA HEAD FINDINGS: ANTERIOR CIRCULATION: Patent cervical internal carotid arteries, petrous, cavernous and supra clinoid internal carotid arteries. Patent anterior communicating artery. Patent anterior and middle cerebral arteries, mild luminal irregularity. Developmentally smaller LEFT MCA. No large vessel occlusion, significant stenosis, contrast extravasation or aneurysm.  POSTERIOR CIRCULATION: Patent vertebral arteries, vertebrobasilar junction and basilar artery, as well as main branch vessels. Patent posterior cerebral arteries mild luminal  irregularity. No large vessel occlusion, significant stenosis, contrast extravasation or aneurysm. VENOUS SINUSES: Major dural venous sinuses are patent though not tailored for evaluation on this angiographic examination. ANATOMIC VARIANTS: None. DELAYED PHASE: Not performed. MIP images reviewed. IMPRESSION: CTA NECK: 1. RIGHT carotid bulb intramural hematoma, less likely atypical fibromuscular dysplasia. No flow-limiting stenosis. 2. Patent vertebral arteries. CTA HEAD: 1.  No emergent large vessel occlusion or flow-limiting stenosis. 2. Mild intracranial atherosclerosis, less likely vasculopathy. Critical Value/emergent results were called by telephone at the time of interpretation on 11/10/2017 at 9:45 pm to Dr. Roland Rack , who verbally acknowledged these results. Electronically Signed   By: Elon Alas M.D.   On: 11/10/2017 21:51   Mr Brain Wo Contrast  Result Date: 11/11/2017 CLINICAL DATA:  42 y/o F; change in neuro exam, evaluation for stroke. Follow-up of leg numbness and headache. EXAM: MRI HEAD WITHOUT CONTRAST TECHNIQUE: Multiplanar, multiecho pulse sequences of the brain and surrounding structures were obtained without intravenous contrast. COMPARISON:  11/11/2017 CT head, CTA head, CT perfusion head. FINDINGS: Brain: There several scattered subcentimeter foci of reduced diffusion throughout the right frontal and parietal cortices, right temporoparietal junction, right frontal periventricular white matter, and the right caudate nucleus compatible with acute/early subacute infarction. No associated hemorrhage or mass effect. No extra-axial collection, hydrocephalus, focal mass effect of the brain, effacement of basilar cisterns, or herniation. Vascular: Normal flow voids. Skull and upper cervical spine: Normal  marrow signal. Sinuses/Orbits: Negative. Other: None. 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 No associated hemorrhage or mass effect. These results were called by telephone at the time of interpretation on 11/11/2017 at 5:41 am to Dr. Leonel Ramsay, who verbally acknowledged these results. Electronically Signed   By: Kristine Garbe M.D.   On: 11/11/2017 05:45   Ct Cerebral Perfusion W Contrast  Result Date: 11/11/2017 CLINICAL DATA:  42 y/o F; change in neuro exam. Stroke for follow-up. EXAM: CT ANGIOGRAPHY HEAD AND NECK CT PERFUSION BRAIN TECHNIQUE: Multidetector CT imaging of the head and neck was performed using the standard protocol during bolus administration of intravenous contrast. Multiplanar CT image reconstructions and MIPs were obtained to evaluate the vascular anatomy. Carotid stenosis measurements (when applicable) are obtained utilizing NASCET criteria, using the distal internal carotid diameter as the denominator. Multiphase CT imaging of the brain was performed following IV bolus contrast injection. Subsequent parametric perfusion maps were calculated using RAPID software. CONTRAST:  46mL ISOVUE-370 IOPAMIDOL (ISOVUE-370) INJECTION 76% 90 cc Isovue 370 COMPARISON:  None. 11/10/2017 CT angiogram of the head and neck. FINDINGS: CT HEAD FINDINGS Brain: No evidence of acute infarction, hemorrhage, hydrocephalus, extra-axial collection or mass lesion/mass effect. Vascular: No hyperdense vessel or unexpected calcification. Skull: Normal. Negative for fracture or focal lesion. Sinuses/Orbits: No acute finding. Other: None. ASPECTS ([place] Stroke Program Early CT Score) - Ganglionic level infarction (caudate, lentiform nuclei, internal capsule, insula, M1-M3 cortex): 7 - Supraganglionic infarction (M4-M6 cortex): 3 Total score (0-10 with 10 being normal): 10 Review of the MIP images confirms the above findings CTA  NECK FINDINGS Aortic arch: Bovine variant branching. Imaged portion shows no evidence of aneurysm or dissection. No significant stenosis of the major arch vessel origins. Right carotid system: No evidence of dissection, stenosis (50% or greater) or occlusion. Focal posterior wall thickening of the left proximal ICA is less pronounced on the current study than on the prior CT angiogram of the neck (series 9, image 73). Left carotid system: No evidence of dissection,  stenosis (50% or greater) or occlusion. Vertebral arteries: Codominant. No evidence of dissection, stenosis (50% or greater) or occlusion. Skeleton: No acute osseous abnormality. Other neck: Negative. Upper chest: Negative. Review of the MIP images confirms the above findings CTA HEAD FINDINGS Anterior circulation: No significant stenosis, proximal occlusion, aneurysm, or vascular malformation. Posterior circulation: No significant stenosis, proximal occlusion, aneurysm, or vascular malformation. Venous sinuses: As permitted by contrast timing, patent. Anatomic variants: None significant. Dominant right A1 and mildly larger right ICA likely due to increased flow when compared with the smaller left A1 and ICA. Review of the MIP images confirms the above findings CT Brain Perfusion Findings: CBF (<30%) Volume: 61mL Perfusion (Tmax>6.0s) volume: 52mL Mismatch Volume: 43mL Infarction Location:Not identified. There are a few foci of T-max greater than 4 seconds in the right MCA distribution. IMPRESSION: 1. Patent carotid and vertebral arteries. No dissection, aneurysm, or hemodynamically significant stenosis utilizing NASCET criteria. 2. Focal posterior wall thickening of the right proximal ICA is less pronounced on the current study than on the prior study. Findings may represent a soft plaque rupture. Additionally, given neck being held in flexion on the prior study, this finding may have represented a vessel kink. 3. Stable patent anterior and posterior  intracranial circulation. No large vessel occlusion, aneurysm, or significant stenosis. 4. Stable negative noncontrast CT of the head. 5. No acute stroke or ischemic penumbra by CT perfusion criteria. There are a few foci of T-max greater than 4 seconds in the right MCA distribution which may reflect small vessel ischemic changes. These results were called by telephone at the time of interpretation on 11/11/2017 at 3:30 am to Dr. Roland Rack , who verbally acknowledged these results. Electronically Signed   By: Kristine Garbe M.D.   On: 11/11/2017 03:29   Ct Head Code Stroke Wo Contrast  Result Date: 11/10/2017 CLINICAL DATA:  Code stroke. Acute onset lower extremity weakness, felt to RIGHT. RIGHT vision changes. History of TIA. EXAM: CT HEAD WITHOUT CONTRAST TECHNIQUE: Contiguous axial images were obtained from the base of the skull through the vertex without intravenous contrast. COMPARISON:  None. FINDINGS: BRAIN: No intraparenchymal hemorrhage, mass effect nor midline shift. The ventricles and sulci are normal. No acute large vascular territory infarcts. No abnormal extra-axial fluid collections. 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 Basal cisterns are patent. VASCULAR: Unremarkable. SKULL/SOFT TISSUES: No skull fracture. No significant soft tissue swelling. ORBITS/SINUSES: The included ocular globes and orbital contents are normal.Trace paranasal sinus mucosal thickening. Mastoid air cells are well aerated. OTHER: None. ASPECTS [facility] Stroke Program Early CT Score) - Ganglionic level infarction (caudate, lentiform nuclei, internal capsule, insula, M1-M3 cortex): 7 - Supraganglionic infarction (M4-M6 cortex): 3 Total score (0-10 with 10 being normal): 10 IMPRESSION: 1. Negative non-contrast CT HEAD. 2. ASPECTS is 10. 3. Critical Value/emergent results text paged to [place], Neurology via [employer] secure system on 11/10/2017  at 9:37 pm, including interpreting physician's phone number. Electronically Signed   By: Elon Alas M.D.   On: 11/10/2017 21:38   TTE  - Left ventricle: The cavity size was normal. Systolic function was   normal. The estimated ejection fraction was in the range of 60%   to 65%. Wall motion was normal; there were no regional wall   motion abnormalities. Left ventricular diastolic function   parameters were normal for the patient&'s age.   PHYSICAL EXAM  Temp:  [98.7 F (37.1 C)-99.3 F (37.4 C)] 98.7 F (37.1 C) (09/24  1221) Pulse Rate:  [68-70] 69 (09/24 1221) Resp:  [16-18] 18 (09/24 1221) BP: (128-143)/(88-111) 136/92 (09/24 1643) SpO2:  [83 %-100 %] 100 % (09/24 1221)  General - Well nourished, well developed, in no apparent distress.  Ophthalmologic - fundi not visualized due to noncooperation.  Cardiovascular - Regular rate and rhythm.  Mental Status -  Level of arousal and orientation to time, place, and person were intact. Language including expression, naming, repetition, comprehension was assessed and found intact. Attention span and concentration were normal. Fund of Knowledge was assessed and was intact.  Cranial Nerves II - XII - II - Visual field intact OS, right eye decreased visual acuity but still able to [facility]. III, IV, VI - Extraocular movements intact. V - Facial sensation intact bilaterally. VII - Facial movement intact bilaterally. VIII - Hearing & vestibular intact bilaterally. X - Palate elevates symmetrically. XI - Chin turning & shoulder shrug intact bilaterally. XII - Tongue protrusion intact.  Motor Strength - The patient's strength was normal in all extremities and pronator drift was absent except LLE 4/5 with giveaway weakness.  Bulk was normal and fasciculations were absent.   Motor Tone - Muscle tone was assessed at the neck and appendages and was normal.  Reflexes - The patient's reflexes were symmetrical in all extremities and she had  no pathological reflexes.  Sensory - Light touch, temperature/pinprick were assessed and were decreased sensation on the LLE with 50% compare to right.    Coordination - The patient had normal movements in the hands and feet with no ataxia or dysmetria.  Tremor was absent.  Gait and Station - deferred.   ASSESSMENT/PLAN [name] is a 42 y.o. female with history of TIA, depression, bipolar admitted for left sided weakness, numbness, and right eye vision loss. No tPA given due to outside window.    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  Continue heparin IV bridge, will start Coumadin with INR goal 2-3.  Repeat CTA neck in 2 to 3 months, if carotid thrombus resolved, Coumadin can be discontinued and transition to aspirin.  Patient counseled to be compliant with her antithrombotic medications  Ongoing aggressive stroke risk factor management  Therapy recommendations: Pending  Disposition: Pending  Right ICA/CCA thrombus  Etiology unclear  hypercoagulable work-up pending - so far negative  TEE in am  LE venous Doppler negative for DVT  Continue heparin IV  Severe anemia  Likely due to uterus fibroids  Likely chronic  Hemoglobin 8.2-6.0-5.6-PRBC-8.6-8.6  Monitor H&H  PRBC transfusion if hemoglobin less than 7  Significant thrombocythemia  Platelet [id number]  Likely reactive to severe  anemia  Although reactive thrombocythemia less likely causing stroke, but still there is a possibility  However, need close monitoring as outpatient.  If anemia corrected, however thrombocythemia persists, need oncology referral for thrombocythemia treatment  Hypertension . Stable on the high side  Long term BP goal normotensive  Other Stroke Risk Factors  Hx stroke/TIA  Other Active Problems  Depression  Bipolar   Hospital day # 1   Rosalin Hawking, MD PhD Stroke Neurology 11/12/2017 6:11 PM    To contact Stroke Continuity provider, please refer to [email or web address] After hours, contact General Neurology

## 2017-11-12 NOTE — Evaluation (Signed)
Physical Therapy Evaluation Patient Details Name: Mallory Robinson MRN: [id number] DOB: 1976/01/02 Today's Date: 11/12/2017   History of Present Illness  Pt is a 42 y.o. admitted 11/10/17 with L-side weakness, R eye vision loss, and fall. 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 PMH includes bipolar disorder, anemia, depression.    Clinical Impression  Pt presents with an overall decrease in functional mobility secondary to above. PTA, pt indep, works, and lives with family; can have 24/7 support available at d/c. Today, pt able to transfer and amb short distance with RW. Pt with LLE instability due to decreased sensation. Also demonstrates L-side inattention requiring cues to scan to left environment and attend to L-side of body. Pt motivated to return to indep PLOF and work; expect pt to progress well with mobility. Will follow acutely to address established goals.     Follow Up Recommendations Home health PT;Supervision/Assistance - 24 hour (progress to OP Neuro PT)    Equipment Recommendations  Rolling walker with 5" wheels    Recommendations for Other Services OT consult     Precautions / Restrictions Precautions Precautions: Fall Precaution Comments: Decreased LLE sensation; L side inattention Restrictions Weight Bearing Restrictions: No      Mobility  Bed Mobility Overal bed mobility: Needs Assistance Bed Mobility: Supine to Sit     Supine to sit: Supervision;HOB elevated     General bed mobility comments: No physical assist required, sat on R-side of bed, noted pt not using LUE to assist  Transfers Overall transfer level: Needs assistance Equipment used: None;Rolling walker (2 wheeled) Transfers: Sit to/from Stand Sit to Stand: Min assist;Supervision         General transfer comment: Stood from bed with minA to assist trunk elevation,  reliant on LUE support and cues to shift weight to L-side, L knee instability requiring modA to correct. Stood from bed and toilet with RW and supervision; reliance on RUE support   Ambulation/Gait Ambulation/Gait assistance: Counsellor (Feet): 30 Feet Assistive device: Rolling walker (2 wheeled) Gait Pattern/deviations: Step-to pattern;Decreased weight shift to left Gait velocity: Decreasee Gait velocity interpretation: <1.8 ft/sec, indicate of risk for recurrent falls General Gait Details: Slow amb with RW and close min guard due to initial L knee buckling; no L knee instability noted while walking, although pt not shifting much weight onto L side  Stairs            Wheelchair Mobility    Modified Rankin (Stroke Patients Only) Modified Rankin (Stroke Patients Only) Pre-Morbid Rankin Score: No significant disability Modified Rankin: Moderately severe disability     Balance Overall balance assessment: Needs assistance   Sitting balance-Leahy Scale: Good Sitting balance - Comments: Indep to don socks sitting EOB     Standing balance-Leahy Scale: Fair Standing balance comment: Can perform ADL tasks at sink without UE support                             Pertinent Vitals/Pain Pain Assessment: Faces Faces Pain Scale: Hurts a little bit Pain Location: Headache Pain Descriptors / Indicators: Headache Pain Intervention(s): Monitored during session;Premedicated before session    Home Living Family/patient expects to be discharged to:: Private residence Living Arrangements: Spouse/significant other;Children Available Help at Discharge: Family;Available 24 hours/day Type of Home: Apartment Home Access: Stairs to enter Entrance Stairs-Rails: Left Entrance Stairs-Number of  Steps: 2 flights Home Layout: One level Home Equipment: None Additional Comments: Lives with wife and 59 year old son. 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 Drives     Hand Dominance        Extremity/Trunk Assessment   Upper Extremity Assessment Upper Extremity Assessment: LUE deficits/detail LUE Deficits / Details: Slight dysmetria noted with finger-to-nose testing. Grossly 4/5 throughout    Lower Extremity Assessment Lower Extremity Assessment: LLE deficits/detail LLE Deficits / Details: L hip flex 4/5, knee ext 3/5, knee flex 3/5, ankle DF 3-/5; decreased light touch distally, pt reports "pins and needles" lower leg/foot LLE Sensation: decreased light touch;decreased proprioception LLE Coordination: decreased gross motor;decreased fine motor       Communication   Communication: No difficulties  Cognition Arousal/Alertness: Awake/alert Behavior During Therapy: WFL for tasks assessed/performed Overall Cognitive Status: Impaired/Different from baseline Area of Impairment: Problem solving;Safety/judgement;Following commands                       Following Commands: Follows multi-step commands consistently Safety/Judgement: Decreased awareness of deficits   Problem Solving: Requires verbal cues;Difficulty sequencing General Comments: Pt aware of L-side deficits; difficulty turning towards L-side and scanning to L, but able to correct with cues, requiring less cues by end of session      General Comments      Exercises     Assessment/Plan    PT Assessment Patient needs continued PT services  PT Problem List Decreased strength;Decreased activity tolerance;Decreased balance;Decreased mobility;Decreased cognition;Decreased knowledge of use of DME;Decreased coordination;Impaired sensation       PT Treatment Interventions DME instruction;Gait training;Stair training;Functional mobility training;Therapeutic activities;Therapeutic exercise;Balance training;Neuromuscular  re-education;Cognitive remediation;Patient/family education    PT Goals (Current goals can be found in the Care Plan section)  Acute Rehab PT Goals Patient Stated Goal: Get back to work PT Goal Formulation: With patient Time For Goal Achievement: 11/26/17 Potential to Achieve Goals: Good    Frequency Min 4X/week   Barriers to discharge        Co-evaluation               AM-PAC PT "6 Clicks" Daily Activity  Outcome Measure Difficulty turning over in bed (including adjusting bedclothes, sheets and blankets)?: None Difficulty moving from lying on back to sitting on the side of the bed? : A Little Difficulty sitting down on and standing up from a chair with arms (e.g., wheelchair, bedside commode, etc,.)?: A Little Help needed moving to and from a bed to chair (including a wheelchair)?: A Little Help needed walking in hospital room?: A Little Help needed climbing 3-5 steps with a railing? : A Little 6 Click Score: 19    End of Session Equipment Utilized During Treatment: Gait belt Activity Tolerance: Patient tolerated treatment well Patient left: in chair;with call bell/phone within reach;with chair alarm set Nurse Communication: Mobility status PT Visit Diagnosis: Other abnormalities of gait and mobility (R26.89);Other symptoms and signs involving the nervous system (R29.898)    Time: 9509-3267 PT Time Calculation (min) (ACUTE ONLY): 44 min   Charges:   PT Evaluation $PT Eval Moderate Complexity: 1 Mod PT Treatments $Gait Training: 8-22 mins $Therapeutic Activity: 8-22 mins       Mallory Robinson, PT, DPT Acute Rehabilitation Services  Pager [phone] Office [phone]  Mallory Robinson  11/12/2017, 9:51 AM

## 2017-11-12 NOTE — Progress Notes (Signed)
This RN informed patient that she would be transferred to a Low Bed. Explained the reasoning behind the bed, but patient refused to be transferred to the Low Bed.

## 2017-11-12 NOTE — Progress Notes (Signed)
ANTICOAGULATION CONSULT NOTE - Follow Up  Pharmacy Consult for heparin Indication: stroke protocol  Heparin Dosing Weight: 66.8 kg  Labs: Recent Labs    11/10/17 2125  11/10/17 2129 11/11/17 0024 11/11/17 1111  11/12/17 0219 11/12/17 0838 11/12/17 1417  HGB 8.2*  --  6.0* 5.6* 8.6*  --  8.6*  --   --   HCT 24.0*  --  23.2* 22.2* 30.3*  --  29.6*  --   --   PLT  --    < > PLATELET CLUMPS NOTED ON SMEAR, UNABLE TO ESTIMATE 1,301* 1,170*  --  1,081*  --   --   APTT  --   --  26  --   --   --   --   --   --   LABPROT  --   --  14.7  --   --   --   --   --   --   INR  --   --  1.16  --   --   --   --   --   --   HEPARINUNFRC  --   --   --   --  0.17*   < > 0.55 0.46 0.53  CREATININE 0.90  --  0.96  --  0.76  --   --   --   --    < > = values in this interval not displayed.    Assessment: 42 yo F presenting as code stroke. Pharmacy consulted by Neurology to dose heparin per stroke protocol. Not on anticoagulation PTA. Will target lower goal with no boluses per protocol.  Heparin level SUPRAtherapeutic on 1200 units/hr (HL 0.56).  Pt currently with heavy bleeding on menstrual cycle but otherwise no bleeding noted.  Hgb improved 5.6 > 8.6 s/p transfusion.    Goal of Therapy:  Heparin level 0.3-0.5 units/ml, no boluses Monitor platelets by anticoagulation protocol: Yes   Plan:   Decrease heparin to 1050 units/hr 6h  heparin level Daily heparin level/CBC Monitor s/sx bleeding   Jenalee Trevizo A. Levada Dy, PharmD, [place] Pager: [phone] Please utilize Amion for appropriate phone number to reach the unit pharmacist ([place])   11/12/2017 3:51 PM

## 2017-11-12 NOTE — Care Management Note (Addendum)
Case Management Note  Patient Details  Name: Mallory Robinson MRN: [id number] Date of Birth: [birth year]  Subjective/Objective: Admitted 11/10/17 with L-side weakness, R eye vision loss, and fall. Hx of bipolar disorder, anemia, depression.Resides with wife, Denman George.  Keigan Girten (Spouse)     [phone]       PCP: Dr. Revonda Humphrey  Action/Plan: Transition to home when medically stable with home health services to follow.   Will d/c to mom's  Address : [address] , [place] [address], [place], [place] [address]  Dr. Tanna Savoy Internal medicine [address] [place], [place] [address] [phone]  Hospital  Follow up appointment :11/19/2017 @ 3pm  Expected Discharge Date:                  Expected Discharge Plan:  [place]  In-House Referral:     Discharge planning Services  CM Consult  Post Acute Care Choice:    Choice offered to:  Patient  DME Arranged:  3-N-1, Walker rolling,tub bench DME Agency:  [place]:  PT,OT,SLP [facility] Agency:  [place] , pending MD' s order.  Status of Service:  Completed, signed off  If discussed at [place] of Stay Meetings, dates discussed:    Additional Comments:  Sharin Mons, RN 11/12/2017, 3:51 PM

## 2017-11-12 NOTE — Progress Notes (Signed)
ANTICOAGULATION CONSULT NOTE - Follow Up  Pharmacy Consult for Heparin  Indication: Stroke   Heparin Dosing Weight: 66.8 kg  Labs: Recent Labs    11/10/17 2125  11/10/17 2129 11/11/17 0024 11/11/17 1111  11/12/17 0219 11/12/17 0838 11/12/17 1417 11/12/17 2146  HGB 8.2*  --  6.0* 5.6* 8.6*  --  8.6*  --   --   --   HCT 24.0*  --  23.2* 22.2* 30.3*  --  29.6*  --   --   --   PLT  --    < > PLATELET CLUMPS NOTED ON SMEAR, UNABLE TO ESTIMATE 1,301* 1,170*  --  1,081*  --   --   --   APTT  --   --  26  --   --   --   --   --   --   --   LABPROT  --   --  14.7  --   --   --   --   --   --   --   INR  --   --  1.16  --   --   --   --   --   --   --   HEPARINUNFRC  --   --   --   --  0.17*   < > 0.55 0.46 0.53 0.31  CREATININE 0.90  --  0.96  --  0.76  --   --   --   --   --    < > = values in this interval not displayed.    Assessment: 42 yo F presenting as code stroke. Pharmacy consulted by Neurology to dose heparin per stroke protocol. Not on anticoagulation PTA. Will target lower goal with no boluses per protocol.  Heparin level is therapeutic x 1 after rate decrase  Goal of Therapy:  Heparin level 0.3-0.5 units/ml, no boluses Monitor platelets by anticoagulation protocol: Yes   Plan:   Cont heparin at 1050 units/hr Confirmatory heparin level with AM labs  Narda Bonds, PharmD, [place] Pharmacist Phone: [phone]

## 2017-11-12 NOTE — Progress Notes (Signed)
    [facility] has been requested to perform a transesophageal echocardiogram on [name] for Stroke.  After careful review of history and examination, the risks and benefits of transesophageal echocardiogram have been explained including risks of esophageal damage, perforation (1:10,000 risk), bleeding, pharyngeal hematoma as well as other potential complications associated with conscious sedation including aspiration, arrhythmia, respiratory failure and death. Alternatives to treatment were discussed, questions were answered. Patient is willing to proceed.  TEE - Dr. Oval Linsey 11/13/17 @ 930am. NPO after midnight. Meds with sips.   Leanor Kail, PA-C 11/12/2017 2:45 PM

## 2017-11-12 NOTE — Progress Notes (Signed)
ANTICOAGULATION CONSULT NOTE - Follow Up Consult  Pharmacy Consult for Coumadin Indication: carotid thrombus  Allergies  Allergen Reactions  . Food Itching and Swelling    Melons  . Penicillins Other (See Comments)    Spots and made hair fall out    Patient Measurements: Height: 5\' 3"  (160 cm) Weight: 165 lb (74.8 kg) IBW/kg (Calculated) : 52.4  Vital Signs: Temp: 98.7 F (37.1 C) (09/24 1221) Temp Source: Oral (09/24 1221) BP: 136/92 (09/24 1643) Pulse Rate: 69 (09/24 1221)  Labs: Recent Labs    11/10/17 2125  11/10/17 2129 11/11/17 0024 11/11/17 1111  11/12/17 0219 11/12/17 0838 11/12/17 1417  HGB 8.2*  --  6.0* 5.6* 8.6*  --  8.6*  --   --   HCT 24.0*  --  23.2* 22.2* 30.3*  --  29.6*  --   --   PLT  --    < > PLATELET CLUMPS NOTED ON SMEAR, UNABLE TO ESTIMATE 1,301* 1,170*  --  1,081*  --   --   APTT  --   --  26  --   --   --   --   --   --   LABPROT  --   --  14.7  --   --   --   --   --   --   INR  --   --  1.16  --   --   --   --   --   --   HEPARINUNFRC  --   --   --   --  0.17*   < > 0.55 0.46 0.53  CREATININE 0.90  --  0.96  --  0.76  --   --   --   --    < > = values in this interval not displayed.    Estimated Creatinine Clearance: 87.9 mL/min (by C-G formula based on SCr of 0.76 mg/dL).   Medications:  Medications Prior to Admission  Medication Sig Dispense Refill Last Dose  . EPINEPHrine 0.3 mg/0.3 mL IJ SOAJ injection Inject 0.3 mLs (0.3 mg total) into the muscle once. (Patient not taking: Reported on 11/10/2017) 1 Device 1 Not Taking at Unknown time  . predniSONE (STERAPRED UNI-PAK 21 TAB) 10 MG (21) TBPK tablet Take by mouth daily. Take as directed. (Patient not taking: Reported on 11/10/2017) 21 tablet 0 Completed Course at Unknown time    Assessment: 42 yo F admitted with L sided weakness, numbness, and R vision lost.  Pt found to have an embolic infarct secondary to carotid thrombus and was started on IV heparin.  Pharmacy asked to initiate  Coumadin dosing.  Baseline INR 1.16, Coumadin points = 6.  Goal of Therapy:  INR 2-3 Monitor platelets by anticoagulation protocol: Yes   Plan:  Coumadin 7.5mg  PO x 1 tonight Daily INR Coumadin education materials ordered  [employer], Pharm.D., BCPS Clinical Pharmacist Pager: [phone] Clinical phone for 11/12/2017 is [phone].  **Pharmacist phone directory can now be found on [email or web address] ([email or web address]).  Listed under [place].  11/12/2017 6:43 PM

## 2017-11-13 ENCOUNTER — Inpatient Hospital Stay (HOSPITAL_COMMUNITY): Payer: Medicaid Other

## 2017-11-13 ENCOUNTER — Encounter (HOSPITAL_COMMUNITY)
Admission: EM | Disposition: A | Discharge status: Home Health | Payer: Self-pay | Source: Home / Self Care | Attending: Internal Medicine

## 2017-11-13 ENCOUNTER — Encounter (HOSPITAL_COMMUNITY): Payer: Self-pay | Admitting: Cardiovascular Disease

## 2017-11-13 DIAGNOSIS — I639 Cerebral infarction, unspecified: Secondary | ICD-10-CM | POA: Diagnosis not present

## 2017-11-13 DIAGNOSIS — I6340 Cerebral infarction due to embolism of unspecified cerebral artery: Secondary | ICD-10-CM | POA: Diagnosis not present

## 2017-11-13 DIAGNOSIS — I340 Nonrheumatic mitral (valve) insufficiency: Secondary | ICD-10-CM

## 2017-11-13 DIAGNOSIS — R29818 Other symptoms and signs involving the nervous system: Secondary | ICD-10-CM | POA: Diagnosis not present

## 2017-11-13 HISTORY — PX: TEE WITHOUT CARDIOVERSION: SHX5443

## 2017-11-13 LAB — ANTINUCLEAR ANTIBODIES, IFA: ANTINUCLEAR ANTIBODIES, IFA: NEGATIVE

## 2017-11-13 LAB — HEPARIN LEVEL (UNFRACTIONATED)
HEPARIN UNFRACTIONATED: 0.17 [IU]/mL — AB (ref 0.30–0.70)
Heparin Unfractionated: <0.1 IU/mL — ABNORMAL LOW (ref 0.30–0.70)

## 2017-11-13 LAB — HOMOCYSTEINE: Homocysteine: 35.3 umol/L — ABNORMAL HIGH (ref 0.0–15.0)

## 2017-11-13 LAB — CBC
HEMATOCRIT: 30.1 % — AB (ref 36.0–46.0)
HEMOGLOBIN: 8.5 g/dL — AB (ref 12.0–15.0)
MCH: 19 pg — ABNORMAL LOW (ref 26.0–34.0)
MCHC: 28.2 g/dL — ABNORMAL LOW (ref 30.0–36.0)
MCV: 67.3 fL — ABNORMAL LOW (ref 78.0–100.0)
Platelets: 1048 10*3/uL (ref 150–400)
RBC: 4.47 MIL/uL (ref 3.87–5.11)
RDW: 33.2 % — AB (ref 11.5–15.5)
WBC: 16.8 10*3/uL — AB (ref 4.0–10.5)

## 2017-11-13 LAB — PROTIME-INR
INR: 1.1
PROTHROMBIN TIME: 14.1 s (ref 11.4–15.2)

## 2017-11-13 LAB — BETA-2-GLYCOPROTEIN I ABS, IGG/M/A
Beta-2 Glyco I IgG: <9 GPI IgG units (ref 0–20)
Beta-2-Glycoprotein I IgM: <9 GPI IgM units (ref 0–32)

## 2017-11-13 LAB — CARDIOLIPIN ANTIBODIES, IGG, IGM, IGA
ANTICARDIOLIPIN IGM: 10 [MPL'U]/mL (ref 0–12)
Anticardiolipin IgG: <9 GPL U/mL (ref 0–14)

## 2017-11-13 LAB — ANTI-DNA ANTIBODY, DOUBLE-STRANDED: ds DNA Ab: 1 IU/mL (ref 0–9)

## 2017-11-13 SURGERY — ECHOCARDIOGRAM, TRANSESOPHAGEAL
Anesthesia: Moderate Sedation

## 2017-11-13 MED ORDER — ENOXAPARIN SODIUM 80 MG/0.8ML ~~LOC~~ SOLN
75.0000 mg | Freq: Two times a day (BID) | SUBCUTANEOUS | Status: DC
  Administered 2017-11-14 (×2): 75 mg via SUBCUTANEOUS
  Filled 2017-11-13 (×2): qty 0.8

## 2017-11-13 MED ORDER — SODIUM CHLORIDE BACTERIOSTATIC 0.9 % IJ SOLN
INTRAMUSCULAR | Status: DC | PRN
  Administered 2017-11-13: 9 mL via INTRAVENOUS

## 2017-11-13 MED ORDER — ENOXAPARIN SODIUM 80 MG/0.8ML ~~LOC~~ SOLN
75.0000 mg | Freq: Once | SUBCUTANEOUS | Status: AC
  Administered 2017-11-13: 75 mg via SUBCUTANEOUS
  Filled 2017-11-13: qty 0.8

## 2017-11-13 MED ORDER — MEGESTROL ACETATE 40 MG PO TABS: 40.0000 mg | ORAL_TABLET | Freq: Two times a day (BID) | ORAL | 0 refills | Status: DC

## 2017-11-13 MED ORDER — VITAMIN B-6 50 MG PO TABS
50.0000 mg | ORAL_TABLET | Freq: Every day | ORAL | Status: DC
  Administered 2017-11-13 – 2017-11-14 (×2): 50 mg via ORAL
  Filled 2017-11-13 (×2): qty 1

## 2017-11-13 MED ORDER — DIPHENHYDRAMINE HCL 50 MG/ML IJ SOLN
INTRAMUSCULAR | Status: DC | PRN
  Administered 2017-11-13: 25 mg via INTRAVENOUS

## 2017-11-13 MED ORDER — FOLIC ACID 1 MG PO TABS
2.0000 mg | ORAL_TABLET | Freq: Every day | ORAL | Status: DC
  Administered 2017-11-13 – 2017-11-14 (×2): 2 mg via ORAL
  Filled 2017-11-13: qty 2

## 2017-11-13 MED ORDER — CYANOCOBALAMIN 1000 MCG/ML IJ SOLN
1000.0000 ug | Freq: Every day | INTRAMUSCULAR | Status: DC
  Administered 2017-11-13 – 2017-11-14 (×2): 1000 ug via SUBCUTANEOUS
  Filled 2017-11-13 (×2): qty 1

## 2017-11-13 MED ORDER — WARFARIN SODIUM 7.5 MG PO TABS
7.5000 mg | ORAL_TABLET | Freq: Once | ORAL | Status: AC
  Administered 2017-11-13: 7.5 mg via ORAL
  Filled 2017-11-13: qty 1

## 2017-11-13 MED ORDER — FERROUS SULFATE 325 (65 FE) MG PO TABS: 325.0000 mg | ORAL_TABLET | Freq: Two times a day (BID) | ORAL | 0 refills | Status: DC

## 2017-11-13 MED ORDER — MEGESTROL ACETATE 40 MG PO TABS
40.0000 mg | ORAL_TABLET | Freq: Two times a day (BID) | ORAL | Status: DC
  Administered 2017-11-13 – 2017-11-14 (×3): 40 mg via ORAL
  Filled 2017-11-13 (×3): qty 1

## 2017-11-13 MED ORDER — DIPHENHYDRAMINE HCL 50 MG/ML IJ SOLN
INTRAMUSCULAR | Status: AC
  Filled 2017-11-13: qty 1

## 2017-11-13 MED ORDER — WARFARIN SODIUM 7.5 MG PO TABS: 7.5000 mg | ORAL_TABLET | Freq: Once | ORAL | 0 refills | Status: DC

## 2017-11-13 MED ORDER — BUTAMBEN-TETRACAINE-BENZOCAINE 2-2-14 % EX AERO
INHALATION_SPRAY | CUTANEOUS | Status: DC | PRN
  Administered 2017-11-13: 2 via TOPICAL

## 2017-11-13 MED ORDER — FOLIC ACID 1 MG PO TABS: 2.0000 mg | ORAL_TABLET | Freq: Every day | ORAL | 0 refills | Status: DC

## 2017-11-13 MED ORDER — FENTANYL CITRATE (PF) 100 MCG/2ML IJ SOLN
INTRAMUSCULAR | Status: AC
  Filled 2017-11-13: qty 2

## 2017-11-13 MED ORDER — MIDAZOLAM HCL 5 MG/ML IJ SOLN
INTRAMUSCULAR | Status: AC
  Filled 2017-11-13: qty 2

## 2017-11-13 MED ORDER — FENTANYL CITRATE (PF) 100 MCG/2ML IJ SOLN
INTRAMUSCULAR | Status: DC | PRN
  Administered 2017-11-13 (×3): 25 ug via INTRAVENOUS

## 2017-11-13 MED ORDER — MIDAZOLAM HCL 10 MG/2ML IJ SOLN
INTRAMUSCULAR | Status: DC | PRN
  Administered 2017-11-13: 2 mg via INTRAVENOUS
  Administered 2017-11-13 (×2): 1 mg via INTRAVENOUS
  Administered 2017-11-13: 2 mg via INTRAVENOUS

## 2017-11-13 MED ORDER — ENOXAPARIN SODIUM 80 MG/0.8ML ~~LOC~~ SOLN: 75.0000 mg | Freq: Two times a day (BID) | SUBCUTANEOUS | 0 refills | Status: DC

## 2017-11-13 NOTE — H&P (Signed)
Mallory Robinson is a 42 y.o. female who has presented today for surgery, with the diagnosis of stroke. The various methods of treatment have been discussed with the patient and family. After consideration of risks, benefits and other options for treatment, the patient has consented to Procedure(s): TRANSESOPHAGEAL ECHOCARDIOGRAM (TEE) (N/A) as a surgical intervention . The patient's history has been reviewed, patient examined, no change in status, stable for surgery. I have reviewed the patient's chart and labs. Questions were answered to the patient's satisfaction.   Indianna Boran C. Oval Linsey, MD, [facility]  11/13/2017 9:30 AM

## 2017-11-13 NOTE — Progress Notes (Signed)
Physical Therapy Treatment Patient Details Name: Mallory Robinson MRN: [id number] DOB: 1975-09-28 Today's Date: 11/13/2017    History of Present Illness Pt is a 42 y.o. admitted 11/10/17 with L-side weakness, vision changes and fall. 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 PMH includes bipolar disorder, anemia, depression.   PT Comments    Pt progressing well with mobility. Able to amb 150' with RW and supervision for safety; continues to demonstrate L-side inattention, but improving awareness of this with intermittent cues. Educ on importance to scanning to L-side, fall risk reduction/environment set-up, and close supervision from family during mobility. Pt now plans to d/c to mother's home who will be available for 24/7 support; has 1 flight of steps with L-rail to enter. Will plan for additional stair training next session.    Follow Up Recommendations  Home health PT;Supervision/Assistance - 24 hour     Equipment Recommendations  Rolling walker with 5" wheels;3in1 (PT);Other (comment)(tub bench)    Recommendations for Other Services       Precautions / Restrictions Precautions Precautions: Fall Precaution Comments: Decreased LLE sensation; L side inattention Restrictions Weight Bearing Restrictions: No    Mobility  Bed Mobility Overal bed mobility: Modified Independent             General bed mobility comments: Increased effort; able to sit towards L-side of bed  Transfers Overall transfer level: Needs assistance Equipment used: Rolling walker (2 wheeled) Transfers: Sit to/from Stand Sit to Stand: Supervision            Ambulation/Gait Ambulation/Gait assistance: Supervision Gait Distance (Feet): 150 Feet Assistive device: Rolling walker (2 wheeled) Gait Pattern/deviations: Step-to pattern;Decreased weight shift to left;Drifts  right/left;Decreased dorsiflexion - left Gait velocity: Decreased Gait velocity interpretation: <1.8 ft/sec, indicate of risk for recurrent falls General Gait Details: Improved amb with RW; supervision for safety. Pt able to self-correct if runs into object on L-side; able to scan better with intermittent cues. Very slowed gait speed, able to slightly increase with cues   Stairs Stairs: Yes   Stair Management: Sideways;One rail Left Number of Stairs: 4 General stair comments: Simulated ascending steps by standing sideways with BUE support on L-side rail and high marching as if going up steps; pt with appropriate clearance of LLE. Will progress next session   Wheelchair Mobility    Modified Rankin (Stroke Patients Only) Modified Rankin (Stroke Patients Only) Pre-Morbid Rankin Score: No significant disability Modified Rankin: Moderately severe disability     Balance Overall balance assessment: Needs assistance   Sitting balance-Leahy Scale: Good       Standing balance-Leahy Scale: Fair Standing balance comment: Can perform ADL tasks at sink without UE support                            Cognition Arousal/Alertness: Awake/alert Behavior During Therapy: WFL for tasks assessed/performed Overall Cognitive Status: Impaired/Different from baseline Area of Impairment: Problem solving;Safety/judgement                         Safety/Judgement: Decreased awareness of deficits   Problem Solving: Requires verbal cues;Difficulty sequencing General Comments: Pt demonstrates awareness of L-sided deficits and L-side inattention. Continues to intermittently run into objects on L-side, but able to correct with instruction on scanning environment. Her ability to do this has already improved since yesterday's session.  Exercises      General Comments General comments (skin integrity, edema, etc.): Wife present during session      Pertinent Vitals/Pain Pain  Assessment: No/denies pain    Home Living                      Prior Function            PT Goals (current goals can now be found in the care plan section) Acute Rehab PT Goals Patient Stated Goal: Return home tomorrow; get back to work eventually PT Goal Formulation: With patient Time For Goal Achievement: 11/26/17 Potential to Achieve Goals: Good Progress towards PT goals: Progressing toward goals    Frequency    Min 4X/week      PT Plan Current plan remains appropriate    Co-evaluation              AM-PAC PT "6 Clicks" Daily Activity  Outcome Measure  Difficulty turning over in bed (including adjusting bedclothes, sheets and blankets)?: None Difficulty moving from lying on back to sitting on the side of the bed? : None Difficulty sitting down on and standing up from a chair with arms (e.g., wheelchair, bedside commode, etc,.)?: A Little Help needed moving to and from a bed to chair (including a wheelchair)?: A Little Help needed walking in hospital room?: A Little Help needed climbing 3-5 steps with a railing? : A Little 6 Click Score: 20    End of Session Equipment Utilized During Treatment: Gait belt Activity Tolerance: Patient tolerated treatment well Patient left: in chair;with call bell/phone within reach;with chair alarm set Nurse Communication: Mobility status PT Visit Diagnosis: Other abnormalities of gait and mobility (R26.89);Other symptoms and signs involving the nervous system (R29.898)     Time: 0488-8916 PT Time Calculation (min) (ACUTE ONLY): 39 min  Charges:  $Gait Training: 8-22 mins $Neuromuscular Re-education: 8-22 mins $Self Care/Home Management: 8-22                    Mabeline Caras, PT, DPT Acute Rehabilitation Services  Pager [phone] Office [place] 11/13/2017, 3:40 PM

## 2017-11-13 NOTE — CV Procedure (Signed)
Brief TEE Note:  LVEF 60-65% Trivial TR, PR, and AR Mild MR No LA/LAA thrombus or mass No ASD/PFO by color flow Doppler or bubble study Trivial pericardial effusion   During this procedure the patient is administered a total of Diphenhydramine 25mg , Versed 6 mg and Fentanyl 75 mcg to achieve and maintain moderate conscious sedation.  The patient's heart rate, blood pressure, and oxygen saturation are monitored continuously during the procedure. The period of conscious sedation is 22 minutes, of which I was present face-to-face 100% of this time.  For additional details see full report.  Rollie Hynek C. Oval Linsey, MD, [facility] 11/13/2017 10:01 AM

## 2017-11-13 NOTE — Progress Notes (Signed)
PT Cancellation Note  Patient Details Name: Mallory Robinson MRN: [id number] DOB: 1975-08-04   Cancelled Treatment:    Reason Eval/Treat Not Completed: Patient at procedure or test/unavailable (TEE). Will follow-up for PT treatment as schedule permits.  Mabeline Caras, PT, DPT Acute Rehabilitation Services  Pager [phone] Office [place] 11/13/2017, 10:26 AM

## 2017-11-13 NOTE — Progress Notes (Signed)
PROGRESS NOTE        PATIENT DETAILS Name: Mallory Robinson Age: 42 y.o. Sex: female Date of Birth: 1975-02-26 Admit Date: 11/10/2017 Admitting Physician Vilma Prader, MD JKD:[id number], Christean Grief, MD  Brief Narrative: Patient is a 42 y.o. female who claims to have prior history of hypertension (claims she was taken off antihypertensives a few years back), long-standing history of menorrhagia, fibroids presented with left-sided weakness, found to have acute CVA-however upon further evaluation found to have severe microcytic anemia with hemoglobin of 6.0.  See below for further details  Subjective:   No major issue overnight-continues to have some amount of left-sided deficits.   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  Continue statin slight left-sided deficits that is essentially unchanged since admission.  TEE negative for any embolic source.  A1c 5.6, LDL 5.  Started on IV heparin on admission-subsequently started on Coumadin.  Spoke with Dr. Austin Miles MD-recommends a Lovenox bridge until INR therapeutic.  Neurology will follow in the outpatient setting and repeat a CTA neck in 2 3 months-if carotid thrombus resolves-no longer will require anticoagulation and can be transitioned to aspirin.  Severe microcytic anemia: Secondary to iron deficiency in the setting of chronic blood loss due to menorrhagia.  Claims to have a known history of fibroids.  Per patient, her menstrual period last close to 7 days, on day 3 it is typically heavy-patient usues to close to 5 pads, and apparently has to change her clothes twice.  Received 1 dose of IV iron a few days back, and 2 units of PRBC transfusion on admission.  Hemoglobin currently stable.  Will place on oral iron supplementation on discharge.  She will need outpatient follow-up with GYN.   Uterine  fibroids: Has a known history of fibroids-this was confirmed on transvaginal ultrasound this admission.  Spoke with Dr. Nehemiah Settle (on 9/24) and Dr Si Raider (on 9/25) recommendations are for outpatient follow-up-and to continue Megace.   Vitamin B12 deficiency: Found to have low vitamin B12 levels as well-being supplemented at this point.  Some concern for absorption issues in the gut as patient has both vitamin B12 and iron deficiency.  Stable for further work-up to be pursued in the outpatient setting.    Thrombocytosis: Reactive in the setting of iron deficiency, slowly improving with iron supplementation-suspect this should continue to improve-however if in the next few months-continues to have persistent thrombocytosis-we will require hematology evaluation and evaluation for genetic mutations related to essential thrombocythemia  Bipolar disorder: Currently not on any mood stabilizing agents at this point.  DVT Prophylaxis: IV heparin  Code Status: Full code   Family Communication: None at bedside  Disposition Plan: Remain inpatient-home most likely tomorrow with home health services  Antimicrobial agents: Anti-infectives (From admission, onward)   None      Procedures: None  CONSULTS:  neurology  Time spent: 25 minutes-Greater than 50% of this time was spent in counseling, explanation of diagnosis, planning of further management, and coordination of care.  MEDICATIONS: Scheduled Meds: .  stroke: mapping our early stages of recovery book   Does not apply Once  . sodium chloride   Intravenous Once  . cyanocobalamin  1,000 mcg Subcutaneous Daily  . [START ON 11/14/2017] enoxaparin (  LOVENOX) injection  75 mg Subcutaneous Q12H  . folic acid  2 mg Oral Daily  . megestrol  40 mg Oral BID  . sodium chloride flush  3 mL Intravenous Q12H  . warfarin  7.5 mg Oral ONCE-1800  . Warfarin - Pharmacist Dosing Inpatient   Does not apply q1800   Continuous Infusions: . ferumoxytol  Stopped (11/11/17 2000)   PRN Meds:.acetaminophen, iopamidol   PHYSICAL EXAM: Vital signs: Vitals:   11/13/17 0955 11/13/17 1004 11/13/17 1014 11/13/17 1025  BP: (!) 187/108 (!) 182/105 (!) 188/101 (!) 180/103  Pulse: 61 67 65 (!) 58  Resp: (!) 28 (!) 35 (!) 30 19  Temp:  99.5 F (37.5 C)    TempSrc:  Oral    SpO2: 100% 100% 100% 100%  Weight:      Height:       Filed Weights   11/10/17 2127 11/11/17 0829  Weight: 66.8 kg 74.8 kg   Body mass index is 29.23 kg/m.   General appearance:Awake, alert, not in any distress.  Eyes:no scleral icterus. HEENT: Atraumatic and Normocephalic Neck: supple, no JVD. Resp:Good air entry bilaterally,no rales or rhonchi CVS: S1 S2 regular, no murmurs.  GI: Bowel sounds present, Non tender and not distended with no gaurding, rigidity or rebound. Extremities: B/L Lower Ext shows no edema, both legs are warm to touch Neurology: Mild left-sided deficits continues-unchanged Psychiatric: Normal judgment and insight. Normal mood. Musculoskeletal:No digital cyanosis Skin:No Rash, warm and dry Wounds:N/A  I have personally reviewed following labs and imaging studies  LABORATORY DATA: CBC: Recent Labs  Lab 11/10/17 2129 11/11/17 0024 11/11/17 1111 11/12/17 0219 11/13/17 0645  WBC 16.8* 16.4* 17.5* 15.4* 16.8*  NEUTROABS 14.1*  --   --   --   --   HGB 6.0* 5.6* 8.6* 8.6* 8.5*  HCT 23.2* 22.2* 30.3* 29.6* 30.1*  MCV 61.9* 61.2* 66.7* 66.1* 67.3*  PLT PLATELET CLUMPS NOTED ON SMEAR, UNABLE TO ESTIMATE 1,301* 1,170* 1,081* 1,048*    Basic Metabolic Panel: Recent Labs  Lab 11/10/17 2125 11/10/17 2129 11/11/17 1111  NA 139 136 139  K 4.0 3.9 3.6  CL 104 104 106  CO2  --  22 23  GLUCOSE 117* 120* 101*  BUN 7 7 <5*  CREATININE 0.90 0.96 0.76  CALCIUM  --  9.1 9.0    GFR: Estimated Creatinine Clearance: 87.9 mL/min (by C-G formula based on SCr of 0.76 mg/dL).  Liver Function Tests: Recent Labs  Lab 11/10/17 2129  AST 34    ALT 20  ALKPHOS 63  BILITOT 0.6  PROT 7.7  ALBUMIN 4.2   No results for input(s): LIPASE, AMYLASE in the last 168 hours. No results for input(s): AMMONIA in the last 168 hours.  Coagulation Profile: Recent Labs  Lab 11/10/17 2129 11/13/17 0408  INR 1.16 1.10    Cardiac Enzymes: No results for input(s): CKTOTAL, CKMB, CKMBINDEX, TROPONINI in the last 168 hours.  BNP (last 3 results) No results for input(s): PROBNP in the last 8760 hours.  HbA1C: Recent Labs    11/12/17 0219  HGBA1C 5.6    CBG: Recent Labs  Lab 11/10/17 2118  GLUCAP 121*    Lipid Profile: Recent Labs    11/12/17 0219  CHOL 52  HDL 28*  LDLCALC 5  TRIG 96  CHOLHDL 1.9    Thyroid Function Tests: No results for input(s): TSH, T4TOTAL, FREET4, T3FREE, THYROIDAB in the last 72 hours.  Anemia Panel: Recent Labs    11/11/17 0024  VITAMINB12 259  FERRITIN 3*  TIBC 587*  IRON 9*  RETICCTPCT 1.7    Urine analysis:    Component Value Date/Time   COLORURINE YELLOW 11/11/2017 0235   APPEARANCEUR CLEAR 11/11/2017 0235   LABSPEC 1.038 (H) 11/11/2017 0235   PHURINE 8.0 11/11/2017 0235   GLUCOSEU NEGATIVE 11/11/2017 0235   HGBUR LARGE (A) 11/11/2017 0235   BILIRUBINUR NEGATIVE 11/11/2017 0235   KETONESUR NEGATIVE 11/11/2017 0235   PROTEINUR NEGATIVE 11/11/2017 0235   UROBILINOGEN 0.2 07/28/2012 1838   NITRITE NEGATIVE 11/11/2017 0235   LEUKOCYTESUR NEGATIVE 11/11/2017 0235    Sepsis Labs: Lactic Acid, Venous No results found for: LATICACIDVEN  MICROBIOLOGY: No results found for this or any previous visit (from the past 240 hour(s)).  RADIOLOGY STUDIES/RESULTS: Ct Angio Head W Or Wo Contrast  Result Date: 11/11/2017 CLINICAL DATA:  42 y/o F; change in neuro exam. Stroke for follow-up. EXAM: CT ANGIOGRAPHY HEAD AND NECK CT PERFUSION BRAIN TECHNIQUE: Multidetector CT imaging of the head and neck was performed using the standard protocol during bolus administration of intravenous  contrast. Multiplanar CT image reconstructions and MIPs were obtained to evaluate the vascular anatomy. Carotid stenosis measurements (when applicable) are obtained utilizing NASCET criteria, using the distal internal carotid diameter as the denominator. Multiphase CT imaging of the brain was performed following IV bolus contrast injection. Subsequent parametric perfusion maps were calculated using RAPID software. CONTRAST:  34mL ISOVUE-370 IOPAMIDOL (ISOVUE-370) INJECTION 76% 90 cc Isovue 370 COMPARISON:  None. 11/10/2017 CT angiogram of the head and neck. FINDINGS: CT HEAD FINDINGS Brain: No evidence of acute infarction, hemorrhage, hydrocephalus, extra-axial collection or mass lesion/mass effect. Vascular: No hyperdense vessel or unexpected calcification. Skull: Normal. Negative for fracture or focal lesion. Sinuses/Orbits: No acute finding. Other: None. ASPECTS ([place] Stroke Program Early CT Score) - Ganglionic level infarction (caudate, lentiform nuclei, internal capsule, insula, M1-M3 cortex): 7 - Supraganglionic infarction (M4-M6 cortex): 3 Total score (0-10 with 10 being normal): 10 Review of the MIP images confirms the above findings CTA NECK FINDINGS Aortic arch: Bovine variant branching. Imaged portion shows no evidence of aneurysm or dissection. No significant stenosis of the major arch vessel origins. Right carotid system: No evidence of dissection, stenosis (50% or greater) or occlusion. Focal posterior wall thickening of the left proximal ICA is less pronounced on the current study than on the prior CT angiogram of the neck (series 9, image 73). Left carotid system: No evidence of dissection, stenosis (50% or greater) or occlusion. Vertebral arteries: Codominant. No evidence of dissection, stenosis (50% or greater) or occlusion. Skeleton: No acute osseous abnormality. Other neck: Negative. Upper chest: Negative. Review of the MIP images confirms the above findings CTA HEAD FINDINGS Anterior  circulation: No significant stenosis, proximal occlusion, aneurysm, or vascular malformation. Posterior circulation: No significant stenosis, proximal occlusion, aneurysm, or vascular malformation. Venous sinuses: As permitted by contrast timing, patent. Anatomic variants: None significant. Dominant right A1 and mildly larger right ICA likely due to increased flow when compared with the smaller left A1 and ICA. Review of the MIP images confirms the above findings CT Brain Perfusion Findings: CBF (<30%) Volume: 28mL Perfusion (Tmax>6.0s) volume: 40mL Mismatch Volume: 26mL Infarction Location:Not identified. There are a few foci of T-max greater than 4 seconds in the right MCA distribution. IMPRESSION: 1. Patent carotid and vertebral arteries. No dissection, aneurysm, or hemodynamically significant stenosis utilizing NASCET criteria. 2. Focal posterior wall thickening of the right proximal ICA is less pronounced on the current study than on the prior  study. Findings may represent a soft plaque rupture. Additionally, given neck being held in flexion on the prior study, this finding may have represented a vessel kink. 3. Stable patent anterior and posterior intracranial circulation. No large vessel occlusion, aneurysm, or significant stenosis. 4. Stable negative noncontrast CT of the head. 5. No acute stroke or ischemic penumbra by CT perfusion criteria. There are a few foci of T-max greater than 4 seconds in the right MCA distribution which may reflect small vessel ischemic changes. These results were called by telephone at the time of interpretation on 11/11/2017 at 3:30 am to Dr. Roland Rack , who verbally acknowledged these results. Electronically Signed   By: Kristine Garbe M.D.   On: 11/11/2017 03:29   Ct Angio Head W Or Wo Contrast  Result Date: 11/10/2017 CLINICAL DATA:  Acute onset lower extremity weakness, fell to RIGHT. Vision changes. EXAM: CT ANGIOGRAPHY HEAD AND NECK TECHNIQUE:  Multidetector CT imaging of the head and neck was performed using the standard protocol during bolus administration of intravenous contrast. Multiplanar CT image reconstructions and MIPs were obtained to evaluate the vascular anatomy. Carotid stenosis measurements (when applicable) are obtained utilizing NASCET criteria, using the distal internal carotid diameter as the denominator. COMPARISON:  CT HEAD November 10, 2017 at 9:33 p.m. FINDINGS: CTA NECK FINDINGS: AORTIC ARCH: Normal appearance of the thoracic arch, 2 vessel arch is a normal variant. The origins of the innominate, left Common carotid artery and subclavian artery are widely patent. RIGHT CAROTID SYSTEM: Common carotid artery is patent. Shelf-like filling defect RIGHT carotid bulb without hemodynamically significant stenosis by NASCET criteria. LEFT CAROTID SYSTEM: Common carotid artery is patent. Normal appearance of the carotid bifurcation without hemodynamically significant stenosis by NASCET criteria. Normal appearance of the internal carotid artery. VERTEBRAL ARTERIES:Codominant vertebral arteries. Normal appearance of the vertebral arteries, widely patent. SKELETON: No acute osseous process though bone windows have not been submitted. OTHER NECK: Soft tissues of the neck are nonacute though, not tailored for evaluation. UPPER CHEST: Included lung apices are clear. No superior mediastinal lymphadenopathy. CTA HEAD FINDINGS: ANTERIOR CIRCULATION: Patent cervical internal carotid arteries, petrous, cavernous and supra clinoid internal carotid arteries. Patent anterior communicating artery. Patent anterior and middle cerebral arteries, mild luminal irregularity. Developmentally smaller LEFT MCA. No large vessel occlusion, significant stenosis, contrast extravasation or aneurysm. POSTERIOR CIRCULATION: Patent vertebral arteries, vertebrobasilar junction and basilar artery, as well as main branch vessels. Patent posterior cerebral arteries mild luminal  irregularity. No large vessel occlusion, significant stenosis, contrast extravasation or aneurysm. VENOUS SINUSES: Major dural venous sinuses are patent though not tailored for evaluation on this angiographic examination. ANATOMIC VARIANTS: None. DELAYED PHASE: Not performed. MIP images reviewed. IMPRESSION: CTA NECK: 1. RIGHT carotid bulb intramural hematoma, less likely atypical fibromuscular dysplasia. No flow-limiting stenosis. 2. Patent vertebral arteries. CTA HEAD: 1.  No emergent large vessel occlusion or flow-limiting stenosis. 2. Mild intracranial atherosclerosis, less likely vasculopathy. Critical Value/emergent results were called by telephone at the time of interpretation on 11/10/2017 at 9:45 pm to Dr. Roland Rack , who verbally acknowledged these results. Electronically Signed   By: Elon Alas M.D.   On: 11/10/2017 21:51   Ct Angio Neck W Or Wo Contrast  Result Date: 11/11/2017 CLINICAL DATA:  42 y/o F; change in neuro exam. Stroke for follow-up. EXAM: CT ANGIOGRAPHY HEAD AND NECK CT PERFUSION BRAIN TECHNIQUE: Multidetector CT imaging of the head and neck was performed using the standard protocol during bolus administration of intravenous contrast. Multiplanar CT image reconstructions  and MIPs were obtained to evaluate the vascular anatomy. Carotid stenosis measurements (when applicable) are obtained utilizing NASCET criteria, using the distal internal carotid diameter as the denominator. Multiphase CT imaging of the brain was performed following IV bolus contrast injection. Subsequent parametric perfusion maps were calculated using RAPID software. CONTRAST:  40mL ISOVUE-370 IOPAMIDOL (ISOVUE-370) INJECTION 76% 90 cc Isovue 370 COMPARISON:  None. 11/10/2017 CT angiogram of the head and neck. FINDINGS: CT HEAD FINDINGS Brain: No evidence of acute infarction, hemorrhage, hydrocephalus, extra-axial collection or mass lesion/mass effect. Vascular: No hyperdense vessel or unexpected  calcification. Skull: Normal. Negative for fracture or focal lesion. Sinuses/Orbits: No acute finding. Other: None. ASPECTS ([place] Stroke Program Early CT Score) - Ganglionic level infarction (caudate, lentiform nuclei, internal capsule, insula, M1-M3 cortex): 7 - Supraganglionic infarction (M4-M6 cortex): 3 Total score (0-10 with 10 being normal): 10 Review of the MIP images confirms the above findings CTA NECK FINDINGS Aortic arch: Bovine variant branching. Imaged portion shows no evidence of aneurysm or dissection. No significant stenosis of the major arch vessel origins. Right carotid system: No evidence of dissection, stenosis (50% or greater) or occlusion. Focal posterior wall thickening of the left proximal ICA is less pronounced on the current study than on the prior CT angiogram of the neck (series 9, image 73). Left carotid system: No evidence of dissection, stenosis (50% or greater) or occlusion. Vertebral arteries: Codominant. No evidence of dissection, stenosis (50% or greater) or occlusion. Skeleton: No acute osseous abnormality. Other neck: Negative. Upper chest: Negative. Review of the MIP images confirms the above findings CTA HEAD FINDINGS Anterior circulation: No significant stenosis, proximal occlusion, aneurysm, or vascular malformation. Posterior circulation: No significant stenosis, proximal occlusion, aneurysm, or vascular malformation. Venous sinuses: As permitted by contrast timing, patent. Anatomic variants: None significant. Dominant right A1 and mildly larger right ICA likely due to increased flow when compared with the smaller left A1 and ICA. Review of the MIP images confirms the above findings CT Brain Perfusion Findings: CBF (<30%) Volume: 6mL Perfusion (Tmax>6.0s) volume: 60mL Mismatch Volume: 72mL Infarction Location:Not identified. There are a few foci of T-max greater than 4 seconds in the right MCA distribution. IMPRESSION: 1. Patent carotid and vertebral arteries. No  dissection, aneurysm, or hemodynamically significant stenosis utilizing NASCET criteria. 2. Focal posterior wall thickening of the right proximal ICA is less pronounced on the current study than on the prior study. Findings may represent a soft plaque rupture. Additionally, given neck being held in flexion on the prior study, this finding may have represented a vessel kink. 3. Stable patent anterior and posterior intracranial circulation. No large vessel occlusion, aneurysm, or significant stenosis. 4. Stable negative noncontrast CT of the head. 5. No acute stroke or ischemic penumbra by CT perfusion criteria. There are a few foci of T-max greater than 4 seconds in the right MCA distribution which may reflect small vessel ischemic changes. These results were called by telephone at the time of interpretation on 11/11/2017 at 3:30 am to Dr. Roland Rack , who verbally acknowledged these results. Electronically Signed   By: Kristine Garbe M.D.   On: 11/11/2017 03:29   Ct Angio Neck W Or Wo Contrast  Result Date: 11/10/2017 CLINICAL DATA:  Acute onset lower extremity weakness, fell to RIGHT. Vision changes. EXAM: CT ANGIOGRAPHY HEAD AND NECK TECHNIQUE: Multidetector CT imaging of the head and neck was performed using the standard protocol during bolus administration of intravenous contrast. Multiplanar CT image reconstructions and MIPs were obtained to evaluate the  vascular anatomy. Carotid stenosis measurements (when applicable) are obtained utilizing NASCET criteria, using the distal internal carotid diameter as the denominator. COMPARISON:  CT HEAD November 10, 2017 at 9:33 p.m. FINDINGS: CTA NECK FINDINGS: AORTIC ARCH: Normal appearance of the thoracic arch, 2 vessel arch is a normal variant. The origins of the innominate, left Common carotid artery and subclavian artery are widely patent. RIGHT CAROTID SYSTEM: Common carotid artery is patent. Shelf-like filling defect RIGHT carotid bulb without  hemodynamically significant stenosis by NASCET criteria. LEFT CAROTID SYSTEM: Common carotid artery is patent. Normal appearance of the carotid bifurcation without hemodynamically significant stenosis by NASCET criteria. Normal appearance of the internal carotid artery. VERTEBRAL ARTERIES:Codominant vertebral arteries. Normal appearance of the vertebral arteries, widely patent. SKELETON: No acute osseous process though bone windows have not been submitted. OTHER NECK: Soft tissues of the neck are nonacute though, not tailored for evaluation. UPPER CHEST: Included lung apices are clear. No superior mediastinal lymphadenopathy. CTA HEAD FINDINGS: ANTERIOR CIRCULATION: Patent cervical internal carotid arteries, petrous, cavernous and supra clinoid internal carotid arteries. Patent anterior communicating artery. Patent anterior and middle cerebral arteries, mild luminal irregularity. Developmentally smaller LEFT MCA. No large vessel occlusion, significant stenosis, contrast extravasation or aneurysm. POSTERIOR CIRCULATION: Patent vertebral arteries, vertebrobasilar junction and basilar artery, as well as main branch vessels. Patent posterior cerebral arteries mild luminal irregularity. No large vessel occlusion, significant stenosis, contrast extravasation or aneurysm. VENOUS SINUSES: Major dural venous sinuses are patent though not tailored for evaluation on this angiographic examination. ANATOMIC VARIANTS: None. DELAYED PHASE: Not performed. MIP images reviewed. IMPRESSION: CTA NECK: 1. RIGHT carotid bulb intramural hematoma, less likely atypical fibromuscular dysplasia. No flow-limiting stenosis. 2. Patent vertebral arteries. CTA HEAD: 1.  No emergent large vessel occlusion or flow-limiting stenosis. 2. Mild intracranial atherosclerosis, less likely vasculopathy. Critical Value/emergent results were called by telephone at the time of interpretation on 11/10/2017 at 9:45 pm to Dr. Roland Rack , who verbally  acknowledged these results. Electronically Signed   By: Elon Alas M.D.   On: 11/10/2017 21:51   Mr Brain Wo Contrast  Result Date: 11/11/2017 CLINICAL DATA:  42 y/o F; change in neuro exam, evaluation for stroke. Follow-up of leg numbness and headache. EXAM: MRI HEAD WITHOUT CONTRAST TECHNIQUE: Multiplanar, multiecho pulse sequences of the brain and surrounding structures were obtained without intravenous contrast. COMPARISON:  11/11/2017 CT head, CTA head, CT perfusion head. FINDINGS: Brain: There several scattered subcentimeter foci of reduced diffusion throughout the right frontal and parietal cortices, right temporoparietal junction, right frontal periventricular white matter, and the right caudate nucleus compatible with acute/early subacute infarction. No associated hemorrhage or mass effect. No extra-axial collection, hydrocephalus, focal mass effect of the brain, effacement of basilar cisterns, or herniation. Vascular: Normal flow voids. Skull and upper cervical spine: Normal marrow signal. Sinuses/Orbits: Negative. Other: None. 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 No associated hemorrhage or mass effect. These results were called by telephone at the time of interpretation on 11/11/2017 at 5:41 am to Dr. Leonel Ramsay, who verbally acknowledged these results. Electronically Signed   By: Kristine Garbe M.D.   On: 11/11/2017 05:45   Ct Cerebral Perfusion W Contrast  Result Date: 11/11/2017 CLINICAL DATA:  42 y/o F; change in neuro exam. Stroke for follow-up. EXAM: CT ANGIOGRAPHY HEAD AND NECK CT PERFUSION BRAIN TECHNIQUE: Multidetector CT imaging of the head and neck was performed using the standard protocol during bolus administration of intravenous contrast. Multiplanar CT image reconstructions  and MIPs were obtained to evaluate the vascular anatomy. Carotid stenosis measurements (when applicable) are  obtained utilizing NASCET criteria, using the distal internal carotid diameter as the denominator. Multiphase CT imaging of the brain was performed following IV bolus contrast injection. Subsequent parametric perfusion maps were calculated using RAPID software. CONTRAST:  66mL ISOVUE-370 IOPAMIDOL (ISOVUE-370) INJECTION 76% 90 cc Isovue 370 COMPARISON:  None. 11/10/2017 CT angiogram of the head and neck. FINDINGS: CT HEAD FINDINGS Brain: No evidence of acute infarction, hemorrhage, hydrocephalus, extra-axial collection or mass lesion/mass effect. Vascular: No hyperdense vessel or unexpected calcification. Skull: Normal. Negative for fracture or focal lesion. Sinuses/Orbits: No acute finding. Other: None. ASPECTS ([place] Stroke Program Early CT Score) - Ganglionic level infarction (caudate, lentiform nuclei, internal capsule, insula, M1-M3 cortex): 7 - Supraganglionic infarction (M4-M6 cortex): 3 Total score (0-10 with 10 being normal): 10 Review of the MIP images confirms the above findings CTA NECK FINDINGS Aortic arch: Bovine variant branching. Imaged portion shows no evidence of aneurysm or dissection. No significant stenosis of the major arch vessel origins. Right carotid system: No evidence of dissection, stenosis (50% or greater) or occlusion. Focal posterior wall thickening of the left proximal ICA is less pronounced on the current study than on the prior CT angiogram of the neck (series 9, image 73). Left carotid system: No evidence of dissection, stenosis (50% or greater) or occlusion. Vertebral arteries: Codominant. No evidence of dissection, stenosis (50% or greater) or occlusion. Skeleton: No acute osseous abnormality. Other neck: Negative. Upper chest: Negative. Review of the MIP images confirms the above findings CTA HEAD FINDINGS Anterior circulation: No significant stenosis, proximal occlusion, aneurysm, or vascular malformation. Posterior circulation: No significant stenosis, proximal occlusion,  aneurysm, or vascular malformation. Venous sinuses: As permitted by contrast timing, patent. Anatomic variants: None significant. Dominant right A1 and mildly larger right ICA likely due to increased flow when compared with the smaller left A1 and ICA. Review of the MIP images confirms the above findings CT Brain Perfusion Findings: CBF (<30%) Volume: 59mL Perfusion (Tmax>6.0s) volume: 72mL Mismatch Volume: 9mL Infarction Location:Not identified. There are a few foci of T-max greater than 4 seconds in the right MCA distribution. IMPRESSION: 1. Patent carotid and vertebral arteries. No dissection, aneurysm, or hemodynamically significant stenosis utilizing NASCET criteria. 2. Focal posterior wall thickening of the right proximal ICA is less pronounced on the current study than on the prior study. Findings may represent a soft plaque rupture. Additionally, given neck being held in flexion on the prior study, this finding may have represented a vessel kink. 3. Stable patent anterior and posterior intracranial circulation. No large vessel occlusion, aneurysm, or significant stenosis. 4. Stable negative noncontrast CT of the head. 5. No acute stroke or ischemic penumbra by CT perfusion criteria. There are a few foci of T-max greater than 4 seconds in the right MCA distribution which may reflect small vessel ischemic changes. These results were called by telephone at the time of interpretation on 11/11/2017 at 3:30 am to Dr. Roland Rack , who verbally acknowledged these results. Electronically Signed   By: Kristine Garbe M.D.   On: 11/11/2017 03:29   US Pelvic Complete With Transvaginal  Result Date: 11/12/2017 CLINICAL DATA:  Menorrhagia EXAM: TRANSABDOMINAL AND TRANSVAGINAL ULTRASOUND OF PELVIS TECHNIQUE: Both transabdominal and transvaginal ultrasound examinations of the pelvis were performed. Transabdominal technique was performed for global imaging of the pelvis including uterus, ovaries, adnexal  regions, and pelvic cul-de-sac. It was necessary to proceed with endovaginal exam following the transabdominal  exam to visualize the endometrium and ovaries. COMPARISON:  None FINDINGS: Uterus Measurements: 8.8 x 5.1 x 6.5 cm. Several fibroids are identified, one in the posterior uterine body, intramural in appearance measuring 2 x 1.6 x 2.4 cm, the second in anteriorly and predominantly intramural but slightly subserosal measuring 1.3 x 1.1 x 1.4 cm, and a third is anterior and right-sided measuring 2 x 1.3 x 1.8 cm and subserosal. Endometrium Thickness: 3.9 mm.  No focal abnormality visualized. Right ovary Measurements: 4 x 2 x 2.2 cm.  Normal appearance/no adnexal mass. Left ovary Measurements: 3.8 x 1.9 x 2.7 cm. Normal appearance/no adnexal mass. Other findings No abnormal free fluid. IMPRESSION: Leiomyomas of the uterus. Thin endometrial lining without intraluminal abnormality. No adnexal mass. Electronically Signed   By: Ashley Royalty M.D.   On: 11/12/2017 21:29   Ct Head Code Stroke Wo Contrast  Result Date: 11/10/2017 CLINICAL DATA:  Code stroke. Acute onset lower extremity weakness, felt to RIGHT. RIGHT vision changes. History of TIA. EXAM: CT HEAD WITHOUT CONTRAST TECHNIQUE: Contiguous axial images were obtained from the base of the skull through the vertex without intravenous contrast. COMPARISON:  None. FINDINGS: BRAIN: No intraparenchymal hemorrhage, mass effect nor midline shift. The ventricles and sulci are normal. No acute large vascular territory infarcts. No abnormal extra-axial fluid collections. 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 Basal cisterns are patent. VASCULAR: Unremarkable. SKULL/SOFT TISSUES: No skull fracture. No significant soft tissue swelling. ORBITS/SINUSES: The included ocular globes and orbital contents are normal.Trace paranasal sinus mucosal thickening. Mastoid air cells are well aerated. OTHER: None. ASPECTS  [facility] Stroke Program Early CT Score) - Ganglionic level infarction (caudate, lentiform nuclei, internal capsule, insula, M1-M3 cortex): 7 - Supraganglionic infarction (M4-M6 cortex): 3 Total score (0-10 with 10 being normal): 10 IMPRESSION: 1. Negative non-contrast CT HEAD. 2. ASPECTS is 10. 3. Critical Value/emergent results text paged to [place], Neurology via AMION secure system on 11/10/2017 at 9:37 pm, including interpreting physician's phone number. Electronically Signed   By: Elon Alas M.D.   On: 11/10/2017 21:38     LOS: 2 days   Oren Binet, MD  [facility]  If 7PM-7AM, please contact night-coverage  Please page via [email or web address]-click on MD name and type text message  11/13/2017, 3:01 PM

## 2017-11-13 NOTE — Progress Notes (Signed)
ANTICOAGULATION CONSULT NOTE - Follow Up  Pharmacy Consult for Heparin to Lovenox Indication: carotid thrombus  Heparin Dosing Weight: 66.8 kg  Labs: Recent Labs    11/10/17 2125 11/10/17 2129  11/11/17 1111  11/12/17 0219  11/12/17 1417 11/12/17 2146 11/13/17 0408 11/13/17 0645  HGB 8.2* 6.0*   < > 8.6*  --  8.6*  --   --   --   --  8.5*  HCT 24.0* 23.2*   < > 30.3*  --  29.6*  --   --   --   --  30.1*  PLT  --  PLATELET CLUMPS NOTED ON SMEAR, UNABLE TO ESTIMATE   < > 1,170*  --  1,081*  --   --   --   --  1,048*  APTT  --  26  --   --   --   --   --   --   --   --   --   LABPROT  --  14.7  --   --   --   --   --   --   --  14.1  --   INR  --  1.16  --   --   --   --   --   --   --  1.10  --   HEPARINUNFRC  --   --   --  0.17*   < > 0.55   < > 0.53 0.31 <0.10*  --   CREATININE 0.90 0.96  --  0.76  --   --   --   --   --   --   --    < > = values in this interval not displayed.    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

## 2017-11-13 NOTE — Plan of Care (Signed)
Pt homocysteine level came back showed 35.3, mildly elevated. She is already on Folic acid, and X78 supplement, recommend to continue. Also will add VitB6 50mg  daily. She will follow up in stroke clinic in [facility] for monitoring homocysteine level.   Rosalin Hawking, MD PhD Stroke Neurology 11/13/2017 4:04 PM

## 2017-11-13 NOTE — Progress Notes (Signed)
  Echocardiogram Echocardiogram Transesophageal has been performed.  Mallory Robinson T Mallory Robinson 11/13/2017, 10:12 AM

## 2017-11-13 NOTE — Progress Notes (Signed)
ANTICOAGULATION CONSULT NOTE - Follow Up Consult  Pharmacy Consult for heparin Indication: stroke  Labs: Recent Labs    11/10/17 2125  11/10/17 2129 11/11/17 0024 11/11/17 1111  11/12/17 0219  11/12/17 1417 11/12/17 2146 11/13/17 0408  HGB 8.2*  --  6.0* 5.6* 8.6*  --  8.6*  --   --   --   --   HCT 24.0*  --  23.2* 22.2* 30.3*  --  29.6*  --   --   --   --   PLT  --    < > PLATELET CLUMPS NOTED ON SMEAR, UNABLE TO ESTIMATE 1,301* 1,170*  --  1,081*  --   --   --   --   APTT  --   --  26  --   --   --   --   --   --   --   --   LABPROT  --   --  14.7  --   --   --   --   --   --   --  14.1  INR  --   --  1.16  --   --   --   --   --   --   --  1.10  HEPARINUNFRC  --   --   --   --  0.17*   < > 0.55   < > 0.53 0.31 <0.10*  CREATININE 0.90  --  0.96  --  0.76  --   --   --   --   --   --    < > = values in this interval not displayed.    Assessment: 42yo female now subtherapeutic on heparin after one level at goal; RN reports no gtt issues overnight.  Goal of Therapy:  Heparin level 0.3-0.5 units/ml   Plan:  Will increase heparin gtt by 1-2 units/kg/hr to 1150 units/hr and check level in 6 hours.    Wynona Neat, PharmD, BCPS  11/13/2017,6:56 AM

## 2017-11-13 NOTE — Progress Notes (Signed)
STROKE TEAM PROGRESS NOTE   SUBJECTIVE (INTERVAL HISTORY) Patient back from endo suite. TEE done unremarkable. Stroke etiology still unclear. Continue anticoagulation and transition to po coumadin. Pt still has mild weakness at left foot and sensation deficit at LLE. PT/OT recommend home health PT/OT.     OBJECTIVE Temp:  [98.7 F (37.1 C)-99.7 F (37.6 C)] 99.5 F (37.5 C) (09/25 1004) Pulse Rate:  [54-69] 58 (09/25 1025) Cardiac Rhythm: Sinus bradycardia (09/25 0403) Resp:  [15-35] 19 (09/25 1025) BP: (136-200)/(92-111) 180/103 (09/25 1025) SpO2:  [100 %] 100 % (09/25 1025)  Recent Labs  Lab 11/10/17 2118  GLUCAP 121*   Recent Labs  Lab 11/10/17 2125 11/10/17 2129 11/11/17 1111  NA 139 136 139  K 4.0 3.9 3.6  CL 104 104 106  CO2  --  22 23  GLUCOSE 117* 120* 101*  BUN 7 7 <5*  CREATININE 0.90 0.96 0.76  CALCIUM  --  9.1 9.0   Recent Labs  Lab 11/10/17 2129  AST 34  ALT 20  ALKPHOS 63  BILITOT 0.6  PROT 7.7  ALBUMIN 4.2   Recent Labs  Lab 11/10/17 2129 11/11/17 0024 11/11/17 1111 11/12/17 0219 11/13/17 0645  WBC 16.8* 16.4* 17.5* 15.4* 16.8*  NEUTROABS 14.1*  --   --   --   --   HGB 6.0* 5.6* 8.6* 8.6* 8.5*  HCT 23.2* 22.2* 30.3* 29.6* 30.1*  MCV 61.9* 61.2* 66.7* 66.1* 67.3*  PLT PLATELET CLUMPS NOTED ON SMEAR, UNABLE TO ESTIMATE 1,301* 1,170* 1,081* 1,048*   No results for input(s): CKTOTAL, CKMB, CKMBINDEX, TROPONINI in the last 168 hours. Recent Labs    11/10/17 2129 11/13/17 0408  LABPROT 14.7 14.1  INR 1.16 1.10   Recent Labs    11/11/17 0235  COLORURINE YELLOW  LABSPEC 1.038*  PHURINE 8.0  GLUCOSEU NEGATIVE  HGBUR LARGE*  BILIRUBINUR NEGATIVE  KETONESUR NEGATIVE  PROTEINUR NEGATIVE  NITRITE NEGATIVE  LEUKOCYTESUR NEGATIVE       Component Value Date/Time   CHOL 52 11/12/2017 0219   TRIG 96 11/12/2017 0219   HDL 28 (L) 11/12/2017 0219   CHOLHDL 1.9 11/12/2017 0219   VLDL 19 11/12/2017 0219   LDLCALC 5 11/12/2017 0219    Lab Results  Component Value Date   HGBA1C 5.6 11/12/2017      Component Value Date/Time   LABOPIA NONE DETECTED 11/11/2017 0235   COCAINSCRNUR NONE DETECTED 11/11/2017 0235   LABBENZ NONE DETECTED 11/11/2017 0235   AMPHETMU NONE DETECTED 11/11/2017 0235   THCU NONE DETECTED 11/11/2017 0235   LABBARB NONE DETECTED 11/11/2017 0235    Recent Labs  Lab 11/10/17 2126  ETH <10    I have personally reviewed the radiological images below and agree with the radiology interpretations.  Ct Angio Head W Or Wo Contrast  Result Date: 11/11/2017 CLINICAL DATA:  42 y/o F; change in neuro exam. Stroke for follow-up. EXAM: CT ANGIOGRAPHY HEAD AND NECK CT PERFUSION BRAIN TECHNIQUE: Multidetector CT imaging of the head and neck was performed using the standard protocol during bolus administration of intravenous contrast. Multiplanar CT image reconstructions and MIPs were obtained to evaluate the vascular anatomy. Carotid stenosis measurements (when applicable) are obtained utilizing NASCET criteria, using the distal internal carotid diameter as the denominator. Multiphase CT imaging of the brain was performed following IV bolus contrast injection. Subsequent parametric perfusion maps were calculated using RAPID software. CONTRAST:  45m ISOVUE-370 IOPAMIDOL (ISOVUE-370) INJECTION 76% 90 cc Isovue 370 COMPARISON:  None. 11/10/2017 CT angiogram  of the head and neck. FINDINGS: CT HEAD FINDINGS Brain: No evidence of acute infarction, hemorrhage, hydrocephalus, extra-axial collection or mass lesion/mass effect. Vascular: No hyperdense vessel or unexpected calcification. Skull: Normal. Negative for fracture or focal lesion. Sinuses/Orbits: No acute finding. Other: None. ASPECTS ([place] Stroke Program Early CT Score) - Ganglionic level infarction (caudate, lentiform nuclei, internal capsule, insula, M1-M3 cortex): 7 - Supraganglionic infarction (M4-M6 cortex): 3 Total score (0-10 with 10 being normal): 10 Review  of the MIP images confirms the above findings CTA NECK FINDINGS Aortic arch: Bovine variant branching. Imaged portion shows no evidence of aneurysm or dissection. No significant stenosis of the major arch vessel origins. Right carotid system: No evidence of dissection, stenosis (50% or greater) or occlusion. Focal posterior wall thickening of the left proximal ICA is less pronounced on the current study than on the prior CT angiogram of the neck (series 9, image 73). Left carotid system: No evidence of dissection, stenosis (50% or greater) or occlusion. Vertebral arteries: Codominant. No evidence of dissection, stenosis (50% or greater) or occlusion. Skeleton: No acute osseous abnormality. Other neck: Negative. Upper chest: Negative. Review of the MIP images confirms the above findings CTA HEAD FINDINGS Anterior circulation: No significant stenosis, proximal occlusion, aneurysm, or vascular malformation. Posterior circulation: No significant stenosis, proximal occlusion, aneurysm, or vascular malformation. Venous sinuses: As permitted by contrast timing, patent. Anatomic variants: None significant. Dominant right A1 and mildly larger right ICA likely due to increased flow when compared with the smaller left A1 and ICA. Review of the MIP images confirms the above findings CT Brain Perfusion Findings: CBF (<30%) Volume: 28m Perfusion (Tmax>6.0s) volume: 065mMismatch Volume: 32m109mnfarction Location:Not identified. There are a few foci of T-max greater than 4 seconds in the right MCA distribution. IMPRESSION: 1. Patent carotid and vertebral arteries. No dissection, aneurysm, or hemodynamically significant stenosis utilizing NASCET criteria. 2. Focal posterior wall thickening of the right proximal ICA is less pronounced on the current study than on the prior study. Findings may represent a soft plaque rupture. Additionally, given neck being held in flexion on the prior study, this finding may have represented a vessel  kink. 3. Stable patent anterior and posterior intracranial circulation. No large vessel occlusion, aneurysm, or significant stenosis. 4. Stable negative noncontrast CT of the head. 5. No acute stroke or ischemic penumbra by CT perfusion criteria. There are a few foci of T-max greater than 4 seconds in the right MCA distribution which may reflect small vessel ischemic changes. These results were called by telephone at the time of interpretation on 11/11/2017 at 3:30 am to Dr. MCNRoland Rackwho verbally acknowledged these results. Electronically Signed   By: LanKristine GarbeD.   On: 11/11/2017 03:29   Ct Angio Head W Or Wo Contrast  Result Date: 11/10/2017 CLINICAL DATA:  Acute onset lower extremity weakness, fell to RIGHT. Vision changes. EXAM: CT ANGIOGRAPHY HEAD AND NECK TECHNIQUE: Multidetector CT imaging of the head and neck was performed using the standard protocol during bolus administration of intravenous contrast. Multiplanar CT image reconstructions and MIPs were obtained to evaluate the vascular anatomy. Carotid stenosis measurements (when applicable) are obtained utilizing NASCET criteria, using the distal internal carotid diameter as the denominator. COMPARISON:  CT HEAD November 10, 2017 at 9:33 p.m. FINDINGS: CTA NECK FINDINGS: AORTIC ARCH: Normal appearance of the thoracic arch, 2 vessel arch is a normal variant. The origins of the innominate, left Common carotid artery and subclavian artery are widely patent. RIGHT CAROTID SYSTEM:  Common carotid artery is patent. Shelf-like filling defect RIGHT carotid bulb without hemodynamically significant stenosis by NASCET criteria. LEFT CAROTID SYSTEM: Common carotid artery is patent. Normal appearance of the carotid bifurcation without hemodynamically significant stenosis by NASCET criteria. Normal appearance of the internal carotid artery. VERTEBRAL ARTERIES:Codominant vertebral arteries. Normal appearance of the vertebral arteries,  widely patent. SKELETON: No acute osseous process though bone windows have not been submitted. OTHER NECK: Soft tissues of the neck are nonacute though, not tailored for evaluation. UPPER CHEST: Included lung apices are clear. No superior mediastinal lymphadenopathy. CTA HEAD FINDINGS: ANTERIOR CIRCULATION: Patent cervical internal carotid arteries, petrous, cavernous and supra clinoid internal carotid arteries. Patent anterior communicating artery. Patent anterior and middle cerebral arteries, mild luminal irregularity. Developmentally smaller LEFT MCA. No large vessel occlusion, significant stenosis, contrast extravasation or aneurysm. POSTERIOR CIRCULATION: Patent vertebral arteries, vertebrobasilar junction and basilar artery, as well as main branch vessels. Patent posterior cerebral arteries mild luminal irregularity. No large vessel occlusion, significant stenosis, contrast extravasation or aneurysm. VENOUS SINUSES: Major dural venous sinuses are patent though not tailored for evaluation on this angiographic examination. ANATOMIC VARIANTS: None. DELAYED PHASE: Not performed. MIP images reviewed. IMPRESSION: CTA NECK: 1. RIGHT carotid bulb intramural hematoma, less likely atypical fibromuscular dysplasia. No flow-limiting stenosis. 2. Patent vertebral arteries. CTA HEAD: 1.  No emergent large vessel occlusion or flow-limiting stenosis. 2. Mild intracranial atherosclerosis, less likely vasculopathy. Critical Value/emergent results were called by telephone at the time of interpretation on 11/10/2017 at 9:45 pm to Dr. Roland Rack , who verbally acknowledged these results. Electronically Signed   By: Elon Alas M.D.   On: 11/10/2017 21:51   Ct Angio Neck W Or Wo Contrast  Result Date: 11/11/2017 CLINICAL DATA:  42 y/o F; change in neuro exam. Stroke for follow-up. EXAM: CT ANGIOGRAPHY HEAD AND NECK CT PERFUSION BRAIN TECHNIQUE: Multidetector CT imaging of the head and neck was performed using the  standard protocol during bolus administration of intravenous contrast. Multiplanar CT image reconstructions and MIPs were obtained to evaluate the vascular anatomy. Carotid stenosis measurements (when applicable) are obtained utilizing NASCET criteria, using the distal internal carotid diameter as the denominator. Multiphase CT imaging of the brain was performed following IV bolus contrast injection. Subsequent parametric perfusion maps were calculated using RAPID software. CONTRAST:  37m ISOVUE-370 IOPAMIDOL (ISOVUE-370) INJECTION 76% 90 cc Isovue 370 COMPARISON:  None. 11/10/2017 CT angiogram of the head and neck. FINDINGS: CT HEAD FINDINGS Brain: No evidence of acute infarction, hemorrhage, hydrocephalus, extra-axial collection or mass lesion/mass effect. Vascular: No hyperdense vessel or unexpected calcification. Skull: Normal. Negative for fracture or focal lesion. Sinuses/Orbits: No acute finding. Other: None. ASPECTS ([place] Score) - Ganglionic level infarction (caudate, lentiform nuclei, internal capsule, insula, M1-M3 cortex): 7 - Supraganglionic infarction (M4-M6 cortex): 3 Total score (0-10 with 10 being normal): 10 Review of the MIP images confirms the above findings CTA NECK FINDINGS Aortic arch: Bovine variant branching. Imaged portion shows no evidence of aneurysm or dissection. No significant stenosis of the major arch vessel origins. Right carotid system: No evidence of dissection, stenosis (50% or greater) or occlusion. Focal posterior wall thickening of the left proximal ICA is less pronounced on the current study than on the prior CT angiogram of the neck (series 9, image 73). Left carotid system: No evidence of dissection, stenosis (50% or greater) or occlusion. Vertebral arteries: Codominant. No evidence of dissection, stenosis (50% or greater) or occlusion. Skeleton: No acute osseous abnormality. Other neck: Negative.  Upper chest: Negative. Review of the MIP images  confirms the above findings CTA HEAD FINDINGS Anterior circulation: No significant stenosis, proximal occlusion, aneurysm, or vascular malformation. Posterior circulation: No significant stenosis, proximal occlusion, aneurysm, or vascular malformation. Venous sinuses: As permitted by contrast timing, patent. Anatomic variants: None significant. Dominant right A1 and mildly larger right ICA likely due to increased flow when compared with the smaller left A1 and ICA. Review of the MIP images confirms the above findings CT Brain Perfusion Findings: CBF (<30%) Volume: 47m Perfusion (Tmax>6.0s) volume: 081mMismatch Volume: 23m27mnfarction Location:Not identified. There are a few foci of T-max greater than 4 seconds in the right MCA distribution. IMPRESSION: 1. Patent carotid and vertebral arteries. No dissection, aneurysm, or hemodynamically significant stenosis utilizing NASCET criteria. 2. Focal posterior wall thickening of the right proximal ICA is less pronounced on the current study than on the prior study. Findings may represent a soft plaque rupture. Additionally, given neck being held in flexion on the prior study, this finding may have represented a vessel kink. 3. Stable patent anterior and posterior intracranial circulation. No large vessel occlusion, aneurysm, or significant stenosis. 4. Stable negative noncontrast CT of the head. 5. No acute stroke or ischemic penumbra by CT perfusion criteria. There are a few foci of T-max greater than 4 seconds in the right MCA distribution which may reflect small vessel ischemic changes. These results were called by telephone at the time of interpretation on 11/11/2017 at 3:30 am to Dr. MCNRoland Rackwho verbally acknowledged these results. Electronically Signed   By: LanKristine GarbeD.   On: 11/11/2017 03:29   Ct Angio Neck W Or Wo Contrast  Result Date: 11/10/2017 CLINICAL DATA:  Acute onset lower extremity weakness, fell to RIGHT. Vision changes.  EXAM: CT ANGIOGRAPHY HEAD AND NECK TECHNIQUE: Multidetector CT imaging of the head and neck was performed using the standard protocol during bolus administration of intravenous contrast. Multiplanar CT image reconstructions and MIPs were obtained to evaluate the vascular anatomy. Carotid stenosis measurements (when applicable) are obtained utilizing NASCET criteria, using the distal internal carotid diameter as the denominator. COMPARISON:  CT HEAD November 10, 2017 at 9:33 p.m. FINDINGS: CTA NECK FINDINGS: AORTIC ARCH: Normal appearance of the thoracic arch, 2 vessel arch is a normal variant. The origins of the innominate, left Common carotid artery and subclavian artery are widely patent. RIGHT CAROTID SYSTEM: Common carotid artery is patent. Shelf-like filling defect RIGHT carotid bulb without hemodynamically significant stenosis by NASCET criteria. LEFT CAROTID SYSTEM: Common carotid artery is patent. Normal appearance of the carotid bifurcation without hemodynamically significant stenosis by NASCET criteria. Normal appearance of the internal carotid artery. VERTEBRAL ARTERIES:Codominant vertebral arteries. Normal appearance of the vertebral arteries, widely patent. SKELETON: No acute osseous process though bone windows have not been submitted. OTHER NECK: Soft tissues of the neck are nonacute though, not tailored for evaluation. UPPER CHEST: Included lung apices are clear. No superior mediastinal lymphadenopathy. CTA HEAD FINDINGS: ANTERIOR CIRCULATION: Patent cervical internal carotid arteries, petrous, cavernous and supra clinoid internal carotid arteries. Patent anterior communicating artery. Patent anterior and middle cerebral arteries, mild luminal irregularity. Developmentally smaller LEFT MCA. No large vessel occlusion, significant stenosis, contrast extravasation or aneurysm. POSTERIOR CIRCULATION: Patent vertebral arteries, vertebrobasilar junction and basilar artery, as well as main branch vessels.  Patent posterior cerebral arteries mild luminal irregularity. No large vessel occlusion, significant stenosis, contrast extravasation or aneurysm. VENOUS SINUSES: Major dural venous sinuses are patent though not tailored for evaluation on this  angiographic examination. ANATOMIC VARIANTS: None. DELAYED PHASE: Not performed. MIP images reviewed. IMPRESSION: CTA NECK: 1. RIGHT carotid bulb intramural hematoma, less likely atypical fibromuscular dysplasia. No flow-limiting stenosis. 2. Patent vertebral arteries. CTA HEAD: 1.  No emergent large vessel occlusion or flow-limiting stenosis. 2. Mild intracranial atherosclerosis, less likely vasculopathy. Critical Value/emergent results were called by telephone at the time of interpretation on 11/10/2017 at 9:45 pm to Dr. Roland Rack , who verbally acknowledged these results. Electronically Signed   By: Elon Alas M.D.   On: 11/10/2017 21:51   Mr Brain Wo Contrast  Result Date: 11/11/2017 CLINICAL DATA:  42 y/o F; change in neuro exam, evaluation for stroke. Follow-up of leg numbness and headache. EXAM: MRI HEAD WITHOUT CONTRAST TECHNIQUE: Multiplanar, multiecho pulse sequences of the brain and surrounding structures were obtained without intravenous contrast. COMPARISON:  11/11/2017 CT head, CTA head, CT perfusion head. FINDINGS: Brain: There several scattered subcentimeter foci of reduced diffusion throughout the right frontal and parietal cortices, right temporoparietal junction, right frontal periventricular white matter, and the right caudate nucleus compatible with acute/early subacute infarction. No associated hemorrhage or mass effect. No extra-axial collection, hydrocephalus, focal mass effect of the brain, effacement of basilar cisterns, or herniation. Vascular: Normal flow voids. Skull and upper cervical spine: Normal marrow signal. Sinuses/Orbits: Negative. Other: None. IMPRESSION: Several scattered subcentimeter foci of acute/early subacute  infarction throughout the right MCA distribution. No associated hemorrhage or mass effect. These results were called by telephone at the time of interpretation on 11/11/2017 at 5:41 am to Dr. Leonel Ramsay, who verbally acknowledged these results. Electronically Signed   By: Kristine Garbe M.D.   On: 11/11/2017 05:45   Ct Cerebral Perfusion W Contrast  Result Date: 11/11/2017 CLINICAL DATA:  42 y/o F; change in neuro exam. Stroke for follow-up. EXAM: CT ANGIOGRAPHY HEAD AND NECK CT PERFUSION BRAIN TECHNIQUE: Multidetector CT imaging of the head and neck was performed using the standard protocol during bolus administration of intravenous contrast. Multiplanar CT image reconstructions and MIPs were obtained to evaluate the vascular anatomy. Carotid stenosis measurements (when applicable) are obtained utilizing NASCET criteria, using the distal internal carotid diameter as the denominator. Multiphase CT imaging of the brain was performed following IV bolus contrast injection. Subsequent parametric perfusion maps were calculated using RAPID software. CONTRAST:  13m ISOVUE-370 IOPAMIDOL (ISOVUE-370) INJECTION 76% 90 cc Isovue 370 COMPARISON:  None. 11/10/2017 CT angiogram of the head and neck. FINDINGS: CT HEAD FINDINGS Brain: No evidence of acute infarction, hemorrhage, hydrocephalus, extra-axial collection or mass lesion/mass effect. Vascular: No hyperdense vessel or unexpected calcification. Skull: Normal. Negative for fracture or focal lesion. Sinuses/Orbits: No acute finding. Other: None. ASPECTS ([place] Early [place] Score) - Ganglionic level infarction (caudate, lentiform nuclei, internal capsule, insula, M1-M3 cortex): 7 - Supraganglionic infarction (M4-M6 cortex): 3 Total score (0-10 with 10 being normal): 10 Review of the MIP images confirms the above findings CTA NECK FINDINGS Aortic arch: Bovine variant branching. Imaged portion shows no evidence of aneurysm or dissection. No  significant stenosis of the major arch vessel origins. Right carotid system: No evidence of dissection, stenosis (50% or greater) or occlusion. Focal posterior wall thickening of the left proximal ICA is less pronounced on the current study than on the prior CT angiogram of the neck (series 9, image 73). Left carotid system: No evidence of dissection, stenosis (50% or greater) or occlusion. Vertebral arteries: Codominant. No evidence of dissection, stenosis (50% or greater) or occlusion. Skeleton: No acute osseous abnormality. Other neck:  Negative. Upper chest: Negative. Review of the MIP images confirms the above findings CTA HEAD FINDINGS Anterior circulation: No significant stenosis, proximal occlusion, aneurysm, or vascular malformation. Posterior circulation: No significant stenosis, proximal occlusion, aneurysm, or vascular malformation. Venous sinuses: As permitted by contrast timing, patent. Anatomic variants: None significant. Dominant right A1 and mildly larger right ICA likely due to increased flow when compared with the smaller left A1 and ICA. Review of the MIP images confirms the above findings CT Brain Perfusion Findings: CBF (<30%) Volume: 35m Perfusion (Tmax>6.0s) volume: 012mMismatch Volume: 32m35mnfarction Location:Not identified. There are a few foci of T-max greater than 4 seconds in the right MCA distribution. IMPRESSION: 1. Patent carotid and vertebral arteries. No dissection, aneurysm, or hemodynamically significant stenosis utilizing NASCET criteria. 2. Focal posterior wall thickening of the right proximal ICA is less pronounced on the current study than on the prior study. Findings may represent a soft plaque rupture. Additionally, given neck being held in flexion on the prior study, this finding may have represented a vessel kink. 3. Stable patent anterior and posterior intracranial circulation. No large vessel occlusion, aneurysm, or significant stenosis. 4. Stable negative noncontrast CT  of the head. 5. No acute stroke or ischemic penumbra by CT perfusion criteria. There are a few foci of T-max greater than 4 seconds in the right MCA distribution which may reflect small vessel ischemic changes. These results were called by telephone at the time of interpretation on 11/11/2017 at 3:30 am to Dr. MCNRoland Rackwho verbally acknowledged these results. Electronically Signed   By: LanKristine GarbeD.   On: 11/11/2017 03:29   Ct Head Code Stroke Wo Contrast  Result Date: 11/10/2017 CLINICAL DATA:  Code stroke. Acute onset lower extremity weakness, felt to RIGHT. RIGHT vision changes. History of TIA. EXAM: CT HEAD WITHOUT CONTRAST TECHNIQUE: Contiguous axial images were obtained from the base of the skull through the vertex without intravenous contrast. COMPARISON:  None. FINDINGS: BRAIN: No intraparenchymal hemorrhage, mass effect nor midline shift. The ventricles and sulci are normal. No acute large vascular territory infarcts. No abnormal extra-axial fluid collections. 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 Basal cisterns are patent. VASCULAR: Unremarkable. SKULL/SOFT TISSUES: No skull fracture. No significant soft tissue swelling. ORBITS/SINUSES: The included ocular globes and orbital contents are normal.Trace paranasal sinus mucosal thickening. Mastoid air cells are well aerated. OTHER: None. ASPECTS ([facility] Early CT Score) - Ganglionic level infarction (caudate, lentiform nuclei, internal capsule, insula, M1-M3 cortex): 7 - Supraganglionic infarction (M4-M6 cortex): 3 Total score (0-10 with 10 being normal): 10 IMPRESSION: 1. Negative non-contrast CT HEAD. 2. ASPECTS is 10. 3. Critical Value/emergent results text paged to Dr.Malverneurology via AM[employer] secure system on 11/10/2017 at 9:37 pm, including interpreting physician's phone number. Electronically Signed   By: CouElon AlasD.   On:  11/10/2017 21:38   TTE  - Left ventricle: The cavity size was normal. Systolic function was   normal. The estimated ejection fraction was in the range of 60%   to 65%. Wall motion was normal; there were no regional wall   motion abnormalities. Left ventricular diastolic function   parameters were normal for the patient&'s age.  TEE LVEF 60-65% Trivial TR, PR, and AR Mild MR No LA/LAA thrombus or mass No ASD/PFO by color flow Doppler or bubble study Trivial pericardial effusion   PHYSICAL EXAM  Temp:  [98.7 F (37.1 C)-99.7 F (37.6 C)] 99.5 F (37.5  C) (09/25 1004) Pulse Rate:  [54-69] 58 (09/25 1025) Resp:  [15-35] 19 (09/25 1025) BP: (136-200)/(92-111) 180/103 (09/25 1025) SpO2:  [100 %] 100 % (09/25 1025)  General - Well nourished, well developed, in no apparent distress.  Ophthalmologic - fundi not visualized due to noncooperation.  Cardiovascular - Regular rate and rhythm.  Mental Status -  Level of arousal and orientation to time, place, and person were intact. Language including expression, naming, repetition, comprehension was assessed and found intact. Attention span and concentration were normal. Fund of Knowledge was assessed and was intact.  Cranial Nerves II - XII - II - Visual field intact OS. III, IV, VI - Extraocular movements intact. V - Facial sensation intact bilaterally. VII - Facial movement intact bilaterally. VIII - Hearing & vestibular intact bilaterally. X - Palate elevates symmetrically. XI - Chin turning & shoulder shrug intact bilaterally. XII - Tongue protrusion intact.  Motor Strength - The patient's strength was normal in all extremities and pronator drift was absent.  Bulk was normal and fasciculations were absent.   Motor Tone - Muscle tone was assessed at the neck and appendages and was normal.  Reflexes - The patient's reflexes were symmetrical in all extremities and she had no pathological reflexes.  Sensory - Light touch,  temperature/pinprick were assessed and were decreased sensation on the LLE with 90% at foreleg and 50% at foot compare to right.    Coordination - The patient had normal movements in the hands and feet with no ataxia or dysmetria.  Tremor was absent.  Gait and Station - deferred.   ASSESSMENT/PLAN [name] is a 42 y.o. female with history of TIA, depression, bipolar admitted for left sided weakness, numbness, and right eye vision loss. No tPA given due to outside window.    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  Continue heparin IV bridge, will start Coumadin with INR goal 2-3.  Repeat CTA neck in 2 to 3 months, if carotid thrombus resolved, Coumadin can be discontinued and transition to aspirin.  Patient counseled to be compliant with her antithrombotic medications  Ongoing aggressive stroke risk factor management  Therapy recommendations: Pending  Disposition: Pending  Right ICA/CCA thrombus  Etiology unclear  hypercoagulable work-up pending - so far ESR and CRP negative  TEE unremarkable   LE venous Doppler negative for DVT  Continue heparin IV bridge to coumadin with INR goal 2-3  Severe anemia  Likely due to uterus fibroids  Likely chronic  Hemoglobin 8.2-6.0-5.6-PRBC-8.6-8.6->8.5  Monitor H&H  PRBC transfusion if hemoglobin less than 7  Significant thrombocythemia  Platelet  [id number]  Likely reactive to severe anemia  Although reactive thrombocythemia less likely causing stroke, but still there is a possibility  However, need close monitoring as outpatient.  If anemia corrected, however thrombocythemia persists, need oncology referral for thrombocythemia treatment  Hypertension . Stable on the high side  Long term BP goal normotensive  Other Stroke Risk Factors  Hx stroke/TIA  Other Active Problems  Depression  Bipolar   Hospital day # 2  Neurology will sign off. Please call with questions. Pt will follow up with stroke clinic NP at [facility] in about 4 weeks. Thanks for the consult.   Rosalin Hawking, MD PhD Stroke Neurology 11/13/2017 11:38 AM    To contact Stroke Continuity provider, please refer to [email or web address] After hours, contact General Neurology

## 2017-11-14 ENCOUNTER — Encounter (HOSPITAL_COMMUNITY): Payer: Self-pay | Admitting: Cardiovascular Disease

## 2017-11-14 DIAGNOSIS — N920 Excessive and frequent menstruation with regular cycle: Secondary | ICD-10-CM

## 2017-11-14 LAB — PROTIME-INR
INR: 1.31
PROTHROMBIN TIME: 16.2 s — AB (ref 11.4–15.2)

## 2017-11-14 MED ORDER — ENOXAPARIN (LOVENOX) PATIENT EDUCATION KIT
PACK | Freq: Once | Status: DC
  Filled 2017-11-14: qty 1

## 2017-11-14 MED ORDER — PYRIDOXINE HCL 50 MG PO TABS: 50.0000 mg | ORAL_TABLET | Freq: Every day | ORAL | 0 refills | Status: DC

## 2017-11-14 MED ORDER — COUMADIN BOOK
Freq: Once | Status: DC
  Filled 2017-11-14: qty 1

## 2017-11-14 NOTE — Discharge Summary (Addendum)
PATIENT DETAILS Name: Mallory Robinson Age: 42 y.o. Sex: female Date of Birth: 26-Mar-1975 MRN: [id number]. Admitting Physician: Mallory Prader, MD Mallory Robinson:VHQIONG, Mallory Grief, MD  Admit Date: 11/10/2017 Discharge date: 11/14/2017  Recommendations for Outpatient Follow-up:  1. Follow up with PCP in 1-2 weeks 2. Please obtain BMP/CBC in one week 3. Follow with PCP for INR check in the next 2 to 3 days, stop Lovenox once INR therapeutic 4. Please ensure will follow-up with neurology, GYN 5. If she still has persistent thrombocytosis even after correcting iron deficiency-she will need referral to hematology for further work-up.  Admitted From:  Home  Disposition: Home with home health services  Home Health:  Yes  Equipment/Devices: None  Discharge Condition: Stable  CODE STATUS: FULL CODE  Diet recommendation:  Heart Healthy   Brief Summary: See H&P, Labs, Consult and Test reports for all details in brief,Patient is a 42 y.o. female who claims to have prior history of hypertension (claims she was taken off antihypertensives a few years back), long-standing history of menorrhagia, fibroids presented with left-sided weakness, found to have acute CVA-however upon further evaluation found to have severe microcytic anemia with hemoglobin of 6.0.  See below for further details  Brief Hospital Course: Acute CVA: Felt to be embolic-found to have a thrombus in the right ICA-not sure if this thrombus is not transit or develop due to a plaque rupture.  Her left-sided deficits that is essentially unchanged since admission.  TEE negative for any embolic source.  A1c 5.6, LDL 5.  Started on IV heparin on admission-subsequently started on Coumadin.  Spoke with Mallory. Austin Miles MD-recommends a Lovenox bridge until INR therapeutic.  Neurology will follow in the outpatient setting and repeat a CTA neck in 2 3 months-if carotid thrombus resolves-no longer will require anticoagulation and can be transitioned  to aspirin.  Due to elevated homocystine level she has been started on vitamin B6 as well.  Patient instructed to follow with PCP in the next few days for an INR check, once INR is therapeutic, Lovenox can be discontinued.  Severe microcytic anemia: Secondary to iron deficiency in the setting of chronic blood loss due to menorrhagia.  Claims to have a known history of fibroids.  Per patient, her menstrual period last close to 7 days, on day 3 it is typically heavy-patient usues to close to 5 pads, and apparently has to change her clothes twice.  Received 1 dose of IV iron a few days back, and 2 units of PRBC transfusion on admission.  Hemoglobin currently stable.  Will place on oral iron supplementation on discharge.  She will need outpatient follow-up with GYN.   Uterine fibroids: Has a known history of fibroids-this was confirmed on transvaginal ultrasound this admission.  Spoke with Mallory Robinson (on 9/24) and Mallory Robinson (on 9/25) recommendations are for outpatient follow-up-and to continue Megace.  Vitamin B12 deficiency: Found to have low vitamin B12 levels as well-being supplemented at this point.  Some concern for absorption issues in the gut as patient has both vitamin B12 and iron deficiency.  Stable for further work-up to be pursued in the outpatient setting.    Patient to follow-up with primary care practitioner for vitamin B12 injections-she is aware of this recommendation  Thrombocytosis: Reactive in the setting of iron deficiency, slowly improving with iron supplementation-suspect this should continue to improve-however if in the next few months-continues to have persistent thrombocytosis-we will require hematology evaluation and evaluation for genetic mutations related to essential thrombocythemia  Bipolar disorder: Currently not on any mood stabilizing agents at this point.  Procedures/Studies: 9/25>> TEE  Discharge Diagnoses:  Active Problems:   Neurological deficit present    Cerebral embolism with cerebral infarction   Acute CVA (cerebrovascular accident) [facility])   Discharge Instructions:  Activity:  As tolerated with Full fall precautions use walker/cane & assistance as needed   Discharge Instructions    Ambulatory referral to Neurology   Complete by:  As directed    Follow up with stroke clinic NP (Mallory Robinson or Mallory Robinson, if both not available, consider Mallory Robinson, or Mallory Robinson) at [facility] in about 4 weeks. Thanks.   Call MD for:  extreme fatigue   Complete by:  As directed    Call MD for:  persistant dizziness or light-headedness   Complete by:  As directed    Call MD for:  persistant nausea and vomiting   Complete by:  As directed    Diet - low sodium heart healthy   Complete by:  As directed    Discharge instructions   Complete by:  As directed    Follow with Primary MD  Mallory Ebbs, MD in next 3-4 days for a INR check  Follow with primary GYN for fibroid uterus  Please ask Mallory Mallory Robinson to start Vitamin B12 injections  Please get a complete blood count and chemistry panel checked by your Primary MD at your next visit, and again as instructed by your Primary MD.  Get Medicines reviewed and adjusted: Please take all your medications with you for your next visit with your Primary MD  Laboratory/radiological data: Please request your Primary MD to go over all hospital tests and procedure/radiological results at the follow up, please ask your Primary MD to get all Hospital records sent to his/her office.  In some cases, they will be blood work, cultures and biopsy results pending at the time of your discharge. Please request that your primary care M.D. follows up on these results.  Also Note the following: If you experience worsening of your admission symptoms, develop shortness of breath, life threatening emergency, suicidal or homicidal thoughts you must seek medical attention immediately by calling 911 or calling your MD immediately   if symptoms less severe.  You must read complete instructions/literature along with all the possible adverse reactions/side effects for all the Medicines you take and that have been prescribed to you. Take any new Medicines after you have completely understood and accpet all the possible adverse reactions/side effects.   Do not drive when taking Pain medications or sleeping medications (Benzodaizepines)  Do not take more than prescribed Pain, Sleep and Anxiety Medications. It is not advisable to combine anxiety,sleep and pain medications without talking with your primary care practitioner  Special Instructions: If you have smoked or chewed Tobacco  in the last 2 yrs please stop smoking, stop any regular Alcohol  and or any Recreational drug use.  Wear Seat belts while driving.  Please note: You were cared for by a hospitalist during your hospital stay. Once you are discharged, your primary care physician will handle any further medical issues. Please note that NO REFILLS for any discharge medications will be authorized once you are discharged, as it is imperative that you return to your primary care physician (or establish a relationship with a primary care physician if you do not have one) for your post hospital discharge needs so that they can reassess your need for medications and monitor your lab values.   Increase  activity slowly   Complete by:  As directed      Allergies as of 11/14/2017      Reactions   Food Itching, Swelling   Melons   Penicillins Other (See Comments)   Spots and made hair fall out      Medication List    STOP taking these medications   EPINEPHrine 0.3 mg/0.3 mL Soaj injection Commonly known as:  EPI-PEN   predniSONE 10 MG (21) Tbpk tablet Commonly known as:  STERAPRED UNI-PAK 21 TAB     TAKE these medications   enoxaparin 80 MG/0.8ML injection Commonly known as:  LOVENOX Inject 0.75 mLs (75 mg total) into the skin every 12 (twelve) hours.   ferrous  sulfate 325 (65 FE) MG tablet Take 1 tablet (325 mg total) by mouth 2 (two) times daily with a meal.   folic acid 1 MG tablet Commonly known as:  FOLVITE Take 2 tablets (2 mg total) by mouth daily.   megestrol 40 MG tablet Commonly known as:  MEGACE Take 1 tablet (40 mg total) by mouth 2 (two) times daily.   pyridOXINE 50 MG tablet Commonly known as:  B-6 Take 1 tablet (50 mg total) by mouth daily.   warfarin 7.5 MG tablet Commonly known as:  COUMADIN Take 1 tablet (7.5 mg total) by mouth one time only at 6 PM.            Durable Medical Equipment  (From admission, onward)         Start     Ordered   11/13/17 1520  For home use only DME Shower stool  Once     11/13/17 1519   11/12/17 1603  For home use only DME Walker rolling  Once    Question:  Patient needs a walker to treat with the following condition  Answer:  Weakness   11/12/17 1602   11/12/17 1603  For home use only DME 3 n 1  Once     11/12/17 1602         Follow-up Information    Mallory. Jeanie Robinson. Go on 11/19/2017.   Why:  3:00 pm Contact information: Mallory. [facility] [address] [place], [place] [address] [phone] [place] Follow up.   Specialty:  Home Health Services Why:  home health services arranged (PT,OT,SLP) Contact information: [place] [address] [place] Follow up.   Why:  rolling walker and 3 in 1/bsc and tub bench will be delivered prior to discharge Contact information: [place] [address] [phone]        [name]. Schedule an appointment as soon as possible for a visit in 4 week(s).   Specialty:  Neurology Contact information: [address] [place]. Schedule an appointment as soon as possible for a visit in 2  week(s).   Why:  for fibroid uterus Contact information: [place] [phone]         Allergies  Allergen Reactions  . Food Itching and Swelling    Melons  . Penicillins Other (See Comments)    Spots and made hair fall out    Consultations:   neurology   Other Procedures/Studies: Ct Angio Head W Or  Wo Contrast  Result Date: 11/11/2017 CLINICAL DATA:  42 y/o F; change in neuro exam. Stroke for follow-up. EXAM: CT ANGIOGRAPHY HEAD AND NECK CT PERFUSION BRAIN TECHNIQUE: Multidetector CT imaging of the head and neck was performed using the standard protocol during bolus administration of intravenous contrast. Multiplanar CT image reconstructions and MIPs were obtained to evaluate the vascular anatomy. Carotid stenosis measurements (when applicable) are obtained utilizing NASCET criteria, using the distal internal carotid diameter as the denominator. Multiphase CT imaging of the brain was performed following IV bolus contrast injection. Subsequent parametric perfusion maps were calculated using RAPID software. CONTRAST:  20mL ISOVUE-370 IOPAMIDOL (ISOVUE-370) INJECTION 76% 90 cc Isovue 370 COMPARISON:  None. 11/10/2017 CT angiogram of the head and neck. FINDINGS: CT HEAD FINDINGS Brain: No evidence of acute infarction, hemorrhage, hydrocephalus, extra-axial collection or mass lesion/mass effect. Vascular: No hyperdense vessel or unexpected calcification. Skull: Normal. Negative for fracture or focal lesion. Sinuses/Orbits: No acute finding. Other: None. ASPECTS ([place] Stroke Program Early CT Score) - Ganglionic level infarction (caudate, lentiform nuclei, internal capsule, insula, M1-M3 cortex): 7 - Supraganglionic infarction (M4-M6 cortex): 3 Total score (0-10 with 10 being normal): 10 Review of the MIP images confirms the above findings CTA NECK FINDINGS Aortic arch: Bovine variant branching. Imaged portion shows no evidence of aneurysm or  dissection. No significant stenosis of the major arch vessel origins. Right carotid system: No evidence of dissection, stenosis (50% or greater) or occlusion. Focal posterior wall thickening of the left proximal ICA is less pronounced on the current study than on the prior CT angiogram of the neck (series 9, image 73). Left carotid system: No evidence of dissection, stenosis (50% or greater) or occlusion. Vertebral arteries: Codominant. No evidence of dissection, stenosis (50% or greater) or occlusion. Skeleton: No acute osseous abnormality. Other neck: Negative. Upper chest: Negative. Review of the MIP images confirms the above findings CTA HEAD FINDINGS Anterior circulation: No significant stenosis, proximal occlusion, aneurysm, or vascular malformation. Posterior circulation: No significant stenosis, proximal occlusion, aneurysm, or vascular malformation. Venous sinuses: As permitted by contrast timing, patent. Anatomic variants: None significant. Dominant right A1 and mildly larger right ICA likely due to increased flow when compared with the smaller left A1 and ICA. Review of the MIP images confirms the above findings CT Brain Perfusion Findings: CBF (<30%) Volume: 101mL Perfusion (Tmax>6.0s) volume: 33mL Mismatch Volume: 41mL Infarction Location:Not identified. There are a few foci of T-max greater than 4 seconds in the right MCA distribution. IMPRESSION: 1. Patent carotid and vertebral arteries. No dissection, aneurysm, or hemodynamically significant stenosis utilizing NASCET criteria. 2. Focal posterior wall thickening of the right proximal ICA is less pronounced on the current study than on the prior study. Findings may represent a soft plaque rupture. Additionally, given neck being held in flexion on the prior study, this finding may have represented a vessel kink. 3. Stable patent anterior and posterior intracranial circulation. No large vessel occlusion, aneurysm, or significant stenosis. 4. Stable negative  noncontrast CT of the head. 5. No acute stroke or ischemic penumbra by CT perfusion criteria. There are a few foci of T-max greater than 4 seconds in the right MCA distribution which may reflect small vessel ischemic changes. These results were called by telephone at the time of interpretation on 11/11/2017 at 3:30 am to Mallory. Roland Rack , who verbally acknowledged these results. Electronically Signed   By: Kristine Garbe M.D.   On: 11/11/2017 03:29   Ct Angio Head W Or Wo Contrast  Result  Date: 11/10/2017 CLINICAL DATA:  Acute onset lower extremity weakness, fell to RIGHT. Vision changes. EXAM: CT ANGIOGRAPHY HEAD AND NECK TECHNIQUE: Multidetector CT imaging of the head and neck was performed using the standard protocol during bolus administration of intravenous contrast. Multiplanar CT image reconstructions and MIPs were obtained to evaluate the vascular anatomy. Carotid stenosis measurements (when applicable) are obtained utilizing NASCET criteria, using the distal internal carotid diameter as the denominator. COMPARISON:  CT HEAD November 10, 2017 at 9:33 p.m. FINDINGS: CTA NECK FINDINGS: AORTIC ARCH: Normal appearance of the thoracic arch, 2 vessel arch is a normal variant. The origins of the innominate, left Common carotid artery and subclavian artery are widely patent. RIGHT CAROTID SYSTEM: Common carotid artery is patent. Shelf-like filling defect RIGHT carotid bulb without hemodynamically significant stenosis by NASCET criteria. LEFT CAROTID SYSTEM: Common carotid artery is patent. Normal appearance of the carotid bifurcation without hemodynamically significant stenosis by NASCET criteria. Normal appearance of the internal carotid artery. VERTEBRAL ARTERIES:Codominant vertebral arteries. Normal appearance of the vertebral arteries, widely patent. SKELETON: No acute osseous process though bone windows have not been submitted. OTHER NECK: Soft tissues of the neck are nonacute though, not  tailored for evaluation. UPPER CHEST: Included lung apices are clear. No superior mediastinal lymphadenopathy. CTA HEAD FINDINGS: ANTERIOR CIRCULATION: Patent cervical internal carotid arteries, petrous, cavernous and supra clinoid internal carotid arteries. Patent anterior communicating artery. Patent anterior and middle cerebral arteries, mild luminal irregularity. Developmentally smaller LEFT MCA. No large vessel occlusion, significant stenosis, contrast extravasation or aneurysm. POSTERIOR CIRCULATION: Patent vertebral arteries, vertebrobasilar junction and basilar artery, as well as main branch vessels. Patent posterior cerebral arteries mild luminal irregularity. No large vessel occlusion, significant stenosis, contrast extravasation or aneurysm. VENOUS SINUSES: Major dural venous sinuses are patent though not tailored for evaluation on this angiographic examination. ANATOMIC VARIANTS: None. DELAYED PHASE: Not performed. MIP images reviewed. IMPRESSION: CTA NECK: 1. RIGHT carotid bulb intramural hematoma, less likely atypical fibromuscular dysplasia. No flow-limiting stenosis. 2. Patent vertebral arteries. CTA HEAD: 1.  No emergent large vessel occlusion or flow-limiting stenosis. 2. Mild intracranial atherosclerosis, less likely vasculopathy. Critical Value/emergent results were called by telephone at the time of interpretation on 11/10/2017 at 9:45 pm to Mallory. Roland Rack , who verbally acknowledged these results. Electronically Signed   By: Elon Alas M.D.   On: 11/10/2017 21:51   Ct Angio Neck W Or Wo Contrast  Result Date: 11/11/2017 CLINICAL DATA:  42 y/o F; change in neuro exam. Stroke for follow-up. EXAM: CT ANGIOGRAPHY HEAD AND NECK CT PERFUSION BRAIN TECHNIQUE: Multidetector CT imaging of the head and neck was performed using the standard protocol during bolus administration of intravenous contrast. Multiplanar CT image reconstructions and MIPs were obtained to evaluate the vascular  anatomy. Carotid stenosis measurements (when applicable) are obtained utilizing NASCET criteria, using the distal internal carotid diameter as the denominator. Multiphase CT imaging of the brain was performed following IV bolus contrast injection. Subsequent parametric perfusion maps were calculated using RAPID software. CONTRAST:  51mL ISOVUE-370 IOPAMIDOL (ISOVUE-370) INJECTION 76% 90 cc Isovue 370 COMPARISON:  None. 11/10/2017 CT angiogram of the head and neck. FINDINGS: CT HEAD FINDINGS Brain: No evidence of acute infarction, hemorrhage, hydrocephalus, extra-axial collection or mass lesion/mass effect. Vascular: No hyperdense vessel or unexpected calcification. Skull: Normal. Negative for fracture or focal lesion. Sinuses/Orbits: No acute finding. Other: None. ASPECTS [facility] Stroke Program Early CT Score) - Ganglionic level infarction (caudate, lentiform nuclei, internal capsule, insula, M1-M3 cortex): 7 - Supraganglionic infarction (M4-M6  cortex): 3 Total score (0-10 with 10 being normal): 10 Review of the MIP images confirms the above findings CTA NECK FINDINGS Aortic arch: Bovine variant branching. Imaged portion shows no evidence of aneurysm or dissection. No significant stenosis of the major arch vessel origins. Right carotid system: No evidence of dissection, stenosis (50% or greater) or occlusion. Focal posterior wall thickening of the left proximal ICA is less pronounced on the current study than on the prior CT angiogram of the neck (series 9, image 73). Left carotid system: No evidence of dissection, stenosis (50% or greater) or occlusion. Vertebral arteries: Codominant. No evidence of dissection, stenosis (50% or greater) or occlusion. Skeleton: No acute osseous abnormality. Other neck: Negative. Upper chest: Negative. Review of the MIP images confirms the above findings CTA HEAD FINDINGS Anterior circulation: No significant stenosis, proximal occlusion, aneurysm, or vascular malformation. Posterior  circulation: No significant stenosis, proximal occlusion, aneurysm, or vascular malformation. Venous sinuses: As permitted by contrast timing, patent. Anatomic variants: None significant. Dominant right A1 and mildly larger right ICA likely due to increased flow when compared with the smaller left A1 and ICA. Review of the MIP images confirms the above findings CT Brain Perfusion Findings: CBF (<30%) Volume: 61mL Perfusion (Tmax>6.0s) volume: 16mL Mismatch Volume: 79mL Infarction Location:Not identified. There are a few foci of T-max greater than 4 seconds in the right MCA distribution. IMPRESSION: 1. Patent carotid and vertebral arteries. No dissection, aneurysm, or hemodynamically significant stenosis utilizing NASCET criteria. 2. Focal posterior wall thickening of the right proximal ICA is less pronounced on the current study than on the prior study. Findings may represent a soft plaque rupture. Additionally, given neck being held in flexion on the prior study, this finding may have represented a vessel kink. 3. Stable patent anterior and posterior intracranial circulation. No large vessel occlusion, aneurysm, or significant stenosis. 4. Stable negative noncontrast CT of the head. 5. No acute stroke or ischemic penumbra by CT perfusion criteria. There are a few foci of T-max greater than 4 seconds in the right MCA distribution which may reflect small vessel ischemic changes. These results were called by telephone at the time of interpretation on 11/11/2017 at 3:30 am to Mallory. Roland Rack , who verbally acknowledged these results. Electronically Signed   By: Kristine Garbe M.D.   On: 11/11/2017 03:29   Ct Angio Neck W Or Wo Contrast  Result Date: 11/10/2017 CLINICAL DATA:  Acute onset lower extremity weakness, fell to RIGHT. Vision changes. EXAM: CT ANGIOGRAPHY HEAD AND NECK TECHNIQUE: Multidetector CT imaging of the head and neck was performed using the standard protocol during bolus  administration of intravenous contrast. Multiplanar CT image reconstructions and MIPs were obtained to evaluate the vascular anatomy. Carotid stenosis measurements (when applicable) are obtained utilizing NASCET criteria, using the distal internal carotid diameter as the denominator. COMPARISON:  CT HEAD November 10, 2017 at 9:33 p.m. FINDINGS: CTA NECK FINDINGS: AORTIC ARCH: Normal appearance of the thoracic arch, 2 vessel arch is a normal variant. The origins of the innominate, left Common carotid artery and subclavian artery are widely patent. RIGHT CAROTID SYSTEM: Common carotid artery is patent. Shelf-like filling defect RIGHT carotid bulb without hemodynamically significant stenosis by NASCET criteria. LEFT CAROTID SYSTEM: Common carotid artery is patent. Normal appearance of the carotid bifurcation without hemodynamically significant stenosis by NASCET criteria. Normal appearance of the internal carotid artery. VERTEBRAL ARTERIES:Codominant vertebral arteries. Normal appearance of the vertebral arteries, widely patent. SKELETON: No acute osseous process though bone windows have not  been submitted. OTHER NECK: Soft tissues of the neck are nonacute though, not tailored for evaluation. UPPER CHEST: Included lung apices are clear. No superior mediastinal lymphadenopathy. CTA HEAD FINDINGS: ANTERIOR CIRCULATION: Patent cervical internal carotid arteries, petrous, cavernous and supra clinoid internal carotid arteries. Patent anterior communicating artery. Patent anterior and middle cerebral arteries, mild luminal irregularity. Developmentally smaller LEFT MCA. No large vessel occlusion, significant stenosis, contrast extravasation or aneurysm. POSTERIOR CIRCULATION: Patent vertebral arteries, vertebrobasilar junction and basilar artery, as well as main branch vessels. Patent posterior cerebral arteries mild luminal irregularity. No large vessel occlusion, significant stenosis, contrast extravasation or aneurysm.  VENOUS SINUSES: Major dural venous sinuses are patent though not tailored for evaluation on this angiographic examination. ANATOMIC VARIANTS: None. DELAYED PHASE: Not performed. MIP images reviewed. IMPRESSION: CTA NECK: 1. RIGHT carotid bulb intramural hematoma, less likely atypical fibromuscular dysplasia. No flow-limiting stenosis. 2. Patent vertebral arteries. CTA HEAD: 1.  No emergent large vessel occlusion or flow-limiting stenosis. 2. Mild intracranial atherosclerosis, less likely vasculopathy. Critical Value/emergent results were called by telephone at the time of interpretation on 11/10/2017 at 9:45 pm to Mallory. Roland Rack , who verbally acknowledged these results. Electronically Signed   By: Elon Alas M.D.   On: 11/10/2017 21:51   Mr Brain Wo Contrast  Result Date: 11/11/2017 CLINICAL DATA:  42 y/o F; change in neuro exam, evaluation for stroke. Follow-up of leg numbness and headache. EXAM: MRI HEAD WITHOUT CONTRAST TECHNIQUE: Multiplanar, multiecho pulse sequences of the brain and surrounding structures were obtained without intravenous contrast. COMPARISON:  11/11/2017 CT head, CTA head, CT perfusion head. FINDINGS: Brain: There several scattered subcentimeter foci of reduced diffusion throughout the right frontal and parietal cortices, right temporoparietal junction, right frontal periventricular white matter, and the right caudate nucleus compatible with acute/early subacute infarction. No associated hemorrhage or mass effect. No extra-axial collection, hydrocephalus, focal mass effect of the brain, effacement of basilar cisterns, or herniation. Vascular: Normal flow voids. Skull and upper cervical spine: Normal marrow signal. Sinuses/Orbits: Negative. Other: None. 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 No associated hemorrhage or mass effect. These results were called by telephone at the time of  interpretation on 11/11/2017 at 5:41 am to Mallory. Leonel Ramsay, who verbally acknowledged these results. Electronically Signed   By: Kristine Garbe M.D.   On: 11/11/2017 05:45   Ct Cerebral Perfusion W Contrast  Result Date: 11/11/2017 CLINICAL DATA:  42 y/o F; change in neuro exam. Stroke for follow-up. EXAM: CT ANGIOGRAPHY HEAD AND NECK CT PERFUSION BRAIN TECHNIQUE: Multidetector CT imaging of the head and neck was performed using the standard protocol during bolus administration of intravenous contrast. Multiplanar CT image reconstructions and MIPs were obtained to evaluate the vascular anatomy. Carotid stenosis measurements (when applicable) are obtained utilizing NASCET criteria, using the distal internal carotid diameter as the denominator. Multiphase CT imaging of the brain was performed following IV bolus contrast injection. Subsequent parametric perfusion maps were calculated using RAPID software. CONTRAST:  104mL ISOVUE-370 IOPAMIDOL (ISOVUE-370) INJECTION 76% 90 cc Isovue 370 COMPARISON:  None. 11/10/2017 CT angiogram of the head and neck. FINDINGS: CT HEAD FINDINGS Brain: No evidence of acute infarction, hemorrhage, hydrocephalus, extra-axial collection or mass lesion/mass effect. Vascular: No hyperdense vessel or unexpected calcification. Skull: Normal. Negative for fracture or focal lesion. Sinuses/Orbits: No acute finding. Other: None. ASPECTS [facility] Stroke Program Early CT Score) - Ganglionic level infarction (caudate, lentiform nuclei, internal capsule, insula, M1-M3 cortex): 7 - Supraganglionic infarction (M4-M6  cortex): 3 Total score (0-10 with 10 being normal): 10 Review of the MIP images confirms the above findings CTA NECK FINDINGS Aortic arch: Bovine variant branching. Imaged portion shows no evidence of aneurysm or dissection. No significant stenosis of the major arch vessel origins. Right carotid system: No evidence of dissection, stenosis (50% or greater) or occlusion. Focal  posterior wall thickening of the left proximal ICA is less pronounced on the current study than on the prior CT angiogram of the neck (series 9, image 73). Left carotid system: No evidence of dissection, stenosis (50% or greater) or occlusion. Vertebral arteries: Codominant. No evidence of dissection, stenosis (50% or greater) or occlusion. Skeleton: No acute osseous abnormality. Other neck: Negative. Upper chest: Negative. Review of the MIP images confirms the above findings CTA HEAD FINDINGS Anterior circulation: No significant stenosis, proximal occlusion, aneurysm, or vascular malformation. Posterior circulation: No significant stenosis, proximal occlusion, aneurysm, or vascular malformation. Venous sinuses: As permitted by contrast timing, patent. Anatomic variants: None significant. Dominant right A1 and mildly larger right ICA likely due to increased flow when compared with the smaller left A1 and ICA. Review of the MIP images confirms the above findings CT Brain Perfusion Findings: CBF (<30%) Volume: 45mL Perfusion (Tmax>6.0s) volume: 61mL Mismatch Volume: 41mL Infarction Location:Not identified. There are a few foci of T-max greater than 4 seconds in the right MCA distribution. IMPRESSION: 1. Patent carotid and vertebral arteries. No dissection, aneurysm, or hemodynamically significant stenosis utilizing NASCET criteria. 2. Focal posterior wall thickening of the right proximal ICA is less pronounced on the current study than on the prior study. Findings may represent a soft plaque rupture. Additionally, given neck being held in flexion on the prior study, this finding may have represented a vessel kink. 3. Stable patent anterior and posterior intracranial circulation. No large vessel occlusion, aneurysm, or significant stenosis. 4. Stable negative noncontrast CT of the head. 5. No acute stroke or ischemic penumbra by CT perfusion criteria. There are a few foci of T-max greater than 4 seconds in the right MCA  distribution which may reflect small vessel ischemic changes. These results were called by telephone at the time of interpretation on 11/11/2017 at 3:30 am to Mallory. Roland Rack , who verbally acknowledged these results. Electronically Signed   By: Kristine Garbe M.D.   On: 11/11/2017 03:29   US Pelvic Complete With Transvaginal  Result Date: 11/12/2017 CLINICAL DATA:  Menorrhagia EXAM: TRANSABDOMINAL AND TRANSVAGINAL ULTRASOUND OF PELVIS TECHNIQUE: Both transabdominal and transvaginal ultrasound examinations of the pelvis were performed. Transabdominal technique was performed for global imaging of the pelvis including uterus, ovaries, adnexal regions, and pelvic cul-de-sac. It was necessary to proceed with endovaginal exam following the transabdominal exam to visualize the endometrium and ovaries. COMPARISON:  None FINDINGS: Uterus Measurements: 8.8 x 5.1 x 6.5 cm. Several fibroids are identified, one in the posterior uterine body, intramural in appearance measuring 2 x 1.6 x 2.4 cm, the second in anteriorly and predominantly intramural but slightly subserosal measuring 1.3 x 1.1 x 1.4 cm, and a third is anterior and right-sided measuring 2 x 1.3 x 1.8 cm and subserosal. Endometrium Thickness: 3.9 mm.  No focal abnormality visualized. Right ovary Measurements: 4 x 2 x 2.2 cm.  Normal appearance/no adnexal mass. Left ovary Measurements: 3.8 x 1.9 x 2.7 cm. Normal appearance/no adnexal mass. Other findings No abnormal free fluid. IMPRESSION: Leiomyomas of the uterus. Thin endometrial lining without intraluminal abnormality. No adnexal mass. Electronically Signed   By: Meredith Leeds.D.  On: 11/12/2017 21:29   Ct Head Code Stroke Wo Contrast  Result Date: 11/10/2017 CLINICAL DATA:  Code stroke. Acute onset lower extremity weakness, felt to RIGHT. RIGHT vision changes. History of TIA. EXAM: CT HEAD WITHOUT CONTRAST TECHNIQUE: Contiguous axial images were obtained from the base of the skull through  the vertex without intravenous contrast. COMPARISON:  None. FINDINGS: BRAIN: No intraparenchymal hemorrhage, mass effect nor midline shift. The ventricles and sulci are normal. No acute large vascular territory infarcts. No abnormal extra-axial fluid collections. 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 Basal cisterns are patent. VASCULAR: Unremarkable. SKULL/SOFT TISSUES: No skull fracture. No significant soft tissue swelling. ORBITS/SINUSES: The included ocular globes and orbital contents are normal.Trace paranasal sinus mucosal thickening. Mastoid air cells are well aerated. OTHER: None. ASPECTS [facility] Stroke Program Early CT Score) - Ganglionic level infarction (caudate, lentiform nuclei, internal capsule, insula, M1-M3 cortex): 7 - Supraganglionic infarction (M4-M6 cortex): 3 Total score (0-10 with 10 being normal): 10 IMPRESSION: 1. Negative non-contrast CT HEAD. 2. ASPECTS is 10. 3. Critical Value/emergent results text paged to [place], Neurology via AMION secure system on 11/10/2017 at 9:37 pm, including interpreting physician's phone number. Electronically Signed   By: Elon Alas M.D.   On: 11/10/2017 21:38      TODAY-DAY OF DISCHARGE:  Subjective:   Mallory Robinson today has no headache,no chest abdominal pain,no new weakness tingling or numbness, feels much better wants to go home today.   Objective:   Blood pressure (!) 158/98, pulse 60, temperature 98.5 F (36.9 C), temperature source Oral, resp. rate 14, height 5\' 3"  (1.6 m), weight 74.8 kg, last menstrual period 11/12/2017, SpO2 100 %.  Intake/Output Summary (Last 24 hours) at 11/14/2017 0747 Last data filed at 11/14/2017 0310 Gross per 24 hour  Intake 320 ml  Output -  Net 320 ml   Filed Weights   11/10/17 2127 11/11/17 0829  Weight: 66.8 kg 74.8 kg    Exam: Awake Alert, Oriented *3, No new F.N deficits, Normal affect [place].AT,PERRAL Supple Neck,No JVD,  No cervical lymphadenopathy appriciated.  Symmetrical Chest wall movement, Good air movement bilaterally, CTAB RRR,No Gallops,Rubs or new Murmurs, No Parasternal Heave +ve B.Sounds, Abd Soft, Non tender, No organomegaly appriciated, No rebound -guarding or rigidity. No Cyanosis, Clubbing or edema, No new Rash or bruise   PERTINENT RADIOLOGIC STUDIES: Ct Angio Head W Or Wo Contrast  Result Date: 11/11/2017 CLINICAL DATA:  42 y/o F; change in neuro exam. Stroke for follow-up. EXAM: CT ANGIOGRAPHY HEAD AND NECK CT PERFUSION BRAIN TECHNIQUE: Multidetector CT imaging of the head and neck was performed using the standard protocol during bolus administration of intravenous contrast. Multiplanar CT image reconstructions and MIPs were obtained to evaluate the vascular anatomy. Carotid stenosis measurements (when applicable) are obtained utilizing NASCET criteria, using the distal internal carotid diameter as the denominator. Multiphase CT imaging of the brain was performed following IV bolus contrast injection. Subsequent parametric perfusion maps were calculated using RAPID software. CONTRAST:  75mL ISOVUE-370 IOPAMIDOL (ISOVUE-370) INJECTION 76% 90 cc Isovue 370 COMPARISON:  None. 11/10/2017 CT angiogram of the head and neck. FINDINGS: CT HEAD FINDINGS Brain: No evidence of acute infarction, hemorrhage, hydrocephalus, extra-axial collection or mass lesion/mass effect. Vascular: No hyperdense vessel or unexpected calcification. Skull: Normal. Negative for fracture or focal lesion. Sinuses/Orbits: No acute finding. Other: None. ASPECTS [facility] Stroke Program Early CT Score) - Ganglionic level infarction (caudate, lentiform nuclei, internal capsule, insula, M1-M3 cortex): 7 - Supraganglionic  infarction (M4-M6 cortex): 3 Total score (0-10 with 10 being normal): 10 Review of the MIP images confirms the above findings CTA NECK FINDINGS Aortic arch: Bovine variant branching. Imaged portion shows no evidence of aneurysm  or dissection. No significant stenosis of the major arch vessel origins. Right carotid system: No evidence of dissection, stenosis (50% or greater) or occlusion. Focal posterior wall thickening of the left proximal ICA is less pronounced on the current study than on the prior CT angiogram of the neck (series 9, image 73). Left carotid system: No evidence of dissection, stenosis (50% or greater) or occlusion. Vertebral arteries: Codominant. No evidence of dissection, stenosis (50% or greater) or occlusion. Skeleton: No acute osseous abnormality. Other neck: Negative. Upper chest: Negative. Review of the MIP images confirms the above findings CTA HEAD FINDINGS Anterior circulation: No significant stenosis, proximal occlusion, aneurysm, or vascular malformation. Posterior circulation: No significant stenosis, proximal occlusion, aneurysm, or vascular malformation. Venous sinuses: As permitted by contrast timing, patent. Anatomic variants: None significant. Dominant right A1 and mildly larger right ICA likely due to increased flow when compared with the smaller left A1 and ICA. Review of the MIP images confirms the above findings CT Brain Perfusion Findings: CBF (<30%) Volume: 91mL Perfusion (Tmax>6.0s) volume: 45mL Mismatch Volume: 42mL Infarction Location:Not identified. There are a few foci of T-max greater than 4 seconds in the right MCA distribution. IMPRESSION: 1. Patent carotid and vertebral arteries. No dissection, aneurysm, or hemodynamically significant stenosis utilizing NASCET criteria. 2. Focal posterior wall thickening of the right proximal ICA is less pronounced on the current study than on the prior study. Findings may represent a soft plaque rupture. Additionally, given neck being held in flexion on the prior study, this finding may have represented a vessel kink. 3. Stable patent anterior and posterior intracranial circulation. No large vessel occlusion, aneurysm, or significant stenosis. 4. Stable  negative noncontrast CT of the head. 5. No acute stroke or ischemic penumbra by CT perfusion criteria. There are a few foci of T-max greater than 4 seconds in the right MCA distribution which may reflect small vessel ischemic changes. These results were called by telephone at the time of interpretation on 11/11/2017 at 3:30 am to Mallory. Roland Rack , who verbally acknowledged these results. Electronically Signed   By: Kristine Garbe M.D.   On: 11/11/2017 03:29   Ct Angio Head W Or Wo Contrast  Result Date: 11/10/2017 CLINICAL DATA:  Acute onset lower extremity weakness, fell to RIGHT. Vision changes. EXAM: CT ANGIOGRAPHY HEAD AND NECK TECHNIQUE: Multidetector CT imaging of the head and neck was performed using the standard protocol during bolus administration of intravenous contrast. Multiplanar CT image reconstructions and MIPs were obtained to evaluate the vascular anatomy. Carotid stenosis measurements (when applicable) are obtained utilizing NASCET criteria, using the distal internal carotid diameter as the denominator. COMPARISON:  CT HEAD November 10, 2017 at 9:33 p.m. FINDINGS: CTA NECK FINDINGS: AORTIC ARCH: Normal appearance of the thoracic arch, 2 vessel arch is a normal variant. The origins of the innominate, left Common carotid artery and subclavian artery are widely patent. RIGHT CAROTID SYSTEM: Common carotid artery is patent. Shelf-like filling defect RIGHT carotid bulb without hemodynamically significant stenosis by NASCET criteria. LEFT CAROTID SYSTEM: Common carotid artery is patent. Normal appearance of the carotid bifurcation without hemodynamically significant stenosis by NASCET criteria. Normal appearance of the internal carotid artery. VERTEBRAL ARTERIES:Codominant vertebral arteries. Normal appearance of the vertebral arteries, widely patent. SKELETON: No acute osseous process though bone windows  have not been submitted. OTHER NECK: Soft tissues of the neck are nonacute  though, not tailored for evaluation. UPPER CHEST: Included lung apices are clear. No superior mediastinal lymphadenopathy. CTA HEAD FINDINGS: ANTERIOR CIRCULATION: Patent cervical internal carotid arteries, petrous, cavernous and supra clinoid internal carotid arteries. Patent anterior communicating artery. Patent anterior and middle cerebral arteries, mild luminal irregularity. Developmentally smaller LEFT MCA. No large vessel occlusion, significant stenosis, contrast extravasation or aneurysm. POSTERIOR CIRCULATION: Patent vertebral arteries, vertebrobasilar junction and basilar artery, as well as main branch vessels. Patent posterior cerebral arteries mild luminal irregularity. No large vessel occlusion, significant stenosis, contrast extravasation or aneurysm. VENOUS SINUSES: Major dural venous sinuses are patent though not tailored for evaluation on this angiographic examination. ANATOMIC VARIANTS: None. DELAYED PHASE: Not performed. MIP images reviewed. IMPRESSION: CTA NECK: 1. RIGHT carotid bulb intramural hematoma, less likely atypical fibromuscular dysplasia. No flow-limiting stenosis. 2. Patent vertebral arteries. CTA HEAD: 1.  No emergent large vessel occlusion or flow-limiting stenosis. 2. Mild intracranial atherosclerosis, less likely vasculopathy. Critical Value/emergent results were called by telephone at the time of interpretation on 11/10/2017 at 9:45 pm to Mallory. Roland Rack , who verbally acknowledged these results. Electronically Signed   By: Elon Alas M.D.   On: 11/10/2017 21:51   Ct Angio Neck W Or Wo Contrast  Result Date: 11/11/2017 CLINICAL DATA:  42 y/o F; change in neuro exam. Stroke for follow-up. EXAM: CT ANGIOGRAPHY HEAD AND NECK CT PERFUSION BRAIN TECHNIQUE: Multidetector CT imaging of the head and neck was performed using the standard protocol during bolus administration of intravenous contrast. Multiplanar CT image reconstructions and MIPs were obtained to evaluate  the vascular anatomy. Carotid stenosis measurements (when applicable) are obtained utilizing NASCET criteria, using the distal internal carotid diameter as the denominator. Multiphase CT imaging of the brain was performed following IV bolus contrast injection. Subsequent parametric perfusion maps were calculated using RAPID software. CONTRAST:  76mL ISOVUE-370 IOPAMIDOL (ISOVUE-370) INJECTION 76% 90 cc Isovue 370 COMPARISON:  None. 11/10/2017 CT angiogram of the head and neck. FINDINGS: CT HEAD FINDINGS Brain: No evidence of acute infarction, hemorrhage, hydrocephalus, extra-axial collection or mass lesion/mass effect. Vascular: No hyperdense vessel or unexpected calcification. Skull: Normal. Negative for fracture or focal lesion. Sinuses/Orbits: No acute finding. Other: None. ASPECTS ([place] Stroke Program Early CT Score) - Ganglionic level infarction (caudate, lentiform nuclei, internal capsule, insula, M1-M3 cortex): 7 - Supraganglionic infarction (M4-M6 cortex): 3 Total score (0-10 with 10 being normal): 10 Review of the MIP images confirms the above findings CTA NECK FINDINGS Aortic arch: Bovine variant branching. Imaged portion shows no evidence of aneurysm or dissection. No significant stenosis of the major arch vessel origins. Right carotid system: No evidence of dissection, stenosis (50% or greater) or occlusion. Focal posterior wall thickening of the left proximal ICA is less pronounced on the current study than on the prior CT angiogram of the neck (series 9, image 73). Left carotid system: No evidence of dissection, stenosis (50% or greater) or occlusion. Vertebral arteries: Codominant. No evidence of dissection, stenosis (50% or greater) or occlusion. Skeleton: No acute osseous abnormality. Other neck: Negative. Upper chest: Negative. Review of the MIP images confirms the above findings CTA HEAD FINDINGS Anterior circulation: No significant stenosis, proximal occlusion, aneurysm, or vascular  malformation. Posterior circulation: No significant stenosis, proximal occlusion, aneurysm, or vascular malformation. Venous sinuses: As permitted by contrast timing, patent. Anatomic variants: None significant. Dominant right A1 and mildly larger right ICA likely due to increased flow when compared with  the smaller left A1 and ICA. Review of the MIP images confirms the above findings CT Brain Perfusion Findings: CBF (<30%) Volume: 42mL Perfusion (Tmax>6.0s) volume: 84mL Mismatch Volume: 42mL Infarction Location:Not identified. There are a few foci of T-max greater than 4 seconds in the right MCA distribution. IMPRESSION: 1. Patent carotid and vertebral arteries. No dissection, aneurysm, or hemodynamically significant stenosis utilizing NASCET criteria. 2. Focal posterior wall thickening of the right proximal ICA is less pronounced on the current study than on the prior study. Findings may represent a soft plaque rupture. Additionally, given neck being held in flexion on the prior study, this finding may have represented a vessel kink. 3. Stable patent anterior and posterior intracranial circulation. No large vessel occlusion, aneurysm, or significant stenosis. 4. Stable negative noncontrast CT of the head. 5. No acute stroke or ischemic penumbra by CT perfusion criteria. There are a few foci of T-max greater than 4 seconds in the right MCA distribution which may reflect small vessel ischemic changes. These results were called by telephone at the time of interpretation on 11/11/2017 at 3:30 am to Mallory. Roland Rack , who verbally acknowledged these results. Electronically Signed   By: Kristine Garbe M.D.   On: 11/11/2017 03:29   Ct Angio Neck W Or Wo Contrast  Result Date: 11/10/2017 CLINICAL DATA:  Acute onset lower extremity weakness, fell to RIGHT. Vision changes. EXAM: CT ANGIOGRAPHY HEAD AND NECK TECHNIQUE: Multidetector CT imaging of the head and neck was performed using the standard protocol  during bolus administration of intravenous contrast. Multiplanar CT image reconstructions and MIPs were obtained to evaluate the vascular anatomy. Carotid stenosis measurements (when applicable) are obtained utilizing NASCET criteria, using the distal internal carotid diameter as the denominator. COMPARISON:  CT HEAD November 10, 2017 at 9:33 p.m. FINDINGS: CTA NECK FINDINGS: AORTIC ARCH: Normal appearance of the thoracic arch, 2 vessel arch is a normal variant. The origins of the innominate, left Common carotid artery and subclavian artery are widely patent. RIGHT CAROTID SYSTEM: Common carotid artery is patent. Shelf-like filling defect RIGHT carotid bulb without hemodynamically significant stenosis by NASCET criteria. LEFT CAROTID SYSTEM: Common carotid artery is patent. Normal appearance of the carotid bifurcation without hemodynamically significant stenosis by NASCET criteria. Normal appearance of the internal carotid artery. VERTEBRAL ARTERIES:Codominant vertebral arteries. Normal appearance of the vertebral arteries, widely patent. SKELETON: No acute osseous process though bone windows have not been submitted. OTHER NECK: Soft tissues of the neck are nonacute though, not tailored for evaluation. UPPER CHEST: Included lung apices are clear. No superior mediastinal lymphadenopathy. CTA HEAD FINDINGS: ANTERIOR CIRCULATION: Patent cervical internal carotid arteries, petrous, cavernous and supra clinoid internal carotid arteries. Patent anterior communicating artery. Patent anterior and middle cerebral arteries, mild luminal irregularity. Developmentally smaller LEFT MCA. No large vessel occlusion, significant stenosis, contrast extravasation or aneurysm. POSTERIOR CIRCULATION: Patent vertebral arteries, vertebrobasilar junction and basilar artery, as well as main branch vessels. Patent posterior cerebral arteries mild luminal irregularity. No large vessel occlusion, significant stenosis, contrast extravasation  or aneurysm. VENOUS SINUSES: Major dural venous sinuses are patent though not tailored for evaluation on this angiographic examination. ANATOMIC VARIANTS: None. DELAYED PHASE: Not performed. MIP images reviewed. IMPRESSION: CTA NECK: 1. RIGHT carotid bulb intramural hematoma, less likely atypical fibromuscular dysplasia. No flow-limiting stenosis. 2. Patent vertebral arteries. CTA HEAD: 1.  No emergent large vessel occlusion or flow-limiting stenosis. 2. Mild intracranial atherosclerosis, less likely vasculopathy. Critical Value/emergent results were called by telephone at the time of interpretation on 11/10/2017  at 9:45 pm to Mallory. Roland Rack , who verbally acknowledged these results. Electronically Signed   By: Elon Alas M.D.   On: 11/10/2017 21:51   Mr Brain Wo Contrast  Result Date: 11/11/2017 CLINICAL DATA:  42 y/o F; change in neuro exam, evaluation for stroke. Follow-up of leg numbness and headache. EXAM: MRI HEAD WITHOUT CONTRAST TECHNIQUE: Multiplanar, multiecho pulse sequences of the brain and surrounding structures were obtained without intravenous contrast. COMPARISON:  11/11/2017 CT head, CTA head, CT perfusion head. FINDINGS: Brain: There several scattered subcentimeter foci of reduced diffusion throughout the right frontal and parietal cortices, right temporoparietal junction, right frontal periventricular white matter, and the right caudate nucleus compatible with acute/early subacute infarction. No associated hemorrhage or mass effect. No extra-axial collection, hydrocephalus, focal mass effect of the brain, effacement of basilar cisterns, or herniation. Vascular: Normal flow voids. Skull and upper cervical spine: Normal marrow signal. Sinuses/Orbits: Negative. Other: None. 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 No associated hemorrhage or mass effect. These results were called by telephone at the  time of interpretation on 11/11/2017 at 5:41 am to Mallory. Leonel Ramsay, who verbally acknowledged these results. Electronically Signed   By: Kristine Garbe M.D.   On: 11/11/2017 05:45   Ct Cerebral Perfusion W Contrast  Result Date: 11/11/2017 CLINICAL DATA:  42 y/o F; change in neuro exam. Stroke for follow-up. EXAM: CT ANGIOGRAPHY HEAD AND NECK CT PERFUSION BRAIN TECHNIQUE: Multidetector CT imaging of the head and neck was performed using the standard protocol during bolus administration of intravenous contrast. Multiplanar CT image reconstructions and MIPs were obtained to evaluate the vascular anatomy. Carotid stenosis measurements (when applicable) are obtained utilizing NASCET criteria, using the distal internal carotid diameter as the denominator. Multiphase CT imaging of the brain was performed following IV bolus contrast injection. Subsequent parametric perfusion maps were calculated using RAPID software. CONTRAST:  69mL ISOVUE-370 IOPAMIDOL (ISOVUE-370) INJECTION 76% 90 cc Isovue 370 COMPARISON:  None. 11/10/2017 CT angiogram of the head and neck. FINDINGS: CT HEAD FINDINGS Brain: No evidence of acute infarction, hemorrhage, hydrocephalus, extra-axial collection or mass lesion/mass effect. Vascular: No hyperdense vessel or unexpected calcification. Skull: Normal. Negative for fracture or focal lesion. Sinuses/Orbits: No acute finding. Other: None. ASPECTS ([place] Stroke Program Early CT Score) - Ganglionic level infarction (caudate, lentiform nuclei, internal capsule, insula, M1-M3 cortex): 7 - Supraganglionic infarction (M4-M6 cortex): 3 Total score (0-10 with 10 being normal): 10 Review of the MIP images confirms the above findings CTA NECK FINDINGS Aortic arch: Bovine variant branching. Imaged portion shows no evidence of aneurysm or dissection. No significant stenosis of the major arch vessel origins. Right carotid system: No evidence of dissection, stenosis (50% or greater) or occlusion.  Focal posterior wall thickening of the left proximal ICA is less pronounced on the current study than on the prior CT angiogram of the neck (series 9, image 73). Left carotid system: No evidence of dissection, stenosis (50% or greater) or occlusion. Vertebral arteries: Codominant. No evidence of dissection, stenosis (50% or greater) or occlusion. Skeleton: No acute osseous abnormality. Other neck: Negative. Upper chest: Negative. Review of the MIP images confirms the above findings CTA HEAD FINDINGS Anterior circulation: No significant stenosis, proximal occlusion, aneurysm, or vascular malformation. Posterior circulation: No significant stenosis, proximal occlusion, aneurysm, or vascular malformation. Venous sinuses: As permitted by contrast timing, patent. Anatomic variants: None significant. Dominant right A1 and mildly larger right ICA likely due to increased flow when compared with  the smaller left A1 and ICA. Review of the MIP images confirms the above findings CT Brain Perfusion Findings: CBF (<30%) Volume: 76mL Perfusion (Tmax>6.0s) volume: 14mL Mismatch Volume: 59mL Infarction Location:Not identified. There are a few foci of T-max greater than 4 seconds in the right MCA distribution. IMPRESSION: 1. Patent carotid and vertebral arteries. No dissection, aneurysm, or hemodynamically significant stenosis utilizing NASCET criteria. 2. Focal posterior wall thickening of the right proximal ICA is less pronounced on the current study than on the prior study. Findings may represent a soft plaque rupture. Additionally, given neck being held in flexion on the prior study, this finding may have represented a vessel kink. 3. Stable patent anterior and posterior intracranial circulation. No large vessel occlusion, aneurysm, or significant stenosis. 4. Stable negative noncontrast CT of the head. 5. No acute stroke or ischemic penumbra by CT perfusion criteria. There are a few foci of T-max greater than 4 seconds in the right  MCA distribution which may reflect small vessel ischemic changes. These results were called by telephone at the time of interpretation on 11/11/2017 at 3:30 am to Mallory. Roland Rack , who verbally acknowledged these results. Electronically Signed   By: Kristine Garbe M.D.   On: 11/11/2017 03:29   US Pelvic Complete With Transvaginal  Result Date: 11/12/2017 CLINICAL DATA:  Menorrhagia EXAM: TRANSABDOMINAL AND TRANSVAGINAL ULTRASOUND OF PELVIS TECHNIQUE: Both transabdominal and transvaginal ultrasound examinations of the pelvis were performed. Transabdominal technique was performed for global imaging of the pelvis including uterus, ovaries, adnexal regions, and pelvic cul-de-sac. It was necessary to proceed with endovaginal exam following the transabdominal exam to visualize the endometrium and ovaries. COMPARISON:  None FINDINGS: Uterus Measurements: 8.8 x 5.1 x 6.5 cm. Several fibroids are identified, one in the posterior uterine body, intramural in appearance measuring 2 x 1.6 x 2.4 cm, the second in anteriorly and predominantly intramural but slightly subserosal measuring 1.3 x 1.1 x 1.4 cm, and a third is anterior and right-sided measuring 2 x 1.3 x 1.8 cm and subserosal. Endometrium Thickness: 3.9 mm.  No focal abnormality visualized. Right ovary Measurements: 4 x 2 x 2.2 cm.  Normal appearance/no adnexal mass. Left ovary Measurements: 3.8 x 1.9 x 2.7 cm. Normal appearance/no adnexal mass. Other findings No abnormal free fluid. IMPRESSION: Leiomyomas of the uterus. Thin endometrial lining without intraluminal abnormality. No adnexal mass. Electronically Signed   By: Ashley Royalty M.D.   On: 11/12/2017 21:29   Ct Head Code Stroke Wo Contrast  Result Date: 11/10/2017 CLINICAL DATA:  Code stroke. Acute onset lower extremity weakness, felt to RIGHT. RIGHT vision changes. History of TIA. EXAM: CT HEAD WITHOUT CONTRAST TECHNIQUE: Contiguous axial images were obtained from the base of the skull  through the vertex without intravenous contrast. COMPARISON:  None. FINDINGS: BRAIN: No intraparenchymal hemorrhage, mass effect nor midline shift. The ventricles and sulci are normal. No acute large vascular territory infarcts. No abnormal extra-axial fluid collections. 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 Basal cisterns are patent. VASCULAR: Unremarkable. SKULL/SOFT TISSUES: No skull fracture. No significant soft tissue swelling. ORBITS/SINUSES: The included ocular globes and orbital contents are normal.Trace paranasal sinus mucosal thickening. Mastoid air cells are well aerated. OTHER: None. ASPECTS [facility] Stroke Program Early CT Score) - Ganglionic level infarction (caudate, lentiform nuclei, internal capsule, insula, M1-M3 cortex): 7 - Supraganglionic infarction (M4-M6 cortex): 3 Total score (0-10 with 10 being normal): 10 IMPRESSION: 1. Negative non-contrast CT HEAD. 2. ASPECTS is 10. 3.  Critical Value/emergent results text paged to [place], Neurology via AMION secure system on 11/10/2017 at 9:37 pm, including interpreting physician's phone number. Electronically Signed   By: Elon Alas M.D.   On: 11/10/2017 21:38     PERTINENT LAB RESULTS: CBC: Recent Labs    11/12/17 0219 11/13/17 0645  WBC 15.4* 16.8*  HGB 8.6* 8.5*  HCT 29.6* 30.1*  PLT 1,081* 1,048*   CMET CMP     Component Value Date/Time   NA 139 11/11/2017 1111   K 3.6 11/11/2017 1111   CL 106 11/11/2017 1111   CO2 23 11/11/2017 1111   GLUCOSE 101 (H) 11/11/2017 1111   BUN <5 (L) 11/11/2017 1111   CREATININE 0.76 11/11/2017 1111   CALCIUM 9.0 11/11/2017 1111   PROT 7.7 11/10/2017 2129   ALBUMIN 4.2 11/10/2017 2129   AST 34 11/10/2017 2129   ALT 20 11/10/2017 2129   ALKPHOS 63 11/10/2017 2129   BILITOT 0.6 11/10/2017 2129   GFRNONAA >60 11/11/2017 1111   GFRAA >60 11/11/2017 1111    GFR Estimated Creatinine Clearance: 87.9 mL/min (by C-G  formula based on SCr of 0.76 mg/dL). No results for input(s): LIPASE, AMYLASE in the last 72 hours. No results for input(s): CKTOTAL, CKMB, CKMBINDEX, TROPONINI in the last 72 hours. Invalid input(s): POCBNP No results for input(s): DDIMER in the last 72 hours. Recent Labs    11/12/17 0219  HGBA1C 5.6   Recent Labs    11/12/17 0219  CHOL 52  HDL 28*  LDLCALC 5  TRIG 96  CHOLHDL 1.9   No results for input(s): TSH, T4TOTAL, T3FREE, THYROIDAB in the last 72 hours.  Invalid input(s): FREET3 No results for input(s): VITAMINB12, FOLATE, FERRITIN, TIBC, IRON, RETICCTPCT in the last 72 hours. Coags: Recent Labs    11/13/17 0408 11/14/17 0534  INR 1.10 1.31   Microbiology: No results found for this or any previous visit (from the past 240 hour(s)).  FURTHER DISCHARGE INSTRUCTIONS:  Get Medicines reviewed and adjusted: Please take all your medications with you for your next visit with your Primary MD  Laboratory/radiological data: Please request your Primary MD to go over all hospital tests and procedure/radiological results at the follow up, please ask your Primary MD to get all Hospital records sent to his/her office.  In some cases, they will be blood work, cultures and biopsy results pending at the time of your discharge. Please request that your primary care M.D. goes through all the records of your hospital data and follows up on these results.  Also Note the following: If you experience worsening of your admission symptoms, develop shortness of breath, life threatening emergency, suicidal or homicidal thoughts you must seek medical attention immediately by calling 911 or calling your MD immediately  if symptoms less severe.  You must read complete instructions/literature along with all the possible adverse reactions/side effects for all the Medicines you take and that have been prescribed to you. Take any new Medicines after you have completely understood and accpet all the  possible adverse reactions/side effects.   Do not drive when taking Pain medications or sleeping medications (Benzodaizepines)  Do not take more than prescribed Pain, Sleep and Anxiety Medications. It is not advisable to combine anxiety,sleep and pain medications without talking with your primary care practitioner  Special Instructions: If you have smoked or chewed Tobacco  in the last 2 yrs please stop smoking, stop any regular Alcohol  and or any Recreational drug use.  Wear Seat belts while  driving.  Please note: You were cared for by a hospitalist during your hospital stay. Once you are discharged, your primary care physician will handle any further medical issues. Please note that NO REFILLS for any discharge medications will be authorized once you are discharged, as it is imperative that you return to your primary care physician (or establish a relationship with a primary care physician if you do not have one) for your post hospital discharge needs so that they can reassess your need for medications and monitor your lab values.  Total Time spent coordinating discharge including counseling, education and face to face time equals 35 minutes.  SignedOren Binet 11/14/2017 7:47 AM

## 2017-11-14 NOTE — Progress Notes (Signed)
Nsg Discharge Note  Admit Date:  11/10/2017 Discharge date: 11/14/2017   Olena Mater to be D/C'd Home per MD order.  AVS completed.  Copy for chart, and copy for patient signed, and dated. Patient/caregiver able to verbalize understanding.  Discharge Medication:   Discharge Assessment: Vitals:   11/13/17 2133 11/14/17 0446  BP: (!) 150/100 (!) 158/98  Pulse: 77 60  Resp:  14  Temp: 98.2 F (36.8 C) 98.5 F (36.9 C)  SpO2: 100% 100%   Skin clean, dry and intact without evidence of skin break down, no evidence of skin tears noted. IV catheter discontinued intact. Site without signs and symptoms of complications - no redness or edema noted at insertion site, patient denies c/o pain - only slight tenderness at site.  Dressing with slight pressure applied.  D/c Instructions-Education: Discharge instructions given to patient/family with verbalized understanding. D/c education completed with patient/family including follow up instructions, medication list, d/c activities limitations if indicated, with other d/c instructions as indicated by MD - patient able to verbalize understanding, all questions fully answered. Patient instructed to return to ED, call 911, or call MD for any changes in condition.  Patient escorted via [place], and D/C home via private auto.  Lindie Roberson Margaretha Sheffield, RN 11/14/2017 10:55 AM

## 2017-11-14 NOTE — Progress Notes (Signed)
Physical Therapy Treatment Patient Details Name: Mallory Robinson MRN: [id number] DOB: April 16, 1975 Today's Date: 11/14/2017    History of Present Illness Pt is a 42 y.o. admitted 11/10/17 with L-side weakness, vision changes and fall. 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 PMH includes bipolar disorder, anemia, depression.   PT Comments    Pt continues to progress with mobility. Pt anticipating d/c today. Today's session focused on stair training as pt has flight of steps to enter mother's apartment. Pt able to ascend/descend steps with BUE support on rail; supervision for safety. Demonstrates improved ability to scan environment and problem solve. Continues to have L-side inattention, decreased LLE strength/sensation, balance deficits, and difficulty with higher level processing. Continue to recommend HHPT and likely eventual neuro OP PT to address these.    Follow Up Recommendations  Home health PT;Supervision/Assistance - 24 hour     Equipment Recommendations  Rolling walker with 5" wheels;3in1 (PT);Other (comment)(tub bench)    Recommendations for Other Services       Precautions / Restrictions Precautions Precautions: Fall Precaution Comments: Decreased LLE sensation; L side inattention Restrictions Weight Bearing Restrictions: No    Mobility  Bed Mobility Overal bed mobility: Modified Independent Bed Mobility: Supine to Sit           General bed mobility comments: Uses RUE to pull on bed rail when exiting bed to L-side  Transfers Overall transfer level: Needs assistance Equipment used: Rolling walker (2 wheeled) Transfers: Sit to/from Stand Sit to Stand: Supervision            Ambulation/Gait Ambulation/Gait assistance: Supervision     Gait Pattern/deviations: Step-through pattern;Decreased step length - right;Decreased weight  shift to left;Decreased dorsiflexion - left Gait velocity: Decreased Gait velocity interpretation: 1.31 - 2.62 ft/sec, indicative of limited community ambulator General Gait Details: No physical assist or guarding required; supervision for safety due to L-side inattention. Slowed speed, able to increase velocity with cues. Cues for increased step length with RLE and increased L hip flexion to keep L foot from dragging   Stairs Stairs: Yes Stairs assistance: Supervision Stair Management: One rail Left;Step to pattern;Sideways Number of Stairs: 11 General stair comments: Ascend/descended 11 steps with BUE support on L-side rail; educ on technique. Supervision for safety   Wheelchair Mobility    Modified Rankin (Stroke Patients Only) Modified Rankin (Stroke Patients Only) Pre-Morbid Rankin Score: No significant disability Modified Rankin: Moderately severe disability     Balance Overall balance assessment: Needs assistance   Sitting balance-Leahy Scale: Good       Standing balance-Leahy Scale: Fair Standing balance comment: Can perform ADL tasks at sink without UE support               High Level Balance Comments: Able to pick object off floor with single UE support            Cognition Arousal/Alertness: Awake/alert Behavior During Therapy: WFL for tasks assessed/performed Overall Cognitive Status: Impaired/Different from baseline Area of Impairment: Problem solving;Safety/judgement                         Safety/Judgement: Decreased awareness of deficits   Problem Solving: Requires verbal cues;Difficulty sequencing General Comments: Demonstrating improved ability to scan to L-side without cues, initially running into sink on L-side but able to self-correct. Able to navigate back to room  Exercises      General Comments General comments (skin integrity, edema, etc.): RN to get patient stroke booklet for further education      Pertinent  Vitals/Pain Pain Assessment: No/denies pain    Home Living                      Prior Function            PT Goals (current goals can now be found in the care plan section) Acute Rehab PT Goals Patient Stated Goal: Return home today; get back to work eventually PT Goal Formulation: With patient Time For Goal Achievement: 11/26/17 Potential to Achieve Goals: Good Progress towards PT goals: Progressing toward goals    Frequency    Min 4X/week      PT Plan Current plan remains appropriate    Co-evaluation              AM-PAC PT "6 Clicks" Daily Activity  Outcome Measure  Difficulty turning over in bed (including adjusting bedclothes, sheets and blankets)?: None Difficulty moving from lying on back to sitting on the side of the bed? : None Difficulty sitting down on and standing up from a chair with arms (e.g., wheelchair, bedside commode, etc,.)?: A Little Help needed moving to and from a bed to chair (including a wheelchair)?: A Little Help needed walking in hospital room?: A Little Help needed climbing 3-5 steps with a railing? : A Little 6 Click Score: 20    End of Session Equipment Utilized During Treatment: Gait belt Activity Tolerance: Patient tolerated treatment well Patient left: in chair;with call bell/phone within reach;with nursing/sitter in room Nurse Communication: Mobility status PT Visit Diagnosis: Other abnormalities of gait and mobility (R26.89);Other symptoms and signs involving the nervous system (R29.898)     Time: 4166-0630 PT Time Calculation (min) (ACUTE ONLY): 24 min  Charges:  $Gait Training: 8-22 mins $Neuromuscular Re-education: 8-22 mins                     Mabeline Caras, PT, DPT Acute Rehabilitation Services  Pager [phone] Office [place] 11/14/2017, 9:11 AM

## 2017-11-16 DIAGNOSIS — F319 Bipolar disorder, unspecified: Secondary | ICD-10-CM | POA: Diagnosis not present

## 2017-11-16 DIAGNOSIS — D500 Iron deficiency anemia secondary to blood loss (chronic): Secondary | ICD-10-CM | POA: Diagnosis not present

## 2017-11-16 DIAGNOSIS — I69318 Other symptoms and signs involving cognitive functions following cerebral infarction: Secondary | ICD-10-CM | POA: Diagnosis not present

## 2017-11-16 DIAGNOSIS — D519 Vitamin B12 deficiency anemia, unspecified: Secondary | ICD-10-CM | POA: Diagnosis not present

## 2017-11-16 DIAGNOSIS — N920 Excessive and frequent menstruation with regular cycle: Secondary | ICD-10-CM | POA: Diagnosis not present

## 2017-11-16 DIAGNOSIS — D473 Essential (hemorrhagic) thrombocythemia: Secondary | ICD-10-CM | POA: Diagnosis not present

## 2017-11-16 DIAGNOSIS — I69354 Hemiplegia and hemiparesis following cerebral infarction affecting left non-dominant side: Secondary | ICD-10-CM | POA: Diagnosis not present

## 2017-11-16 DIAGNOSIS — Z7901 Long term (current) use of anticoagulants: Secondary | ICD-10-CM | POA: Diagnosis not present

## 2017-11-16 DIAGNOSIS — D259 Leiomyoma of uterus, unspecified: Secondary | ICD-10-CM | POA: Diagnosis not present

## 2017-11-18 DIAGNOSIS — Z7901 Long term (current) use of anticoagulants: Secondary | ICD-10-CM | POA: Diagnosis not present

## 2017-11-18 DIAGNOSIS — D259 Leiomyoma of uterus, unspecified: Secondary | ICD-10-CM | POA: Diagnosis not present

## 2017-11-18 DIAGNOSIS — D473 Essential (hemorrhagic) thrombocythemia: Secondary | ICD-10-CM | POA: Diagnosis not present

## 2017-11-18 DIAGNOSIS — I69318 Other symptoms and signs involving cognitive functions following cerebral infarction: Secondary | ICD-10-CM | POA: Diagnosis not present

## 2017-11-18 DIAGNOSIS — N920 Excessive and frequent menstruation with regular cycle: Secondary | ICD-10-CM | POA: Diagnosis not present

## 2017-11-18 DIAGNOSIS — D500 Iron deficiency anemia secondary to blood loss (chronic): Secondary | ICD-10-CM | POA: Diagnosis not present

## 2017-11-18 DIAGNOSIS — I69354 Hemiplegia and hemiparesis following cerebral infarction affecting left non-dominant side: Secondary | ICD-10-CM | POA: Diagnosis not present

## 2017-11-18 DIAGNOSIS — D519 Vitamin B12 deficiency anemia, unspecified: Secondary | ICD-10-CM | POA: Diagnosis not present

## 2017-11-18 DIAGNOSIS — F319 Bipolar disorder, unspecified: Secondary | ICD-10-CM | POA: Diagnosis not present

## 2017-11-19 DIAGNOSIS — D519 Vitamin B12 deficiency anemia, unspecified: Secondary | ICD-10-CM | POA: Diagnosis not present

## 2017-11-19 DIAGNOSIS — I69318 Other symptoms and signs involving cognitive functions following cerebral infarction: Secondary | ICD-10-CM | POA: Diagnosis not present

## 2017-11-19 DIAGNOSIS — F319 Bipolar disorder, unspecified: Secondary | ICD-10-CM | POA: Diagnosis not present

## 2017-11-19 DIAGNOSIS — I69354 Hemiplegia and hemiparesis following cerebral infarction affecting left non-dominant side: Secondary | ICD-10-CM | POA: Diagnosis not present

## 2017-11-19 DIAGNOSIS — N920 Excessive and frequent menstruation with regular cycle: Secondary | ICD-10-CM | POA: Diagnosis not present

## 2017-11-19 DIAGNOSIS — D473 Essential (hemorrhagic) thrombocythemia: Secondary | ICD-10-CM | POA: Diagnosis not present

## 2017-11-19 DIAGNOSIS — Z7901 Long term (current) use of anticoagulants: Secondary | ICD-10-CM | POA: Diagnosis not present

## 2017-11-19 DIAGNOSIS — D259 Leiomyoma of uterus, unspecified: Secondary | ICD-10-CM | POA: Diagnosis not present

## 2017-11-19 DIAGNOSIS — D500 Iron deficiency anemia secondary to blood loss (chronic): Secondary | ICD-10-CM | POA: Diagnosis not present

## 2017-11-26 DIAGNOSIS — I63411 Cerebral infarction due to embolism of right middle cerebral artery: Secondary | ICD-10-CM | POA: Diagnosis not present

## 2017-11-26 DIAGNOSIS — D259 Leiomyoma of uterus, unspecified: Secondary | ICD-10-CM | POA: Diagnosis not present

## 2017-11-26 DIAGNOSIS — D649 Anemia, unspecified: Secondary | ICD-10-CM | POA: Diagnosis not present

## 2017-11-26 DIAGNOSIS — E539 Vitamin B deficiency, unspecified: Secondary | ICD-10-CM | POA: Diagnosis not present

## 2017-11-26 DIAGNOSIS — I10 Essential (primary) hypertension: Secondary | ICD-10-CM | POA: Diagnosis not present

## 2017-11-28 DIAGNOSIS — I63413 Cerebral infarction due to embolism of bilateral middle cerebral arteries: Secondary | ICD-10-CM | POA: Diagnosis not present

## 2017-12-02 DIAGNOSIS — Z7901 Long term (current) use of anticoagulants: Secondary | ICD-10-CM | POA: Diagnosis not present

## 2017-12-02 DIAGNOSIS — F319 Bipolar disorder, unspecified: Secondary | ICD-10-CM | POA: Diagnosis not present

## 2017-12-02 DIAGNOSIS — N920 Excessive and frequent menstruation with regular cycle: Secondary | ICD-10-CM | POA: Diagnosis not present

## 2017-12-02 DIAGNOSIS — D500 Iron deficiency anemia secondary to blood loss (chronic): Secondary | ICD-10-CM | POA: Diagnosis not present

## 2017-12-02 DIAGNOSIS — I69318 Other symptoms and signs involving cognitive functions following cerebral infarction: Secondary | ICD-10-CM | POA: Diagnosis not present

## 2017-12-02 DIAGNOSIS — I69354 Hemiplegia and hemiparesis following cerebral infarction affecting left non-dominant side: Secondary | ICD-10-CM | POA: Diagnosis not present

## 2017-12-02 DIAGNOSIS — D519 Vitamin B12 deficiency anemia, unspecified: Secondary | ICD-10-CM | POA: Diagnosis not present

## 2017-12-02 DIAGNOSIS — D259 Leiomyoma of uterus, unspecified: Secondary | ICD-10-CM | POA: Diagnosis not present

## 2017-12-02 DIAGNOSIS — D473 Essential (hemorrhagic) thrombocythemia: Secondary | ICD-10-CM | POA: Diagnosis not present

## 2017-12-03 DIAGNOSIS — E539 Vitamin B deficiency, unspecified: Secondary | ICD-10-CM | POA: Diagnosis not present

## 2017-12-03 DIAGNOSIS — J4522 Mild intermittent asthma with status asthmaticus: Secondary | ICD-10-CM | POA: Diagnosis not present

## 2017-12-03 DIAGNOSIS — I10 Essential (primary) hypertension: Secondary | ICD-10-CM | POA: Diagnosis not present

## 2017-12-03 DIAGNOSIS — I63411 Cerebral infarction due to embolism of right middle cerebral artery: Secondary | ICD-10-CM | POA: Diagnosis not present

## 2017-12-04 DIAGNOSIS — N920 Excessive and frequent menstruation with regular cycle: Secondary | ICD-10-CM | POA: Diagnosis not present

## 2017-12-04 DIAGNOSIS — Z7901 Long term (current) use of anticoagulants: Secondary | ICD-10-CM | POA: Diagnosis not present

## 2017-12-04 DIAGNOSIS — I69354 Hemiplegia and hemiparesis following cerebral infarction affecting left non-dominant side: Secondary | ICD-10-CM | POA: Diagnosis not present

## 2017-12-04 DIAGNOSIS — D473 Essential (hemorrhagic) thrombocythemia: Secondary | ICD-10-CM | POA: Diagnosis not present

## 2017-12-04 DIAGNOSIS — D519 Vitamin B12 deficiency anemia, unspecified: Secondary | ICD-10-CM | POA: Diagnosis not present

## 2017-12-04 DIAGNOSIS — D500 Iron deficiency anemia secondary to blood loss (chronic): Secondary | ICD-10-CM | POA: Diagnosis not present

## 2017-12-04 DIAGNOSIS — F319 Bipolar disorder, unspecified: Secondary | ICD-10-CM | POA: Diagnosis not present

## 2017-12-04 DIAGNOSIS — D259 Leiomyoma of uterus, unspecified: Secondary | ICD-10-CM | POA: Diagnosis not present

## 2017-12-04 DIAGNOSIS — I69318 Other symptoms and signs involving cognitive functions following cerebral infarction: Secondary | ICD-10-CM | POA: Diagnosis not present

## 2017-12-05 DIAGNOSIS — F319 Bipolar disorder, unspecified: Secondary | ICD-10-CM | POA: Diagnosis not present

## 2017-12-05 DIAGNOSIS — N920 Excessive and frequent menstruation with regular cycle: Secondary | ICD-10-CM | POA: Diagnosis not present

## 2017-12-05 DIAGNOSIS — Z7901 Long term (current) use of anticoagulants: Secondary | ICD-10-CM | POA: Diagnosis not present

## 2017-12-05 DIAGNOSIS — I69354 Hemiplegia and hemiparesis following cerebral infarction affecting left non-dominant side: Secondary | ICD-10-CM | POA: Diagnosis not present

## 2017-12-05 DIAGNOSIS — D519 Vitamin B12 deficiency anemia, unspecified: Secondary | ICD-10-CM | POA: Diagnosis not present

## 2017-12-05 DIAGNOSIS — I69318 Other symptoms and signs involving cognitive functions following cerebral infarction: Secondary | ICD-10-CM | POA: Diagnosis not present

## 2017-12-05 DIAGNOSIS — D500 Iron deficiency anemia secondary to blood loss (chronic): Secondary | ICD-10-CM | POA: Diagnosis not present

## 2017-12-05 DIAGNOSIS — D473 Essential (hemorrhagic) thrombocythemia: Secondary | ICD-10-CM | POA: Diagnosis not present

## 2017-12-05 DIAGNOSIS — D259 Leiomyoma of uterus, unspecified: Secondary | ICD-10-CM | POA: Diagnosis not present

## 2017-12-06 DIAGNOSIS — F319 Bipolar disorder, unspecified: Secondary | ICD-10-CM | POA: Diagnosis not present

## 2017-12-06 DIAGNOSIS — I69318 Other symptoms and signs involving cognitive functions following cerebral infarction: Secondary | ICD-10-CM | POA: Diagnosis not present

## 2017-12-06 DIAGNOSIS — N920 Excessive and frequent menstruation with regular cycle: Secondary | ICD-10-CM | POA: Diagnosis not present

## 2017-12-06 DIAGNOSIS — D259 Leiomyoma of uterus, unspecified: Secondary | ICD-10-CM | POA: Diagnosis not present

## 2017-12-06 DIAGNOSIS — D500 Iron deficiency anemia secondary to blood loss (chronic): Secondary | ICD-10-CM | POA: Diagnosis not present

## 2017-12-06 DIAGNOSIS — D473 Essential (hemorrhagic) thrombocythemia: Secondary | ICD-10-CM | POA: Diagnosis not present

## 2017-12-06 DIAGNOSIS — I69354 Hemiplegia and hemiparesis following cerebral infarction affecting left non-dominant side: Secondary | ICD-10-CM | POA: Diagnosis not present

## 2017-12-06 DIAGNOSIS — D519 Vitamin B12 deficiency anemia, unspecified: Secondary | ICD-10-CM | POA: Diagnosis not present

## 2017-12-06 DIAGNOSIS — Z7901 Long term (current) use of anticoagulants: Secondary | ICD-10-CM | POA: Diagnosis not present

## 2017-12-10 DIAGNOSIS — Z7901 Long term (current) use of anticoagulants: Secondary | ICD-10-CM | POA: Diagnosis not present

## 2017-12-10 DIAGNOSIS — I69354 Hemiplegia and hemiparesis following cerebral infarction affecting left non-dominant side: Secondary | ICD-10-CM | POA: Diagnosis not present

## 2017-12-10 DIAGNOSIS — D500 Iron deficiency anemia secondary to blood loss (chronic): Secondary | ICD-10-CM | POA: Diagnosis not present

## 2017-12-10 DIAGNOSIS — D259 Leiomyoma of uterus, unspecified: Secondary | ICD-10-CM | POA: Diagnosis not present

## 2017-12-10 DIAGNOSIS — N920 Excessive and frequent menstruation with regular cycle: Secondary | ICD-10-CM | POA: Diagnosis not present

## 2017-12-10 DIAGNOSIS — D519 Vitamin B12 deficiency anemia, unspecified: Secondary | ICD-10-CM | POA: Diagnosis not present

## 2017-12-10 DIAGNOSIS — F319 Bipolar disorder, unspecified: Secondary | ICD-10-CM | POA: Diagnosis not present

## 2017-12-10 DIAGNOSIS — I69318 Other symptoms and signs involving cognitive functions following cerebral infarction: Secondary | ICD-10-CM | POA: Diagnosis not present

## 2017-12-10 DIAGNOSIS — D473 Essential (hemorrhagic) thrombocythemia: Secondary | ICD-10-CM | POA: Diagnosis not present

## 2017-12-17 DIAGNOSIS — I69354 Hemiplegia and hemiparesis following cerebral infarction affecting left non-dominant side: Secondary | ICD-10-CM | POA: Diagnosis not present

## 2017-12-17 DIAGNOSIS — N920 Excessive and frequent menstruation with regular cycle: Secondary | ICD-10-CM | POA: Diagnosis not present

## 2017-12-17 DIAGNOSIS — F319 Bipolar disorder, unspecified: Secondary | ICD-10-CM | POA: Diagnosis not present

## 2017-12-17 DIAGNOSIS — D500 Iron deficiency anemia secondary to blood loss (chronic): Secondary | ICD-10-CM | POA: Diagnosis not present

## 2017-12-17 DIAGNOSIS — D259 Leiomyoma of uterus, unspecified: Secondary | ICD-10-CM | POA: Diagnosis not present

## 2017-12-17 DIAGNOSIS — D473 Essential (hemorrhagic) thrombocythemia: Secondary | ICD-10-CM | POA: Diagnosis not present

## 2017-12-17 DIAGNOSIS — D519 Vitamin B12 deficiency anemia, unspecified: Secondary | ICD-10-CM | POA: Diagnosis not present

## 2017-12-17 DIAGNOSIS — Z7901 Long term (current) use of anticoagulants: Secondary | ICD-10-CM | POA: Diagnosis not present

## 2017-12-17 DIAGNOSIS — I69318 Other symptoms and signs involving cognitive functions following cerebral infarction: Secondary | ICD-10-CM | POA: Diagnosis not present

## 2017-12-18 ENCOUNTER — Telehealth: Payer: Self-pay | Admitting: Adult Health

## 2017-12-18 ENCOUNTER — Encounter: Payer: Self-pay | Admitting: Adult Health

## 2017-12-18 ENCOUNTER — Ambulatory Visit: Payer: Medicaid Other | Admitting: Adult Health

## 2017-12-18 VITALS — BP 152/103 | HR 86 | Ht 63.0 in | Wt 169.0 lb

## 2017-12-18 DIAGNOSIS — I63311 Cerebral infarction due to thrombosis of right middle cerebral artery: Secondary | ICD-10-CM | POA: Diagnosis not present

## 2017-12-18 DIAGNOSIS — D509 Iron deficiency anemia, unspecified: Secondary | ICD-10-CM

## 2017-12-18 MED ORDER — ALPRAZOLAM 0.5 MG PO TABS: 0.2500 mg | ORAL_TABLET | Freq: Three times a day (TID) | ORAL | 0 refills | Status: DC | PRN

## 2017-12-18 NOTE — Progress Notes (Signed)
[name] [address] [place]. [place] [address] [phone]       OFFICE FOLLOW UP NOTE  [name] Date of Birth:  Nov 01, 1975 Medical Record Number:  [id number]   Reason for Referral:  hospital stroke follow up  CHIEF COMPLAINT:  Chief Complaint  Patient presents with  . 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 History obtained from patient, partner and chart review. Reviewed all radiology images and labs personally.  [name] is a 42 y.o. female with history of TIA, depression, anemia of unknown cause and bipolar who was admitted for left sided weakness, numbness, and right eye vision loss. No tPA given due to outside window.    CT head reviewed and was negative for acute infarct.  MRI brain reviewed and showed right MCA and ACA and right caudate head, BG/CR infarcts.  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  2D echo showed an EF of 60 to 65%.  Lower extremity ultrasound Dopplers negative for DVT.  Bilateral carotid Doppler showed bilateral ICA stenosis of 40 to 59% with VAS antegrade.  Recommendation starting IV heparin after review of prior medical records along with transfusion of 2 units PRBC due to Hgb 6.  Lab work showed evidence of severe microcytic anemia secondary to iron deficiency.  TEE negative for embolic source or PFO with EF of 60 to 65%.  A1c 5.6 and LDL 5.  She was subsequently started on Coumadin due to evidence of thrombosis.  Recommended outpatient follow-up with repeat CTA neck in 2 to 3 months and if carotid thrombus resolves anticoagulation can be stopped at that time and  transition to aspirin.  Was found to have elevated homocystine level at 35.3 and recommended started on vitamin B6 in addition to home medications of folic acid and V61 supplement.  It was felt as though thrombocytosis was reactive in setting of iron deficiency and was slowly improving with iron supplementation and recommended follow-up with hematology for evaluation for genetic mutations possibly related to essential thrombocythemia.  It was determined as infarcts were embolic secondary to right distal CCA/proximal ICA thrombus rupture with unclear etiology however felt as though this could be due to carotid dissection versus thrombocythemia.  Patient was discharged home with recommendation of home health PT/OT.  Patient is being seen today for hospital follow-up and is accompanied by her partner.  She states she does continue to have left hemiparesis but this has been improving along with intermittent dizziness/balance issues.  She does use a cane to assist with walking.  Patient continues to participate in home PT/OT but this will be finishing up this week and is requesting continued outpatient PT/OT as recommended by current home therapist.  She also continues to have right eye blurriness but this is also been improving.  She has been unable to wear her corrective lenses as it has currently been making her blurriness worse.  She has not seen ophthalmologist at this time as she is waiting to see hopeful continued improvement prior to ophthalmologist evaluation as she does not want her prescription lenses changed if her vision continues to improve.  She has not returned to driving or work at this time.  She does state that when she is in a over populated area with increased stimulation, she starts to experience symptoms of anxiety such as a "closing in sensation" where all voice has become muffled with a ringing sensation in her ears along with worsening of left hemiparesis and dizziness and a temporal frontal  headache associated with photophobia and phonophobia.  Once this happens, she will need to go home take Tylenol for her headache and sit in a dark quiet area which typically will help.  This does limit her activity during the day along with a fear of returning to work as she previously was working at [employer].  Patient does have a history of bipolar but is not currently on any treatment medications.  She denies any of the symptoms while she is at home or in areas without increased stimulation.  She has not had any complications with bipolar while being off of her medications prior to her stroke.  She has continued to take Coumadin without side effects of bleeding or bruising.  She does state that she was seen by her PCP recently who did lab work but is unable to state exactly what lab work was done.  She plans on following up with her gynecologist soon but has not done so since discharge.  Blood pressure today mildly elevated at 152/103 but as patient does monitor this at home, typical SBP 1 30-1 40/90s.  No further concerns at this time.  Denies new or worsening stroke/TIA symptoms.   ROS:   14 system review of systems performed and negative with exception of weight gain, ringing in ears, trouble swallowing, blurry vision, cough, constipation and easy bleeding  PMH:  Past Medical History:  Diagnosis Date  . Bipolar 1 disorder ([place])   . Complication of anesthesia   . Depression   . Hypertension   . PONV (postoperative nausea and vomiting)   . Stroke [facility])     PSH:  Past Surgical History:  Procedure Laterality Date  . CESAREAN SECTION     X2  . HERNIA REPAIR    . TEE WITHOUT CARDIOVERSION N/A 11/13/2017   Procedure: TRANSESOPHAGEAL ECHOCARDIOGRAM (TEE) bubble study;  Surgeon: Skeet Latch, MD;  Location: [facility];  Service: Cardiovascular;  Laterality: N/A;    Social History:  Social History   Socioeconomic History  . Marital status: Married    Spouse name: Not on file  .  Number of children: Not on file  . Years of education: Not on file  . Highest education level: Not on file  Occupational History  . Not on file  Social Needs  . Financial resource strain: Not on file  . Food insecurity:    Worry: Not on file    Inability: Not on file  . Transportation needs:    Medical: Not on file    Non-medical: Not on file  Tobacco Use  . Smoking status: Never Smoker  . Smokeless tobacco: Never Used  Substance and Sexual Activity  . Alcohol use: Yes    Comment: Occasional  . Drug use: No  . Sexual activity: Yes    Birth control/protection: None  Lifestyle  . Physical activity:    Days per week: Not on file    Minutes per session: Not on file  . Stress: Not on file  Relationships  . Social connections:    Talks on phone: Not on file    Gets together: Not  on file    Attends religious service: Not on file    Active member of club or organization: Not on file    Attends meetings of clubs or organizations: Not on file    Relationship status: Not on file  . Intimate partner violence:    Fear of current or ex partner: Not on file    Emotionally abused: Not on file    Physically abused: Not on file    Forced sexual activity: Not on file  Other Topics Concern  . Not on file  Social History Narrative  . Not on file    Family History:  Family History  Problem Relation Age of Onset  . Hypertension Mother   . Stroke Father   . Stroke Maternal Aunt     Medications:   Current Outpatient Medications on File Prior to Visit  Medication Sig Dispense Refill  . cyanocobalamin (,VITAMIN B-12,) 1000 MCG/ML injection INJECT 1 ML INTO THE MUSCLE EVERY 2 WEEKS AS DIRECTED  5  . ferrous sulfate 325 (65 FE) MG tablet Take 1 tablet (325 mg total) by mouth 2 (two) times daily with a meal. 60 tablet 0  . folic acid (FOLVITE) 1 MG tablet Take 2 tablets (2 mg total) by mouth daily. 30 tablet 0  . hydrochlorothiazide (HYDRODIURIL) 12.5 MG tablet Take 12.5 mg by mouth  daily.  2  . megestrol (MEGACE) 40 MG tablet Take 1 tablet (40 mg total) by mouth 2 (two) times daily. 60 tablet 0  . pantoprazole (PROTONIX) 40 MG tablet TAKE 1 TABLET BY MOUTH EVERY DAY IN THE MORNING  2  . pyridOXINE (B-6) 50 MG tablet Take 1 tablet (50 mg total) by mouth daily. 30 tablet 0  . warfarin (COUMADIN) 7.5 MG tablet Take 1 tablet (7.5 mg total) by mouth one time only at 6 PM. 30 tablet 0   No current facility-administered medications on file prior to visit.     Allergies:   Allergies  Allergen Reactions  . Food Itching and Swelling    Melons  . Penicillins Other (See Comments)    Spots and made hair fall out     Physical Exam  Vitals:   12/18/17 0833  BP: (!) 152/103  Pulse: 86  Weight: 169 lb (76.7 kg)  Height: 5\' 3"  (1.6 m)   Body mass index is 29.94 kg/m. No exam data present  General: well developed, well nourished, pleasant middle-aged African-American female, seated, in no evident distress Head: head normocephalic and atraumatic.   Neck: supple with no carotid or supraclavicular bruits Cardiovascular: regular rate and rhythm, no murmurs Musculoskeletal: no deformity Skin:  no rash/petichiae Vascular:  Normal pulses all extremities  Neurologic Exam Mental Status: Awake and fully alert. Oriented to place and time. Recent and remote memory intact. Attention span, concentration and fund of knowledge appropriate. Mood and affect appropriate.  Cranial Nerves: Fundoscopic exam reveals sharp disc margins. Pupils equal, briskly reactive to light. Extraocular movements full without nystagmus. Visual fields full to confrontation.  Subjective blurriness from right eye only.  Hearing intact. Facial sensation intact. Face, tongue, palate moves normally and symmetrically.  Motor: Normal bulk and tone.  LUE: 4/5; LLE: 4+/5 with weak ankle dorsiflexion Sensory.: intact to touch , pinprick , position and vibratory sensation.  Coordination: Rapid alternating movements  normal and right upper extremity. Finger-to-nose and heel-to-shin performed accurately bilaterally.  Orbits right arm over left arm.  Decreased left finger dexterity. Gait and Station: Arises from chair without difficulty. Stance  is normal. Gait demonstrates normal stride length and balance . Able to heel, toe and tandem walk with moderate difficulty.  Reflexes: 1+ and symmetric. Toes downgoing.    NIHSS  0 Modified Rankin  2    Diagnostic Data (Labs, Imaging, Testing)  CT HEAD WO CONTRAST 11/10/2017 IMPRESSION: 1. Negative non-contrast CT HEAD. 2. ASPECTS is 10.  CT ANGIO HEAD W OR WO CONTRAST CT ANGIO NECK W OR WO CONTRAST 11/10/2017 IMPRESSION: CTA NECK: 1. RIGHT carotid bulb intramural hematoma, less likely atypical fibromuscular dysplasia. No flow-limiting stenosis. 2. Patent vertebral arteries. CTA HEAD: 1.  No emergent large vessel occlusion or flow-limiting stenosis. 2. Mild intracranial atherosclerosis, less likely vasculopathy.  CT ANGIO HEAD W OR WO CONTRAST CT ANGIO NECK W OR WO CONTRAST CT Cerebral PERFUSION W CONTRAST 11/11/2017 IMPRESSION: 1. Patent carotid and vertebral arteries. No dissection, aneurysm, or hemodynamically significant stenosis utilizing NASCET criteria. 2. Focal posterior wall thickening of the right proximal ICA is less pronounced on the current study than on the prior study. Findings may represent a soft plaque rupture. Additionally, given neck being held in flexion on the prior study, this finding may have represented a vessel kink. 3. Stable patent anterior and posterior intracranial circulation. No large vessel occlusion, aneurysm, or significant stenosis. 4. Stable negative noncontrast CT of the head. 5. No acute stroke or ischemic penumbra by CT perfusion criteria. There are a few foci of T-max greater than 4 seconds in the right MCA distribution which may reflect small vessel ischemic changes.  MR BRAIN WO  CONTRAST 11/11/2017 IMPRESSION: Several scattered subcentimeter foci of acute/early subacute infarction throughout the right MCA distribution. No associated hemorrhage or mass effect.  VAS US CAROTID DUPLEX BILATERAL 11/12/2017 Final Interpretation: Right Carotid: Velocities in the right ICA are consistent with a 40-59%        stenosis. Velocity may vary due to tortuosity. Left Carotid: Velocities in the left ICA are consistent with a 40-59% stenosis.       Velocity may vary due to tortuosity. Vertebrals: Bilateral vertebral arteries demonstrate antegrade flow.  ECHOCARDIOGRAM 11/11/2017 Study Conclusions - Left ventricle: The cavity size was normal. Systolic function was   normal. The estimated ejection fraction was in the range of 60%   to 65%. Wall motion was normal; there were no regional wall   motion abnormalities. Left ventricular diastolic function   parameters were normal for the patient&'s age.  ECHO TEE 11/13/2017 Study Conclusions - Left ventricle: Systolic function was normal. The estimated   ejection fraction was in the range of 60% to 65%. Wall motion was   normal; there were no regional wall motion abnormalities. - Aortic valve: There was trivial regurgitation. - Mitral valve: There was mild regurgitation. - Left atrium: No evidence of thrombus in the atrial cavity or   appendage. - Right ventricle: The cavity size was normal. Wall thickness was   normal. Systolic function was normal. - Right atrium: No evidence of thrombus in the atrial cavity or   appendage. - Atrial septum: No defect or patent foramen ovale was identified   by color flow Doppler or saline microcavitation study. - Pericardium, extracardiac: A trivial pericardial effusion was   identified.  VAS [place] LOWER EXTREMITY VENOUS BILATERAL 11/12/2017 Final Interpretation: Right: There is no evidence of deep vein thrombosis in the lower extremity. No cystic structure found in the  popliteal fossa. Left: There is no evidence of deep vein thrombosis in the lower extremity. No cystic structure found in the popliteal  fossa.    ASSESSMENT: NOELIA LENART is a 42 y.o. 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 Vascular risk factors include prior TIA, depression, iron deficiency anemia, significant thrombocythemia during admission, and HTN.     PLAN: -Continue warfarin daily for secondary stroke prevention -Repeat lab work today and requesting results from PCP      -CBC      -Pro time-INR      -Ferritin      -Iron and TIBC      -Homocystine      -B12  -Depending on lab results, may need referral to hematology/oncology for further work-up of thrombocythemia -Advised to schedule appointment with [facility] for follow-up -Start Xanax 0.25 mg 3 times daily as needed due to increased anxiety with increased stimulation that worsens deficits -Consider headache treatment in the future if needed -Referral placed for outpatient PT/OT for continued deficits -CTA head/neck for repeat imaging to assess thrombosis -if thrombosis resolved, we will stop warfarin at that time and transition to aspirin for secondary stroke prevention -F/u with PCP regarding your HTN management -continue to monitor BP at home -advised to continue to stay active and maintain a healthy diet -Maintain strict control of hypertension with blood pressure goal below 130/90, diabetes with hemoglobin A1c goal below 6.5% and cholesterol with LDL cholesterol (bad cholesterol) goal below 70 mg/dL. I also advised the patient to eat a healthy diet with plenty of whole grains, cereals, fruits and vegetables, exercise regularly and maintain ideal body weight.  Follow up in 3 months or call earlier if needed   Greater than 50% of  time during this 25 minute visit was spent on counseling,explanation of diagnosis of multiple embolic infarcts, reviewing risk factor management of ICA dissection versus thrombosis, iron deficiency anemia, thrombocythemia, HTN and prior TIA, and anxiety, planning of further management, discussion with patient and family and coordination of care    Venancio Poisson, AGNP-BC  [facility] [address] [place], [place] [address]  Phone [phone] Fax [phone] Note: This document was prepared with digital dictation and possible smart phrase technology. Any transcriptional errors that result from this process are unintentional.

## 2017-12-18 NOTE — Patient Instructions (Signed)
Continue warfarin daily for secondary stroke prevention  We will do lab work today and depending on results, may need to refer you to hematologist   Please schedule appointment with gyno for follow up  Start xanax 0.25mg  as needed to use prior to going out into public places. If this does not help, please let me know and we can discuss further management   You will be called to scheduled CT scan to assess clot - if resolved, we will talk about stopping warfarin   Referral placed for PT/OT. If you do not hear from them by end of next week, please call at [phone]  Continue to follow up with PCP regarding cholesterol and blood pressure management   Continue to monitor blood pressure at home  Maintain strict control of hypertension with blood pressure goal below 130/90, diabetes with hemoglobin A1c goal below 6.5% and cholesterol with LDL cholesterol (bad cholesterol) goal below 70 mg/dL. I also advised the patient to eat a healthy diet with plenty of whole grains, cereals, fruits and vegetables, exercise regularly and maintain ideal body weight.  Followup in the future with me in 3 months or call earlier if needed       Thank you for coming to see [place] at [facility]. I hope we have been able to provide you high quality care today.  You may receive a patient satisfaction survey over the next few weeks. We would appreciate your feedback and comments so that we may continue to improve ourselves and the health of our patients.

## 2017-12-18 NOTE — Telephone Encounter (Signed)
lvm for pt to be aware of this. Left GI number of [phone] and to call them if she has not heard in the next 2-3 business days.

## 2017-12-18 NOTE — Telephone Encounter (Signed)
Obtain lab results from PCP office as requested.  INR 1.4   11/26/2017 (Coumadin dose adjusted) 2.1    11/28/2017 5.1    12/04/2017 (Coumadin dose adjusted)  Prothrombin time 13.8   11/26/2017 20.7   11/28/2017 47.9   12/04/2017    11/26/2017  CBC WBC 7.4 RBC 4.95 Hgb 11.8 HCT 37 MCV 74.7 MCH 23 MCHC 30.8 Platelet 96  Sickle cell screen Negative  BMP All WNL  TSH 1.82

## 2017-12-18 NOTE — Telephone Encounter (Signed)
medicaid order sent to GI please schedule end of Nov Beg. Dec they will reach out to the pt to schedule.

## 2017-12-19 LAB — FERRITIN: Ferritin: 68 ng/mL (ref 15–150)

## 2017-12-19 LAB — CBC WITH DIFFERENTIAL/PLATELET
Basophils Absolute: 0.1 10*3/uL (ref 0.0–0.2)
Basos: 1 %
EOS (ABSOLUTE): 0.1 10*3/uL (ref 0.0–0.4)
EOS: 1 %
HEMATOCRIT: 43.3 % (ref 34.0–46.6)
HEMOGLOBIN: 13.2 g/dL (ref 11.1–15.9)
IMMATURE GRANULOCYTES: 0 %
Immature Grans (Abs): 0 10*3/uL (ref 0.0–0.1)
LYMPHS ABS: 1.8 10*3/uL (ref 0.7–3.1)
Lymphs: 22 %
MCH: 24.7 pg — AB (ref 26.6–33.0)
MCHC: 30.5 g/dL — AB (ref 31.5–35.7)
MCV: 81 fL (ref 79–97)
MONOS ABS: 0.6 10*3/uL (ref 0.1–0.9)
Monocytes: 7 %
Neutrophils Absolute: 5.7 10*3/uL (ref 1.4–7.0)
Neutrophils: 69 %
PLATELETS: 446 10*3/uL (ref 150–450)
RBC: 5.34 x10E6/uL — AB (ref 3.77–5.28)
WBC: 8.2 10*3/uL (ref 3.4–10.8)

## 2017-12-19 LAB — HOMOCYSTEINE: HOMOCYSTEINE: 10.7 umol/L (ref 0.0–15.0)

## 2017-12-19 LAB — IRON AND TIBC
IRON SATURATION: 30 % (ref 15–55)
IRON: 110 ug/dL (ref 27–159)
TIBC: 370 ug/dL (ref 250–450)
UIBC: 260 ug/dL (ref 131–425)

## 2017-12-19 LAB — PROTIME-INR
INR: 1.2 (ref 0.8–1.2)
PROTHROMBIN TIME: 12.6 s — AB (ref 9.1–12.0)

## 2017-12-19 LAB — VITAMIN B12: VITAMIN B 12: 623 pg/mL (ref 232–1245)

## 2017-12-22 NOTE — Progress Notes (Signed)
I agree with the above plan 

## 2017-12-30 ENCOUNTER — Encounter: Payer: Self-pay | Admitting: General Practice

## 2017-12-30 ENCOUNTER — Encounter: Payer: Self-pay | Admitting: Obstetrics and Gynecology

## 2017-12-30 NOTE — Progress Notes (Unsigned)
Patient no showed for appt today- per Dr Rosana Hoes, patient can reschedule on her own.

## 2018-01-06 ENCOUNTER — Ambulatory Visit: Payer: Medicaid Other | Attending: Internal Medicine | Admitting: Physical Therapy

## 2018-01-06 NOTE — Telephone Encounter (Signed)
Patient called and stated that she has not been contacted for her MRI. I relayed the message below to her and gave her the number to [place] again. She stated that she was going to call.

## 2018-01-06 NOTE — Telephone Encounter (Signed)
No need to return call.

## 2018-01-06 NOTE — Telephone Encounter (Signed)
Patient is scheduled at GI for 02/11/18.

## 2018-01-09 ENCOUNTER — Telehealth: Payer: Self-pay | Admitting: Adult Health

## 2018-01-09 NOTE — Telephone Encounter (Signed)
Pt is calling for a refill on her ALPRAZolam (XANAX) 0.5 MG tablet [id number] - [place], [place] - [address]

## 2018-01-10 NOTE — Telephone Encounter (Signed)
Rn call patient back about needing a refill on Xanax. Pt stated she is back at work now full time. She call because her insurance will be changing soon and wanted to know about future refills. Pt stated she has some 20 pills left over. She has been cutting the pill in half to take 0.25 mg for anxiety. RN stated if she was out it could not be refill till 01/15/2018. Rn stated per JEssica Np and last office visit it was discuss she schedule an appt with her PCP about future refills, and management. Rn advise pt to call her PCP and make an appt. She will need to discuss with him doing next refill on xanax, and her anxiety during social events.PT verbalized understanding.

## 2018-01-10 NOTE — Telephone Encounter (Signed)
Please advise patient that this prescription was sent in as she was having worsening symptoms during social situations. If she was taking the Xanax 0.25mg  (0.5 tabs) three times daily it should last until 11/27. She should have only had to use this as needed during social situations up to 3 times a day but if she has been taking three times daily, please advise her to follow up with her PCP in regards to anxiety treatment as this was solely prescribed to prevent worsening of symptoms due to recent stroke during social situations. Either way, it is too early to refill. Thank you

## 2018-01-10 NOTE — Telephone Encounter (Signed)
Noted! Thank you

## 2018-02-11 ENCOUNTER — Ambulatory Visit
Admission: RE | Admit: 2018-02-11 | Discharge: 2018-02-11 | Disposition: A | Discharge status: Home / Self Care | Payer: Medicaid Other | Source: Ambulatory Visit | Attending: Adult Health | Admitting: Adult Health

## 2018-02-11 DIAGNOSIS — I63311 Cerebral infarction due to thrombosis of right middle cerebral artery: Secondary | ICD-10-CM

## 2018-02-11 DIAGNOSIS — I639 Cerebral infarction, unspecified: Secondary | ICD-10-CM | POA: Diagnosis not present

## 2018-02-11 MED ORDER — IOPAMIDOL (ISOVUE-370) INJECTION 76%
75.0000 mL | Freq: Once | INTRAVENOUS | Status: AC | PRN
  Administered 2018-02-11: 75 mL via INTRAVENOUS

## 2018-02-13 ENCOUNTER — Other Ambulatory Visit: Payer: Self-pay | Admitting: Adult Health

## 2018-02-13 ENCOUNTER — Telehealth: Payer: Self-pay

## 2018-02-13 MED ORDER — ASPIRIN 81 MG PO TABS: 81.0000 mg | ORAL_TABLET | Freq: Every day | ORAL | Status: DC

## 2018-02-13 NOTE — Telephone Encounter (Signed)
Spoke with the patient and she verbalized understanding her results. No questions or concerns at this time.   

## 2018-02-13 NOTE — Telephone Encounter (Signed)
-----   Message from Venancio Poisson, NP sent at 02/13/2018  7:00 AM EST ----- Please advise patient that her recent imaging showed resolution of previously seen clot therefore it is recommended to discontinue Coumadin at this time and continue on aspirin 81 mg only.

## 2018-02-17 NOTE — Telephone Encounter (Signed)
Medicaid Josem Kaufmann: [id number] (exp. 02/04/18 to 03/06/18)

## 2018-03-17 ENCOUNTER — Ambulatory Visit: Payer: Medicaid Other | Admitting: Adult Health

## 2018-03-17 ENCOUNTER — Telehealth: Payer: Self-pay

## 2018-03-17 NOTE — Telephone Encounter (Signed)
Patient was a no call/no show for their appointment today.   

## 2018-03-17 NOTE — Progress Notes (Deleted)
[name] [address] [place]. [place] [address] [phone]       OFFICE FOLLOW UP NOTE  Mallory Robinson Date of Birth:  1976-01-05 Medical Record Number:  [id number]   Reason for Referral: stroke follow up  CHIEF COMPLAINT:  No chief complaint on file.   HPI:  Mallory Robinson is a 43 y.o. 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  PMH of TIA, depression, anemia of unknown cause.  She initially presented to ED with left-sided weakness, numbness and right eye visual loss.  CTA head and neck did not show evidence of right distal CCA/proximal ICA thrombosis with subsequent rupture and was initiated on Coumadin.  Was also found to have elevated homocystine level at 35.3 and initiated vitamin B6 along with continuation of folic acid and U13 supplement.  Thrombocytosis likely reactive in setting of iron deficiency and evidence of improvement with iron supplementation.  Patient was discharged home with home health PT/OT.  All imaging in detail provided below and personally reviewed.  She was initially evaluated in this office for hospital follow-up on 12/18/2017 with continued left hemiparesis, intermittent dizziness/balance issues and right eye blurriness.  She did endorse worsening of symptoms with anxiety provoking situations such as crowded areas or increased ambulation along with temporal/frontal headache.  Mallory Robinson returns today for 59-month follow-up visit.  She did have repeat CTA head/neck on 02/11/2018 which showed resolution of prior thrombus and therefore Coumadin was discontinued and continued on aspirin 81 mg for secondary stroke prevention.  Repeat lab work obtained after prior visit on 12/18/2017 and showed improvement of her anemia and thrombocythemia and decreased homocystine  level.  It was recommended for her PCP to continue to follow her labs.  Overall, patient has been stable from a neurological/stroke standpoint.  Continue deficits include ***. She has returned to work at this time ***.  She continues on aspirin 81 mg without side effects of bleeding or bruising.  Blood pressure today ***.  Denies new or worsening stroke/TIA symptoms.   ROS:   14 system review of systems performed and negative with exception of weight gain, ringing in ears, trouble swallowing, blurry vision, cough, constipation and easy bleeding  PMH:  Past Medical History:  Diagnosis Date  . Bipolar 1 disorder ([place])   . Complication of anesthesia   . Depression   . Hypertension   . PONV (postoperative nausea and vomiting)   . Stroke [facility])     PSH:  Past Surgical History:  Procedure Laterality Date  . CESAREAN SECTION     X2  . HERNIA REPAIR    . TEE WITHOUT CARDIOVERSION N/A 11/13/2017   Procedure: TRANSESOPHAGEAL ECHOCARDIOGRAM (TEE) bubble study;  Surgeon: Skeet Latch, MD;  Location: [facility];  Service: Cardiovascular;  Laterality: N/A;    Social History:  Social History   Socioeconomic History  . Marital status: Married    Spouse name: Not on file  . Number of children: Not on file  . Years of education: Not on file  . Highest education level: Not on file  Occupational History  . Not on file  Social Needs  . Financial resource strain: Not on file  . Food insecurity:    Worry: Not on file    Inability: Not on file  . Transportation needs:    Medical: Not  on file    Non-medical: Not on file  Tobacco Use  . Smoking status: Never Smoker  . Smokeless tobacco: Never Used  Substance and Sexual Activity  . Alcohol use: Yes    Comment: Occasional  . Drug use: No  . Sexual activity: Yes    Birth control/protection: None  Lifestyle  . Physical activity:    Days per week: Not on file    Minutes per session: Not on file  . Stress: Not on file  Relationships    . Social connections:    Talks on phone: Not on file    Gets together: Not on file    Attends religious service: Not on file    Active member of club or organization: Not on file    Attends meetings of clubs or organizations: Not on file    Relationship status: Not on file  . Intimate partner violence:    Fear of current or ex partner: Not on file    Emotionally abused: Not on file    Physically abused: Not on file    Forced sexual activity: Not on file  Other Topics Concern  . Not on file  Social History Narrative  . Not on file    Family History:  Family History  Problem Relation Age of Onset  . Hypertension Mother   . Stroke Father   . Stroke Maternal Aunt     Medications:   Current Outpatient Medications on File Prior to Visit  Medication Sig Dispense Refill  . ALPRAZolam (XANAX) 0.5 MG tablet Take 0.5 tablets (0.25 mg total) by mouth 3 (three) times daily as needed for anxiety. 42 tablet 0  . aspirin 81 MG tablet Take 1 tablet (81 mg total) by mouth daily. 30 tablet   . cyanocobalamin (,VITAMIN B-12,) 1000 MCG/ML injection INJECT 1 ML INTO THE MUSCLE EVERY 2 WEEKS AS DIRECTED  5  . ferrous sulfate 325 (65 FE) MG tablet Take 1 tablet (325 mg total) by mouth 2 (two) times daily with a meal. 60 tablet 0  . folic acid (FOLVITE) 1 MG tablet Take 2 tablets (2 mg total) by mouth daily. 30 tablet 0  . hydrochlorothiazide (HYDRODIURIL) 12.5 MG tablet Take 12.5 mg by mouth daily.  2  . megestrol (MEGACE) 40 MG tablet Take 1 tablet (40 mg total) by mouth 2 (two) times daily. 60 tablet 0  . pantoprazole (PROTONIX) 40 MG tablet TAKE 1 TABLET BY MOUTH EVERY DAY IN THE MORNING  2  . pyridOXINE (B-6) 50 MG tablet Take 1 tablet (50 mg total) by mouth daily. 30 tablet 0   No current facility-administered medications on file prior to visit.     Allergies:   Allergies  Allergen Reactions  . Food Itching and Swelling    Melons  . Penicillins Other (See Comments)    Spots and made  hair fall out     Physical Exam  There were no vitals filed for this visit. There is no height or weight on file to calculate BMI. No exam data present  General: well developed, well nourished, pleasant middle-aged African-American female, seated, in no evident distress Head: head normocephalic and atraumatic.   Neck: supple with no carotid or supraclavicular bruits Cardiovascular: regular rate and rhythm, no murmurs Musculoskeletal: no deformity Skin:  no rash/petichiae Vascular:  Normal pulses all extremities  Neurologic Exam Mental Status: Awake and fully alert. Oriented to place and time. Recent and remote memory intact. Attention span, concentration and fund of  knowledge appropriate. Mood and affect appropriate.  Cranial Nerves: Fundoscopic exam reveals sharp disc margins. Pupils equal, briskly reactive to light. Extraocular movements full without nystagmus. Visual fields full to confrontation.  Subjective blurriness from right eye only.  Hearing intact. Facial sensation intact. Face, tongue, palate moves normally and symmetrically.  Motor: Normal bulk and tone.  LUE: 4/5; LLE: 4+/5 with weak ankle dorsiflexion Sensory.: intact to touch , pinprick , position and vibratory sensation.  Coordination: Rapid alternating movements normal and right upper extremity. Finger-to-nose and heel-to-shin performed accurately bilaterally.  Orbits right arm over left arm.  Decreased left finger dexterity. Gait and Station: Arises from chair without difficulty. Stance is normal. Gait demonstrates normal stride length and balance . Able to heel, toe and tandem walk with moderate difficulty.  Reflexes: 1+ and symmetric. Toes downgoing.    NIHSS  0 Modified Rankin  2    Diagnostic Data (Labs, Imaging, Testing)  CT ANGIO HEAD W OR WO CONTRAST CT ANGIO NECK W OR WO CONTRAST 02/11/2018 IMPRESSION: 1. There is no residual low-density material in the proximal Right ICA from that seen on  11/10/2017, which was likely adherent thrombus. 2. No atherosclerosis or arterial abnormality in the neck. The [place] branches are within normal limits, with no intracranial artery occlusion or stenosis identified. 3. There is a subtle 1-2 mm lesion at the distal Left ICA cavernous segment which probably represents a small infundibulum of an inferior hypophyseal artery (normal variant). This is more apparent than in September. 4. Only subtle right MCA territory encephalomalacia on head CT today corresponding to the scattered small infarcts seen in September. 5. No new intracranial abnormality.   HOSPITAL ADMISSION CT HEAD WO CONTRAST 11/10/2017 IMPRESSION: 1. Negative non-contrast CT HEAD. 2. ASPECTS is 10.  CT ANGIO HEAD W OR WO CONTRAST CT ANGIO NECK W OR WO CONTRAST 11/10/2017 IMPRESSION: CTA NECK: 1. RIGHT carotid bulb intramural hematoma, less likely atypical fibromuscular dysplasia. No flow-limiting stenosis. 2. Patent vertebral arteries. CTA HEAD: 1.  No emergent large vessel occlusion or flow-limiting stenosis. 2. Mild intracranial atherosclerosis, less likely vasculopathy.  CT ANGIO HEAD W OR WO CONTRAST CT ANGIO NECK W OR WO CONTRAST CT Cerebral PERFUSION W CONTRAST 11/11/2017 IMPRESSION: 1. Patent carotid and vertebral arteries. No dissection, aneurysm, or hemodynamically significant stenosis utilizing NASCET criteria. 2. Focal posterior wall thickening of the right proximal ICA is less pronounced on the current study than on the prior study. Findings may represent a soft plaque rupture. Additionally, given neck being held in flexion on the prior study, this finding may have represented a vessel kink. 3. Stable patent anterior and posterior intracranial circulation. No large vessel occlusion, aneurysm, or significant stenosis. 4. Stable negative noncontrast CT of the head. 5. No acute stroke or ischemic penumbra by CT perfusion criteria. There are a  few foci of T-max greater than 4 seconds in the right MCA distribution which may reflect small vessel ischemic changes.  MR BRAIN WO CONTRAST 11/11/2017 IMPRESSION: Several scattered subcentimeter foci of acute/early subacute infarction throughout the right MCA distribution. No associated hemorrhage or mass effect.  VAS US CAROTID DUPLEX BILATERAL 11/12/2017 Final Interpretation: Right Carotid: Velocities in the right ICA are consistent with a 40-59%        stenosis. Velocity may vary due to tortuosity. Left Carotid: Velocities in the left ICA are consistent with a 40-59% stenosis.       Velocity may vary due to tortuosity. Vertebrals: Bilateral vertebral arteries demonstrate antegrade flow.  ECHOCARDIOGRAM 11/11/2017 Study  Conclusions - Left ventricle: The cavity size was normal. Systolic function was   normal. The estimated ejection fraction was in the range of 60%   to 65%. Wall motion was normal; there were no regional wall   motion abnormalities. Left ventricular diastolic function   parameters were normal for the patient&'s age.  ECHO TEE 11/13/2017 Study Conclusions - Left ventricle: Systolic function was normal. The estimated   ejection fraction was in the range of 60% to 65%. Wall motion was   normal; there were no regional wall motion abnormalities. - Aortic valve: There was trivial regurgitation. - Mitral valve: There was mild regurgitation. - Left atrium: No evidence of thrombus in the atrial cavity or   appendage. - Right ventricle: The cavity size was normal. Wall thickness was   normal. Systolic function was normal. - Right atrium: No evidence of thrombus in the atrial cavity or   appendage. - Atrial septum: No defect or patent foramen ovale was identified   by color flow Doppler or saline microcavitation study. - Pericardium, extracardiac: A trivial pericardial effusion was   identified.  VAS [place] LOWER EXTREMITY VENOUS  BILATERAL 11/12/2017 Final Interpretation: Right: There is no evidence of deep vein thrombosis in the lower extremity. No cystic structure found in the popliteal fossa. Left: There is no evidence of deep vein thrombosis in the lower extremity. No cystic structure found in the popliteal fossa.    ASSESSMENT: Mallory Robinson is a 43 y.o. 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 Vascular risk factors include prior TIA, depression, iron deficiency anemia, significant thrombocythemia during admission, and HTN.  Mallory Robinson is being seen today for follow-up visit and overall doing well from a neurological standpoint ***.     PLAN: -Continue aspirin 81 mg daily for secondary stroke prevention -Start Xanax 0.25 mg 3 times daily as needed due to increased anxiety with increased stimulation that worsens deficits -Consider headache treatment in the future if needed -F/u with PCP regarding your HTN management -continue to monitor BP at home -advised to continue to stay active and maintain a healthy diet -Maintain strict control of hypertension with blood pressure goal below 130/90, diabetes with hemoglobin A1c goal below 6.5% and cholesterol with LDL cholesterol (bad cholesterol) goal below 70 mg/dL. I also advised the patient to eat a healthy diet with plenty of whole grains, cereals, fruits and vegetables, exercise regularly and maintain ideal body weight.  Follow up in 3 months or call earlier if needed   Greater than 50% of time during this 25 minute visit was spent on counseling,explanation of diagnosis of multiple embolic infarcts, reviewing risk factor management of ICA dissection versus thrombosis, iron deficiency anemia, thrombocythemia, HTN and prior TIA, and anxiety, planning of further management,  discussion with patient and family and coordination of care    Venancio Poisson, AGNP-BC  [facility] [address] [place], [place] [address]  Phone [phone] Fax [phone] Note: This document was prepared with digital dictation and possible smart phrase technology. Any transcriptional errors that result from this process are unintentional.

## 2018-03-18 ENCOUNTER — Encounter: Payer: Self-pay | Admitting: Adult Health

## 2018-12-27 ENCOUNTER — Other Ambulatory Visit: Payer: Self-pay

## 2018-12-27 ENCOUNTER — Emergency Department (HOSPITAL_COMMUNITY): Payer: Medicaid Other

## 2018-12-27 ENCOUNTER — Inpatient Hospital Stay (HOSPITAL_COMMUNITY): Payer: Medicaid Other

## 2018-12-27 ENCOUNTER — Encounter (HOSPITAL_COMMUNITY): Payer: Self-pay | Admitting: Radiology

## 2018-12-27 ENCOUNTER — Inpatient Hospital Stay (HOSPITAL_COMMUNITY)
Admission: EM | Admit: 2018-12-27 | Discharge: 2018-12-30 | DRG: 103 | Disposition: A | Discharge status: Home / Self Care | Payer: Medicaid Other | Attending: Neurology | Admitting: Neurology

## 2018-12-27 DIAGNOSIS — W19XXXA Unspecified fall, initial encounter: Secondary | ICD-10-CM | POA: Diagnosis not present

## 2018-12-27 DIAGNOSIS — Z823 Family history of stroke: Secondary | ICD-10-CM

## 2018-12-27 DIAGNOSIS — Z880 Allergy status to penicillin: Secondary | ICD-10-CM | POA: Diagnosis not present

## 2018-12-27 DIAGNOSIS — Z79899 Other long term (current) drug therapy: Secondary | ICD-10-CM | POA: Diagnosis not present

## 2018-12-27 DIAGNOSIS — Z20828 Contact with and (suspected) exposure to other viral communicable diseases: Secondary | ICD-10-CM | POA: Diagnosis present

## 2018-12-27 DIAGNOSIS — R531 Weakness: Secondary | ICD-10-CM | POA: Diagnosis not present

## 2018-12-27 DIAGNOSIS — F319 Bipolar disorder, unspecified: Secondary | ICD-10-CM | POA: Diagnosis present

## 2018-12-27 DIAGNOSIS — G8194 Hemiplegia, unspecified affecting left nondominant side: Secondary | ICD-10-CM | POA: Diagnosis present

## 2018-12-27 DIAGNOSIS — Z8249 Family history of ischemic heart disease and other diseases of the circulatory system: Secondary | ICD-10-CM

## 2018-12-27 DIAGNOSIS — I6389 Other cerebral infarction: Secondary | ICD-10-CM | POA: Diagnosis not present

## 2018-12-27 DIAGNOSIS — R000 Tachycardia, unspecified: Secondary | ICD-10-CM | POA: Diagnosis not present

## 2018-12-27 DIAGNOSIS — I639 Cerebral infarction, unspecified: Secondary | ICD-10-CM | POA: Diagnosis not present

## 2018-12-27 DIAGNOSIS — G43009 Migraine without aura, not intractable, without status migrainosus: Secondary | ICD-10-CM | POA: Diagnosis not present

## 2018-12-27 DIAGNOSIS — I10 Essential (primary) hypertension: Secondary | ICD-10-CM | POA: Diagnosis present

## 2018-12-27 DIAGNOSIS — R29703 NIHSS score 3: Secondary | ICD-10-CM | POA: Diagnosis present

## 2018-12-27 DIAGNOSIS — R2990 Unspecified symptoms and signs involving the nervous system: Secondary | ICD-10-CM | POA: Diagnosis not present

## 2018-12-27 DIAGNOSIS — Z91018 Allergy to other foods: Secondary | ICD-10-CM | POA: Diagnosis not present

## 2018-12-27 DIAGNOSIS — G43109 Migraine with aura, not intractable, without status migrainosus: Principal | ICD-10-CM | POA: Diagnosis present

## 2018-12-27 DIAGNOSIS — Z7982 Long term (current) use of aspirin: Secondary | ICD-10-CM

## 2018-12-27 DIAGNOSIS — I69354 Hemiplegia and hemiparesis following cerebral infarction affecting left non-dominant side: Secondary | ICD-10-CM | POA: Diagnosis not present

## 2018-12-27 DIAGNOSIS — E785 Hyperlipidemia, unspecified: Secondary | ICD-10-CM | POA: Diagnosis present

## 2018-12-27 DIAGNOSIS — G43919 Migraine, unspecified, intractable, without status migrainosus: Secondary | ICD-10-CM

## 2018-12-27 DIAGNOSIS — I169 Hypertensive crisis, unspecified: Secondary | ICD-10-CM | POA: Diagnosis not present

## 2018-12-27 DIAGNOSIS — R29818 Other symptoms and signs involving the nervous system: Secondary | ICD-10-CM | POA: Diagnosis not present

## 2018-12-27 LAB — CBC
HCT: 28.3 % — ABNORMAL LOW (ref 36.0–46.0)
Hemoglobin: 7.4 g/dL — ABNORMAL LOW (ref 12.0–15.0)
MCH: 17.1 pg — ABNORMAL LOW (ref 26.0–34.0)
MCHC: 26.1 g/dL — ABNORMAL LOW (ref 30.0–36.0)
MCV: 65.2 fL — ABNORMAL LOW (ref 80.0–100.0)
Platelets: 1181 10*3/uL (ref 150–400)
RBC: 4.34 MIL/uL (ref 3.87–5.11)
RDW: 30.5 % — ABNORMAL HIGH (ref 11.5–15.5)
WBC: 11.6 10*3/uL — ABNORMAL HIGH (ref 4.0–10.5)
nRBC: 0.3 % — ABNORMAL HIGH (ref 0.0–0.2)

## 2018-12-27 LAB — DIFFERENTIAL
Abs Immature Granulocytes: 0 10*3/uL (ref 0.00–0.07)
Basophils Absolute: 0.1 10*3/uL (ref 0.0–0.1)
Basophils Relative: 1 %
Eosinophils Absolute: 0 10*3/uL (ref 0.0–0.5)
Eosinophils Relative: 0 %
Lymphocytes Relative: 36 %
Lymphs Abs: 4.2 10*3/uL — ABNORMAL HIGH (ref 0.7–4.0)
Monocytes Absolute: 0.6 10*3/uL (ref 0.1–1.0)
Monocytes Relative: 5 %
Neutro Abs: 6.7 10*3/uL (ref 1.7–7.7)
Neutrophils Relative %: 58 %
nRBC: 0 /100 WBC

## 2018-12-27 LAB — I-STAT CHEM 8, ED
BUN: 3 mg/dL — ABNORMAL LOW (ref 6–20)
Calcium, Ion: 1.07 mmol/L — ABNORMAL LOW (ref 1.15–1.40)
Chloride: 104 mmol/L (ref 98–111)
Creatinine, Ser: 0.7 mg/dL (ref 0.44–1.00)
Glucose, Bld: 101 mg/dL — ABNORMAL HIGH (ref 70–99)
HCT: 30 % — ABNORMAL LOW (ref 36.0–46.0)
Hemoglobin: 10.2 g/dL — ABNORMAL LOW (ref 12.0–15.0)
Potassium: 3.5 mmol/L (ref 3.5–5.1)
Sodium: 139 mmol/L (ref 135–145)
TCO2: 19 mmol/L — ABNORMAL LOW (ref 22–32)

## 2018-12-27 LAB — COMPREHENSIVE METABOLIC PANEL
ALT: 49 U/L — ABNORMAL HIGH (ref 0–44)
AST: 73 U/L — ABNORMAL HIGH (ref 15–41)
Albumin: 4 g/dL (ref 3.5–5.0)
Alkaline Phosphatase: 78 U/L (ref 38–126)
Anion gap: 13 (ref 5–15)
BUN: 5 mg/dL — ABNORMAL LOW (ref 6–20)
CO2: 18 mmol/L — ABNORMAL LOW (ref 22–32)
Calcium: 9.3 mg/dL (ref 8.9–10.3)
Chloride: 105 mmol/L (ref 98–111)
Creatinine, Ser: 0.81 mg/dL (ref 0.44–1.00)
GFR calc Af Amer: >60 mL/min (ref >60)
GFR calc non Af Amer: >60 mL/min (ref >60)
Glucose, Bld: 103 mg/dL — ABNORMAL HIGH (ref 70–99)
Potassium: 3.5 mmol/L (ref 3.5–5.1)
Sodium: 136 mmol/L (ref 135–145)
Total Bilirubin: 0.5 mg/dL (ref 0.3–1.2)
Total Protein: 7.5 g/dL (ref 6.5–8.1)

## 2018-12-27 LAB — PROTIME-INR
INR: 1.1 (ref 0.8–1.2)
Prothrombin Time: 14.2 seconds (ref 11.4–15.2)

## 2018-12-27 LAB — APTT: aPTT: 26 seconds (ref 24–36)

## 2018-12-27 LAB — SARS CORONAVIRUS 2 (TAT 6-24 HRS): SARS Coronavirus 2: NEGATIVE

## 2018-12-27 LAB — I-STAT BETA HCG BLOOD, ED (MC, WL, AP ONLY): I-stat hCG, quantitative: <5 m[IU]/mL (ref <5)

## 2018-12-27 LAB — CBG MONITORING, ED: Glucose-Capillary: 94 mg/dL (ref 70–99)

## 2018-12-27 MED ORDER — CLEVIDIPINE BUTYRATE 0.5 MG/ML IV EMUL
0.0000 mg/h | INTRAVENOUS | Status: DC
  Administered 2018-12-27: 1 mg/h via INTRAVENOUS
  Filled 2018-12-27: qty 50

## 2018-12-27 MED ORDER — ONDANSETRON HCL 4 MG/2ML IJ SOLN
4.0000 mg | Freq: Four times a day (QID) | INTRAMUSCULAR | Status: DC | PRN
  Administered 2018-12-27 (×2): 4 mg via INTRAVENOUS
  Filled 2018-12-27 (×2): qty 2

## 2018-12-27 MED ORDER — IOHEXOL 350 MG/ML SOLN
100.0000 mL | Freq: Once | INTRAVENOUS | Status: AC | PRN
  Administered 2018-12-27: 100 mL via INTRAVENOUS

## 2018-12-27 MED ORDER — ACETAMINOPHEN 650 MG RE SUPP: 650.0000 mg | RECTAL | Status: DC | PRN

## 2018-12-27 MED ORDER — PANTOPRAZOLE SODIUM 40 MG IV SOLR
40.0000 mg | Freq: Every day | INTRAVENOUS | Status: DC
  Administered 2018-12-27 – 2018-12-28 (×2): 40 mg via INTRAVENOUS
  Filled 2018-12-27 (×2): qty 40

## 2018-12-27 MED ORDER — ACETAMINOPHEN 325 MG PO TABS
650.0000 mg | ORAL_TABLET | ORAL | Status: DC | PRN
  Administered 2018-12-27 – 2018-12-29 (×5): 650 mg via ORAL
  Filled 2018-12-27 (×5): qty 2

## 2018-12-27 MED ORDER — HYDRALAZINE HCL 20 MG/ML IJ SOLN
10.0000 mg | Freq: Once | INTRAMUSCULAR | Status: AC
  Administered 2018-12-27: 10 mg via INTRAVENOUS
  Filled 2018-12-27: qty 1

## 2018-12-27 MED ORDER — STROKE: EARLY STAGES OF RECOVERY BOOK
Freq: Once | Status: DC
  Filled 2018-12-27: qty 1

## 2018-12-27 MED ORDER — SODIUM CHLORIDE 0.9 % IV SOLN: 50.0000 mL | Freq: Once | INTRAVENOUS | Status: DC

## 2018-12-27 MED ORDER — HYDRALAZINE HCL 20 MG/ML IJ SOLN: 10.0000 mg | INTRAMUSCULAR | Status: DC | PRN

## 2018-12-27 MED ORDER — SODIUM CHLORIDE 0.9 % IV SOLN: INTRAVENOUS | Status: DC

## 2018-12-27 MED ORDER — ACETAMINOPHEN 160 MG/5ML PO SOLN: 650.0000 mg | ORAL | Status: DC | PRN

## 2018-12-27 MED ORDER — SODIUM CHLORIDE 0.9% FLUSH
3.0000 mL | Freq: Once | INTRAVENOUS | Status: AC
Start: 2018-12-27 — End: 2018-12-27
  Administered 2018-12-27: 3 mL via INTRAVENOUS

## 2018-12-27 MED ORDER — ALTEPLASE (STROKE) FULL DOSE INFUSION
0.9000 mg/kg | Freq: Once | INTRAVENOUS | Status: AC
  Administered 2018-12-27: 83.6 mg via INTRAVENOUS
  Filled 2018-12-27: qty 100

## 2018-12-27 MED ORDER — LABETALOL HCL 5 MG/ML IV SOLN
20.0000 mg | Freq: Once | INTRAVENOUS | Status: AC
  Administered 2018-12-27: 20 mg via INTRAVENOUS

## 2018-12-27 MED ORDER — SENNOSIDES-DOCUSATE SODIUM 8.6-50 MG PO TABS: 1.0000 | ORAL_TABLET | Freq: Every evening | ORAL | Status: DC | PRN

## 2018-12-27 NOTE — H&P (Addendum)
Neurology H&P  Reason for Consult: Left sided weakness Referring Physician: Dr. Sherry Ruffing  CC: Left-sided weakness  History is obtained from: Patient, chart  HPI: Mallory Robinson is a 43 y.o. 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  She was walking her dogs have been suddenly her left leg went out and she also had weakness of the left arm.  EMS was called because she has had a stroke that affected the left side of her body in 2019 and she recognized the symptoms. She reports having been sleepier prior over the past 4 days and had been celebrating on occasion and dancing around earlier in the morning. Denies any preceding illness sickness fevers chills shortness of breath nausea vomiting.  Denies visual symptoms.   LKW: 12 PM on 12/27/2018 tpa given?:  Yes Premorbid modified Rankin scale (mRS):2   ROS: ROS was performed and is negative except as noted in the HPI.        Past Medical History:  Diagnosis Date  . Bipolar 1 disorder ([place])   . Complication of anesthesia   . Depression   . Hypertension   . PONV (postoperative nausea and vomiting)   . Stroke [facility])          Family History  Problem Relation Age of Onset  . Hypertension Mother   . Stroke Father   . Stroke Maternal Aunt    Social History:   reports that she has never smoked. She has never used smokeless tobacco. She reports current alcohol use. She reports that she does not use drugs.  Medications  Current Facility-Administered Medications:  .  sodium chloride flush (NS) 0.9 % injection 3 mL, 3 mL, Intravenous, Once, Tegeler, Gwenyth Allegra, MD  Current Outpatient Medications:  .   ALPRAZolam (XANAX) 0.5 MG tablet, Take 0.5 tablets (0.25 mg total) by mouth 3 (three) times daily as needed for anxiety., Disp: 42 tablet, Rfl: 0 .  aspirin 81 MG tablet, Take 1 tablet (81 mg total) by mouth daily., Disp: 30 tablet, Rfl:  .  cyanocobalamin (,VITAMIN B-12,) 1000 MCG/ML injection, INJECT 1 ML INTO THE MUSCLE EVERY 2 WEEKS AS DIRECTED, Disp: , Rfl: 5 .  ferrous sulfate 325 (65 FE) MG tablet, Take 1 tablet (325 mg total) by mouth 2 (two) times daily with a meal., Disp: 60 tablet, Rfl: 0 .  folic acid (FOLVITE) 1 MG tablet, Take 2 tablets (2 mg total) by mouth daily., Disp: 30 tablet, Rfl: 0 .  hydrochlorothiazide (HYDRODIURIL) 12.5 MG tablet, Take 12.5 mg by mouth daily., Disp: , Rfl: 2 .  megestrol (MEGACE) 40 MG tablet, Take 1 tablet (40 mg total) by mouth 2 (two) times daily., Disp: 60 tablet, Rfl: 0 .  pantoprazole (PROTONIX) 40 MG tablet, TAKE 1 TABLET BY MOUTH EVERY DAY IN THE MORNING, Disp: , Rfl: 2 .  pyridOXINE (B-6) 50 MG tablet, Take 1 tablet (50 mg total) by mouth daily., Disp: 30 tablet, Rfl: 0  Exam: Current vital signs: BP (!) 143/96   Pulse 86   Temp 98.9 F (37.2 C)   Resp 18   Ht 5\' 2"  (1.575 m)   Wt 92.9 kg   SpO2 100%   BMI 37.46 kg/m  Vital signs in last 24 hours: Temp:  [  98.9 F (37.2 C)] 98.9 F (37.2 C) (11/07 1345) Pulse Rate:  [83-97] 86 (11/07 1415) Resp:  [18-30] 18 (11/07 1415) BP: (143-168)/(96-109) 143/96 (11/07 1415) SpO2:  [99 %-100 %] 100 % (11/07 1415) Weight:  [86.2 kg-92.9 kg] 92.9 kg (11/07 1300) GENERAL: Awake, alert in NAD HEENT: - Normocephalic and atraumatic, dry mm, no LN++, no Thyromegally LUNGS - Clear to auscultation bilaterally with no wheezes CV - S1S2 RRR, no m/r/g, equal pulses bilaterally. ABDOMEN - Soft, nontender, nondistended with normoactive BS Ext: warm, well perfused, intact peripheral pulses, no edema  NEURO:  Mental Status: AA&Ox3  Language: speech is nondysarthric.  Naming, repetition, fluency, and  comprehension intact. Cranial Nerves: PERRL. EOMI, visual fields full, no facial asymmetry, facial sensation intact, hearing intact, tongue/uvula/soft palate midline, normal sternocleidomastoid and trapezius muscle strength. No evidence of tongue atrophy or fibrillations Motor: Left upper extremity 4/5 with vertical drift.  Left lower extremity 3/5 with severe vertical drift and hitting the bed before 5 seconds Tone: is normal and bulk is normal Sensation-diminished on the left-change from before when she had completely recovered Coordination: FTN intact bilateral  NIHSS-4   Labs I have reviewed labs in epic and the results pertinent to this consultation are:   CBC Labs (Brief)          Component Value Date/Time   WBC 11.6 (H) 12/27/2018 1249   RBC 4.34 12/27/2018 1249   HGB 10.2 (L) 12/27/2018 1257   HGB 13.2 12/18/2017 0936   HCT 30.0 (L) 12/27/2018 1257   HCT 43.3 12/18/2017 0936   PLT 1,181 (HH) 12/27/2018 1249   PLT 446 12/18/2017 0936   MCV 65.2 (L) 12/27/2018 1249   MCV 81 12/18/2017 0936   MCH 17.1 (L) 12/27/2018 1249   MCHC 26.1 (L) 12/27/2018 1249   RDW 30.5 (H) 12/27/2018 1249   LYMPHSABS 4.2 (H) 12/27/2018 1249   LYMPHSABS 1.8 12/18/2017 0936   MONOABS 0.6 12/27/2018 1249   EOSABS 0.0 12/27/2018 1249   EOSABS 0.1 12/18/2017 0936   BASOSABS 0.1 12/27/2018 1249   BASOSABS 0.1 12/18/2017 0936      CMP     Labs (Brief)          Component Value Date/Time   NA 139 12/27/2018 1257   K 3.5 12/27/2018 1257   CL 104 12/27/2018 1257   CO2 18 (L) 12/27/2018 1249   GLUCOSE 101 (H) 12/27/2018 1257   BUN 3 (L) 12/27/2018 1257   CREATININE 0.70 12/27/2018 1257   CALCIUM 9.3 12/27/2018 1249   PROT 7.5 12/27/2018 1249   ALBUMIN 4.0 12/27/2018 1249   AST 73 (H) 12/27/2018 1249   ALT 49 (H) 12/27/2018 1249   ALKPHOS 78 12/27/2018 1249   BILITOT 0.5 12/27/2018 1249   GFRNONAA >60 12/27/2018 1249   GFRAA >60 12/27/2018 1249       Lipid Panel  Labs (Brief)          Component Value Date/Time   CHOL 52 11/12/2017 0219   TRIG 96 11/12/2017 0219   HDL 28 (L) 11/12/2017 0219   CHOLHDL 1.9 11/12/2017 0219   VLDL 19 11/12/2017 0219   LDLCALC 5 11/12/2017 0219       Imaging I have reviewed the images obtained:  CT-scan of the brain-aspects 10.  No bleed CTA head and neck-2 mm hypodense focus within the posterior aspect of the right carotid bulb suspicious for an adherent thrombus overlying the carotid web.  CTA head unremarkable CT perfusion unremarkable  Assessment: 43 year old  with past history of right cerebral stroke last year secondary to possible right carotid dissection/ruptured plaque in 2019 with residual left hemiparesis which was very mild, hypertension, bipolar disorder not on any anticoagulation currently on antiplatelets presenting with last known normal at noon with sudden onset of weakness of the left leg and giving out of the left leg while she is walking. She is also weak on the left arm and also has decreased sensation on the left which were the symptoms that had completely recovered after last stroke with the exception of the leg weakness-which has not completely recovered but now is much weaker than baseline. NIH stroke scale was 4 with a delta NIH of at least 3. CT head without bleed CTA head and neck shows a possible adherent thrombus on a carotid web which was not present on the CTA from 2019, which might be a source of an embolus I discussed the risks and benefits for IV TPA with her and she decided to go ahead with IV TPA understanding the risks and benefits.  I waited to obtain the vascular imaging prior to initiating TPA because I wanted to be sure that she was not having just a recrudescence of her old symptoms but given the new vascular finding of the adherent thrombus over the underlying carotid web, possibility of new embolization in the new event was high and hence TPA  was used.  I discussed my rationale and findings with the patient as well Slight delay in TPA also because of blood pressure which was over the TPA parameters-especially the diastolic blood pressure.  Required multiple doses of labetalol hydralazine and initiation of Cleviprex.  Impression: Acute ischemic stroke Other differentials include stroke symptom recrudescence  Recommendations: Admit to progressive floor if she does not have any needs for drips.  Currently requiring a drip see below.  If continues to require drip, will admit to ICU. MRI brain in 24 hours Blood pressure goal-below 180/105. No antiplatelets or anticoagulants Vitals and neurochecks per TPA protocol Currently requiring Cleviprex to maintain her diastolic below [id number]. We will attempt to give as needed dose of labetalol and hydralazine  2D echocardiogram A1c Lipid panel Treatment neurochecks PT OT Speech therapy Check chest x-ray Check urinalysis  Diet: N.p.o. until cleared by bedside stroke swallow evaluation FEN: Gentle hydration with 75 cc normal saline an hour CODE STATUS: Full code  -- Amie Portland, MD [facility] Pager: [phone] If 7pm to 7am, please call on call as listed on AMION.  CRITICAL CARE ATTESTATION Performed by: Amie Portland, MD Total critical care time: 45 minutes Critical care time was exclusive of separately billable procedures and treating other patients and/or supervising APPs/Residents/Students Critical care was necessary to treat or prevent imminent or life-threatening deterioration due to acute ischemic stroke, IV thrombolysis administration This patient is critically ill and at significant risk for neurological worsening and/or death and care requires constant monitoring. Critical care was time spent personally by me on the following activities: development of treatment plan with patient and/or surrogate as well as nursing, discussions with consultants, evaluation  of patient's response to treatment, examination of patient, obtaining history from patient or surrogate, ordering and performing treatments and interventions, ordering and review of laboratory studies, ordering and review of radiographic studies, pulse oximetry, re-evaluation of patient's condition, participation in multidisciplinary rounds and medical decision making of high complexity in the care of this patient.

## 2018-12-27 NOTE — Progress Notes (Signed)
Pharmacist Code Stroke Response  Notified to mix tPA at 1318  Delivered tPA to RN at 1323  tPA dose = 8.4 mg bolus over 1 minute followed by 75.2 mg for a total dose of 83.6 mg over 1 hour  Issues/delays encountered (if applicable): Diastolic was slightly elevated - labetalol given  Mallory Robinson 12/27/18 1:28 PM

## 2018-12-27 NOTE — ED Provider Notes (Signed)
[facility] EMERGENCY DEPARTMENT Provider Note   CSN: [id number] Arrival date & time: 12/27/18  1244     History   Chief Complaint No chief complaint on file. Stroke  HPI Mallory Robinson is a 43 y.o. female.     The history is provided by the patient and medical records. No language interpreter was used.  Cerebrovascular Accident This is a recurrent problem. The current episode started 1 to 2 hours ago. The problem occurs constantly. The problem has not changed since onset.Pertinent negatives include no chest pain, no abdominal pain, no headaches and no shortness of breath. Nothing aggravates the symptoms. Nothing relieves the symptoms. She has tried nothing for the symptoms. The treatment provided no relief.    Past Medical History:  Diagnosis Date  . Bipolar 1 disorder ([place])   . Complication of anesthesia   . Depression   . Hypertension   . PONV (postoperative nausea and vomiting)   . Stroke [facility])     Patient Active Problem List   Diagnosis Date Noted  . Acute ischemic stroke ([place]) 12/27/2018  . Cerebral embolism with cerebral infarction 11/11/2017  . Acute CVA (cerebrovascular accident) ([place]) 11/11/2017  . Neurological deficit present 11/10/2017  . Bipolar I disorder, most recent episode (or current) depressed, severe, without mention of psychotic behavior 03/05/2013  . PTSD (post-traumatic stress disorder) 03/05/2013  . Alcohol dependence ([place]) 03/04/2013    Past Surgical History:  Procedure Laterality Date  . CESAREAN SECTION     X2  . HERNIA REPAIR    . TEE WITHOUT CARDIOVERSION N/A 11/13/2017   Procedure: TRANSESOPHAGEAL ECHOCARDIOGRAM (TEE) bubble study;  Surgeon: Skeet Latch, MD;  Location: [place];  Service: Cardiovascular;  Laterality: N/A;     OB History   No obstetric history on file.      Home Medications    Prior to Admission medications   Medication Sig Start Date End Date Taking? Authorizing Provider  ALPRAZolam  Duanne Moron) 0.5 MG tablet Take 0.5 tablets (0.25 mg total) by mouth 3 (three) times daily as needed for anxiety. 12/18/17   Frann Rider, NP  aspirin 81 MG tablet Take 1 tablet (81 mg total) by mouth daily. 02/13/18   Frann Rider, NP  cyanocobalamin (,VITAMIN B-12,) 1000 MCG/ML injection INJECT 1 ML INTO THE MUSCLE EVERY 2 WEEKS AS DIRECTED 11/26/17   [provider]  ferrous sulfate 325 (65 FE) MG tablet Take 1 tablet (325 mg total) by mouth 2 (two) times daily with a meal. 11/13/17   Ghimire, Henreitta Leber, MD  folic acid (FOLVITE) 1 MG tablet Take 2 tablets (2 mg total) by mouth daily. 11/14/17   Ghimire, Henreitta Leber, MD  hydrochlorothiazide (HYDRODIURIL) 12.5 MG tablet Take 12.5 mg by mouth daily. 12/05/17   [provider]  megestrol (MEGACE) 40 MG tablet Take 1 tablet (40 mg total) by mouth 2 (two) times daily. 11/13/17   Ghimire, Henreitta Leber, MD  pantoprazole (PROTONIX) 40 MG tablet TAKE 1 TABLET BY MOUTH EVERY DAY IN THE MORNING 12/03/17   [provider]  pyridOXINE (B-6) 50 MG tablet Take 1 tablet (50 mg total) by mouth daily. 11/14/17   Ghimire, Henreitta Leber, MD    Family History Family History  Problem Relation Age of Onset  . Hypertension Mother   . Stroke Father   . Stroke Maternal Aunt     Social History Social History   Tobacco Use  . Smoking status: Never Smoker  . Smokeless tobacco: Never Used  Substance Use Topics  . Alcohol use: Yes    Comment: Occasional  . Drug use: No     Allergies   Food and Penicillins   Review of Systems Review of Systems  Constitutional: Negative for chills, diaphoresis, fatigue and fever.  HENT: Negative for congestion.   Eyes: Negative for photophobia and visual disturbance.  Respiratory: Negative for cough, chest tightness, shortness of breath, wheezing and stridor.   Cardiovascular: Negative for chest pain, palpitations and leg swelling.  Gastrointestinal: Negative for abdominal pain, constipation, diarrhea, nausea  and vomiting.  Genitourinary: Negative for flank pain and frequency.  Musculoskeletal: Negative for back pain, neck pain and neck stiffness.  Skin: Negative for rash and wound.  Neurological: Positive for weakness and numbness. Negative for dizziness, seizures, syncope, speech difficulty, light-headedness and headaches.  Psychiatric/Behavioral: Negative for agitation.  All other systems reviewed and are negative.    Physical Exam Updated Vital Signs BP (!) 168/108   Pulse 92   Resp (!) 21   Wt 92.9 kg   SpO2 100%   BMI 36.28 kg/m   Physical Exam Vitals signs and nursing note reviewed.  Constitutional:      General: She is not in acute distress.    Appearance: She is well-developed. She is not ill-appearing, toxic-appearing or diaphoretic.  HENT:     Head: Normocephalic and atraumatic.     Right Ear: External ear normal.     Left Ear: External ear normal.     Nose: Nose normal.     Mouth/Throat:     Pharynx: No oropharyngeal exudate.  Eyes:     Extraocular Movements: Extraocular movements intact.     Conjunctiva/sclera: Conjunctivae normal.     Pupils: Pupils are equal, round, and reactive to light.  Neck:     Musculoskeletal: Normal range of motion and neck supple. No muscular tenderness.  Cardiovascular:     Rate and Rhythm: Normal rate and regular rhythm.     Pulses: Normal pulses.     Heart sounds: No murmur.  Pulmonary:     Effort: No respiratory distress.     Breath sounds: No stridor. No wheezing, rhonchi or rales.  Chest:     Chest wall: No tenderness.  Abdominal:     General: Abdomen is flat. There is no distension.     Tenderness: There is no abdominal tenderness. There is no rebound.  Musculoskeletal:        General: No tenderness.  Skin:    General: Skin is warm.     Findings: No erythema or rash.  Neurological:     Mental Status: She is alert and oriented to person, place, and time.     Cranial Nerves: No cranial nerve deficit.     Sensory:  Sensory deficit present.     Motor: Weakness present. No abnormal muscle tone.     Deep Tendon Reflexes: Reflexes are normal and symmetric.  Psychiatric:        Mood and Affect: Mood normal.      ED Treatments / Results  Labs (all labs ordered are listed, but only abnormal results are displayed) Labs Reviewed  COMPREHENSIVE METABOLIC PANEL - Abnormal; Notable for the following components:      Result Value   CO2 18 (*)    Glucose, Bld 103 (*)    BUN 5 (*)    AST 73 (*)    ALT 49 (*)    All other components within normal limits  I-STAT CHEM 8, ED -  Abnormal; Notable for the following components:   BUN 3 (*)    Glucose, Bld 101 (*)    Calcium, Ion 1.07 (*)    TCO2 19 (*)    Hemoglobin 10.2 (*)    HCT 30.0 (*)    All other components within normal limits  SARS CORONAVIRUS 2 (TAT 6-24 HRS)  PROTIME-INR  APTT  CBC  DIFFERENTIAL  HIV ANTIBODY (ROUTINE TESTING W REFLEX)  CBG MONITORING, ED  I-STAT BETA HCG BLOOD, ED ([facility], [facility], [facility] ONLY)    EKG None  Radiology Ct Head Code Stroke Wo Contrast  Result Date: 12/27/2018 CLINICAL DATA:  Code stroke. Focal neuro deficit, less than 6 hours, stroke suspected. Additional history provided: Left arm weakness. EXAM: CT HEAD WITHOUT CONTRAST TECHNIQUE: Contiguous axial images were obtained from the base of the skull through the vertex without intravenous contrast. COMPARISON:  CT angiogram head/neck 02/11/2018, brain MRI 11/11/2017 FINDINGS: Brain: There is no acute intracranial hemorrhage. A few subtle sites of cortical hypodensity within the right MCA territory appear to correspond with previous infarcts. No definite acute cortical infarct identified. No evidence of intracranial mass. No midline shift or extra-axial fluid collection. Vascular: No hyperdense vessel. Skull: Normal. Negative for fracture or focal lesion. Sinuses/Orbits: Visualized orbits demonstrate no acute abnormality. No significant paranasal sinus disease or mastoid effusion  at the imaged levels. ASPECTS ([place] Stroke Program Early CT Score) - Ganglionic level infarction (caudate, lentiform nuclei, internal capsule, insula, M1-M3 cortex): 7 - Supraganglionic infarction (M4-M6 cortex): 3 Total score (0-10 with 10 being normal): 10 These results were called by telephone at the time of interpretation on 12/27/2018 at 1:08 pm to provider Dr. Rory Percy, who verbally acknowledged these results. IMPRESSION: A few subtle sites of cortical hypodensity within the right MCA territory appear to correspond with previous infarcts. No definite acute infarct identified. ASPECTS 10. Electronically Signed   By: Kellie Simmering DO   On: 12/27/2018 13:08    Procedures Procedures (including critical care time)  CRITICAL CARE Performed by: Gwenyth Allegra  Total critical care time: 35 minutes Critical care time was exclusive of separately billable procedures and treating other patients. Critical care was necessary to treat or prevent imminent or life-threatening deterioration. Critical care was time spent personally by me on the following activities: development of treatment plan with patient and/or surrogate as well as nursing, discussions with consultants, evaluation of patient's response to treatment, examination of patient, obtaining history from patient or surrogate, ordering and performing treatments and interventions, ordering and review of laboratory studies, ordering and review of radiographic studies, pulse oximetry and re-evaluation of patient's condition.      Medications Ordered in ED Medications  sodium chloride flush (NS) 0.9 % injection 3 mL (has no administration in time range)   stroke: mapping our early stages of recovery book (has no administration in time range)  0.9 %  sodium chloride infusion (has no administration in time range)  acetaminophen (TYLENOL) tablet 650 mg (has no administration in time range)    Or  acetaminophen (TYLENOL) 160 MG/5ML solution 650 mg  (has no administration in time range)    Or  acetaminophen (TYLENOL) suppository 650 mg (has no administration in time range)  senna-docusate (Senokot-S) tablet 1 tablet (has no administration in time range)  pantoprazole (PROTONIX) injection 40 mg (has no administration in time range)  labetalol (NORMODYNE) injection 20 mg (20 mg Intravenous Given 12/27/18 1326)    And  clevidipine (CLEVIPREX) infusion 0.5 mg/mL (has no administration in time  range)  alteplase (ACTIVASE) 1 mg/mL infusion 83.6 mg (has no administration in time range)    Followed by  0.9 %  sodium chloride infusion (has no administration in time range)  hydrALAZINE (APRESOLINE) injection 10 mg (has no administration in time range)  iohexol (OMNIPAQUE) 350 MG/ML injection 100 mL (100 mLs Intravenous Contrast Given 12/27/18 1304)     Initial Impression / Assessment and Plan / ED Course  I have reviewed the triage vital signs and the nursing notes.  Pertinent labs & imaging results that were available during my care of the patient were reviewed by me and considered in my medical decision making (see chart for details).        Mallory Robinson is a 43 y.o. female with a past medical history significant for prior stroke, bipolar disorder, and PTSD who presents as a code stroke.  Patient reports that at noon, approximately 1 hour 30 minutes prior to arrival, patient was walking and had her left leg and left arm got weak and numb.  She reports he is similar to when she had stroke in the past.  Patient was quickly brought in.  I saw the patient at the bridge and her airway appeared to be clear.  She was taken for immediate CT with neurology.  Neurology did not see evidence of bleed on CT imaging and he also did not see LVO.  Due to this, neurology will give the patient TPA and admit.  On my exam, patient does have weakness in her left arm and left leg compared to right as well as some numbness.  Patient is clear speech and symmetric  smile.  Normal sensation of the face.  Patient had normal extraocular movements and visual fields.  Lungs were clear and chest was nontender.  Abdomen was nontender.  Patient otherwise had no complaints preceding symptoms.  She reports she has had no Covid contacts and had no other complaints.  Patient will be given TPA and will be admitted to neurology.   Final Clinical Impressions(s) / ED Diagnoses   Final diagnoses:  Cerebrovascular accident (CVA), unspecified mechanism ([place])    Clinical Impression: 1. Cerebrovascular accident (CVA), unspecified mechanism ([place])     Disposition: Admit  This note was prepared with assistance of Dragon voice recognition software. Occasional wrong-word or sound-a-like substitutions may have occurred due to the inherent limitations of voice recognition software.      , Gwenyth Allegra, MD 12/27/18 [phone]

## 2018-12-27 NOTE — ED Triage Notes (Signed)
Pt BIB [facility] for eval of sudden onset L sided weakness, numbness while walking upstairs. Pt w/ hx of CVA in the past, states this feels similar, but sx are not the same. Pt arrives w/ persistent L arm/leg numbness/weakness. No appreciable facial droop, speech is clear and fluent. CBG 116 for EMS, no hx of DM. GCS 15, A&Ox4

## 2018-12-27 NOTE — Progress Notes (Signed)
Pt has worsening stroke symptoms while talking on the phone with her spouse at 2018. Pt experienced loss of voice, dizziness. Left arm and left leg become limp with tingling. Neuro MD paged pt in STAT MRI at present.

## 2018-12-27 NOTE — Consult Note (Deleted)
Neurology Consultation  Reason for Consult: Left sided weakness Referring Physician: Dr. Sherry Ruffing  CC: Left-sided weakness  History is obtained from: Patient, chart  HPI: Mallory Robinson is a 43 y.o. 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  She was walking her dogs have been suddenly her left leg went out and she also had weakness of the left arm.  EMS was called because she has had a stroke that affected the left side of her body in 2019 and she recognized the symptoms. She reports having been sleepier prior over the past 4 days and had been celebrating on occasion and dancing around earlier in the morning. Denies any preceding illness sickness fevers chills shortness of breath nausea vomiting.  Denies visual symptoms.   LKW: 12 PM on 12/27/2018 tpa given?:  Yes Premorbid modified Rankin scale (mRS):2   ROS: ROS was performed and is negative except as noted in the HPI.    Past Medical History:  Diagnosis Date  . Bipolar 1 disorder ([place])   . Complication of anesthesia   . Depression   . Hypertension   . PONV (postoperative nausea and vomiting)   . Stroke [facility])     Family History  Problem Relation Age of Onset  . Hypertension Mother   . Stroke Father   . Stroke Maternal Aunt    Social History:   reports that she has never smoked. She has never used smokeless tobacco. She reports current alcohol use. She reports that she does not use drugs.  Medications  Current Facility-Administered Medications:  .  sodium chloride flush (NS) 0.9 % injection 3 mL, 3 mL, Intravenous, Once, Tegeler, Gwenyth Allegra, MD  Current Outpatient Medications:  .  ALPRAZolam (XANAX) 0.5 MG tablet,  Take 0.5 tablets (0.25 mg total) by mouth 3 (three) times daily as needed for anxiety., Disp: 42 tablet, Rfl: 0 .  aspirin 81 MG tablet, Take 1 tablet (81 mg total) by mouth daily., Disp: 30 tablet, Rfl:  .  cyanocobalamin (,VITAMIN B-12,) 1000 MCG/ML injection, INJECT 1 ML INTO THE MUSCLE EVERY 2 WEEKS AS DIRECTED, Disp: , Rfl: 5 .  ferrous sulfate 325 (65 FE) MG tablet, Take 1 tablet (325 mg total) by mouth 2 (two) times daily with a meal., Disp: 60 tablet, Rfl: 0 .  folic acid (FOLVITE) 1 MG tablet, Take 2 tablets (2 mg total) by mouth daily., Disp: 30 tablet, Rfl: 0 .  hydrochlorothiazide (HYDRODIURIL) 12.5 MG tablet, Take 12.5 mg by mouth daily., Disp: , Rfl: 2 .  megestrol (MEGACE) 40 MG tablet, Take 1 tablet (40 mg total) by mouth 2 (two) times daily., Disp: 60 tablet, Rfl: 0 .  pantoprazole (PROTONIX) 40 MG tablet, TAKE 1 TABLET BY MOUTH EVERY DAY IN THE MORNING, Disp: , Rfl: 2 .  pyridOXINE (B-6) 50 MG tablet, Take 1 tablet (50 mg total) by mouth daily., Disp: 30 tablet, Rfl: 0  Exam: Current vital signs: BP (!) 143/96   Pulse 86   Temp 98.9 F (37.2 C)   Resp 18   Ht 5\' 2"  (1.575 m)   Wt 92.9 kg   SpO2 100%   BMI 37.46 kg/m  Vital signs in last 24 hours: Temp:  [98.9 F (37.2 C)] 98.9 F (37.2 C) (  11/07 1345) Pulse Rate:  [83-97] 86 (11/07 1415) Resp:  [18-30] 18 (11/07 1415) BP: (143-168)/(96-109) 143/96 (11/07 1415) SpO2:  [99 %-100 %] 100 % (11/07 1415) Weight:  [86.2 kg-92.9 kg] 92.9 kg (11/07 1300) GENERAL: Awake, alert in NAD HEENT: - Normocephalic and atraumatic, dry mm, no LN++, no Thyromegally LUNGS - Clear to auscultation bilaterally with no wheezes CV - S1S2 RRR, no m/r/g, equal pulses bilaterally. ABDOMEN - Soft, nontender, nondistended with normoactive BS Ext: warm, well perfused, intact peripheral pulses, no edema  NEURO:  Mental Status: AA&Ox3  Language: speech is nondysarthric.  Naming, repetition, fluency, and comprehension intact. Cranial Nerves:  PERRL. EOMI, visual fields full, no facial asymmetry, facial sensation intact, hearing intact, tongue/uvula/soft palate midline, normal sternocleidomastoid and trapezius muscle strength. No evidence of tongue atrophy or fibrillations Motor: Left upper extremity 4/5 with vertical drift.  Left lower extremity 3/5 with severe vertical drift and hitting the bed before 5 seconds Tone: is normal and bulk is normal Sensation-diminished on the left-change from before when she had completely recovered Coordination: FTN intact bilateral  NIHSS-4   Labs I have reviewed labs in epic and the results pertinent to this consultation are:   CBC    Component Value Date/Time   WBC 11.6 (H) 12/27/2018 1249   RBC 4.34 12/27/2018 1249   HGB 10.2 (L) 12/27/2018 1257   HGB 13.2 12/18/2017 0936   HCT 30.0 (L) 12/27/2018 1257   HCT 43.3 12/18/2017 0936   PLT 1,181 (HH) 12/27/2018 1249   PLT 446 12/18/2017 0936   MCV 65.2 (L) 12/27/2018 1249   MCV 81 12/18/2017 0936   MCH 17.1 (L) 12/27/2018 1249   MCHC 26.1 (L) 12/27/2018 1249   RDW 30.5 (H) 12/27/2018 1249   LYMPHSABS 4.2 (H) 12/27/2018 1249   LYMPHSABS 1.8 12/18/2017 0936   MONOABS 0.6 12/27/2018 1249   EOSABS 0.0 12/27/2018 1249   EOSABS 0.1 12/18/2017 0936   BASOSABS 0.1 12/27/2018 1249   BASOSABS 0.1 12/18/2017 0936    CMP     Component Value Date/Time   NA 139 12/27/2018 1257   K 3.5 12/27/2018 1257   CL 104 12/27/2018 1257   CO2 18 (L) 12/27/2018 1249   GLUCOSE 101 (H) 12/27/2018 1257   BUN 3 (L) 12/27/2018 1257   CREATININE 0.70 12/27/2018 1257   CALCIUM 9.3 12/27/2018 1249   PROT 7.5 12/27/2018 1249   ALBUMIN 4.0 12/27/2018 1249   AST 73 (H) 12/27/2018 1249   ALT 49 (H) 12/27/2018 1249   ALKPHOS 78 12/27/2018 1249   BILITOT 0.5 12/27/2018 1249   GFRNONAA >60 12/27/2018 1249   GFRAA >60 12/27/2018 1249    Lipid Panel     Component Value Date/Time   CHOL 52 11/12/2017 0219   TRIG 96 11/12/2017 0219   HDL 28 (L) 11/12/2017  0219   CHOLHDL 1.9 11/12/2017 0219   VLDL 19 11/12/2017 0219   LDLCALC 5 11/12/2017 0219     Imaging I have reviewed the images obtained:  CT-scan of the brain-aspects 10.  No bleed CTA head and neck-2 mm hypodense focus within the posterior aspect of the right carotid bulb suspicious for an adherent thrombus overlying the carotid web.  CTA head unremarkable CT perfusion unremarkable  Assessment: 43 year old with past history of right cerebral stroke last year secondary to possible right carotid dissection/ruptured plaque in 2019 with residual left hemiparesis which was very mild, hypertension, bipolar disorder not on any anticoagulation currently on antiplatelets presenting with last known normal  at noon with sudden onset of weakness of the left leg and giving out of the left leg while she is walking. She is also weak on the left arm and also has decreased sensation on the left which were the symptoms that had completely recovered after last stroke with the exception of the leg weakness-which has not completely recovered but now is much weaker than baseline. NIH stroke scale was 4 with a delta NIH of at least 3. CT head without bleed CTA head and neck shows a possible adherent thrombus on a carotid web which was not present on the CTA from 2019, which might be a source of an embolus I discussed the risks and benefits for IV TPA with her and she decided to go ahead with IV TPA understanding the risks and benefits.  I waited to obtain the vascular imaging prior to initiating TPA because I wanted to be sure that she was not having just a recrudescence of her old symptoms but given the new vascular finding of the adherent thrombus over the underlying carotid web, possibility of new embolization in the new event was high and hence TPA was used.  I discussed my rationale and findings with the patient as well Slight delay in TPA also because of blood pressure which was over the TPA  parameters-especially the diastolic blood pressure.  Required multiple doses of labetalol hydralazine and initiation of Cleviprex.  Impression: Acute ischemic stroke Other differentials include stroke symptom recrudescence  Recommendations: Admit to progressive floor if she does not have any needs for drips.  Currently requiring a drip see below.  If continues to require drip, will admit to ICU. MRI brain in 24 hours Blood pressure goal-below 180/105. No antiplatelets or anticoagulants Vitals and neurochecks per TPA protocol Currently requiring Cleviprex to maintain her diastolic below [id number]. We will attempt to give as needed dose of labetalol and hydralazine  2D echocardiogram A1c Lipid panel Treatment neurochecks PT OT Speech therapy Check chest x-ray Check urinalysis  Diet: N.p.o. until cleared by bedside stroke swallow evaluation FEN: Gentle hydration with 75 cc normal saline an hour CODE STATUS: Full code  -- Amie Portland, MD [facility] Pager: [phone] If 7pm to 7am, please call on call as listed on AMION.  CRITICAL CARE ATTESTATION Performed by: Amie Portland, MD Total critical care time: 45 minutes Critical care time was exclusive of separately billable procedures and treating other patients and/or supervising APPs/Residents/Students Critical care was necessary to treat or prevent imminent or life-threatening deterioration due to acute ischemic stroke, IV thrombolysis administration This patient is critically ill and at significant risk for neurological worsening and/or death and care requires constant monitoring. Critical care was time spent personally by me on the following activities: development of treatment plan with patient and/or surrogate as well as nursing, discussions with consultants, evaluation of patient's response to treatment, examination of patient, obtaining history from patient or surrogate, ordering and performing treatments and  interventions, ordering and review of laboratory studies, ordering and review of radiographic studies, pulse oximetry, re-evaluation of patient's condition, participation in multidisciplinary rounds and medical decision making of high complexity in the care of this patient.

## 2018-12-27 NOTE — Progress Notes (Addendum)
Called to evaluate the patient for worsening left sided weakness. She received tPA earlier today for stroke-like symptoms.   Exam reveals intact cognition with no agitation or aphasia. CN are intact with no gaze deviation or facial droop. LUE 4+/5 proximally, with normal grip strength. LLE with weak elevation of lower leg above bed to command, but unable to raise at hip. RUE and RLE 5/5. Sensation decreased to LLE.   A/R: 43 year old female s/p IV tPA for stroke-like symptoms involving her left side.  1. Acute worsening of left sided weakness while on the floor, several hours after completion of IV tPA 2. STAT MRI has been ordered for acute worsening of her left sided weakness. DDx includes hemorrhagic conversion and stroke extension.   Addendum: Preliminary review of MRI reveals no acute infarction or hemorrhage. Subtle prominence of some cortical veins over the right parietal convexity and sulci appears suggestive of hyperemia, which could be due to post-tPA luxury perfusion.   Electronically signed: Dr. Kerney Elbe

## 2018-12-27 NOTE — ED Notes (Signed)
[name] [phone]) would like a call back from patient.

## 2018-12-27 NOTE — Progress Notes (Signed)
Pt back from MRI, MRA. Gave PRN zofran for nausea and Tylenol for HA. No new symptoms. Will continue to monitor.

## 2018-12-28 ENCOUNTER — Inpatient Hospital Stay (HOSPITAL_COMMUNITY): Payer: Medicaid Other

## 2018-12-28 DIAGNOSIS — I6389 Other cerebral infarction: Secondary | ICD-10-CM

## 2018-12-28 DIAGNOSIS — R531 Weakness: Secondary | ICD-10-CM

## 2018-12-28 DIAGNOSIS — I639 Cerebral infarction, unspecified: Secondary | ICD-10-CM

## 2018-12-28 LAB — LIPID PANEL
Cholesterol: 76 mg/dL (ref 0–200)
HDL: 28 mg/dL — ABNORMAL LOW (ref >40)
LDL Cholesterol: 32 mg/dL (ref 0–99)
Total CHOL/HDL Ratio: 2.7 RATIO
Triglycerides: 81 mg/dL (ref <150)
VLDL: 16 mg/dL (ref 0–40)

## 2018-12-28 LAB — ECHOCARDIOGRAM COMPLETE
Height: 62 in
Weight: 3276.92 oz

## 2018-12-28 LAB — HEMOGLOBIN A1C
Hgb A1c MFr Bld: 5.6 % (ref 4.8–5.6)
Mean Plasma Glucose: 114.02 mg/dL

## 2018-12-28 LAB — HIV ANTIBODY (ROUTINE TESTING W REFLEX): HIV Screen 4th Generation wRfx: NONREACTIVE

## 2018-12-28 MED ORDER — ASPIRIN 325 MG PO TABS
325.0000 mg | ORAL_TABLET | Freq: Every day | ORAL | Status: DC
  Administered 2018-12-28 – 2018-12-30 (×3): 325 mg via ORAL
  Filled 2018-12-28 (×3): qty 1

## 2018-12-28 NOTE — Progress Notes (Signed)
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 She was walking her dogs have been suddenly her left leg went out and she also had weakness of the left arm. EMS was called because she has had a stroke that affected the left side of her body in 2019 and she recognized the symptoms. She reports having been sleepier prior over the past 4 days and had been celebrating on occasion and dancing around earlier in the morning. Denies any preceding illness sickness fevers chills shortness of breath nausea vomiting. Denies visual symptoms.   LKW:12 PM on 12/27/2018 tpa given?:Yes Premorbid modified Rankin scale (mRS):2   INTERVAL HISTORY Her partner is at bedside. We discussed episodes may be seizures, MRI was stable nothing acute.     OBJECTIVE Vitals:   12/28/18 1000 12/28/18 1100 12/28/18 1200 12/28/18 1300  BP: 125/87 124/80 140/79 (!) 155/97  Pulse: 80 80 85 93  Resp: 19 (!) 23 19 (!) 23  Temp:      TempSrc:      SpO2: 100% 99% 100% 100%  Weight:      Height:        CBC:  Recent Labs  Lab 12/27/18 1249 12/27/18 1257  WBC 11.6*  --   NEUTROABS 6.7  --   HGB 7.4* 10.2*  HCT 28.3* 30.0*  MCV 65.2*  --   PLT 1,181*  --     Basic Metabolic Panel:  Recent Labs  Lab 12/27/18 1249 12/27/18 1257  NA 136 139  K 3.5 3.5  CL 105 104  CO2 18*  --   GLUCOSE 103* 101*  BUN 5* 3*  CREATININE 0.81 0.70  CALCIUM 9.3  --     Lipid Panel:     Component Value Date/Time   CHOL 76 12/28/2018 1401   TRIG 81 12/28/2018 1401   HDL 28 (L) 12/28/2018 1401   CHOLHDL 2.7 12/28/2018 1401   VLDL 16 12/28/2018 1401   LDLCALC 32 12/28/2018 1401   HgbA1c:  Lab Results  Component Value Date   HGBA1C 5.6 12/28/2018   Urine Drug Screen:     Component Value Date/Time   LABOPIA NONE DETECTED 11/11/2017 0235   COCAINSCRNUR NONE DETECTED 11/11/2017 0235   LABBENZ NONE DETECTED 11/11/2017 0235   AMPHETMU NONE DETECTED 11/11/2017 0235   THCU NONE DETECTED 11/11/2017 0235   LABBARB NONE DETECTED 11/11/2017 0235    Alcohol Level     Component Value Date/Time   ETH <10 11/10/2017 2126    IMAGING  Ct Angio Head W Or Wo Contrast Ct Angio Neck W Or Wo Contrast Ct Cerebral Perfusion W Contrast 12/27/2018 IMPRESSION:  CTA neck:  1. 2 mm hypodense focus within the posterior aspect of the right carotid bulb suspicious for adherent thrombus. There may be an underlying carotid web at this site.  2. Common and internal carotid arteries widely patent within the neck without significant stenosis.  3. Bilateral vertebral arteries patent within the neck without significant stenosis.  CTA head:  No intracranial large vessel occlusion or proximal high-grade arterial stenosis.  CT perfusion head:  No core infarct is identified. No critically hypoperfused parenchyma is identified utilizing a Tmax>6 seconds threshold.  No mismatch reported.   Ct Head Wo Contrast 12/27/2018 IMPRESSION:  1. Stable CT appearance of the brain from earlier today. No acute intracranial hemorrhage or evolving infarct identified following IV tPA.  2. Subtle right hemisphere encephalomalacia as the sequelae of the scattered small 2019 infarcts.   Mr Angio Head Wo Contrast 12/27/2018 IMPRESSION:  Normal intracranial MRA.   Mr Brain Wo Contrast 12/27/2018 IMPRESSION:  1. No acute infarct identified.  2. No acute intracranial hemorrhage or other acute intracranial abnormality.  3. Small areas of encephalomalacia and hemosiderin related to multifocal right MCA territory infarcts which occurred in 2019.   Ct Head  Code Stroke Wo Contrast 12/27/2018 IMPRESSION:  A few subtle sites of cortical hypodensity within the right MCA territory appear to correspond with previous infarcts. No definite acute infarct identified. ASPECTS 10.    Transthoracic Echocardiogram  12/28/2018 IMPRESSIONS  1. Left ventricular ejection fraction, by visual estimation, is 60 to 65%. The left ventricle has normal function. There is no left ventricular hypertrophy.  2. Global right ventricle has normal systolic function.The right ventricular size is normal. No increase in right ventricular wall thickness.  3. Left atrial size was normal.  4. Right atrial size was normal.  5. The mitral valve is normal in structure. Trace mitral valve regurgitation. No evidence of mitral stenosis.  6. The tricuspid valve is normal in structure. Tricuspid valve regurgitation is trivial.  7. The aortic valve is normal in structure. Aortic valve regurgitation is not visualized. No evidence of aortic valve sclerosis or stenosis.  8. The pulmonic valve was normal in structure. Pulmonic valve regurgitation is not visualized.  9. Mildly elevated pulmonary artery systolic pressure. 10. The inferior vena cava is normal in size with greater than 50% respiratory variability, suggesting right atrial pressure of 3 mmHg.   ECG - SR rate 82 BPM. (See cardiology reading for complete details)   EEG - pending   PHYSICAL EXAM Blood pressure (!) 155/97, pulse 93, temperature 99 F (37.2 C), temperature source Oral, resp. rate (!) 23, height 5\' 2"  (1.575 m), weight 92.9 kg, SpO2 100 %.  Exam: NAD, pleasant                  Speech:    Speech is normal; fluent and spontaneous with normal comprehension.  Cognition:    The patient is oriented to person, place, and time;     recent and remote memory intact;     language fluent;    Cranial Nerves:    The pupils are equal, round, and reactive to light.Trigeminal sensation is intact and the muscles of mastication  are normal. The face is symmetric. The palate elevates in the midline. Hearing intact. Voice is normal. Shoulder shrug is normal. The tongue has normal motion without fasciculations.   Coordination:  No dysmetria  Motor Observation:    No asymmetry, no atrophy, and no involuntary movements noted. Tone:    Normal muscle tone.     Strength: Patient initially could not move left leg and arm, after several minutes was anti-gravity.      Sensation: decreased left arm and leg  ASSESSMENT/PLAN Ms. GLORISTINE KOSTA is a 43 y.o. 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  She received IV tPA Saturday 12/27/2018 @ 1330.  Suspected stroke treated  with tPA. MRI negative. Episodes of left-sided weakness and sensory changes may be seizures however CTA shows possible residual vs new thrombus or carotid web.   Code Stroke CT Head - A few subtle sites of cortical hypodensity within the right MCA territory appear to correspond with previous infarcts. No definite acute infarct identified. ASPECTS 10.   CT head - Stable CT appearance of the brain from earlier today. No acute intracranial hemorrhage or evolving infarct identified following IV tPA.   MRI head  - no acute findings.  MRA head - normal  CTA H&N - 2 mm hypodense focus within the posterior aspect of the right carotid bulb suspicious for adherent thrombus. There may be an underlying carotid web at this site.   CT Perfusion - No core infarct is identified. No critically hypoperfused parenchyma is identified utilizing a Tmax>6 seconds threshold. No mismatch reported.   Carotid Doppler - CTA neck performed - carotid dopplers not indicated.  2D Echo - EF 60 - 65%. No cardiac  source of emboli identified.   Sars Corona Virus 2 - negative  LDL - 32  HgbA1c - 5.6  UDS - not ordered  VTE prophylaxis - SCDs Diet  Diet Order            Diet Heart Room service appropriate? Yes; Fluid consistency: Thin  Diet effective now              aspirin 81 mg daily prior to admission, now on No antithrombotic s/p tPA, will restart ASA after CT head results.  Patient counseled to be compliant with her antithrombotic medications  Ongoing aggressive stroke risk factor management  Therapy recommendations:  pending  Disposition:  Pending  Hypertension  Home BP meds: none   Current BP meds: none   Stable . Permissive hypertension (OK if < 220/120) but gradually normalize in 5-7 days  . Long-term BP goal normotensive  Hyperlipidemia  Home Lipid lowering medication: none  LDL 32, goal < 70  Current lipid lowering medication: none  Continue statin at discharge   Other Stroke Risk Factors  ETOH use, advised to drink no more than 1 alcoholic beverage per day.  Obesity, Body mass index is 37.46 kg/m., recommend weight loss, diet and exercise as appropriate   Hx stroke/TIA  Family hx stroke (father and aunt)   Other Active Problems  Anemia - Hb - 7.4->10.2  Mild Leukocytosis - 11.6 (afebrile)  Thrombocytosis - 1,181  PLAN  Recheck labs in AM  Start antiplatelet therapy  EEG pending  Discussed with Dr. Estanislado Pandy, will let primary team (Dr. Leonie Man) evaluate in the morning if  Consider cerebral angiogram to further examine the carotid is warranted and/or TEE and loop   Hospital day # 1  Personally examined patient and images, and have participated in and made any corrections needed to history, physical, neuro exam,assessment and plan as stated above.  I have personally obtained the history, evaluated lab date, reviewed imaging studies and agree with radiology interpretations.    Sarina Ill, MD Stroke Neurology   A total of 35  minutes was spent for the care of this patient, spent on counseling patient and family on different diagnostic and therapeutic options, counseling and coordination of care, riskd ans benefits of management, compliance, or risk factor reduction and education.   To contact Stroke Continuity provider, please refer to [email or web address] After hours, contact General Neurology

## 2018-12-28 NOTE — Procedures (Signed)
Patient Name: Mallory Robinson  MRN: [id number]  Epilepsy Attending: Lora Havens  Referring Physician/Provider: Dr Amie [place] Date: 12/28/2018 Duration: 25.06 mins  Patient history: 43yo F with sudden onset of left sided weakness. EEG to evaluate for seizure.  Level of alertness: awake, drowsy  AEDs during EEG study: None  Technical aspects: This EEG study was done with scalp electrodes positioned according to the 10-20 International system of electrode placement. Electrical activity was acquired at a sampling rate of 500Hz  and reviewed with a high frequency filter of 70Hz  and a low frequency filter of 1Hz . EEG data were recorded continuously and digitally stored.   DESCRIPTION: During awake state, the posterior dominant rhythm consists of 9-10 Hz activity of moderate voltage (25-35 uV) seen predominantly in posterior head regions, symmetric and reactive to eye opening and eye closing.  Drowsiness was characterized by attenuation of the posterior background rhythm. Hyperventilation and photic stimulation were not performed.  IMPRESSION: This study is within normal limits. No seizures or epileptiform discharges were seen throughout the recording.   However, only wakefulness and drowsiness were recorded. If suspicion for interictal activity remains a concern, a prolonged study including sleep can be considered.   [name] Barbra Sarks

## 2018-12-28 NOTE — Progress Notes (Signed)
EEG complete - results pending 

## 2018-12-28 NOTE — Progress Notes (Signed)
PT Cancellation Note  Patient Details Name: SHAYANN MEDLOCK MRN: [id number] DOB: 01/20/1976   Cancelled Treatment:    Reason Eval/Treat Not Completed: Active bedrest order Pt remains with strict bedrest order 24 hours post IV tPA. Per RN, [place], awaiting neurology recommendations for further assessment.   Ellamae Sia, PT, DPT Acute Rehabilitation Services Pager [phone] Office [phone]    Willy Eddy 12/28/2018, 1:21 PM

## 2018-12-28 NOTE — Progress Notes (Signed)
  Echocardiogram 2D Echocardiogram has been performed.  Mallory Robinson 12/28/2018, 9:06 AM

## 2018-12-28 NOTE — Plan of Care (Signed)
Patient stable, discussed POC with patient, agreeable with plan, denies question/concerns at this time.   Problem: Education: Goal: Knowledge of disease or condition will improve Outcome: Progressing

## 2018-12-28 NOTE — Progress Notes (Signed)
OT Cancellation Note  Patient Details Name: Mallory Robinson MRN: [id number] DOB: 1975-12-26   Cancelled Treatment:    Reason Eval/Treat Not Completed: Other (comment)(Neurologist stating to hold OT/PT today.)  After speaking with neurologist, pt requires a day of rest and for therapy to start tomorrow. Possible seizure activity. OT to follow-up for OT eval next available treatment time.  [name] Harold Hedge) Marsa Aris OTR/L Acute Rehabilitation Services Pager: [phone] Office: [place] 12/28/2018, 1:44 PM

## 2018-12-29 ENCOUNTER — Inpatient Hospital Stay (HOSPITAL_COMMUNITY): Payer: Medicaid Other

## 2018-12-29 DIAGNOSIS — I169 Hypertensive crisis, unspecified: Secondary | ICD-10-CM

## 2018-12-29 LAB — BASIC METABOLIC PANEL
Anion gap: 8 (ref 5–15)
BUN: <5 mg/dL — ABNORMAL LOW (ref 6–20)
CO2: 20 mmol/L — ABNORMAL LOW (ref 22–32)
Calcium: 8.4 mg/dL — ABNORMAL LOW (ref 8.9–10.3)
Chloride: 108 mmol/L (ref 98–111)
Creatinine, Ser: 0.81 mg/dL (ref 0.44–1.00)
GFR calc Af Amer: >60 mL/min (ref >60)
GFR calc non Af Amer: >60 mL/min (ref >60)
Glucose, Bld: 97 mg/dL (ref 70–99)
Potassium: 3.7 mmol/L (ref 3.5–5.1)
Sodium: 136 mmol/L (ref 135–145)

## 2018-12-29 LAB — CBC
HCT: 25 % — ABNORMAL LOW (ref 36.0–46.0)
Hemoglobin: 6.7 g/dL — CL (ref 12.0–15.0)
MCH: 17.2 pg — ABNORMAL LOW (ref 26.0–34.0)
MCHC: 26.8 g/dL — ABNORMAL LOW (ref 30.0–36.0)
MCV: 64.1 fL — ABNORMAL LOW (ref 80.0–100.0)
Platelets: 848 10*3/uL — ABNORMAL HIGH (ref 150–400)
RBC: 3.9 MIL/uL (ref 3.87–5.11)
RDW: 30.1 % — ABNORMAL HIGH (ref 11.5–15.5)
WBC: 9.7 10*3/uL (ref 4.0–10.5)
nRBC: 0 % (ref 0.0–0.2)

## 2018-12-29 LAB — HEMOGLOBIN AND HEMATOCRIT, BLOOD
HCT: 24.9 % — ABNORMAL LOW (ref 36.0–46.0)
Hemoglobin: 6.7 g/dL — CL (ref 12.0–15.0)

## 2018-12-29 MED ORDER — TOPIRAMATE 25 MG PO TABS
50.0000 mg | ORAL_TABLET | Freq: Two times a day (BID) | ORAL | Status: DC
  Administered 2018-12-29 – 2018-12-30 (×2): 50 mg via ORAL
  Filled 2018-12-29 (×3): qty 2

## 2018-12-29 MED ORDER — TOPIRAMATE 25 MG PO TABS: 50.0000 mg | ORAL_TABLET | Freq: Every day | ORAL | Status: DC

## 2018-12-29 MED ORDER — ENOXAPARIN SODIUM 40 MG/0.4ML ~~LOC~~ SOLN: 40.0000 mg | Freq: Every day | SUBCUTANEOUS | Status: DC

## 2018-12-29 MED ORDER — PANTOPRAZOLE SODIUM 40 MG PO TBEC
40.0000 mg | DELAYED_RELEASE_TABLET | Freq: Every day | ORAL | Status: DC
  Administered 2018-12-29: 40 mg via ORAL
  Filled 2018-12-29: qty 1

## 2018-12-29 NOTE — Progress Notes (Deleted)
STROKE TEAM PROGRESS NOTE   INTERVAL HISTORY Mallory Robinson is lying in the bed, NAD.  No voiced concerns  Vitals:   12/29/18 0330 12/29/18 0800 12/29/18 0836 12/29/18 1227  BP: 111/61  137/85 132/85  Pulse: 73 78 86 77  Resp: 20 17 18 19   Temp: 98.8 F (37.1 C)  98.7 F (37.1 C) 99.1 F (37.3 C)  TempSrc: Oral  Oral Oral  SpO2: 100% 100% 100% 100%  Weight:      Height:        CBC:  Recent Labs  Lab 12/27/18 1249  12/29/18 0419 12/29/18 0654  WBC 11.6*  --  9.7  --   NEUTROABS 6.7  --   --   --   HGB 7.4*   < > 6.7* 6.7*  HCT 28.3*   < > 25.0* 24.9*  MCV 65.2*  --  64.1*  --   PLT 1,181*  --  848*  --    < > = values in this interval not displayed.    Basic Metabolic Panel:  Recent Labs  Lab 12/27/18 1249 12/27/18 1257 12/29/18 0419  NA 136 139 136  K 3.5 3.5 3.7  CL 105 104 108  CO2 18*  --  20*  GLUCOSE 103* 101* 97  BUN 5* 3* <5*  CREATININE 0.81 0.70 0.81  CALCIUM 9.3  --  8.4*   Lipid Panel:     Component Value Date/Time   CHOL 76 12/28/2018 1401   TRIG 81 12/28/2018 1401   HDL 28 (L) 12/28/2018 1401   CHOLHDL 2.7 12/28/2018 1401   VLDL 16 12/28/2018 1401   LDLCALC 32 12/28/2018 1401   HgbA1c:  Lab Results  Component Value Date   HGBA1C 5.6 12/28/2018   Urine Drug Screen:     Component Value Date/Time   LABOPIA NONE DETECTED 11/11/2017 0235   COCAINSCRNUR NONE DETECTED 11/11/2017 0235   LABBENZ NONE DETECTED 11/11/2017 0235   AMPHETMU NONE DETECTED 11/11/2017 0235   THCU NONE DETECTED 11/11/2017 0235   LABBARB NONE DETECTED 11/11/2017 0235    Alcohol Level     Component Value Date/Time   ETH <10 11/10/2017 2126    IMAGING Ct Head Wo Contrast  Result Date: 12/28/2018 CLINICAL DATA:  43 year old female code stroke presentation yesterday status post IV tPA. Previous right MCA territory infarcts in 2019. EXAM: CT HEAD WITHOUT CONTRAST TECHNIQUE: Contiguous axial images were obtained from the base of the skull through the vertex without  intravenous contrast. COMPARISON:  Head CT, MRI, MRA and CTA yesterday. FINDINGS: Brain: Continued stable gray-white matter differentiation. Subtle areas of encephalomalacia in the right MCA territory as described by CT and MRI yesterday. No midline shift, ventriculomegaly, mass effect, evidence of mass lesion, intracranial hemorrhage or evidence of cortically based acute infarction. Vascular: No suspicious intracranial vascular hyperdensity. Skull: No acute osseous abnormality identified. Sinuses/Orbits: Visualized paranasal sinuses and mastoids are stable and well pneumatized. Other: Visualized orbits and scalp soft tissues are within normal limits. IMPRESSION: 1. Continued stable non contrast CT appearance of the brain. Small areas of right MCA territory encephalomalacia. 2. No new intracranial abnormality. Electronically Signed   By: Genevie Ann M.D.   On: 12/28/2018 16:47   Ct Head Wo Contrast  Result Date: 12/27/2018 CLINICAL DATA:  43 year old female status post IV tPA. Code stroke presentation earlier today. EXAM: CT HEAD WITHOUT CONTRAST TECHNIQUE: Contiguous axial images were obtained from the base of the skull through the vertex without intravenous contrast. COMPARISON:  Head  CT 1245 hours, CTA and CTP earlier today. Brain MRI 11/11/2017. FINDINGS: Brain: No acute intracranial hemorrhage identified. No midline shift, mass effect, or evidence of intracranial mass lesion. No ventriculomegaly. Scattered small foci of ischemia in the right hemisphere, predominantly the MCA territory on the 2019 MRI, with little to no associated encephalomalacia evident by CT (right parietal lobe series 3, image 22). Stable gray-white matter differentiation throughout the brain. No cortically based acute infarct identified. Vascular: Faint residual intravascular contrast from earlier today. Skull: No acute osseous abnormality identified. Sinuses/Orbits: Visualized paranasal sinuses and mastoids are clear. Other: Negative orbit  and scalp soft tissues. IMPRESSION: 1. Stable CT appearance of the brain from earlier today. No acute intracranial hemorrhage or evolving infarct identified following IV tPA. 2. Subtle right hemisphere encephalomalacia as the sequelae of the scattered small 2019 infarcts. Electronically Signed   By: Genevie Ann M.D.   On: 12/27/2018 15:48   Mr Angio Head Wo Contrast  Result Date: 12/27/2018 CLINICAL DATA:  Initial evaluation for focal neural deficit, stroke. EXAM: MRA HEAD WITHOUT CONTRAST TECHNIQUE: Angiographic images of the Circle of Willis were obtained using MRA technique without intravenous contrast. COMPARISON:  Prior MRI and CTA from earlier the same day. FINDINGS: ANTERIOR CIRCULATION: Distal cervical segments of the internal carotid arteries are widely patent with symmetric antegrade flow. Petrous, cavernous, and supraclinoid ICAs patent without stenosis or other abnormality. A1 segments, anterior communicating artery common anterior cerebral arteries widely patent. Middle cerebral arteries well perfused without stenosis or other abnormality. POSTERIOR CIRCULATION: Both vertebral arteries widely patent to the vertebrobasilar junction. Posterior inferior cerebral arteries patent proximally. Basilar widely patent to its distal aspect without stenosis. Superior cerebellar and posterior cerebral arteries both well perfused and widely patent. No intracranial aneurysm. IMPRESSION: Normal intracranial MRA. Electronically Signed   By: Jeannine Boga M.D.   On: 12/27/2018 22:14   Mr Brain Wo Contrast  Result Date: 12/27/2018 CLINICAL DATA:  43 year old female code stroke presentation today, left side weakness. Adherent thrombus in the right carotid bulb on CTA. Status post IV tPA earlier today. EXAM: MRI HEAD WITHOUT CONTRAST TECHNIQUE: Multiplanar, multiecho pulse sequences of the brain and surrounding structures were obtained without intravenous contrast. COMPARISON:  Post tPA head CT 1532 hours today,  and earlier. FINDINGS: Brain: No convincing restricted diffusion. Fairly subtle encephalomalacia scattered in the right hemisphere corresponding to multifocal small areas of mostly cortical and subcortical white matter ischemia on a 11/11/2017 brain MRI. Involvement of the right caudate nucleus also. More conspicuous chronic hemosiderin in those areas on susceptibility weighted imaging. Elsewhere gray and white matter signal remains normal. No other chronic cerebral blood products. No midline shift, mass effect, evidence of mass lesion, ventriculomegaly, extra-axial collection or acute intracranial hemorrhage. Cervicomedullary junction and pituitary are within normal limits. Brainstem and cerebellum remain within normal limits. Vascular: Major intracranial vascular flow voids are stable since 2019. Skull and upper cervical spine: Negative visible cervical spine. Stable bone marrow signal. Sinuses/Orbits: Mildly Disconjugate gaze, otherwise negative orbits. Paranasal sinuses and mastoids remain clear. Other: Visible internal auditory structures appear normal. Scalp and face soft tissues appear negative. IMPRESSION: 1. No acute infarct identified. 2. No acute intracranial hemorrhage or other acute intracranial abnormality. 3. Small areas of encephalomalacia and hemosiderin related to multifocal right MCA territory infarcts which occurred in 2019. Electronically Signed   By: Genevie Ann M.D.   On: 12/27/2018 21:37   Vas US Carotid  Result Date: 12/29/2018 Carotid Arterial Duplex Study Indications:  CVA. Risk Factors:      Hypertension, prior CVA. Limitations        Today's exam was limited due to vessel tortuousity. Comparison Study:  11/11/17 previous Performing Technologist: Abram Sander RVS  Examination Guidelines: A complete evaluation includes B-mode imaging, spectral Doppler, color Doppler, and power Doppler as needed of all accessible portions of each vessel. Bilateral testing is considered an integral part  of a complete examination. Limited examinations for reoccurring indications may be performed as noted.  Right Carotid Findings: +----------+--------+--------+--------+------------------+--------+           PSV cm/sEDV cm/sStenosisPlaque DescriptionComments +----------+--------+--------+--------+------------------+--------+ CCA Prox  109     22              heterogenous               +----------+--------+--------+--------+------------------+--------+ CCA Distal67      21              heterogenous               +----------+--------+--------+--------+------------------+--------+ ICA Prox  122     67      60-79%  heterogenous      tortuous +----------+--------+--------+--------+------------------+--------+ ICA Mid   146     78                                tortuous +----------+--------+--------+--------+------------------+--------+ ICA Distal112     57                                tortuous +----------+--------+--------+--------+------------------+--------+ ECA       93      19                                         +----------+--------+--------+--------+------------------+--------+ +----------+--------+-------+--------+-------------------+           PSV cm/sEDV cmsDescribeArm Pressure (mmHG) +----------+--------+-------+--------+-------------------+ [id number]                                         +----------+--------+-------+--------+-------------------+ +---------+--------+--+--------+--+---------+ VertebralPSV cm/s86EDV cm/s43Antegrade +---------+--------+--+--------+--+---------+  Left Carotid Findings: +----------+--------+--------+--------+------------------+--------+           PSV cm/sEDV cm/sStenosisPlaque DescriptionComments +----------+--------+--------+--------+------------------+--------+ CCA Prox  121     37              heterogenous               +----------+--------+--------+--------+------------------+--------+ CCA  Distal57      23              heterogenous               +----------+--------+--------+--------+------------------+--------+ ICA Prox  131     68      60-79%  heterogenous      tortuous +----------+--------+--------+--------+------------------+--------+ ICA Mid   151     72                                tortuous +----------+--------+--------+--------+------------------+--------+ ICA Distal202     88  tortuous +----------+--------+--------+--------+------------------+--------+ ECA       83      24                                         +----------+--------+--------+--------+------------------+--------+ +----------+--------+--------+--------+-------------------+           PSV cm/sEDV cm/sDescribeArm Pressure (mmHG) +----------+--------+--------+--------+-------------------+ [id number]                                          +----------+--------+--------+--------+-------------------+ +---------+--------+--+--------+--+---------+ VertebralPSV cm/s77EDV cm/s39Antegrade +---------+--------+--+--------+--+---------+  Summary: Right Carotid: Velocities in the right ICA are consistent with a 60-79%                stenosis. Left Carotid: Velocities in the left ICA are consistent with a 60-79% stenosis. Vertebrals: Bilateral vertebral arteries demonstrate antegrade flow. *See table(s) above for measurements and observations.  Electronically signed by Antony Contras MD on 12/29/2018 at 1:52:45 PM.    Final     PHYSICAL EXAM   ASSESSMENT/PLAN Mallory Robinson is a 43 y.o. 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 She was  walking her dogs have been suddenly her left leg went out and she also had weakness of the left arm. EMS was called because she has had a stroke that affected the left side of her body in 2019 and she recognized the symptoms.  LKN 12/27/2018 @ 12pm, Received IV TPA    Per chart review right MCA and ACA and right caudate head, BG/CR infarcts embolic secondary to right distal CCA/proximal ICA thrombus rupture with unclear etiology but could be due to carotid dissection versus thrombocythemia on 11/10/2017.  Bilateral carotid Doppler showed bilateral ICA stenosis of 40 to 59% with VAS antegrade.    Complex Migraine vs TIA  - start Topamax 50 mg daily  -EEG negative for seizures   Chronic ICA thrombus   CT head S/P IV TPA no acute hemorrhage or infarct   Code Stroke CT head 12/27/2018-A few subtle sites of cortical hypodensity within the right MCA territory appear to correspond with previous infarcts. No definite acute infarct identified. ASPECTS 10.   CTA head 12/27/2018--No intracranial large vessel occlusion or proximal high-grade arterial stenosis  CTA neck 12/27/2018-2 mm hypodense focus within the posterior aspect of the right carotid bulb suspicious for adherent thrombus. There may be an underlying carotid web at this site.  2. Common and internal carotid arteries widely patent within the neck without significant stenosis.   3. Bilateral vertebral arteries patent within the neck without significant stenosis  CT perfusion 12/27/2018--No core infarct is identified. No critically hypoperfused parenchyma is identified utilizing a Tmax>6 seconds threshold. No mismatch reported.   MRI Brain  12/27/2018-No acute infarct or hemorrhage. Small areas of encephalomalacia and hemosiderin related to multifocal right MCA territory infarcts which occurred in 2019.   MRA  12/27/2018 normal  Carotid Doppler  12/29/2018--Right Carotid: Velocities in the right ICA are consistent with a 60-79% stenosis. Left  Carotid: Velocities in the left ICA are consistent with a 60-79% stenosis. Vertebrals: Bilateral vertebral arteries demonstrate  antegrade flow.   2D Echo 12/28/2018 -1. Left ventricular ejection fraction, by visual estimation, is 60 to 65%. The left ventricle has normal function. There is no left ventricular hypertrophy.  2. Global right ventricle has normal systolic function.The right ventricular size is normal. No increase in right ventricular wall thickness.  3. Left atrial size was normal.  4. Right atrial size was normal.  5. The mitral valve is normal in structure. Trace mitral valve regurgitation. No evidence of mitral stenosis.  6. The tricuspid valve is normal in structure. Tricuspid valve regurgitation is trivial.  7. The aortic valve is normal in structure. Aortic valve regurgitation is not visualized. No evidence of aortic valve sclerosis or stenosis.  8. The pulmonic valve was normal in structure. Pulmonic valve regurgitation is not visualized.  9. Mildly elevated pulmonary artery systolic pressure. 10. The inferior vena cava is normal in size with greater than 50% respiratory variability, suggesting right atrial pressure of 3 mmHg.  LDL 32  HgbA1c 5.6  Lovenox 40 mg Daily, SCd's for VTE prophylaxis    Diet   Diet Heart Room service appropriate? Yes; Fluid consistency: Thin     aspirin 81 mg daily prior to admission, now on aspirin 325 mg daily.   Therapy recommendations:  -PT/OT --pending  -ST -recommends Outpatient follow up   Disposition:  pending  Hypertension  Home meds:  None  Current BP meds none   Stable  . Permissive hypertension (OK if < 220/120) but gradually normalize in 5-7 days . Long-term BP goal normotensive  Hyperlipidemia  Home meds:  None   LDL 32, goal < 70  Other Stroke Risk Factors   ETOH use, alcohol level <10, advised to drink no more than 1 drink(s) a day  Obesity, Body mass index is 37.46 kg/m., recommend weight loss,  diet and exercise as appropriate   Hx stroke/TIA  Family hx stroke (father and aunt)  Migraines   Other Active Problems  Chronic anemia 6.7 (7.4)   Hospital day # 2  Beulah Gandy, NP   To contact Stroke Continuity provider, please refer to [email or web address] After hours, contact General Neurology

## 2018-12-29 NOTE — Progress Notes (Signed)
PT Cancellation Note  Patient Details Name: Mallory Robinson MRN: [id number] DOB: 11-Feb-1976   Cancelled Treatment:    Reason Eval/Treat Not Completed: Active bedrest order  Verdene Lennert, PT, DPT  Acute Rehabilitation [phone] pager [phone] office  @ [facility]: [phone]   [name] 12/29/2018, 1:57 PM

## 2018-12-29 NOTE — Progress Notes (Signed)
Carotid duplex has been completed.   Preliminary results in CV Proc.   Abram Sander 12/29/2018 9:51 AM

## 2018-12-29 NOTE — Evaluation (Signed)
Physical Therapy Evaluation Patient Details Name: Mallory Robinson MRN: [id number] DOB: [birth year] Today's Date: 12/29/2018   History of Present Illness  Pt is a 43 yo female s/p stroke like symptoms with weakness on L side s/p tPA at 1:13p. MRI brain: negative for acute event. PMHx: Bipolar1, depression, HTN,  and CVA with left sided weakness.  Clinical Impression  Pt mildly off balance during gait and would benefit from using her cane again for gait stability especially community access.  She would benefit from OP PT for balance and gait training.  She would like to return to not needing an AD.  PT will continue to follow acutely for safe mobility progression.  Bring a cane next session to practice gait with a cane.    Follow Up Recommendations Supervision - Intermittent;Outpatient PT    Equipment Recommendations  None recommended by PT(pt already has recommended DME of a cane)    Recommendations for Other Services   NA    Precautions / Restrictions Precautions Precautions: Fall Precaution Comments: mild L sided weakness      Mobility  Bed Mobility Overal bed mobility: Independent             General bed mobility comments: Pt was OOB in the recliner chair.   Transfers Overall transfer level: Needs assistance Equipment used: None Transfers: Sit to/from Stand Sit to Stand: Supervision         General transfer comment: close supervision for safety, reliance on hands for transitions.   Ambulation/Gait Ambulation/Gait assistance: Supervision Gait Distance (Feet): 150 Feet Assistive device: None Gait Pattern/deviations: Step-through pattern;Steppage Gait velocity: decreased Gait velocity interpretation: 1.31 - 2.62 ft/sec, indicative of limited community ambulator General Gait Details: Pt with steppage pattern on the left, but no significant LOB requiring assist to recover.  I spoke with pt and her wife re: my recommendation using a cane.   Stairs Stairs: Yes Stairs  assistance: Supervision Stair Management: One rail Right;Step to pattern;Forwards Number of Stairs: 5(x2) General stair comments: Encouraged step to gait pattern for safety on the stairs leading up with stronger leg and down with weaker leg.  Supervision for safety.   Wheelchair Mobility    Modified Rankin (Stroke Patients Only) Modified Rankin (Stroke Patients Only) Pre-Morbid Rankin Score: No symptoms Modified Rankin: Moderately severe disability     Balance Overall balance assessment: Needs assistance Sitting-balance support: Feet supported;No upper extremity supported Sitting balance-Leahy Scale: Good     Standing balance support: No upper extremity supported Standing balance-Leahy Scale: Good                               Pertinent Vitals/Pain Pain Assessment: No/denies pain    Home Living Family/patient expects to be discharged to:: Private residence Living Arrangements: Spouse/significant other;Children Available Help at Discharge: Family;Available 24 hours/day Type of Home: House Home Access: Level entry     Home Layout: Two level;Bed/bath upstairs Home Equipment: Tub bench;Grab bars - tub/shower;Walker - 2 wheels;Cane - single point      Prior Function Level of Independence: Independent         Comments: Works from home as a Engineer, manufacturing. Just recently stop using a cane for mobility.      Hand Dominance   Dominant Hand: Right    Extremity/Trunk Assessment   Upper Extremity Assessment Upper Extremity Assessment: Defer to OT evaluation LUE Deficits / Details: Slight weakness compared to right    Lower Extremity Assessment  Lower Extremity Assessment: LLE deficits/detail LLE Deficits / Details: left leg with 4/5 strength at hip, knee, 3+/5 at ankle PF/DF LLE Sensation: decreased light touch(but able to localize to LT)    Cervical / Trunk Assessment Cervical / Trunk Assessment: Normal  Communication   Communication: No  difficulties  Cognition Arousal/Alertness: Awake/alert Behavior During Therapy: WFL for tasks assessed/performed Overall Cognitive Status: Impaired/Different from baseline Area of Impairment: Memory;Problem solving                     Memory: Decreased short-term memory       Problem Solving: Requires verbal cues General Comments: Self reports decreased STM.  Not formally tested by PT. See SLP note.       General Comments General comments (skin integrity, edema, etc.): HR 120s during gait.         Assessment/Plan    PT Assessment Patient needs continued PT services  PT Problem List Decreased strength;Decreased activity tolerance;Decreased balance;Decreased mobility;Decreased knowledge of use of DME;Decreased knowledge of precautions;Cardiopulmonary status limiting activity;Impaired sensation       PT Treatment Interventions DME instruction;Gait training;Stair training;Functional mobility training;Therapeutic activities;Therapeutic exercise;Balance training;Neuromuscular re-education;Cognitive remediation;Patient/family education    PT Goals (Current goals can be found in the Care Plan section)  Acute Rehab PT Goals Patient Stated Goal: "Go home soon" PT Goal Formulation: With patient Time For Goal Achievement: 01/12/19 Potential to Achieve Goals: Good    Frequency Min 4X/week           AM-PAC PT "6 Clicks" Mobility  Outcome Measure Help needed turning from your back to your side while in a flat bed without using bedrails?: None Help needed moving from lying on your back to sitting on the side of a flat bed without using bedrails?: None Help needed moving to and from a bed to a chair (including a wheelchair)?: None Help needed standing up from a chair using your arms (e.g., wheelchair or bedside chair)?: None Help needed to walk in hospital room?: None Help needed climbing 3-5 steps with a railing? : None 6 Click Score: 24    End of Session   Activity  Tolerance: Patient limited by fatigue Patient left: in chair;with call bell/phone within reach;with family/visitor present Nurse Communication: Mobility status PT Visit Diagnosis: Muscle weakness (generalized) (M62.81);Difficulty in walking, not elsewhere classified (R26.2);Hemiplegia and hemiparesis Hemiplegia - Right/Left: Left Hemiplegia - dominant/non-dominant: Non-dominant Hemiplegia - caused by: Unspecified    Time: [id number] PT Time Calculation (min) (ACUTE ONLY): 25 min   Charges:           Verdene Lennert, PT, DPT  Acute Rehabilitation [phone] pager #[phone] office  @ [facility]: [phone]   PT Evaluation $PT Eval Moderate Complexity: 1 Mod PT Treatments $Gait Training: 8-22 mins        12/29/2018, 5:58 PM

## 2018-12-29 NOTE — Evaluation (Signed)
Occupational Therapy Evaluation Patient Details Name: Mallory Robinson MRN: [id number] DOB: 1975-07-31 Today's Date: 12/29/2018    History of Present Illness Pt is a 43 yo female s/p stroke like symptoms with weakness on L side s/p tPA at 1:13p. MRI brain: negative for acute event. PMHx: Bipolar1, depression, HTN,  and CVA with left sided weakness.   Clinical Impression   PTA, pt was living with her wife and son and was independent with ADLs and working from home; just stopped using a cane for functional mobility. Pt very motivated to participate in therapy. Pt requiring Supervision for ADLs and Min Guard A for functional mobility. Pt reporting that her left side feels heavy. Pt presenting with decreased ST memory and coordination with gait pattern. Discussed OP dc options; pt verbalizing she will go to OP SLP for cognition/memory and OP PT for mobility (as recommended by SLP and PT). Recommend dc to home with follow up at Neuro OP. All acute OT needs provided and will sign off.    Follow Up Recommendations  No OT follow up;Supervision - Intermittent(Plan for OP SLP and OP PT)    Equipment Recommendations  None recommended by OT    Recommendations for Other Services PT consult;Speech consult     Precautions / Restrictions Precautions Precautions: Fall      Mobility Bed Mobility Overal bed mobility: Independent                Transfers Overall transfer level: Needs assistance   Transfers: Sit to/from Stand Sit to Stand: Supervision         General transfer comment: Supervision for safety    Balance Overall balance assessment: Mild deficits observed, not formally tested                                         ADL either performed or assessed with clinical judgement   ADL Overall ADL's : Needs assistance/impaired Eating/Feeding: Independent;Sitting   Grooming: Oral care;Supervision/safety;Set up;Standing   Upper Body Bathing: Set up;Sitting    Lower Body Bathing: Min guard;Sit to/from stand   Upper Body Dressing : Set up;Sitting   Lower Body Dressing: Min guard;Sit to/from stand   Toilet Transfer: Supervision/safety;Ambulation(simulated to reciner ) Armed forces technical officer Details (indicate cue type and reason): Supervision for safety     Tub/ Shower Transfer: Supervision/safety;Min guard;Ambulation;Tub transfer Tub/Shower Transfer Details (indicate cue type and reason): Pt performing tub transfer with Min Guard-Supervision demosntrating WFL balance Functional mobility during ADLs: Min guard General ADL Comments: Pt presenting with decreased balance, cognition deficits, and reporting that her left side feels "heavy".      Vision Baseline Vision/History: Wears glasses(Had left neglect prior) Patient Visual Report: No change from baseline Additional Comments: Reporting no visual changes. Scanning approriately     Perception     Praxis      Pertinent Vitals/Pain Pain Assessment: No/denies pain     Hand Dominance Right   Extremity/Trunk Assessment Upper Extremity Assessment Upper Extremity Assessment: LUE deficits/detail LUE Deficits / Details: Slight weakness compared to right   Lower Extremity Assessment Lower Extremity Assessment: Defer to PT evaluation   Cervical / Trunk Assessment Cervical / Trunk Assessment: Normal   Communication Communication Communication: No difficulties   Cognition Arousal/Alertness: Awake/alert Behavior During Therapy: WFL for tasks assessed/performed Overall Cognitive Status: Impaired/Different from baseline Area of Impairment: Memory;Problem solving  Memory: Decreased short-term memory       Problem Solving: Requires verbal cues General Comments: Pt unable to recall the three ST memory words presented after 3 minutes. Pt also with decreased ST memory during path finding task; pt able to recall the key points in directions provided but put them in the  wrong order. Pt then requiring Mod cues for problem solving and reoganizing the directions.    General Comments  VSS    Exercises     Shoulder Instructions      Home Living Family/patient expects to be discharged to:: Private residence Living Arrangements: Spouse/significant other;Children Available Help at Discharge: Family;Available 24 hours/day Type of Home: House Home Access: Level entry     Home Layout: Two level;Bed/bath upstairs Alternate Level Stairs-Number of Steps: flight Alternate Level Stairs-Rails: Left Bathroom Shower/Tub: Teacher, early years/pre: Standard     Home Equipment: Tub bench;Grab bars - tub/shower;Walker - 2 wheels;Cane - single point          Prior Functioning/Environment Level of Independence: Independent        Comments: Works from home as a Engineer, manufacturing. Just recently stop using a cane for mobility.         OT Problem List: Decreased activity tolerance;Decreased strength;Decreased cognition      OT Treatment/Interventions:      OT Goals(Current goals can be found in the care plan section) Acute Rehab OT Goals Patient Stated Goal: "Go home soon" OT Goal Formulation: All assessment and education complete, DC therapy  OT Frequency:     Barriers to D/C:            Co-evaluation              AM-PAC OT "6 Clicks" Daily Activity     Outcome Measure Help from another person eating meals?: None Help from another person taking care of personal grooming?: None Help from another person toileting, which includes using toliet, bedpan, or urinal?: A Little Help from another person bathing (including washing, rinsing, drying)?: A Little Help from another person to put on and taking off regular upper body clothing?: None Help from another person to put on and taking off regular lower body clothing?: A Little 6 Click Score: 21   End of Session Equipment Utilized During Treatment: Gait belt Nurse Communication: Mobility  status  Activity Tolerance: Patient tolerated treatment well Patient left: in chair;with call bell/phone within reach  OT Visit Diagnosis: Other abnormalities of gait and mobility (R26.89);Muscle weakness (generalized) (M62.81);Other symptoms and signs involving cognitive function                Time: 1424-1444 OT Time Calculation (min): 20 min Charges:  OT General Charges $OT Visit: 1 Visit OT Evaluation $OT Eval Moderate Complexity: [place], OTR/L Acute Rehab Pager: [phone] Office: [place] 12/29/2018, 4:44 PM

## 2018-12-29 NOTE — Progress Notes (Signed)
STROKE TEAM PROGRESS NOTE       INTERVAL HISTORY Her wife is at bedside over the speaker phone.  Patient states she has no complaints this morning.  She has had at least 4 episodes of vision disturbance, wavy lines and left-sided paresthesias which have been short lasting.  MRI scan is negative for stroke and EEG shows no seizure activity.  Patient does have a prior history of migraine headaches and did have similar vision disturbances with those.  She does admit to mild headaches after these episodes.  She does admit to being under significant stress from new job which she has recently started.   OBJECTIVE Vitals:   12/29/18 0330 12/29/18 0800 12/29/18 0836 12/29/18 1227  BP: 111/61  137/85 132/85  Pulse: 73 78 86 77  Resp: 20 17 18 19   Temp: 98.8 F (37.1 C)  98.7 F (37.1 C) 99.1 F (37.3 C)  TempSrc: Oral  Oral Oral  SpO2: 100% 100% 100% 100%  Weight:      Height:        CBC:  Recent Labs  Lab 12/27/18 1249  12/29/18 0419 12/29/18 0654  WBC 11.6*  --  9.7  --   NEUTROABS 6.7  --   --   --   HGB 7.4*   < > 6.7* 6.7*  HCT 28.3*   < > 25.0* 24.9*  MCV 65.2*  --  64.1*  --   PLT 1,181*  --  848*  --    < > = values in this interval not displayed.    Basic Metabolic Panel:  Recent Labs  Lab 12/27/18 1249 12/27/18 1257 12/29/18 0419  NA 136 139 136  K 3.5 3.5 3.7  CL 105 104 108  CO2 18*  --  20*  GLUCOSE 103* 101* 97  BUN 5* 3* <5*  CREATININE 0.81 0.70 0.81  CALCIUM 9.3  --  8.4*    Lipid Panel:     Component Value Date/Time   CHOL 76 12/28/2018 1401   TRIG 81 12/28/2018 1401   HDL 28 (L) 12/28/2018 1401   CHOLHDL 2.7 12/28/2018 1401   VLDL 16 12/28/2018 1401   LDLCALC 32 12/28/2018 1401   HgbA1c:  Lab Results  Component Value Date   HGBA1C 5.6 12/28/2018   Urine Drug Screen:     Component Value Date/Time   LABOPIA NONE DETECTED 11/11/2017 0235   COCAINSCRNUR NONE DETECTED 11/11/2017 0235   LABBENZ NONE DETECTED 11/11/2017 0235   AMPHETMU  NONE DETECTED 11/11/2017 0235   THCU NONE DETECTED 11/11/2017 0235   LABBARB NONE DETECTED 11/11/2017 0235    Alcohol Level     Component Value Date/Time   ETH <10 11/10/2017 2126    IMAGING  Ct Angio Head W Or Wo Contrast Ct Angio Neck W Or Wo Contrast Ct Cerebral Perfusion W Contrast 12/27/2018 IMPRESSION:  CTA neck:  1. 2 mm hypodense focus within the posterior aspect of the right carotid bulb suspicious for adherent thrombus. There may be an underlying carotid web at this site.  2. Common and internal carotid arteries widely patent within the neck without significant stenosis.  3. Bilateral vertebral arteries patent within the neck without significant stenosis.  CTA head:  No intracranial large vessel occlusion or proximal high-grade arterial stenosis.  CT perfusion head:  No core infarct is identified. No critically hypoperfused parenchyma is identified utilizing a Tmax>6 seconds threshold. No mismatch reported.   Ct Head Wo Contrast 12/27/2018 IMPRESSION:  1. Stable CT  appearance of the brain from earlier today. No acute intracranial hemorrhage or evolving infarct identified following IV tPA.  2. Subtle right hemisphere encephalomalacia as the sequelae of the scattered small 2019 infarcts.   Mr Angio Head Wo Contrast 12/27/2018 IMPRESSION:  Normal intracranial MRA.   Mr Brain Wo Contrast 12/27/2018 IMPRESSION:  1. No acute infarct identified.  2. No acute intracranial hemorrhage or other acute intracranial abnormality.  3. Small areas of encephalomalacia and hemosiderin related to multifocal right MCA territory infarcts which occurred in 2019.   Ct Head Code Stroke Wo Contrast 12/27/2018 IMPRESSION:  A few subtle sites of cortical hypodensity within the right MCA territory appear to correspond with previous infarcts. No definite acute infarct identified. ASPECTS 10.    Transthoracic Echocardiogram  12/28/2018 IMPRESSIONS  1. Left ventricular ejection fraction, by  visual estimation, is 60 to 65%. The left ventricle has normal function. There is no left ventricular hypertrophy.  2. Global right ventricle has normal systolic function.The right ventricular size is normal. No increase in right ventricular wall thickness.  3. Left atrial size was normal.  4. Right atrial size was normal.  5. The mitral valve is normal in structure. Trace mitral valve regurgitation. No evidence of mitral stenosis.  6. The tricuspid valve is normal in structure. Tricuspid valve regurgitation is trivial.  7. The aortic valve is normal in structure. Aortic valve regurgitation is not visualized. No evidence of aortic valve sclerosis or stenosis.  8. The pulmonic valve was normal in structure. Pulmonic valve regurgitation is not visualized.  9. Mildly elevated pulmonary artery systolic pressure. 10. The inferior vena cava is normal in size with greater than 50% respiratory variability, suggesting right atrial pressure of 3 mmHg.   ECG - SR rate 82 BPM. (See cardiology reading for complete details)   EEG -normal  PHYSICAL EXAM Blood pressure 132/85, pulse 77, temperature 99.1 F (37.3 C), temperature source Oral, resp. rate 19, height 5\' 2"  (1.575 m), weight 92.9 kg, SpO2 100 %. Obese middle-aged African-American lady not in distress. . Afebrile. Head is nontraumatic. Neck is supple without bruit.    Cardiac exam no murmur or gallop. Lungs are clear to auscultation. Distal pulses are well felt. Neurological Exam: Neurological Exam ;  Awake  Alert oriented x 3. Normal speech and language.eye movements full without nystagmus.fundi were not visualized. Vision acuity and fields appear normal. Hearing is normal. Palatal movements are normal. Face symmetric. Tongue midline. Normal strength, tone, reflexes and coordination. Normal sensation. Gait deferred.   ASSESSMENT/PLAN Ms. NARAYAH PULEO is a 43 y.o. 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  She received IV tPA Saturday 12/27/2018 @ 1330.  Suspected stroke treated with tPA. MRI negative. Episodes of left-sided weakness and sensory changes may be complicated migraine episodes CTA shows possible residual vs new thrombus or carotid web.   Code Stroke CT Head - A few subtle sites of cortical hypodensity within the right MCA territory appear to correspond with previous infarcts. No definite acute infarct identified. ASPECTS 10.   CT head - Stable CT appearance of the brain from earlier today. No acute intracranial hemorrhage or evolving infarct identified following IV tPA.   MRI head  - no acute findings.  MRA head - normal  CTA H&N - 2 mm hypodense focus within  the posterior aspect of the right carotid bulb suspicious for adherent thrombus. There may be an underlying carotid web at this site.   CT Perfusion - No core infarct is identified. No critically hypoperfused parenchyma is identified utilizing a Tmax>6 seconds threshold. No mismatch reported.   Carotid Doppler - CTA neck performed - carotid dopplers not indicated.  2D Echo - EF 60 - 65%. No cardiac source of emboli identified.   Sars Corona Virus 2 - negative  LDL - 32  HgbA1c - 5.6  UDS - not ordered  VTE prophylaxis - SCDs Diet  Diet Order            Diet Heart Room service appropriate? Yes; Fluid consistency: Thin  Diet effective now              aspirin 81 mg daily prior to admission, now on No antithrombotic s/p tPA, will restart ASA after CT head results.  Patient counseled to be compliant with her antithrombotic medications  Ongoing aggressive stroke risk factor management  Therapy recommendations:  pending  Disposition:  Pending  Hypertension  Home  BP meds: none   Current BP meds: none   Stable . Permissive hypertension (OK if < 220/120) but gradually normalize in 5-7 days  . Long-term BP goal normotensive  Hyperlipidemia  Home Lipid lowering medication: none  LDL 32, goal < 70  Current lipid lowering medication: none  Continue statin at discharge   Other Stroke Risk Factors  ETOH use, advised to drink no more than 1 alcoholic beverage per day.  Obesity, Body mass index is 37.46 kg/m., recommend weight loss, diet and exercise as appropriate   Hx stroke/TIA  Family hx stroke (father and aunt)   Other Active Problems  Anemia - Hb - 7.4->10.6.7-chronic iron deficiency anemia which was present during previous admission in October 2019 as well.  She has not been taking her scheduled iron infusion as she does not like side effects  Mild Leukocytosis - 11.6 (afebrile)  Thrombocytosis - 1,181  PLAN Recommend trial of Topamax 50 mg twice daily to help with migraine prophylaxis.  Discussed possible side effects with patient and her wife over the phone and answered questions.  Check carotid ultrasound to look for carotid web/clot described on CT angiogram.  Mobilize out of bed.  Therapy consults. A total of 25 minutes was spent for the care of this patient, spent on counseling patient and family on different diagnostic and therapeutic options, counseling and coordination of care, riskd ans benefits of management, compliance, or risk factor reduction and education. Antony Contras, , MD Stroke Neurology      To contact Stroke Continuity provider, please refer to [email or web address] After hours, contact General Neurology

## 2018-12-29 NOTE — Evaluation (Signed)
Speech Language Pathology Evaluation Patient Details Name: Mallory Robinson MRN: [id number] DOB: 04/09/1975 Today's Date: 12/29/2018 Time: [id number] SLP Time Calculation (min) (ACUTE ONLY): 17 min  Problem List:  Patient Active Problem List   Diagnosis Date Noted  . Acute ischemic stroke ([place]) 12/27/2018  . Cerebral embolism with cerebral infarction 11/11/2017  . Acute CVA (cerebrovascular accident) ([place]) 11/11/2017  . Neurological deficit present 11/10/2017  . Bipolar I disorder, most recent episode (or current) depressed, severe, without mention of psychotic behavior 03/05/2013  . PTSD (post-traumatic stress disorder) 03/05/2013  . Alcohol dependence ([place]) 03/04/2013   Past Medical History:  Past Medical History:  Diagnosis Date  . Bipolar 1 disorder ([place])   . Complication of anesthesia   . Depression   . Hypertension   . PONV (postoperative nausea and vomiting)   . Stroke [facility])    Past Surgical History:  Past Surgical History:  Procedure Laterality Date  . CESAREAN SECTION     X2  . HERNIA REPAIR    . TEE WITHOUT CARDIOVERSION N/A 11/13/2017   Procedure: TRANSESOPHAGEAL ECHOCARDIOGRAM (TEE) bubble study;  Surgeon: Skeet Latch, MD;  Location: [place];  Service: Cardiovascular;  Laterality: N/A;   HPI:  Mallory Robinson is a 43 y.o. 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 tPA given.  CT 11/8 and MRI 11/7 with no acute findings.  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 EEG normal.   Assessment / Plan / Recommendation Clinical Impression  Pt presents with mild memory impairments.  Pt's cognitive linguistic function was assessed using the COGNISTAT  (see below for additional information).  Pt reports that she feels she has been slowly returning to baseline since this event, but remains concerned about her ability to return to full function at her job, and is concerned about facing setbacks after her recovery from stroke in 2019.  Pt would like to work with speech therapy to address memory impairments.  SLP to follow for memory compensatory strategies and cognitive therapy.  Pt would benefit from continued ST at next level of care.   COGNISTAT - all subtests are within the average range, except where otherwise specified Orientation: 12/12 Attention: 7/8 Comprehension: 6/6 Repetition: 12/12 Naming: 8/8 Construction: Not assessed Memory: 8/12, Mild Impairment Calculations: 3/4 Similarities: 6/8 Judgment: 6/6    SLP Assessment  SLP Recommendation/Assessment: Patient needs continued Speech Lanaguage Pathology Services SLP Visit Diagnosis: Cognitive communication deficit (R41.841)    Follow Up Recommendations  Outpatient SLP    Frequency and Duration min 2x/week  2 weeks      SLP Evaluation Cognition  Overall Cognitive Status: Impaired/Different from baseline Arousal/Alertness: Awake/alert Orientation Level: Oriented X4 Attention: Focused;Sustained Focused Attention: Appears intact Sustained Attention: Appears intact Memory: Impaired Memory Impairment: Decreased short term memory Decreased Short Term Memory: Verbal basic Awareness: Appears intact Problem Solving: Appears intact Executive Function: Reasoning Reasoning: Appears intact       Comprehension  Auditory Comprehension Overall Auditory Comprehension: Appears within functional limits for tasks assessed Commands: Within Functional Limits Conversation: Simple    Expression Expression Primary Mode of Expression: Verbal Verbal Expression Overall Verbal Expression: Appears within functional limits for tasks assessed Initiation: No impairment Repetition: No  impairment Naming: No impairment Pragmatics: No impairment  Oral / Surveyor, quantity Overall Motor Speech: Appears within functional limits for tasks assessed Respiration: Within functional limits Resonance: Within functional limits Articulation: Within functional limitis Intelligibility: Intelligible Motor Planning: Witnin functional limits Motor Speech Errors: Not applicable   [place], [place], [place] Office: [phone] 12/29/2018, 11:20 AM

## 2018-12-29 NOTE — Plan of Care (Signed)
Patient stable, discussed POC with patient, agreeable with plan, denies question/concerns at this time.  

## 2018-12-30 DIAGNOSIS — G43009 Migraine without aura, not intractable, without status migrainosus: Secondary | ICD-10-CM

## 2018-12-30 DIAGNOSIS — R2990 Unspecified symptoms and signs involving the nervous system: Secondary | ICD-10-CM

## 2018-12-30 MED ORDER — TOPIRAMATE 50 MG PO TABS: 50.0000 mg | ORAL_TABLET | Freq: Two times a day (BID) | ORAL | 0 refills | Status: DC

## 2018-12-30 MED ORDER — ASPIRIN 325 MG PO TABS: 325.0000 mg | ORAL_TABLET | Freq: Every day | ORAL | 1 refills | Status: DC

## 2018-12-30 NOTE — Progress Notes (Signed)
Patient being discharged home, wife providing transportation. Educated patient on discharge instructions, questions answered. Belongings returned to patient: clothes, phone charger, and phone.  Mallory Robinson A Mallory Robinson

## 2018-12-30 NOTE — Discharge Summary (Addendum)
Stroke Discharge Summary  Patient ID: Mallory Robinson   MRN: [id number]      DOB: 1975-04-18  Date of Admission: 12/27/2018 Date of Discharge: 12/30/2018  Attending Physician:  Garvin Fila, MD, Stroke MD Consultant(s):    None  Patient's PCP:  Nolene Ebbs, MD  DISCHARGE DIAGNOSIS:  Active Problems:   Stroke like episode s/p iv TPA treatment Atypical migraine  Past Medical History:  Diagnosis Date  . Bipolar 1 disorder ([place])   . Complication of anesthesia   . Depression   . Hypertension   . PONV (postoperative nausea and vomiting)   . Stroke [facility])    Past Surgical History:  Procedure Laterality Date  . CESAREAN SECTION     X2  . HERNIA REPAIR    . TEE WITHOUT CARDIOVERSION N/A 11/13/2017   Procedure: TRANSESOPHAGEAL ECHOCARDIOGRAM (TEE) bubble study;  Surgeon: Skeet Latch, MD;  Location: [place];  Service: Cardiovascular;  Laterality: N/A;    Allergies as of 12/30/2018      Reactions   Food Itching, Swelling   Reaction to Melons (lips and eyes swelled)   Penicillins Hives, Itching, Other (See Comments)   Hair fell out Did it involve swelling of the face/tongue/throat, SOB, or low BP? No Did it involve sudden or severe rash/hives, skin peeling, or any reaction on the inside of your mouth or nose? Yes Did you need to seek medical attention at a hospital or doctor's office? In hospital reaction When did it last happen?43 yrs old If all above answers are "NO", may proceed with cephalosporin use.      Medication List    STOP taking these medications   ALPRAZolam 0.5 MG tablet Commonly known as: XANAX   megestrol 40 MG tablet Commonly known as: MEGACE   pyridOXINE 50 MG tablet Commonly known as: B-6     TAKE these medications   aspirin 325 MG tablet Take 1 tablet (325 mg total) by mouth daily. What changed:   medication strength  how much to take   ferrous sulfate 325 (65 FE) MG tablet Take 1 tablet (325 mg total) by mouth 2 (two)  times daily with a meal.   folic acid 1 MG tablet Commonly known as: FOLVITE Take 2 tablets (2 mg total) by mouth daily.   topiramate 50 MG tablet Commonly known as: TOPAMAX Take 1 tablet (50 mg total) by mouth 2 (two) times daily.       LABORATORY STUDIES CBC    Component Value Date/Time   WBC 9.7 12/29/2018 0419   RBC 3.90 12/29/2018 0419   HGB 6.7 (LL) 12/29/2018 0654   HGB 13.2 12/18/2017 0936   HCT 24.9 (L) 12/29/2018 0654   HCT 43.3 12/18/2017 0936   PLT 848 (H) 12/29/2018 0419   PLT 446 12/18/2017 0936   MCV 64.1 (L) 12/29/2018 0419   MCV 81 12/18/2017 0936   MCH 17.2 (L) 12/29/2018 0419   MCHC 26.8 (L) 12/29/2018 0419   RDW 30.1 (H) 12/29/2018 0419   LYMPHSABS 4.2 (H) 12/27/2018 1249   LYMPHSABS 1.8 12/18/2017 0936   MONOABS 0.6 12/27/2018 1249   EOSABS 0.0 12/27/2018 1249   EOSABS 0.1 12/18/2017 0936   BASOSABS 0.1 12/27/2018 1249   BASOSABS 0.1 12/18/2017 0936   CMP    Component Value Date/Time   NA 136 12/29/2018 0419   K 3.7 12/29/2018 0419   CL 108 12/29/2018 0419   CO2 20 (L) 12/29/2018 0419   GLUCOSE 97 12/29/2018  0419   BUN <5 (L) 12/29/2018 0419   CREATININE 0.81 12/29/2018 0419   CALCIUM 8.4 (L) 12/29/2018 0419   PROT 7.5 12/27/2018 1249   ALBUMIN 4.0 12/27/2018 1249   AST 73 (H) 12/27/2018 1249   ALT 49 (H) 12/27/2018 1249   ALKPHOS 78 12/27/2018 1249   BILITOT 0.5 12/27/2018 1249   GFRNONAA >60 12/29/2018 0419   GFRAA >60 12/29/2018 0419   COAGS Lab Results  Component Value Date   INR 1.1 12/27/2018   INR 1.2 12/18/2017   INR 1.31 11/14/2017   Lipid Panel    Component Value Date/Time   CHOL 76 12/28/2018 1401   TRIG 81 12/28/2018 1401   HDL 28 (L) 12/28/2018 1401   CHOLHDL 2.7 12/28/2018 1401   VLDL 16 12/28/2018 1401   LDLCALC 32 12/28/2018 1401   HgbA1C  Lab Results  Component Value Date   HGBA1C 5.6 12/28/2018   Urinalysis    Component Value Date/Time   COLORURINE YELLOW 11/11/2017 0235   APPEARANCEUR CLEAR  11/11/2017 0235   LABSPEC 1.038 (H) 11/11/2017 0235   PHURINE 8.0 11/11/2017 0235   GLUCOSEU NEGATIVE 11/11/2017 0235   HGBUR LARGE (A) 11/11/2017 0235   BILIRUBINUR NEGATIVE 11/11/2017 0235   KETONESUR NEGATIVE 11/11/2017 0235   PROTEINUR NEGATIVE 11/11/2017 0235   UROBILINOGEN 0.2 07/28/2012 1838   NITRITE NEGATIVE 11/11/2017 0235   LEUKOCYTESUR NEGATIVE 11/11/2017 0235   Urine Drug Screen     Component Value Date/Time   LABOPIA NONE DETECTED 11/11/2017 0235   COCAINSCRNUR NONE DETECTED 11/11/2017 0235   LABBENZ NONE DETECTED 11/11/2017 0235   AMPHETMU NONE DETECTED 11/11/2017 0235   THCU NONE DETECTED 11/11/2017 0235   LABBARB NONE DETECTED 11/11/2017 0235    Alcohol Level    Component Value Date/Time   ETH <10 11/10/2017 2126     SIGNIFICANT DIAGNOSTIC STUDIES  Code Stroke CT Head - A few subtle sites of cortical hypodensity within the right MCA territory appear to correspond with previous infarcts. No definite acute infarct identified. ASPECTS 10.   CT head - Stable CT appearance of the brain from earlier today. No acute intracranial hemorrhage or evolving infarct identified following IV tPA.   MRI head  - no acute findings.  MRA head - normal  CTA H&N - 2 mm hypodense focus within the posterior aspect of the right carotid bulb suspicious for adherent thrombus. There may be an underlying carotid web at this site.   CT Perfusion - No core infarct is identified. No critically hypoperfused parenchyma is identified utilizing a Tmax>6 seconds threshold. No mismatch reported.   Carotid Doppler - CTA neck performed - carotid dopplers not indicated.  2D Echo - EF 60 - 65%. 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 She was walking her dogs have been suddenly her left leg went out and she also had weakness of the left arm. EMS was called because she has had a stroke that affected the left side of her body in 2019 and she recognized the symptoms. Of note patient, patient has prior history of migraine headaches with similar visual disturbances.  She does admit  to mild headaches after these episodes.  She does admit to being under significant stress from new job which she has recently started.  HOSPITAL COURSE Patient presented to [facility] ED as Code stroke.  She received IV TPA for suspected stroke.  Episodes of left-sided weakness and sensory changes may be complicated migraine episodes CTA shows possible residual vs new thrombus or carotid web. Stroke workup included above exams.  MRI negative for stroke.  Topamax 50 mg BID was started and appears to be helping.     DISCHARGE EXAM Blood pressure (!) 145/83, pulse 77, temperature 98.8 F (37.1 C), temperature source Oral, resp. rate 18, height 5\' 2"  (1.575 m), weight 92.9 kg, SpO2 100 %.   Discharge Diet       Diet   Diet Heart Room service appropriate? Yes; Fluid consistency: Thin   liquids  DISCHARGE PLAN  Disposition:  Home   aspirin 325 mg daily for secondary stroke prevention  Topamax 50 mg twice daily for migraine prevention  Ongoing stroke risk factor control by Primary Care Physician at time of discharge  Follow-up Nolene Ebbs, MD in 2 weeks.  Follow-up in [facility] in 4 weeks with Franky Macho, NP, office to schedule an appointment.   40 minutes were spent preparing discharge.  Beulah Gandy, NP I have personally obtained history,examined this patient, reviewed notes, independently viewed imaging  studies, participated in medical decision making and plan of care.ROS completed by me personally and pertinent positives fully documented  I have made any additions or clarifications directly to the above note. Agree with note above.    Antony Contras, MD Medical Director [facility] Pager: [phone] 12/30/2018 11:55 AM

## 2018-12-30 NOTE — TOC Transition Note (Signed)
Transition of Care [facility]) - CM/SW Discharge Note   Patient Details  Name: Mallory Robinson MRN: [id number] Date of Birth: 1975-12-22  Transition of Care [facility]) CM/SW Contact:  Pollie Friar, RN Phone Number: 12/30/2018, 11:16 AM   Clinical Narrative:    Patient is discharging home with outpatient rehab. CM met with the patient and she decided on [facility]. Orders in Epic and information on the AVS. Pt states she has supervision at home. She denies any issues with home medications or transportation.  Pt has transportation home today.   Final next level of care: OP Rehab Barriers to Discharge: No Barriers Identified   Patient Goals and CMS Choice     Choice offered to / list presented to : Patient  Discharge Placement                       Discharge Plan and Services                                     Social Determinants of Health (SDOH) Interventions     Readmission Risk Interventions No flowsheet data found.

## 2019-01-14 ENCOUNTER — Encounter: Payer: Self-pay | Admitting: Physical Therapy

## 2019-01-14 ENCOUNTER — Other Ambulatory Visit: Payer: Self-pay

## 2019-01-14 ENCOUNTER — Telehealth: Payer: Self-pay | Admitting: Physical Therapy

## 2019-01-14 ENCOUNTER — Ambulatory Visit: Payer: Medicaid Other

## 2019-01-14 ENCOUNTER — Ambulatory Visit: Payer: Medicaid Other | Attending: Internal Medicine | Admitting: Physical Therapy

## 2019-01-14 VITALS — BP 130/85

## 2019-01-14 DIAGNOSIS — R41841 Cognitive communication deficit: Secondary | ICD-10-CM | POA: Diagnosis present

## 2019-01-14 DIAGNOSIS — M6281 Muscle weakness (generalized): Secondary | ICD-10-CM | POA: Insufficient documentation

## 2019-01-14 DIAGNOSIS — R4701 Aphasia: Secondary | ICD-10-CM | POA: Insufficient documentation

## 2019-01-14 DIAGNOSIS — R2681 Unsteadiness on feet: Secondary | ICD-10-CM | POA: Insufficient documentation

## 2019-01-14 DIAGNOSIS — G8194 Hemiplegia, unspecified affecting left nondominant side: Secondary | ICD-10-CM | POA: Insufficient documentation

## 2019-01-14 DIAGNOSIS — R209 Unspecified disturbances of skin sensation: Secondary | ICD-10-CM | POA: Diagnosis present

## 2019-01-14 DIAGNOSIS — R2689 Other abnormalities of gait and mobility: Secondary | ICD-10-CM | POA: Diagnosis present

## 2019-01-14 NOTE — Telephone Encounter (Signed)
Hello Dr. Leonie Man,  Mallory Robinson was evaluated by physical therapy and speech therapy today.  We feel Mallory Robinson would also benefit from Occupational therapy due to ongoing LUE weakness and impaired coordination that is limiting her ability to work right now.  If you agree, please submit an order for OT Eval and Treat in EPIC or fax to [facility] at [phone].   Thank you, Rico Junker, PT, DPT 01/14/19    8:29 AM   [facility] [address] [place], [place]  [address] Phone:  [phone] Fax:  [phone]

## 2019-01-14 NOTE — Therapy (Signed)
[facility] [address] [place], [place], [address] Phone: [phone]   Fax:  [phone]  Physical Therapy Evaluation  Patient Details  Name: Mallory Robinson MRN: [id number] Date of Birth: 1975-10-28 Referring Provider (PT): Frann Rider, NP - referred by Dr. Leonie Man when D/C from hospital   Encounter Date: 01/14/2019  PT End of Session - 01/14/19 1157    Visit Number  1    Number of Visits  17    Date for PT Re-Evaluation  03/15/19    Authorization Type  Billing evaluation to Medicaid (changing insurance to [place] in 4 days, billing remainder visits under [place])    PT Start Time  0810    PT Stop Time  0856    PT Time Calculation (min)  46 min    Equipment Utilized During Treatment  Gait belt    Activity Tolerance  Patient tolerated treatment well    Behavior During Therapy  [facility] for tasks assessed/performed;Anxious   pt reported nervousness during DGI assessment      Past Medical History:  Diagnosis Date  . Bipolar 1 disorder ([place])   . Complication of anesthesia   . Depression   . Hypertension   . PONV (postoperative nausea and vomiting)   . Stroke [facility])     Past Surgical History:  Procedure Laterality Date  . CESAREAN SECTION     X2  . HERNIA REPAIR    . TEE WITHOUT CARDIOVERSION N/A 11/13/2017   Procedure: TRANSESOPHAGEAL ECHOCARDIOGRAM (TEE) bubble study;  Surgeon: Skeet Latch, MD;  Location: [place];  Service: Cardiovascular;  Laterality: N/A;    Vitals:   01/14/19 0833  BP: 130/85     Subjective Assessment - 01/14/19 0814    Subjective  Pt has a hx of R cerebral stroke on 11/11/17. On 12/27/18 she experienced sudden onset of L LE weakness and it sounded like she 'had marbles in her mouth' when speaking. Visually, it seemed like her environment was 'squiggly' and she slowly felt herself descend to the floor as her L UE and LE went numb. Her son went over symptoms of stroke with her and  decided to call 911. Pt went to the ED and received TPA. MRI was clear. She states that when this happened it feels like she has regressed from the progress she recently gained after the stroke in 2019. Pt states that L hand feels 'weighted' and she's fearful that she'll drop something if she holds it in that hand.    Pertinent History  Bipolar1, depression, HTN,  and CVA with left sided weakness.    Limitations  Walking;House hold activities;Other (comment)   stairs, pt states significant fatigue   Patient Stated Goals  walk without cane, being confident walking in the community, walk up steps safely to get into new townhouse    Currently in Pain?  Yes    Pain Score  0-No pain   only in AM   Pain Location  Hip   pt related ot over compensating when walking   Pain Orientation  Right    Pain Descriptors / Indicators  Aching;Sore    Pain Radiating Towards  wraps around from posterior R hip to anterior    Pain Onset  1 to 4 weeks ago    Pain Frequency  Intermittent    Aggravating Factors   not moving over night    Pain Relieving Factors  getting up and moving in the morning  [facility] PT Assessment - 01/14/19 0825      Assessment   Medical Diagnosis  L sided weakness    Referring Provider (PT)  Frann Rider, NP - referred by Dr. Leonie Man when D/C from hospital    Onset Date/Surgical Date  12/27/18    Hand Dominance  Right    Next MD Visit  f/u 02/04/19    Prior Therapy  HH in 2019 post CVA       Precautions   Precautions  Fall      Restrictions   Weight Bearing Restrictions  No      Balance Screen   Has the patient fallen in the past 6 months  Yes   controlled descent   How many times?  1    Has the patient had a decrease in activity level because of a fear of falling?   Yes   d/t family being afraid   Is the patient reluctant to leave their home because of a fear of falling?   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[employer], on Teaching laboratory technician all day long    Leisure  walking/running, anything independelntly,       Cognition   Overall Cognitive Status  Impaired/Different from baseline   pt states having issues with memory and concentration     Observation/Other Assessments   Focus on Therapeutic Outcomes (FOTO)   Not assessed      Sensation   Light Touch  Impaired by gross assessment   L sided diminished & tingling     ROM / Strength   AROM / PROM / Strength  Strength      Strength   Overall Strength  Deficits    Overall Strength Comments  RLE WNL; LLE globally 3-/5 requiring self talk to initiate movement, delayed initiation      Ambulation/Gait   Ambulation/Gait  Yes    Ambulation/Gait Assistance  4: Min guard   CGA for safety   Ambulation/Gait Assistance Details  enters/exit clinic with SPC, no cane used during assessment    Ambulation Distance (Feet)  --   in gym during DGI assessment   Assistive device  None;Straight cane    Gait Pattern  Antalgic;Decreased arm swing - right;Decreased arm swing - left;Decreased step length - right;Decreased stance time - left;Decreased stride length;Decreased hip/knee flexion - right;Decreased hip/knee flexion - left;Decreased dorsiflexion - left;Decreased weight shift to left;Wide base of support    Stairs  Yes    Stairs Assistance  4: Min guard   SPT provided CGA for safety   Stair Management  Technique  Two rails;Step to pattern    Number of Stairs  4    Height  of Stairs  6      Standardized Balance Assessment   Standardized Balance Assessment  Dynamic Gait Index;10 meter walk test    10 Meter Walk  2.15 ft/sec      Dynamic Gait Index   Level Surface  Mild Impairment    Change in Gait Speed  Moderate Impairment    Gait with Horizontal Head Turns  Moderate Impairment    Gait with Vertical Head Turns  Moderate Impairment    Gait and Pivot Turn  Mild Impairment    Step Over Obstacle  Moderate Impairment    Step Around Obstacles  Mild Impairment    Steps  Moderate Impairment    Total Score  11    DGI comment:  11/24, significant fall risk                Objective measurements completed on examination: See above findings.              PT Education - 01/14/19 1538    Education Details  Clinical findings, PT POC and goals    Person(s) Educated  Patient    Methods  Explanation    Comprehension  Verbalized understanding       PT Short Term Goals - 01/14/19 1135      PT SHORT TERM GOAL #1   Title  Patient demonstrates independence with initial HEP for improved balance, strength, and functional mobility (All STG target date: 02/13/2019)    Baseline  01/14/19: will start HEP next session    Time  4    Period  Weeks    Status  New    Target Date  02/13/19      PT SHORT TERM GOAL #2   Title  Patient will demonstrate increase in global LE strength 3+/5 for improved functional strength    Baseline  01/14/19: LE globally 3-/5 with incoordination and delayed initiation    Time  4    Period  Weeks    Status  New    Target Date  02/13/19      PT SHORT TERM GOAL #3   Title  Patient will improve DGI by at least 3 points in order to demosntrate clinically significant improevment in balance and decreased risk for falls.    Baseline  01/14/19: 11/24    Time  4    Period  Weeks    Status  New    Target Date  02/13/19      PT SHORT TERM GOAL #4   Title   Patient will ambulate >/= 2.62 ft/sec with LRAD to indicate ability to safely ambulate in the community    Baseline  --    Time  4    Period  Weeks    Status  New    Target Date  02/13/19        PT Long Term Goals - 01/14/19 1143      PT LONG TERM GOAL #1   Title  Patient will demonstrate independence with advanced HEP for improved balance, strength and functional gait. (All LTG target date: 03/15/2019)    Baseline  01/14/19: Will be given at a later time in [place]    Time  8    Period  Weeks    Status  New    Target Date  03/15/19      PT LONG TERM GOAL #2   Title  Patient will demonstrate improvement in DGI >/= 19/24 to indicate reduced risk of falls  Baseline  01/14/19: 11/24    Time  8    Period  Weeks    Status  New    Target Date  03/15/19      PT LONG TERM GOAL #3   Title  Patient will ambulate >/= 3.0 ft/sec with LRAD to indicate ability to safely ambulate in the community    Baseline  01/14/19: 2.15 ft./sec    Time  8    Period  Weeks    Status  New    Target Date  03/15/19      PT LONG TERM GOAL #4   Title  Patient will demonstrate ability to ambulate 12 stairs with alternating pattern, using only 1 rail for safety and independence in townhouse    Baseline  01/14/19: step to, requires both railings    Time  8    Period  Weeks    Status  New    Target Date  03/15/19      PT LONG TERM GOAL #5   Title  Pt will ambulate >1000' outside over uneven pavement, grass, gravel and mulch safely MOD I without AD for functional independence    Baseline  01/14/19: uses [facility] for community ambulation    Time  8    Period  Weeks    Status  New    Target Date  03/15/19             Plan - 01/14/19 1132    Clinical Impression Statement  Patient is a 43 yo female s/p stroke like symptoms with sudden onset weakness on L side on 12/27/18 s/p TPA. MRI brain was negative for acute event. Patient's PMH is significant for: bipolar 1, depression, HTN, and CVA with left sided  weakness. PT examination revealed deficits in strength, balance, and gait disturbances as noted by global LE MMT 3-/5 with delayed initiation and a DGI score of 11/24 indicating significant fall risk. She presented with antalgic gait and had visual gaze downward towards feet. Pt was uncomfortable with visual scanning during gait assessment and responded with nervous behavior and reports of anxiousness. Pt will benefit from skilled therapy services to address deficits and progress towards functional goals.    Personal Factors and Comorbidities  Comorbidity 3+;Past/Current Experience    Comorbidities  Bipolar1, depression, HTN,  and CVA with left sided weakness.    Examination-Activity Limitations  Stairs;Locomotion Level    Examination-Participation Restrictions  Community Activity;Other   work activities   Stability/Clinical Decision Making  Evolving/Moderate complexity    Clinical Decision Making  Moderate    Rehab Potential  Good    PT Frequency  2x / week    PT Duration  8 weeks    PT Treatment/Interventions  Aquatic Therapy;Balance training;Therapeutic exercise;Therapeutic activities;Stair training;Neuromuscular re-education;Gait training;Patient/family education;Manual techniques;ADLs/Self Care Home Management;Electrical Stimulation    PT Next Visit Plan  initiate standing balance and strengthening program, start HEP, weight shifting onto LLE/LUE, core stabilization,    Recommended Other Services  discussed OT    Consulted and Agree with Plan of Care  Patient       Patient will benefit from skilled therapeutic intervention in order to improve the following deficits and impairments:  Abnormal gait, Decreased coordination, Difficulty walking, Decreased activity tolerance, Decreased balance, Decreased strength, Impaired sensation  Visit Diagnosis: Muscle weakness (generalized)  Other abnormalities of gait and mobility  Unsteadiness on feet  Unspecified disturbances of skin  sensation  Hemiplegia affecting left nondominant side, unspecified etiology, unspecified hemiplegia type ([place])  Problem List Patient Active Problem List   Diagnosis Date Noted  . Acute ischemic stroke ([place]) 12/27/2018  . Cerebral embolism with cerebral infarction 11/11/2017  . Acute CVA (cerebrovascular accident) ([place]) 11/11/2017  . Neurological deficit present 11/10/2017  . Bipolar I disorder, most recent episode (or current) depressed, severe, without mention of psychotic behavior 03/05/2013  . PTSD (post-traumatic stress disorder) 03/05/2013  . Alcohol dependence ([place]) 03/04/2013    Juliann Pulse SPT  Juliann Pulse 01/14/2019, 3:40 PM  [facility] [address] [place], [place], [address] Phone: [phone]   Fax:  [phone]  Name: Mallory Robinson MRN: [id number] Date of Birth: May 16, 1975

## 2019-01-14 NOTE — Therapy (Signed)
[facility] [address] [place], [place], [address] Phone: [phone]   Fax:  [phone]  Speech Language Pathology Evaluation  Patient Details  Name: Mallory Robinson MRN: [id number] Date of Birth: 1975/06/09 Referring Provider (SLP): Antony Contras, MD   Encounter Date: 01/14/2019  End of Session - 01/14/19 1553    Visit Number  1    Number of Visits  17    Date for SLP Re-Evaluation  04/14/19    SLP Start Time  0851    SLP Stop Time   0931    SLP Time Calculation (min)  40 min    Activity Tolerance  Patient tolerated treatment well       Past Medical History:  Diagnosis Date  . Bipolar 1 disorder ([place])   . Complication of anesthesia   . Depression   . Hypertension   . PONV (postoperative nausea and vomiting)   . Stroke [facility])     Past Surgical History:  Procedure Laterality Date  . CESAREAN SECTION     X2  . HERNIA REPAIR    . TEE WITHOUT CARDIOVERSION N/A 11/13/2017   Procedure: TRANSESOPHAGEAL ECHOCARDIOGRAM (TEE) bubble study;  Surgeon: Skeet Latch, MD;  Location: [facility];  Service: Cardiovascular;  Laterality: N/A;    There were no vitals filed for this visit.  Subjective Assessment - 01/14/19 0923    Subjective  "I have tell you I was focused on that darn ball bouncing in the hall."         SLP Evaluation [facility] - 01/14/19 1545      SLP Visit Information   SLP Received On  01/14/19    Referring Provider (SLP)  Antony Contras, MD    Onset Date  2019    Medical Diagnosis  CVA      Subjective   Patient/Family Stated Goal  "Improve my memory"      Pain Assessment   Currently in Pain?  No/denies      General Information   HPI  Mallory Robinson is a 43 y.o. 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 tPA given.  CT 11/8 and MRI 11/7 with no acute findings.  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 Pt reports Md also talked of seizure disorder when pt was in hospital two weeks ago. EEG was WNL, and MR, CT were unremarkable (showed pt's previous CVA) during pt's most recent hospitalization.      Prior Functional Status   Cognitive/Linguistic Baseline  Within functional limits    Type of Home  House     Lives With  Significant other    Vocation  Full time employment      Cognition   Overall Cognitive Status  Impaired/Different from baseline    Area of Impairment  Attention;Memory    Attention Comments  Pt was distracted by ball bouncing with PT pt in gym during [employer] assessment.  She has to turn around her monitor in order to pay attention to viritual meetings for work.    Memory Comments  SLP believes a fraction of pt's memory deficit is due to pt's decr'd attention.       Auditory Comprehension   Overall Auditory Comprehension  Appears within functional limits for tasks assessed      Verbal Expression   Overall Verbal Expression  Appears within functional limits for tasks assessed      Written Expression   Dominant Hand  Right    Written Expression  Unable to assess (comment)   due to time; difficulty "seeing a word" occasionally     Oral Motor/Sensory Function   Overall Oral Motor/Sensory Function  Appears within functional limits for tasks assessed      Motor Speech   Overall Motor Speech  Appears within functional limits for tasks assessed      Standardized Assessments   Standardized Assessments   Other Assessment   hopkins verbal learning test   Other Assessment  RECALL: 7/12 (WNL), 4/12 (below WNL), 8/12 (WNL); RECOGNITION: 9 (> 2 standard deviations BELOW the mean)                       SLP Education - 01/14/19 1553    Education Details  reiterate to cont to write things down for recall and also to put into long term memory    Person(s) Educated  Patient    Methods  Explanation    Comprehension  Verbalized understanding       SLP Short Term Goals - 01/14/19 1559      SLP SHORT TERM GOAL #1   Title  pt will demo selective attention in a mod distracting environment for 5 minutes    Baseline  30 seconds    Time  4    Period  Weeks    Status  New      SLP SHORT TERM GOAL #2   Title  pt will *develop* functional compensation/s for reduced memory skills in all situations    Baseline  for work, but not at home    Time  4    Period  Weeks    Status  New      SLP [place] #3   Title  pt will undergo language testing PRN    Time  2    Period  Weeks    Status  New       SLP Long Term Goals - 01/14/19 1601      SLP LONG TERM GOAL #1   Title  pt will tell SLP how she is using at least 3 aspects of a memory system between 5 sessions    Baseline  uses one aspect    Time  8    Period  Weeks   or 17 sessions, for all LTGS   Status  New      SLP LONG TERM GOAL #2   Title  pt will report using copmensations for divided attention    Baseline  compensations used intermittently in selective attention    Time  8    Period  Weeks    Status  New      SLP LONG TERM GOAL #3   Title  pt will demo functional divided attention using compensations PRN for 10 minutes in 3 sessions    Baseline  selective attention    Time  8    Period  Weeks    Status  New       Plan - 01/14/19 1554    Clinical Impression Statement  Pt presents with cognitive communication deficits [id number]) after a CVA in 2019. Pt noticed worsening of these symptoms after a seizure on 12-27-18 (pr d/c'd from hospital  12-30-18), and reports she now has to modify her workspace to turn around her monitor in order to attend to work meetings (virtual). Today during  eval pt was distracted by ball bouncing in the hallway and could not recall >4/12 words (trial before and after pt thought of 7/12 and 8/12 items, respectively). She also reports possible alexia/written aphasia - tesing may be necessary in this area and goals added as needed. Pt would benefit from skilled ST targeting attention rehab and memory compensations.    Speech Therapy Frequency  2x / week    Duration  --   8 weeks or 17 sessions   Treatment/Interventions  Functional tasks;Compensatory techniques;SLP instruction and feedback;Cueing hierarchy;Cognitive reorganization;Internal/external aids;Patient/family education    Consulted and Agree with Plan of Care  Patient       Patient will benefit from skilled therapeutic intervention in order to improve the following deficits and impairments:   Cognitive communication deficit - Plan: SLP plan of care cert/re-cert  Aphasia - Plan: SLP plan of care cert/re-cert    Problem List Patient Active Problem List   Diagnosis Date Noted  . Acute ischemic stroke ([place]) 12/27/2018  . Cerebral embolism with cerebral infarction 11/11/2017  . Acute CVA (cerebrovascular accident) ([place]) 11/11/2017  . Neurological deficit present 11/10/2017  . Bipolar I disorder, most recent episode (or current) depressed, severe, without mention of psychotic behavior 03/05/2013  . PTSD (post-traumatic stress disorder) 03/05/2013  . Alcohol dependence ([place]) 03/04/2013    [facility] ,[place], [place]  01/14/2019, 4:09 PM  [facility] [address] [place], [place], [address] Phone: [phone]   Fax:  [phone]  Name: Mallory Robinson MRN: [id number] Date of Birth: [birth year]

## 2019-01-14 NOTE — Patient Instructions (Signed)
We will work on your attention and your memory  I recommend continuing to write things down for work and otherwise, that is  agreat way to compensate for both attention and memory.

## 2019-01-15 ENCOUNTER — Other Ambulatory Visit (HOSPITAL_COMMUNITY): Payer: Self-pay | Admitting: Neurology

## 2019-01-15 DIAGNOSIS — M6281 Muscle weakness (generalized): Secondary | ICD-10-CM

## 2019-01-15 NOTE — Telephone Encounter (Signed)
Ok agree will order

## 2019-01-20 ENCOUNTER — Ambulatory Visit: Payer: Medicaid Other | Attending: Internal Medicine | Admitting: Physical Therapy

## 2019-01-20 DIAGNOSIS — G8194 Hemiplegia, unspecified affecting left nondominant side: Secondary | ICD-10-CM | POA: Insufficient documentation

## 2019-01-20 DIAGNOSIS — R2689 Other abnormalities of gait and mobility: Secondary | ICD-10-CM | POA: Insufficient documentation

## 2019-01-20 DIAGNOSIS — R209 Unspecified disturbances of skin sensation: Secondary | ICD-10-CM | POA: Insufficient documentation

## 2019-01-20 DIAGNOSIS — R2681 Unsteadiness on feet: Secondary | ICD-10-CM | POA: Insufficient documentation

## 2019-01-20 DIAGNOSIS — M6281 Muscle weakness (generalized): Secondary | ICD-10-CM | POA: Insufficient documentation

## 2019-01-23 ENCOUNTER — Other Ambulatory Visit: Payer: Self-pay

## 2019-01-23 ENCOUNTER — Ambulatory Visit: Payer: Medicaid Other | Admitting: Physical Therapy

## 2019-01-23 ENCOUNTER — Encounter: Payer: Self-pay | Admitting: Physical Therapy

## 2019-01-23 DIAGNOSIS — R2689 Other abnormalities of gait and mobility: Secondary | ICD-10-CM

## 2019-01-23 DIAGNOSIS — R209 Unspecified disturbances of skin sensation: Secondary | ICD-10-CM | POA: Diagnosis present

## 2019-01-23 DIAGNOSIS — M6281 Muscle weakness (generalized): Secondary | ICD-10-CM | POA: Diagnosis present

## 2019-01-23 DIAGNOSIS — R2681 Unsteadiness on feet: Secondary | ICD-10-CM | POA: Diagnosis present

## 2019-01-23 DIAGNOSIS — G8194 Hemiplegia, unspecified affecting left nondominant side: Secondary | ICD-10-CM | POA: Diagnosis present

## 2019-01-23 NOTE — Patient Instructions (Signed)
Access Code: [email or web address]  URL: [email or web address]  Date: 01/23/2019  Prepared by: Willow Ora   Exercises Hooklying Single Knee to Chest Stretch - 3 reps - 1 sets - 30 hold - 1x daily - 5x weekly Supine Double Knee to Chest - 3 reps - 1 sets - 20 hold - 1x daily - 5x weekly Seated Hamstring Stretch - 3 reps - 1 sets - 30 hold - 1x daily - 5x weekly Sit to/from Stand in Stride position - 10 reps - 1 sets - 1x daily - 5x weekly Standing Single Leg Stance with Counter Support - 3 reps - 1 sets  - 10 hold - 1x daily - 5x weekly Single Leg Heel Raise with Counter Support - 10 reps - 1 sets - 1x daily - 5x weekly Standing Near Stance in Corner with Eyes Closed - 3 reps - 1 sets - 30 hold - 1x daily - 5x weekly Standing Balance in Corner with Eyes Closed - 10 reps - 1 sets - 1x daily - 5x weekly

## 2019-01-23 NOTE — Therapy (Signed)
[facility] [address] [place], [place], [address] Phone: [phone]   Fax:  [phone]  Physical Therapy Treatment  Patient Details  Name: Mallory Robinson MRN: [id number] Date of Birth: [birth year] Referring Provider (PT): Frann Rider, NP - referred by Dr. Leonie Man when D/C from hospital   Encounter Date: 01/23/2019  PT End of Session - 01/23/19 0722    Visit Number  2    Number of Visits  17    Date for PT Re-Evaluation  03/15/19    Authorization Type  [employer]    PT Start Time  0717    PT Stop Time  0759    PT Time Calculation (min)  42 min    Equipment Utilized During Treatment  Gait belt    Activity Tolerance  Patient tolerated treatment well    Behavior During Therapy  [facility] for tasks assessed/performed;Anxious       Past Medical History:  Diagnosis Date  . Bipolar 1 disorder ([place])   . Complication of anesthesia   . Depression   . Hypertension   . PONV (postoperative nausea and vomiting)   . Stroke [facility])     Past Surgical History:  Procedure Laterality Date  . CESAREAN SECTION     X2  . HERNIA REPAIR    . TEE WITHOUT CARDIOVERSION N/A 11/13/2017   Procedure: TRANSESOPHAGEAL ECHOCARDIOGRAM (TEE) bubble study;  Surgeon: Skeet Latch, MD;  Location: [facility];  Service: Cardiovascular;  Laterality: N/A;    There were no vitals filed for this visit.  Subjective Assessment - 01/23/19 0719    Subjective  Having increased lower back pain into the hip joints. Only change is going up/down steps at new house. Wakes her up at night. 7/10 currently, 10/10 in middle of night last night. No falls.    Pertinent History  Bipolar1, depression, HTN,  and CVA with left sided weakness.    Limitations  Walking;House hold activities;Other (comment)    Patient Stated Goals  walk without cane, being confident walking in the community, walk up steps safely to get into new townhouse    Currently in Pain?  Yes    Pain  Score  7     Pain Location  Back    Pain Orientation  Right;Left;Lower    Pain Descriptors / Indicators  Aching;Sore    Pain Type  Acute pain    Pain Radiating Towards  across back into bil hips    Pain Onset  1 to 4 weeks ago    Pain Frequency  Constant    Aggravating Factors   stairs, immobility, worst at night    Pain Relieving Factors  getting up and moving, tylenol           [facility] Adult PT Treatment/Exercise - 01/23/19 0723      Self-Care   Self-Care  Other Self-Care Comments    Other Self-Care Comments   sleeping positions for decreased low back pain, use of pillows      Neuro Re-ed    Neuro Re-ed Details   issued balance ex's for HEP today. Refer to [place] program for full details.       Exercises   Exercises  Other Exercises    Other Exercises   supine on mat table: posterior pelvic tilts 5 sec holds for 5 reps, single knee to chest stretch for 30 sec's for 3 reps each side; double knee to chest stretch for 30 sec's x 3 reps; seated at edge  of mat hamstring stretching for 30 sec's x 3 reps each side.        Issued the following to HEP. Cues on form and technique needed. Min guard assist for balance. No issues reported with performance in session today.   Access Code: [email or web address]  URL: [email or web address]  Date: 01/23/2019  Prepared by: Willow Ora   Exercises Hooklying Single Knee to Chest Stretch - 3 reps - 1 sets - 30 hold - 1x daily - 5x weekly Supine Double Knee to Chest - 3 reps - 1 sets - 20 hold - 1x daily - 5x weekly Seated Hamstring Stretch - 3 reps - 1 sets - 30 hold - 1x daily - 5x weekly Sit to/from Stand in Stride position - 10 reps - 1 sets - 1x daily - 5x weekly Standing Single Leg Stance with Counter Support - 3 reps - 1 sets  - 10 hold - 1x daily - 5x weekly Single Leg Heel Raise with Counter Support - 10 reps - 1 sets - 1x daily - 5x weekly Standing Near Stance in Corner with Eyes Closed - 3 reps - 1 sets - 30 hold - 1x daily - 5x  weekly Standing Balance in Corner with Eyes Closed - 10 reps - 1 sets - 1x daily - 5x weekly       PT Short Term Goals - 01/14/19 1135      PT SHORT TERM GOAL #1   Title  Patient demonstrates independence with initial HEP for improved balance, strength, and functional mobility (All STG target date: 02/13/2019)    Baseline  01/14/19: will start HEP next session    Time  4    Period  Weeks    Status  New    Target Date  02/13/19      PT SHORT TERM GOAL #2   Title  Patient will demonstrate increase in global LE strength 3+/5 for improved functional strength    Baseline  01/14/19: LE globally 3-/5 with incoordination and delayed initiation    Time  4    Period  Weeks    Status  New    Target Date  02/13/19      PT SHORT TERM GOAL #3   Title  Patient will improve DGI by at least 3 points in order to demosntrate clinically significant improevment in balance and decreased risk for falls.    Baseline  01/14/19: 11/24    Time  4    Period  Weeks    Status  New    Target Date  02/13/19      PT SHORT TERM GOAL #4   Title  Patient will ambulate >/= 2.62 ft/sec with LRAD to indicate ability to safely ambulate in the community    Baseline  --    Time  4    Period  Weeks    Status  New    Target Date  02/13/19        PT Long Term Goals - 01/14/19 1143      PT LONG TERM GOAL #1   Title  Patient will demonstrate independence with advanced HEP for improved balance, strength and functional gait. (All LTG target date: 03/15/2019)    Baseline  01/14/19: Will be given at a later time in POC    Time  8    Period  Weeks    Status  New    Target Date  03/15/19      PT LONG TERM GOAL #2  Title  Patient will demonstrate improvement in DGI >/= 19/24 to indicate reduced risk of falls    Baseline  01/14/19: 11/24    Time  8    Period  Weeks    Status  New    Target Date  03/15/19      PT LONG TERM GOAL #3   Title  Patient will ambulate >/= 3.0 ft/sec with LRAD to indicate ability to  safely ambulate in the community    Baseline  01/14/19: 2.15 ft./sec    Time  8    Period  Weeks    Status  New    Target Date  03/15/19      PT LONG TERM GOAL #4   Title  Patient will demonstrate ability to ambulate 12 stairs with alternating pattern, using only 1 rail for safety and independence in townhouse    Baseline  01/14/19: step to, requires both railings    Time  8    Period  Weeks    Status  New    Target Date  03/15/19      PT LONG TERM GOAL #5   Title  Pt will ambulate >1000' outside over uneven pavement, grass, gravel and mulch safely MOD I without AD for functional independence    Baseline  01/14/19: uses [facility] for community ambulation    Time  8    Period  Weeks    Status  New    Target Date  03/15/19            Plan - 01/23/19 [id number]    Clinical Impression Statement  Today's skilled session initially used stretching/exercises to address pt's pain with pt reporting decreased pain to 2/10. Also discussed use of pillows with different sleeping positions to decrease pain. Remainder of session addressed adding balance ex's to HEP in addition to stretches/strengthening ex's already added. No issues reported with ex's performed in session today. The pt is progressing toward goals and should benefit from continued PT to progress toward unmet goals.    Personal Factors and Comorbidities  Comorbidity 3+;Past/Current Experience    Comorbidities  Bipolar1, depression, HTN,  and CVA with left sided weakness.    Examination-Activity Limitations  Stairs;Locomotion Level    Examination-Participation Restrictions  Community Activity;Other   work activities   [employer]  Evolving/Moderate complexity    Rehab Potential  Good    PT Frequency  2x / week    PT Duration  8 weeks    PT Treatment/Interventions  Aquatic Therapy;Balance training;Therapeutic exercise;Therapeutic activities;Stair training;Neuromuscular re-education;Gait training;Patient/family  education;Manual techniques;ADLs/Self Care Home Management;Electrical Stimulation    PT Next Visit Plan  continue to work on LE strengthening, standing balance with emphasis on weight shifting onto left LE, core strengthening/stabilization, dynamic balance/gait    Consulted and Agree with Plan of Care  Patient       Patient will benefit from skilled therapeutic intervention in order to improve the following deficits and impairments:  Abnormal gait, Decreased coordination, Difficulty walking, Decreased activity tolerance, Decreased balance, Decreased strength, Impaired sensation  Visit Diagnosis: Other abnormalities of gait and mobility  Muscle weakness (generalized)  Unsteadiness on feet  Unspecified disturbances of skin sensation  Hemiplegia affecting left nondominant side, unspecified etiology, unspecified hemiplegia type [facility])     Problem List Patient Active Problem List   Diagnosis Date Noted  . Acute ischemic stroke ([place]) 12/27/2018  . Cerebral embolism with cerebral infarction 11/11/2017  . Acute CVA (cerebrovascular accident) ([place]) 11/11/2017  . Neurological  deficit present 11/10/2017  . Bipolar I disorder, most recent episode (or current) depressed, severe, without mention of psychotic behavior 03/05/2013  . PTSD (post-traumatic stress disorder) 03/05/2013  . Alcohol dependence ([place]) 03/04/2013    Willow Ora, PTA, [facility] [address], [place],  [address] [phone] 01/23/19, 8:06 PM   Name: Mallory Robinson MRN: [id number] Date of Birth: 11-19-1975

## 2019-01-24 ENCOUNTER — Other Ambulatory Visit: Payer: Self-pay

## 2019-01-24 ENCOUNTER — Ambulatory Visit
Admission: EM | Admit: 2019-01-24 | Discharge: 2019-01-24 | Disposition: A | Discharge status: Home / Self Care | Payer: 59 | Attending: Emergency Medicine | Admitting: Emergency Medicine

## 2019-01-24 ENCOUNTER — Encounter: Payer: Self-pay | Admitting: Emergency Medicine

## 2019-01-24 DIAGNOSIS — R519 Headache, unspecified: Secondary | ICD-10-CM

## 2019-01-24 DIAGNOSIS — R059 Cough, unspecified: Secondary | ICD-10-CM

## 2019-01-24 DIAGNOSIS — Z20828 Contact with and (suspected) exposure to other viral communicable diseases: Secondary | ICD-10-CM

## 2019-01-24 DIAGNOSIS — R05 Cough: Secondary | ICD-10-CM

## 2019-01-24 MED ORDER — FLUTICASONE PROPIONATE 50 MCG/ACT NA SUSP: 1.0000 | Freq: Every day | NASAL | 0 refills | Status: DC

## 2019-01-24 MED ORDER — LORATADINE 10 MG PO TABS: 10.0000 mg | ORAL_TABLET | Freq: Every day | ORAL | 0 refills | Status: DC

## 2019-01-24 MED ORDER — BENZONATATE 100 MG PO CAPS: 100.0000 mg | ORAL_CAPSULE | Freq: Three times a day (TID) | ORAL | 0 refills | Status: DC

## 2019-01-24 NOTE — ED Provider Notes (Addendum)
[facility]    CSN: [id number] Arrival date & time: 01/24/19  1125      History   Chief Complaint Chief Complaint  Patient presents with  . COVID Exposure  . Cough    HPI HILEY HILLSTROM is a 43 y.o. female  Presenting for Covid testing: Exposure: Wife Date of exposure: Chronic, last close exposure 1 week ago Any fever, symptoms since exposure: Yes: Dry, nonproductive cough, myalgias, generalized headache x3 days.  Has not take anything for symptoms.  No hemoptysis, fever, shortness of breath.  Past Medical History:  Diagnosis Date  . Bipolar 1 disorder ([place])   . Complication of anesthesia   . Depression   . Hypertension   . PONV (postoperative nausea and vomiting)   . Stroke [facility])     Patient Active Problem List   Diagnosis Date Noted  . Acute ischemic stroke ([place]) 12/27/2018  . Cerebral embolism with cerebral infarction 11/11/2017  . Acute CVA (cerebrovascular accident) ([place]) 11/11/2017  . Neurological deficit present 11/10/2017  . Bipolar I disorder, most recent episode (or current) depressed, severe, without mention of psychotic behavior 03/05/2013  . PTSD (post-traumatic stress disorder) 03/05/2013  . Alcohol dependence ([place]) 03/04/2013    Past Surgical History:  Procedure Laterality Date  . CESAREAN SECTION     X2  . HERNIA REPAIR    . TEE WITHOUT CARDIOVERSION N/A 11/13/2017   Procedure: TRANSESOPHAGEAL ECHOCARDIOGRAM (TEE) bubble study;  Surgeon: Skeet Latch, MD;  Location: [facility];  Service: Cardiovascular;  Laterality: N/A;    OB History   No obstetric history on file.      Home Medications    Prior to Admission medications   Medication Sig Start Date End Date Taking? Authorizing Provider  aspirin 325 MG tablet Take 1 tablet (325 mg total) by mouth daily. 12/30/18   August Albino, NP  benzonatate (TESSALON) 100 MG capsule Take 1 capsule (100 mg total) by mouth every 8 (eight) hours. 01/24/19   Hall-Potvin, [place],  PA-C  ferrous sulfate 325 (65 FE) MG tablet Take 1 tablet (325 mg total) by mouth 2 (two) times daily with a meal. Patient not taking: Reported on 12/27/2018 11/13/17   Jonetta Osgood, MD  fluticasone (FLONASE) 50 MCG/ACT nasal spray Place 1 spray into both nostrils daily. 01/24/19   Hall-Potvin, [place], PA-C  folic acid (FOLVITE) 1 MG tablet Take 2 tablets (2 mg total) by mouth daily. Patient not taking: Reported on 12/27/2018 11/14/17   Jonetta Osgood, MD  loratadine (CLARITIN) 10 MG tablet Take 1 tablet (10 mg total) by mouth daily. 01/24/19   Hall-Potvin, [place], PA-C  topiramate (TOPAMAX) 50 MG tablet Take 1 tablet (50 mg total) by mouth 2 (two) times daily. 12/30/18   August Albino, NP    Family History Family History  Problem Relation Age of Onset  . Hypertension Mother   . Stroke Father   . Stroke Maternal Aunt     Social History Social History   Tobacco Use  . Smoking status: Never Smoker  . Smokeless tobacco: Never Used  Substance Use Topics  . Alcohol use: Yes    Comment: Occasional  . Drug use: No     Allergies   Food and Penicillins   Review of Systems Review of Systems  Constitutional: Positive for fatigue. Negative for activity change, appetite change and fever.  HENT: Negative for congestion, dental problem, ear pain, facial swelling, hearing loss, sinus pain, sore throat, trouble swallowing and voice  change.   Eyes: Negative for photophobia, pain and visual disturbance.  Respiratory: Positive for cough. Negative for shortness of breath, wheezing and stridor.   Cardiovascular: Negative for chest pain and palpitations.  Gastrointestinal: Negative for diarrhea and vomiting.  Genitourinary: Negative for decreased urine volume, frequency, hematuria and urgency.  Musculoskeletal: Positive for myalgias. Negative for arthralgias, back pain, neck pain and neck stiffness.  Neurological: Positive for headaches. Negative for dizziness and light-headedness.      Physical Exam Triage Vital Signs ED Triage Vitals  Enc Vitals Group     BP 01/24/19 1141 (!) 139/93     Pulse Rate 01/24/19 1141 100     Resp 01/24/19 1141 18     Temp 01/24/19 1141 97.8 F (36.6 C)     Temp Source 01/24/19 1141 Temporal     SpO2 01/24/19 1141 98 %     Weight --      Height --      Head Circumference --      Peak Flow --      Pain Score 01/24/19 1142 4     Pain Loc --      Pain Edu? --      Excl. in [place]? --    No data found.  Updated Vital Signs BP (!) 139/93 (BP Location: Left Arm)   Pulse 100   Temp 97.8 F (36.6 C) (Temporal)   Resp 18   SpO2 98%   Visual Acuity Right Eye Distance:   Left Eye Distance:   Bilateral Distance:    Right Eye Near:   Left Eye Near:    Bilateral Near:     Physical Exam Constitutional:      General: She is not in acute distress.    Appearance: She is obese. She is not toxic-appearing.  HENT:     Head: Normocephalic and atraumatic.     Mouth/Throat:     Mouth: Mucous membranes are moist.     Pharynx: Oropharynx is clear. No oropharyngeal exudate or posterior oropharyngeal erythema.  Eyes:     General: No scleral icterus.    Conjunctiva/sclera: Conjunctivae normal.     Pupils: Pupils are equal, round, and reactive to light.  Neck:     Musculoskeletal: Normal range of motion and neck supple. No muscular tenderness.  Cardiovascular:     Rate and Rhythm: Normal rate and regular rhythm.  Pulmonary:     Effort: Pulmonary effort is normal. No respiratory distress.     Breath sounds: No wheezing, rhonchi or rales.     Comments: Good air entry bilaterally without prolonged expiratory phase Musculoskeletal:     Right lower leg: No edema.     Left lower leg: No edema.  Lymphadenopathy:     Cervical: No cervical adenopathy.  Skin:    Capillary Refill: Capillary refill takes less than 2 seconds.     Coloration: Skin is not jaundiced or pale.     Findings: No rash.  Neurological:     General: No focal deficit  present.     Mental Status: She is alert and oriented to person, place, and time.      UC Treatments / Results  Labs (all labs ordered are listed, but only abnormal results are displayed) Labs Reviewed  NOVEL CORONAVIRUS, NAA    EKG   Radiology No results found.  Procedures Procedures (including critical care time)  Medications Ordered in UC Medications - No data to display  Initial Impression / Assessment and Plan /  UC Course  I have reviewed the triage vital signs and the nursing notes.  Pertinent labs & imaging results that were available during my care of the patient were reviewed by me and considered in my medical decision making (see chart for details).     Patient afebrile, nontoxic in office.  Covid PCR pending: Patient to quarantine until results are back.  Will try benzonatate, antihistamine, intranasal steroid spray for symptom relief.  Return precautions discussed, patient verbalized understanding and is agreeable to plan. Final Clinical Impressions(s) / UC Diagnoses   Final diagnoses:  Cough     Discharge Instructions     Your COVID test is pending - it is important to quarantine / isolate at home until your results are back. If you test positive and would like further evaluation for persistent or worsening symptoms, you may schedule an E-visit or virtual (video) visit throughout the [facility] app or website.  PLEASE NOTE: If you develop severe chest pain or shortness of breath please go to the ER or call [phone] for further evaluation --> DO NOT schedule electronic or virtual visits for this. Please call our office for further guidance / recommendations as needed.    ED Prescriptions    Medication Sig Dispense Auth. Provider   benzonatate (TESSALON) 100 MG capsule Take 1 capsule (100 mg total) by mouth every 8 (eight) hours. 21 capsule Hall-Potvin, [place], PA-C   fluticasone (FLONASE) 50 MCG/ACT nasal spray Place 1 spray into both nostrils  daily. 16 g Hall-Potvin, [place], PA-C   loratadine (CLARITIN) 10 MG tablet Take 1 tablet (10 mg total) by mouth daily. 30 tablet Hall-Potvin, [place], PA-C     PDMP not reviewed this encounter.   Hall-Potvin, [place], PA-C 01/24/19 1216    Hall-Potvin, [place], [place] 01/24/19 1217

## 2019-01-24 NOTE — ED Triage Notes (Addendum)
Pt presents to [facility] for assessment after a known positive COVID exposure 1 week ago, with dry cough, body aches, and headache and fatigue.

## 2019-01-24 NOTE — Discharge Instructions (Signed)
Your COVID test is pending - it is important to quarantine / isolate at home until your results are back. °If you test positive and would like further evaluation for persistent or worsening symptoms, you may schedule an E-visit or virtual (video) visit throughout the [facility] MyChart app or website. ° °PLEASE NOTE: If you develop severe chest pain or shortness of breath please go to the ER or call [phone] for further evaluation --> DO NOT schedule electronic or virtual visits for this. °Please call our office for further guidance / recommendations as needed. °

## 2019-01-26 ENCOUNTER — Encounter (HOSPITAL_COMMUNITY): Payer: Self-pay

## 2019-01-26 LAB — NOVEL CORONAVIRUS, NAA: SARS-CoV-2, NAA: DETECTED — AB

## 2019-01-27 ENCOUNTER — Ambulatory Visit: Payer: Medicaid Other | Admitting: Speech Pathology

## 2019-01-27 ENCOUNTER — Ambulatory Visit: Payer: Medicaid Other | Admitting: Physical Therapy

## 2019-01-29 ENCOUNTER — Telehealth: Payer: Self-pay | Admitting: Nurse Practitioner

## 2019-01-29 ENCOUNTER — Ambulatory Visit: Payer: Medicaid Other | Admitting: Speech Pathology

## 2019-01-29 ENCOUNTER — Ambulatory Visit: Payer: Medicaid Other | Admitting: Physical Therapy

## 2019-01-29 NOTE — Telephone Encounter (Signed)
Called to Discuss with patient about Covid symptoms and the use of bamlanivimab, a monoclonal antibody infusion for those with mild to moderate Covid symptoms and at a high risk of hospitalization.     Pt is qualified for this infusion at the [facility] infusion center due to co-morbid conditions and/or a member of an at-risk group.    Patient is managed for the following: Patient Active Problem List   Diagnosis Date Noted  . Acute ischemic stroke ([place]) 12/27/2018  . Cerebral embolism with cerebral infarction 11/11/2017  . Acute CVA (cerebrovascular accident) ([place]) 11/11/2017  . Neurological deficit present 11/10/2017  . Bipolar I disorder, most recent episode (or current) depressed, severe, without mention of psychotic behavior 03/05/2013  . PTSD (post-traumatic stress disorder) 03/05/2013  . Alcohol dependence ([place]) 03/04/2013    Unable to reach patient.

## 2019-02-03 ENCOUNTER — Ambulatory Visit: Payer: Medicaid Other | Admitting: Physical Therapy

## 2019-02-03 ENCOUNTER — Encounter: Payer: Medicaid Other | Admitting: Speech Pathology

## 2019-02-04 ENCOUNTER — Inpatient Hospital Stay: Payer: Medicaid Other | Admitting: Adult Health

## 2019-02-05 ENCOUNTER — Ambulatory Visit: Payer: Medicaid Other | Admitting: Physical Therapy

## 2019-02-09 NOTE — Progress Notes (Signed)
[name] [address] [place]. [place] [address] [phone]       [place]  [name] Date of Birth:  Aug 07, 1975 Medical Record Number:  [id number]   Reason for Referral:  hospital stroke follow up    CHIEF COMPLAINT:  Chief Complaint  Patient presents with  . Hospitalization Follow-up    Wife present. Treatment room. She would like to discuss what should be normal. She stated her thoughts are all over the place.     HPI: Mallory Robinson being Robinson today for in office hospital follow-up regarding suspected stroke treated with TPA on 12/27/2018 versus possible complicated migraines.  History obtained from patient, wife and chart review. Reviewed all radiology images and labs personally.  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  She received IV tPA for suspected stroke.  Felt as though left-sided weakness and sensory changes may be complicated migraine episodes especially with mild headaches after episodes with history of migraines and similar visual disturbances.  Also per note, admitted to being under significant stress with recently starting Robinson new job.  CT head and MRI brain negative for acute stroke.  MRA head unremarkable.  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  CT perfusion unremarkable.  2D echo normal EF without cardiac source of embolus identified.  EEG negative for seizures.  Initiated Topamax 50 mg twice daily  with improvement headaches.  Recommended restarting aspirin 81 mg daily for secondary stroke prevention.  HTN stable.  LDL 32.  Other stroke risk factors include EtOH use, obesity, history of stroke/TIA and family history of stroke.  Recommended outpatient therapy and discharged in stable condition.  Mallory Robinson today, 02/10/2019, for hospital follow-up accompanied by her wife.  Prior stroke deficits of left hemiparesis and balance impairment greatly improved previously with minimal to no residual deficits but reports worsening left hemiparesis, balance and dizziness after recent admission along with left-sided hypersensitivity.  She also complains of short-term memory loss feeling "scatterbrained" and has difficulty focusing.  She is able to maintain doing ADLs and majority of IADLs independently.  She was working with outpatient PT/ST after discharge but currently on hold as she was diagnosed with COVID-19 on 01/26/2019 and was quarantined until recently.  She denies any residual symptoms from COVID-19 but does report increased fatigue and slightly worsening left-sided weakness.  She has continued on Topamax 50 mg twice daily but continues to experience visual aura migraines on Robinson daily basis consisting of flashing lights in right periphery followed by Robinson headache associated with nausea/vomiting, photophobia and phonophobia.  Migraine will subside after resting in Robinson dark quiet area.  She has not experienced any reoccurring worsening left leg weakness accompanied with migraine.  She has not used any ibuprofen or acetaminophen.  She does endorse increased anxiety and stress due to medical condition with underlying history of bipolar disorder.  She does not currently follow with psychiatry.  She has continued on aspirin 325 mg daily without bleeding or bruising.  Blood pressure today 124/86.  No further concerns at  this time.    ROS:   14 system review of systems performed and negative with exception  of anxiety, depression, weakness, numbness/tingling, memory loss, headache  PMH:  Past Medical History:  Diagnosis Date  . Bipolar 1 disorder ([place])   . Complication of anesthesia   . Depression   . Hypertension   . PONV (postoperative nausea and vomiting)   . Stroke [facility])     PSH:  Past Surgical History:  Procedure Laterality Date  . CESAREAN SECTION     X2  . HERNIA REPAIR    . TEE WITHOUT CARDIOVERSION N/Robinson 11/13/2017   Procedure: TRANSESOPHAGEAL ECHOCARDIOGRAM (TEE) bubble study;  Surgeon: Skeet Latch, MD;  Location: [facility];  Service: Cardiovascular;  Laterality: N/Robinson;    Social History:  Social History   Socioeconomic History  . Marital status: Married    Spouse name: Not on file  . Number of children: Not on file  . Years of education: Not on file  . Highest education level: Not on file  Occupational History  . Not on file  Tobacco Use  . Smoking status: Never Smoker  . Smokeless tobacco: Never Used  Substance and Sexual Activity  . Alcohol use: Yes    Comment: Occasional  . Drug use: No  . Sexual activity: Yes    Birth control/protection: None  Other Topics Concern  . Not on file  Social History Narrative  . Not on file   Social Determinants of Health   Financial Resource Strain:   . Difficulty of Paying Living Expenses: Not on file  Food Insecurity:   . Worried About Charity fundraiser in the Last Year: Not on file  . Ran Out of Food in the Last Year: Not on file  Transportation Needs:   . Lack of Transportation (Medical): Not on file  . Lack of Transportation (Non-Medical): Not on file  Physical Activity:   . Days of Exercise per Week: Not on file  . Minutes of Exercise per Session: Not on file  Stress:   . Feeling of Stress : Not on file  Social Connections:   . Frequency of Communication with Friends and Family: Not on file  . Frequency of Social Gatherings with Friends and Family: Not on file  . Attends Religious Services: Not on  file  . Active Member of Clubs or Organizations: Not on file  . Attends Archivist Meetings: Not on file  . Marital Status: Not on file  Intimate Partner Violence:   . Fear of Current or Ex-Partner: Not on file  . Emotionally Abused: Not on file  . Physically Abused: Not on file  . Sexually Abused: Not on file    Family History:  Family History  Problem Relation Age of Onset  . Hypertension Mother   . Stroke Father   . Stroke Maternal Aunt     Medications:   Current Outpatient Medications on File Prior to Visit  Medication Sig Dispense Refill  . aspirin 325 MG tablet Take 1 tablet (325 mg total) by mouth daily. 60 tablet 1  . benzonatate (TESSALON) 100 MG capsule Take 1 capsule (100 mg total) by mouth every 8 (eight) hours. 21 capsule 0  . ferrous sulfate 325 (65 FE) MG tablet Take 1 tablet (325 mg total) by mouth 2 (two) times daily with Robinson meal. 60 tablet 0  . fluticasone (FLONASE) 50 MCG/ACT nasal spray Place 1 spray into both nostrils daily. 16 g 0  . loratadine (CLARITIN)  10 MG tablet Take 1 tablet (10 mg total) by mouth daily. 30 tablet 0  . folic acid (FOLVITE) 1 MG tablet Take 2 tablets (2 mg total) by mouth daily. (Patient not taking: Reported on 12/27/2018) 30 tablet 0   No current facility-administered medications on file prior to visit.    Allergies:   Allergies  Allergen Reactions  . Food Itching and Swelling    Reaction to Melons (lips and eyes swelled)  . Penicillins Hives, Itching and Other (See Comments)    Hair fell out Did it involve swelling of the face/tongue/throat, SOB, or low BP? No Did it involve sudden or severe rash/hives, skin peeling, or any reaction on the inside of your mouth or nose? Yes Did you need to seek medical attention at Robinson hospital or doctor's office? In hospital reaction When did it last happen?43 yrs old If all above answers are "NO", may proceed with cephalosporin use.     Physical Exam  Vitals:   02/10/19 0817    BP: 124/86  Pulse: 89  Temp: 97.7 F (36.5 C)  TempSrc: Oral  Weight: 175 lb 3.2 oz (79.5 kg)  Height: 5\' 2"  (1.575 m)   Body mass index is 32.04 kg/m. No exam data present  General: well developed, well nourished,  pleasant middle-aged African-American female, seated, in no evident distress Head: head normocephalic and atraumatic.   Neck: supple with no carotid or supraclavicular bruits Cardiovascular: regular rate and rhythm, no murmurs Musculoskeletal: no deformity Skin:  no rash/petichiae Vascular:  Normal pulses all extremities   Neurologic Exam Mental Status: Awake and fully alert.   Normal speech and language.  Oriented to place and time. Recent and remote memory intact during visit. Attention span, concentration and fund of knowledge appropriate. Mood and affect appropriate.  Cranial Nerves: Fundoscopic exam reveals sharp disc margins. Pupils equal, briskly reactive to light. Extraocular movements full without nystagmus. Visual fields full to confrontation. Hearing intact. Facial sensation intact.  Mild left lower facial weakness. Motor: Normal bulk and tone. LUE: 4/5 greatest in deltoid with positive pronator drift LLE: 4/5 hip flexor, knee flexion and ankle dorsiflexion and plantarflexion Sensory.:  Hypersensitivity left upper and lower extremity with subjective increased numbness/tingling and weird sensation with light touch Coordination: Rapid alternating movements normal in all extremities except slightly diminished left hand. Finger-to-nose and heel-to-shin performed accurately on right side with mild ataxia on left side.  Mildly orbits left arm greater than. Gait and Station: Arises from chair without difficulty. Stance is normal. Gait demonstrates  mild hemiplegic gait and mild imbalance but is able to ambulate without assistive device Reflexes: 1+ and symmetric. Toes downgoing.     NIHSS 3 Modified Rankin  2    Diagnostic Data (Labs, Imaging, Testing)  Ct  Angio Head W Or Wo Contrast Ct Angio Neck W Or Wo Contrast Ct Cerebral Perfusion W Contrast 12/27/2018 IMPRESSION:  CTA neck:  1. 2 mm hypodense focus within the posterior aspect of the right carotid bulb suspicious for adherent thrombus. There may be an underlying carotid web at this site.  2. Common and internal carotid arteries widely patent within the neck without significant stenosis.  3. Bilateral vertebral arteries patent within the neck without significant stenosis.  CTA head:  No intracranial large vessel occlusion or proximal high-grade arterial stenosis.  CT perfusion head:  No core infarct is identified. No critically hypoperfused parenchyma is identified utilizing Robinson Tmax>6 seconds threshold. No mismatch reported.   Ct Head Wo Contrast 12/27/2018 IMPRESSION:  1. Stable CT appearance of the brain from earlier today. No acute intracranial hemorrhage or evolving infarct identified following IV tPA.  2. Subtle right hemisphere encephalomalacia as the sequelae of the scattered small 2019 infarcts.   Mr Angio Head Wo Contrast 12/27/2018 IMPRESSION:  Normal intracranial MRA.   Mr Brain Wo Contrast 12/27/2018 IMPRESSION:  1. No acute infarct identified.  2. No acute intracranial hemorrhage or other acute intracranial abnormality.  3. Small areas of encephalomalacia and hemosiderin related to multifocal right MCA territory infarcts which occurred in 2019.   Ct Head Code Stroke Wo Contrast 12/27/2018 IMPRESSION:  Robinson few subtle sites of cortical hypodensity within the right MCA territory appear to correspond with previous infarcts. No definite acute infarct identified. ASPECTS 10.    Transthoracic Echocardiogram  12/28/2018 IMPRESSIONS 1. Left ventricular ejection fraction, by visual estimation, is 60 to 65%. The left ventricle has normal function. There is no left ventricular hypertrophy. 2. Global right ventricle has normal systolic function.The right ventricular size is  normal. No increase in right ventricular wall thickness. 3. Left atrial size was normal. 4. Right atrial size was normal. 5. The mitral valve is normal in structure. Trace mitral valve regurgitation. No evidence of mitral stenosis. 6. The tricuspid valve is normal in structure. Tricuspid valve regurgitation is trivial. 7. The aortic valve is normal in structure. Aortic valve regurgitation is not visualized. No evidence of aortic valve sclerosis or stenosis. 8. The pulmonic valve was normal in structure. Pulmonic valve regurgitation is not visualized. 9. Mildly elevated pulmonary artery systolic pressure. 10. The inferior vena cava is normal in size with greater than 50% respiratory variability, suggesting right atrial pressure of 3 mmHg.   ECG - SR rate 82 BPM. (See cardiology reading for complete details)   EEG -normal    ASSESSMENT: Mallory Robinson is Robinson 44 y.o. year old female presented with sudden onset left-sided weakness, left-sided paresthesias and visual disturbances followed by migraine x4 episodes on 12/27/2018 with suspected stroke treated with TPA versus possible complicated migraine episodes.  MRI negative.  CTA shows possible residual versus new thrombus or carotid web.  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  Was initiated on warfarin with repeat imaging 02/11/2018 without residual dissection therefore were turned discontinued and placed on aspirin.  Vascular risk factors include HTN, HLD, prior stroke, EtOH use and migraines.  Does report worsening of prior residual deficits including left sided weakness, hypersensitivity, imbalance and memory loss.  She also reports almost daily migraines associated with visual nausea/vomiting, photophobia and phonophobia.    PLAN:  1. Strokelike episode treated with TPA vs migraine and history of prior stroke: Continue clopidogrel  75 mg daily for secondary stroke prevention.  No indication for statin as recent LDL 32.  Maintain strict control of hypertension with blood pressure goal below 130/90, diabetes with hemoglobin A1c goal below 6.5% and cholesterol with LDL cholesterol (bad cholesterol) goal below 70 mg/dL.  I also advised the patient to eat Robinson healthy diet with plenty of whole grains, cereals, fruits and vegetables, exercise regularly with at least 30 minutes of continuous activity daily and maintain ideal body weight. 2. Visual aura migraines: Placed on Topamax 50 mg twice daily after recent admission but due to concerns of memory loss, recommend discontinuing due to possible side effect.  Recommend slowly titrating to 25 mg twice daily for 2 weeks and then discontinue.  Recommend initiating propranolol 20  mg twice daily and advised to monitor blood pressure at home and to call office with any questions or concerns.  Reviewed possible side effects with printout provided.  May consider increasing dose after 1 to 2 weeks if needed.  Discussed avoidance of migraine triggers along with stress reduction techniques.  Underlying history of anxiety worsened due to current medical condition along with history of bipolar disorder.  Recommended to establish care with psychiatry which patient was in agreement to. 3. Residual deficits: Restart PT/ST as as previously on hold due to recent COVID-19 positive result and quarantine. 4. HTN: Not currently on medication management.  Recommended monitoring at home and ongoing follow-up with PCP    Follow up in 3 months or call earlier if needed   Greater than 50% of time during this 45 minute visit was spent on counseling, explanation of diagnosis of prior stroke with dissection and recent suspected stroke treated with TPA versus migraine, reviewing risk factor management of visual aura migraines and HTN, planning of further management along with potential future management, and discussion with  patient and family answering all questions.    Frann Rider, AGNP-BC  [facility] [address] [place], [place] [address]  Phone [phone] Fax [phone] Note: This document was prepared with digital dictation and possible smart phrase technology. Any transcriptional errors that result from this process are unintentional.

## 2019-02-10 ENCOUNTER — Other Ambulatory Visit: Payer: Self-pay

## 2019-02-10 ENCOUNTER — Ambulatory Visit: Payer: Medicaid Other

## 2019-02-10 ENCOUNTER — Ambulatory Visit: Payer: Medicaid Other | Admitting: Physical Therapy

## 2019-02-10 ENCOUNTER — Encounter: Payer: Self-pay | Admitting: Adult Health

## 2019-02-10 ENCOUNTER — Ambulatory Visit: Payer: Medicaid Other | Admitting: Adult Health

## 2019-02-10 VITALS — BP 124/86 | HR 89 | Temp 97.7°F | Ht 62.0 in | Wt 175.2 lb

## 2019-02-10 DIAGNOSIS — F419 Anxiety disorder, unspecified: Secondary | ICD-10-CM | POA: Diagnosis not present

## 2019-02-10 DIAGNOSIS — R413 Other amnesia: Secondary | ICD-10-CM | POA: Diagnosis not present

## 2019-02-10 DIAGNOSIS — Z8673 Personal history of transient ischemic attack (TIA), and cerebral infarction without residual deficits: Secondary | ICD-10-CM | POA: Diagnosis not present

## 2019-02-10 DIAGNOSIS — R2990 Unspecified symptoms and signs involving the nervous system: Secondary | ICD-10-CM

## 2019-02-10 DIAGNOSIS — R29818 Other symptoms and signs involving the nervous system: Secondary | ICD-10-CM

## 2019-02-10 MED ORDER — TOPIRAMATE 50 MG PO TABS: 25.0000 mg | ORAL_TABLET | Freq: Two times a day (BID) | ORAL | 0 refills | Status: DC

## 2019-02-10 MED ORDER — PROPRANOLOL HCL 20 MG PO TABS: 20.0000 mg | ORAL_TABLET | Freq: Two times a day (BID) | ORAL | 4 refills | Status: DC

## 2019-02-10 NOTE — Patient Instructions (Addendum)
Recommend initiating propranolol 20 mg twice daily for migraine treatment -please monitor your blood pressure at home and notify office with any concerns.  May increase dose after 1 to 2 weeks  Recommend slowly decreasing Topamax dosage until discontinuing.  Decrease to 25 mg twice a day for 2 weeks and then discontinue.  Continue aspirin 325 mg daily for secondary stroke prevention  Recommend establishing care with psychiatry  Continue to work with outpatient therapies for improvement  Continue to monitor blood pressure at home  Maintain strict control of hypertension with blood pressure goal below 130/90, diabetes with hemoglobin A1c goal below 6.5% and cholesterol with LDL cholesterol (bad cholesterol) goal below 70 mg/dL. I also advised the patient to eat a healthy diet with plenty of whole grains, cereals, fruits and vegetables, exercise regularly and maintain ideal body weight.  Followup in the future with me in 3 months or call earlier if needed       Thank you for coming to see [place] at [facility]. I hope we have been able to provide you high quality care today.  You may receive a patient satisfaction survey over the next few weeks. We would appreciate your feedback and comments so that we may continue to improve ourselves and the health of our patients  Propranolol tablets What is this medicine? PROPRANOLOL (proe PRAN oh lole) is a beta-blocker. Beta-blockers reduce the workload on the heart and help it to beat more regularly. This medicine is used to treat high blood pressure, to control irregular heart rhythms (arrhythmias) and to relieve chest pain caused by angina. It may also be helpful after a heart attack. This medicine is also used to prevent migraine headaches, relieve uncontrollable shaking (tremors), and help certain problems related to the thyroid gland and adrenal gland. This medicine may be used for other purposes; ask your health care provider or  pharmacist if you have questions. COMMON BRAND NAME(S): Inderal What should I tell my health care provider before I take this medicine? They need to know if you have any of these conditions:  circulation problems or blood vessel disease  diabetes  history of heart attack or heart disease, vasospastic angina  kidney disease  liver disease  lung or breathing disease, like asthma or emphysema  pheochromocytoma  slow heart rate  thyroid disease  an unusual or allergic reaction to propranolol, other beta-blockers, medicines, foods, dyes, or preservatives  pregnant or trying to get pregnant  breast-feeding How should I use this medicine? Take this medicine by mouth with a glass of water. Follow the directions on the prescription label. Take your doses at regular intervals. Do not take your medicine more often than directed. Do not stop taking except on your the advice of your doctor or health care professional. Talk to your pediatrician regarding the use of this medicine in children. Special care may be needed. Overdosage: If you think you have taken too much of this medicine contact a poison control center or emergency room at once. NOTE: This medicine is only for you. Do not share this medicine with others. What if I miss a dose? If you miss a dose, take it as soon as you can. If it is almost time for your next dose, take only that dose. Do not take double or extra doses. What may interact with this medicine? Do not take this medicine with any of the following medications:  feverfew  phenothiazines like chlorpromazine, mesoridazine, prochlorperazine, thioridazine This medicine may also interact with  the following medications:  aluminum hydroxide gel  antipyrine  antiviral medicines for HIV or AIDS  barbiturates like phenobarbital  certain medicines for blood pressure, heart disease, irregular heart  beat  cimetidine  ciprofloxacin  diazepam  fluconazole  haloperidol  isoniazid  medicines for cholesterol like cholestyramine or colestipol  medicines for mental depression  medicines for migraine headache like almotriptan, eletriptan, frovatriptan, naratriptan, rizatriptan, sumatriptan, zolmitriptan  NSAIDs, medicines for pain and inflammation, like ibuprofen or naproxen  phenytoin  rifampin  teniposide  theophylline  thyroid medicines  tolbutamide  warfarin  zileuton This list may not describe all possible interactions. Give your health care provider a list of all the medicines, herbs, non-prescription drugs, or dietary supplements you use. Also tell them if you smoke, drink alcohol, or use illegal drugs. Some items may interact with your medicine. What should I watch for while using this medicine? Visit your doctor or health care professional for regular check ups. Check your blood pressure and pulse rate regularly. Ask your health care professional what your blood pressure and pulse rate should be, and when you should contact them. You may get drowsy or dizzy. Do not drive, use machinery, or do anything that needs mental alertness until you know how this drug affects you. Do not stand or sit up quickly, especially if you are an older patient. This reduces the risk of dizzy or fainting spells. Alcohol can make you more drowsy and dizzy. Avoid alcoholic drinks. This medicine may increase blood sugar. Ask your healthcare provider if changes in diet or medicines are needed if you have diabetes. Do not treat yourself for coughs, colds, or pain while you are taking this medicine without asking your doctor or health care professional for advice. Some ingredients may increase your blood pressure. What side effects may I notice from receiving this medicine? Side effects that you should report to your doctor or health care professional as soon as possible:  allergic reactions  like skin rash, itching or hives, swelling of the face, lips, or tongue  breathing problems  cold hands or feet  difficulty sleeping, nightmares  dry peeling skin  hallucinations  muscle cramps or weakness   signs and symptoms of high blood sugar such as being more thirsty or hungry or having to urinate more than normal. You may also feel very tired or have blurry vision.  slow heart rate  swelling of the legs and ankles  vomiting Side effects that usually do not require medical attention (report to your doctor or health care professional if they continue or are bothersome):  change in sex drive or performance  diarrhea  dry sore eyes  hair loss  nausea  weak or tired This list may not describe all possible side effects. Call your doctor for medical advice about side effects. You may report side effects to FDA at 1-800-FDA-1088. Where should I keep my medicine? Keep out of the reach of children. Store at room temperature between 15 and 30 degrees C (59 and 86 degrees F). Protect from light. Throw away any unused medicine after the expiration date. NOTE: This sheet is a summary. It may not cover all possible information. If you have questions about this medicine, talk to your doctor, pharmacist, or health care provider.  2020 Elsevier/Gold Standard (2017-11-27 08:41:59)

## 2019-02-10 NOTE — Progress Notes (Signed)
I agree with the above plan 

## 2019-02-17 ENCOUNTER — Ambulatory Visit: Payer: Medicaid Other | Admitting: Physical Therapy

## 2019-02-17 ENCOUNTER — Ambulatory Visit: Payer: Medicaid Other | Admitting: Occupational Therapy

## 2019-02-17 ENCOUNTER — Ambulatory Visit: Payer: Medicaid Other | Admitting: Speech Pathology

## 2019-02-19 ENCOUNTER — Ambulatory Visit: Payer: Medicaid Other | Admitting: Physical Therapy

## 2019-02-19 ENCOUNTER — Ambulatory Visit: Payer: Medicaid Other | Admitting: Speech Pathology

## 2019-02-24 ENCOUNTER — Encounter: Payer: Medicaid Other | Admitting: Speech Pathology

## 2019-02-24 ENCOUNTER — Ambulatory Visit: Payer: Medicaid Other

## 2019-02-26 ENCOUNTER — Ambulatory Visit: Payer: Medicaid Other | Admitting: Speech Pathology

## 2019-02-26 ENCOUNTER — Ambulatory Visit: Payer: Medicaid Other | Admitting: Physical Therapy

## 2019-03-03 ENCOUNTER — Ambulatory Visit: Payer: Medicaid Other | Admitting: Physical Therapy

## 2019-03-06 ENCOUNTER — Ambulatory Visit: Payer: Medicaid Other

## 2019-03-10 ENCOUNTER — Ambulatory Visit: Payer: Medicaid Other | Admitting: Physical Therapy

## 2019-03-12 ENCOUNTER — Encounter: Payer: Medicaid Other | Admitting: Speech Pathology

## 2019-03-12 ENCOUNTER — Ambulatory Visit: Payer: Medicaid Other | Admitting: Physical Therapy

## 2019-03-17 ENCOUNTER — Ambulatory Visit: Payer: Medicaid Other | Admitting: Physical Therapy

## 2019-03-20 ENCOUNTER — Ambulatory Visit: Payer: Medicaid Other

## 2019-04-09 ENCOUNTER — Encounter (HOSPITAL_COMMUNITY): Payer: Self-pay

## 2019-04-09 ENCOUNTER — Emergency Department (HOSPITAL_COMMUNITY): Payer: No Typology Code available for payment source

## 2019-04-09 ENCOUNTER — Inpatient Hospital Stay (HOSPITAL_COMMUNITY)
Admission: EM | Admit: 2019-04-09 | Discharge: 2019-04-12 | DRG: 065 | Disposition: A | Discharge status: Home / Self Care | Payer: No Typology Code available for payment source | Attending: Internal Medicine | Admitting: Internal Medicine

## 2019-04-09 DIAGNOSIS — Z7982 Long term (current) use of aspirin: Secondary | ICD-10-CM

## 2019-04-09 DIAGNOSIS — I63411 Cerebral infarction due to embolism of right middle cerebral artery: Secondary | ICD-10-CM | POA: Diagnosis not present

## 2019-04-09 DIAGNOSIS — Z6830 Body mass index (BMI) 30.0-30.9, adult: Secondary | ICD-10-CM

## 2019-04-09 DIAGNOSIS — R200 Anesthesia of skin: Secondary | ICD-10-CM | POA: Diagnosis not present

## 2019-04-09 DIAGNOSIS — I63511 Cerebral infarction due to unspecified occlusion or stenosis of right middle cerebral artery: Secondary | ICD-10-CM | POA: Diagnosis present

## 2019-04-09 DIAGNOSIS — K219 Gastro-esophageal reflux disease without esophagitis: Secondary | ICD-10-CM | POA: Diagnosis present

## 2019-04-09 DIAGNOSIS — N920 Excessive and frequent menstruation with regular cycle: Secondary | ICD-10-CM | POA: Diagnosis present

## 2019-04-09 DIAGNOSIS — R2990 Unspecified symptoms and signs involving the nervous system: Secondary | ICD-10-CM | POA: Diagnosis present

## 2019-04-09 DIAGNOSIS — Z8616 Personal history of COVID-19: Secondary | ICD-10-CM

## 2019-04-09 DIAGNOSIS — F319 Bipolar disorder, unspecified: Secondary | ICD-10-CM | POA: Diagnosis present

## 2019-04-09 DIAGNOSIS — E876 Hypokalemia: Secondary | ICD-10-CM | POA: Diagnosis present

## 2019-04-09 DIAGNOSIS — R531 Weakness: Secondary | ICD-10-CM

## 2019-04-09 DIAGNOSIS — D75839 Thrombocytosis, unspecified: Secondary | ICD-10-CM | POA: Diagnosis present

## 2019-04-09 DIAGNOSIS — G43909 Migraine, unspecified, not intractable, without status migrainosus: Secondary | ICD-10-CM | POA: Diagnosis present

## 2019-04-09 DIAGNOSIS — G4733 Obstructive sleep apnea (adult) (pediatric): Secondary | ICD-10-CM | POA: Diagnosis present

## 2019-04-09 DIAGNOSIS — D509 Iron deficiency anemia, unspecified: Secondary | ICD-10-CM | POA: Diagnosis present

## 2019-04-09 DIAGNOSIS — R29701 NIHSS score 1: Secondary | ICD-10-CM | POA: Diagnosis present

## 2019-04-09 DIAGNOSIS — G8194 Hemiplegia, unspecified affecting left nondominant side: Secondary | ICD-10-CM | POA: Diagnosis present

## 2019-04-09 DIAGNOSIS — E538 Deficiency of other specified B group vitamins: Secondary | ICD-10-CM | POA: Diagnosis present

## 2019-04-09 DIAGNOSIS — I10 Essential (primary) hypertension: Secondary | ICD-10-CM | POA: Diagnosis present

## 2019-04-09 DIAGNOSIS — G43709 Chronic migraine without aura, not intractable, without status migrainosus: Secondary | ICD-10-CM | POA: Diagnosis present

## 2019-04-09 DIAGNOSIS — D259 Leiomyoma of uterus, unspecified: Secondary | ICD-10-CM | POA: Diagnosis present

## 2019-04-09 DIAGNOSIS — R7989 Other specified abnormal findings of blood chemistry: Secondary | ICD-10-CM | POA: Diagnosis present

## 2019-04-09 DIAGNOSIS — IMO0002 Reserved for concepts with insufficient information to code with codable children: Secondary | ICD-10-CM | POA: Diagnosis present

## 2019-04-09 DIAGNOSIS — R519 Headache, unspecified: Secondary | ICD-10-CM

## 2019-04-09 DIAGNOSIS — Z823 Family history of stroke: Secondary | ICD-10-CM

## 2019-04-09 DIAGNOSIS — K5900 Constipation, unspecified: Secondary | ICD-10-CM | POA: Diagnosis present

## 2019-04-09 DIAGNOSIS — Z79899 Other long term (current) drug therapy: Secondary | ICD-10-CM

## 2019-04-09 DIAGNOSIS — D473 Essential (hemorrhagic) thrombocythemia: Secondary | ICD-10-CM | POA: Diagnosis present

## 2019-04-09 DIAGNOSIS — E669 Obesity, unspecified: Secondary | ICD-10-CM | POA: Diagnosis present

## 2019-04-09 DIAGNOSIS — D72829 Elevated white blood cell count, unspecified: Secondary | ICD-10-CM | POA: Diagnosis present

## 2019-04-09 LAB — URINALYSIS, ROUTINE W REFLEX MICROSCOPIC
Bilirubin Urine: NEGATIVE
Glucose, UA: NEGATIVE mg/dL
Hgb urine dipstick: NEGATIVE
Ketones, ur: NEGATIVE mg/dL
Leukocytes,Ua: NEGATIVE
Nitrite: NEGATIVE
Protein, ur: NEGATIVE mg/dL
Specific Gravity, Urine: 1.015 (ref 1.005–1.030)
pH: 8 (ref 5.0–8.0)

## 2019-04-09 LAB — COMPREHENSIVE METABOLIC PANEL
ALT: 12 U/L (ref 0–44)
AST: 20 U/L (ref 15–41)
Albumin: 3.9 g/dL (ref 3.5–5.0)
Alkaline Phosphatase: 64 U/L (ref 38–126)
Anion gap: 8 (ref 5–15)
BUN: 6 mg/dL (ref 6–20)
CO2: 23 mmol/L (ref 22–32)
Calcium: 9 mg/dL (ref 8.9–10.3)
Chloride: 107 mmol/L (ref 98–111)
Creatinine, Ser: 0.93 mg/dL (ref 0.44–1.00)
GFR calc Af Amer: >60 mL/min (ref >60)
GFR calc non Af Amer: >60 mL/min (ref >60)
Glucose, Bld: 113 mg/dL — ABNORMAL HIGH (ref 70–99)
Potassium: 4 mmol/L (ref 3.5–5.1)
Sodium: 138 mmol/L (ref 135–145)
Total Bilirubin: 0.5 mg/dL (ref 0.3–1.2)
Total Protein: 7.2 g/dL (ref 6.5–8.1)

## 2019-04-09 LAB — DIFFERENTIAL
Abs Immature Granulocytes: 0.04 10*3/uL (ref 0.00–0.07)
Basophils Absolute: 0.1 10*3/uL (ref 0.0–0.1)
Basophils Relative: 1 %
Eosinophils Absolute: 0 10*3/uL (ref 0.0–0.5)
Eosinophils Relative: 0 %
Immature Granulocytes: 0 %
Lymphocytes Relative: 12 %
Lymphs Abs: 1.5 10*3/uL (ref 0.7–4.0)
Monocytes Absolute: 0.6 10*3/uL (ref 0.1–1.0)
Monocytes Relative: 4 %
Neutro Abs: 10.9 10*3/uL — ABNORMAL HIGH (ref 1.7–7.7)
Neutrophils Relative %: 83 %

## 2019-04-09 LAB — CBC
HCT: 25.7 % — ABNORMAL LOW (ref 36.0–46.0)
Hemoglobin: 6.7 g/dL — CL (ref 12.0–15.0)
MCH: 16.2 pg — ABNORMAL LOW (ref 26.0–34.0)
MCHC: 26.1 g/dL — ABNORMAL LOW (ref 30.0–36.0)
MCV: 62.1 fL — ABNORMAL LOW (ref 80.0–100.0)
Platelets: 1072 10*3/uL (ref 150–400)
RBC: 4.14 MIL/uL (ref 3.87–5.11)
RDW: 26.3 % — ABNORMAL HIGH (ref 11.5–15.5)
WBC: 13.1 10*3/uL — ABNORMAL HIGH (ref 4.0–10.5)
nRBC: 0.2 % (ref 0.0–0.2)

## 2019-04-09 LAB — ETHANOL: Alcohol, Ethyl (B): <10 mg/dL (ref <10)

## 2019-04-09 LAB — RAPID URINE DRUG SCREEN, HOSP PERFORMED
Amphetamines: NOT DETECTED
Barbiturates: NOT DETECTED
Benzodiazepines: NOT DETECTED
Cocaine: NOT DETECTED
Opiates: NOT DETECTED
Tetrahydrocannabinol: NOT DETECTED

## 2019-04-09 LAB — PREPARE RBC (CROSSMATCH)

## 2019-04-09 LAB — I-STAT CHEM 8, ED
BUN: 7 mg/dL (ref 6–20)
Calcium, Ion: 1.11 mmol/L — ABNORMAL LOW (ref 1.15–1.40)
Chloride: 107 mmol/L (ref 98–111)
Creatinine, Ser: 0.7 mg/dL (ref 0.44–1.00)
Glucose, Bld: 111 mg/dL — ABNORMAL HIGH (ref 70–99)
HCT: 27 % — ABNORMAL LOW (ref 36.0–46.0)
Hemoglobin: 9.2 g/dL — ABNORMAL LOW (ref 12.0–15.0)
Potassium: 3.9 mmol/L (ref 3.5–5.1)
Sodium: 139 mmol/L (ref 135–145)
TCO2: 23 mmol/L (ref 22–32)

## 2019-04-09 LAB — I-STAT BETA HCG BLOOD, ED (MC, WL, AP ONLY): I-stat hCG, quantitative: <5 m[IU]/mL (ref <5)

## 2019-04-09 LAB — PROTIME-INR
INR: 1.2 (ref 0.8–1.2)
Prothrombin Time: 14.9 seconds (ref 11.4–15.2)

## 2019-04-09 LAB — APTT: aPTT: 28 seconds (ref 24–36)

## 2019-04-09 MED ORDER — SODIUM CHLORIDE 0.9 % IV SOLN: 10.0000 mL/h | Freq: Once | INTRAVENOUS | Status: DC

## 2019-04-09 MED ORDER — PROCHLORPERAZINE EDISYLATE 10 MG/2ML IJ SOLN
10.0000 mg | Freq: Once | INTRAMUSCULAR | Status: AC
  Administered 2019-04-09: 10 mg via INTRAVENOUS
  Filled 2019-04-09: qty 2

## 2019-04-09 MED ORDER — DIPHENHYDRAMINE HCL 50 MG/ML IJ SOLN
25.0000 mg | Freq: Once | INTRAMUSCULAR | Status: AC
  Administered 2019-04-09: 23:00:00 25 mg via INTRAVENOUS
  Filled 2019-04-09: qty 1

## 2019-04-09 MED ORDER — LACTATED RINGERS IV BOLUS
1000.0000 mL | Freq: Once | INTRAVENOUS | Status: AC
  Administered 2019-04-09: 1000 mL via INTRAVENOUS

## 2019-04-09 MED ORDER — IOHEXOL 350 MG/ML SOLN
100.0000 mL | Freq: Once | INTRAVENOUS | Status: AC | PRN
  Administered 2019-04-09: 100 mL via INTRAVENOUS

## 2019-04-09 NOTE — ED Notes (Signed)
Sent a urine culture with the urine specimen 

## 2019-04-09 NOTE — Consult Note (Signed)
NEURO HOSPITALIST CONSULT NOTE   Requestig physician: Dr. Martinique Hunter  Reason for Consult: Acute onset of worsened left sided weakness in the setting of migraine headache  History obtained from:  Patient and Chart     HPI:                                                                                                                                          Mallory Robinson is an 44 y.o. female with a PMHx of depression, bipolar disorder, CVA, HTN and EtOH dependence who presented to the ED for evaluation of left sided sensory numbness with worsened left sided weakness relative to her baseline impairment secondary to a stroke 2 years ago. The neurological symptoms were accompanied by a right retroorbital headache with some slurred speech. At the time of neurological evaluation, her headache was rated as 3/10 and nonthrobbing, with no associated N/V.   Additional history obtained by EDP has been reviewed: "Patient reports that at 3:30 PM today she began seeing "squiggly lines" and then had numbness and weakness in the left arm and leg followed by a headache behind her right eye, constant, worse with bright lights, nothing makes better.  This episode resolved spontaneously and then recurred approximately 1 hour prior to my evaluation which is what caused the patient to call 911 and return to the ED.  The patient reports this episode is exactly similar to previous episodes.  She reports she has not been told what the cause of the symptoms are, she reports she does not know if she has complex migraines.  She does report that she had a true stroke approximately 2 years ago.  Record review reveals the patient had a similar episode to the one tonight in November of this past year where she was given TPA.  She reports having a mild headache with this episode which is improving, she reports she continues to have left-sided weakness and decreased sensation but no problems with vision, speech,  dizziness, nausea, vomiting."  Past Medical History:  Diagnosis Date  . Bipolar 1 disorder ([place])   . Complication of anesthesia   . Depression   . Hypertension   . PONV (postoperative nausea and vomiting)   . Stroke [facility])     Past Surgical History:  Procedure Laterality Date  . CESAREAN SECTION     X2  . HERNIA REPAIR    . TEE WITHOUT CARDIOVERSION N/A 11/13/2017   Procedure: TRANSESOPHAGEAL ECHOCARDIOGRAM (TEE) bubble study;  Surgeon: Skeet Latch, MD;  Location: [facility] ENDOSCOPY;  Service: Cardiovascular;  Laterality: N/A;    Family History  Problem Relation Age of Onset  . Hypertension Mother   . Stroke Father   . Stroke Maternal Aunt  Social History:  reports that she has never smoked. She has never used smokeless tobacco. She reports current alcohol use. She reports that she does not use drugs.  Allergies  Allergen Reactions  . Food Itching and Swelling    Reaction to Melons (lips and eyes swelled)  . Penicillins Hives, Itching and Other (See Comments)    Hair fell out Did it involve swelling of the face/tongue/throat, SOB, or low BP? No Did it involve sudden or severe rash/hives, skin peeling, or any reaction on the inside of your mouth or nose? Yes Did you need to seek medical attention at a hospital or doctor's office? In hospital reaction When did it last happen?44 yrs old If all above answers are "NO", may proceed with cephalosporin use.    HOME MEDICATIONS:                                                                                                                      No current facility-administered medications on file prior to encounter.   Current Outpatient Medications on File Prior to Encounter  Medication Sig Dispense Refill  . aspirin EC 81 MG tablet Take 81 mg by mouth daily.    . ferrous sulfate 325 (65 FE) MG tablet Take 1 tablet (325 mg total) by mouth 2 (two) times daily with a meal. 60 tablet 0  . folic acid (FOLVITE) 1  MG tablet Take 2 tablets (2 mg total) by mouth daily. (Patient taking differently: Take 1 mg by mouth daily. ) 30 tablet 0  . propranolol (INDERAL) 20 MG tablet Take 1 tablet (20 mg total) by mouth 2 (two) times daily. 60 tablet 4  . aspirin 325 MG tablet Take 1 tablet (325 mg total) by mouth daily. (Patient not taking: Reported on 04/09/2019) 60 tablet 1  . benzonatate (TESSALON) 100 MG capsule Take 1 capsule (100 mg total) by mouth every 8 (eight) hours. (Patient not taking: Reported on 04/09/2019) 21 capsule 0  . topiramate (TOPAMAX) 50 MG tablet Take 0.5 tablets (25 mg total) by mouth 2 (two) times daily for 7 days. (Patient not taking: Reported on 04/09/2019) 7 tablet 0     ROS:                                                                                                                                       As per HPI. Comprehensive ROS otherwise  negative.    Blood pressure (!) 156/98, pulse 71, temperature 98.8 F (37.1 C), temperature source Oral, resp. rate (!) 22, SpO2 99 %.   General Examination:                                                                                                       Physical Exam  HEENT-  [place]/AT   Lungs- Respirations unlabored Extremities- No edema  Neurological Examination Mental Status: Alert, oriented, thought content appropriate.  Speech fluent without evidence of aphasia.  Able to follow all commands without difficulty. No dysarthria.  Cranial Nerves: II: Visual fields intact with no extinction to DSS. PERRL.   III,IV, VI: No ptosis. EOMI without nystagmus.   V,VII: Subtle decreased muscle contraction to LLQ of face when smiling. Facial temp sensation equal bilaterally VIII: hearing intact to voice IX,X: No hypophonia XI: Weak shoulder shrug on left XII: midline tongue extension Motor: RUE and RLE 5/5 LUE 4/5 proximal and distal, including grip LLE 4/5 ADF and APF, otherwise 5/5 Sensory: Temp and light touch intact throughout,  bilaterally. No asymmetry Deep Tendon Reflexes: 2+ and symmetric throughout Plantars: Right: downgoing   Left: downgoing Cerebellar: No ataxia with FNF bilaterally  Gait: Deferred   Lab Results: Basic Metabolic Panel: Recent Labs  Lab 04/09/19 1954 04/09/19 2020  NA 138 139  K 4.0 3.9  CL 107 107  CO2 23  --   GLUCOSE 113* 111*  BUN 6 7  CREATININE 0.93 0.70  CALCIUM 9.0  --     CBC: Recent Labs  Lab 04/09/19 1954 04/09/19 2020  WBC 13.1*  --   NEUTROABS 10.9*  --   HGB 6.7* 9.2*  HCT 25.7* 27.0*  MCV 62.1*  --   PLT 1,072*  --     Cardiac Enzymes: No results for input(s): CKTOTAL, CKMB, CKMBINDEX, TROPONINI in the last 168 hours.  Lipid Panel: No results for input(s): CHOL, TRIG, HDL, CHOLHDL, VLDL, LDLCALC in the last 168 hours.  Imaging: CT HEAD CODE STROKE WO CONTRAST  Result Date: 04/09/2019 CLINICAL DATA:  Code stroke. 44 year old female with left side numbness. 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 EXAM: CT HEAD WITHOUT CONTRAST TECHNIQUE: Contiguous axial images were obtained from the base of the skull through the vertex without intravenous contrast. COMPARISON:  Head CT without contrast 12/28/2018. Brain MRI and intracranial MRA, CTA head and neck 12/27/2018. 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 No midline shift, ventriculomegaly, mass effect, evidence of mass lesion, intracranial hemorrhage or evidence of cortically based acute infarction. Gray-white matter differentiation in the left hemisphere and posterior fossa remains normal. Vascular: No suspicious intracranial vascular hyperdensity. Skull: No acute osseous abnormality identified. Sinuses/Orbits: Visualized paranasal sinuses and mastoids are stable and well pneumatized. Other: Visualized orbits and scalp soft tissues are within normal limits.  ASPECTS [facility] Stroke Program Early CT Score) Total score (0-10 with 10 being normal): 10 IMPRESSION: 1. No acute cortically  based infarct or acute intracranial hemorrhage identified. 2. 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 ASPECTS 10. 3. These results were communicated to Dr. Cheral Marker at 8:45 pm on 04/09/2019 by text page via the [facility] messaging system. Electronically Signed   By: Genevie Ann M.D.   On: 04/09/2019 20:45    Assessment: 44 year old patient with history of stroke with left sided weakness, migraine headache and complicated migraines, now presenting with worsening of chronic left sided motor weakness 1. Exam reveals left sided weakness, consistent with prior right MCA strokes. Patient states that the weakness was worse than her baseline on presentation, but that her symptoms have now improved back to her baseline. Most likely components of the DDx for worsening relative to her baseline include new stroke/TIA versus complicated migraine.  2. CT head:  No acute cortically based infarct or acute intracranial hemorrhage identified. Scattered small areas of right MCA territory encephalomalacia are stable by CT  Recommendations: 1. Risks of tPA significantly outweigh benefits given high likelihood of complicated migraine as the etiology, as well as patient symptoms resolved back to her baseline of mild left sided weakness.   2. Treatment of migraine headache with migraine cocktail per EDP. Bolus IVF.  3. MRI brain 4. Continue ASA  Addendum: -- The patient experienced acute worsening of her left sided weakness as well as right monocular vision loss after the above assessment was completed.  -- STAT CTA showed no LVO. Chronic thrombus versus more likely a small web on chronic soft plaque is seen in the right ICA bulb, measuring 3 mm; this finding is unchanged relative to the prior study -- After STAT CTA, repeat Neurological exam reveals return of baseline  motor function x 4. Vision deficit in right eye is now almost completely resolved -- MRI brain is pending.   CTA/CP report conclusions: 1. Negative for emergent large vessel occlusion, negative CTP, and stable CTA appearance of the head and neck since November. 2. Unchanged small 3 mm focus of hypodensity in the right ICA bulb, now favored to be a small web or chronic soft plaque rather than adherent thrombus. 3. No other atherosclerosis identified. No significant arterial stenosis.   Electronically signed: Dr. Kerney Elbe 04/09/2019, 9:41 PM

## 2019-04-09 NOTE — H&P (Addendum)
History and Physical    SALEENA LIEGEL [id number] DOB: 1975-08-08 DOA: 04/09/2019  PCP: Nolene Ebbs, MD   Patient coming from: Home   Chief Complaint: Left sided numbness and weakness, headache, right eye vision disturbance   HPI: Mallory Robinson is a 44 y.o. female with medical history significant for bipolar disorder, migraines, menorrhagia, hypertension, and stroke with left-sided weakness, now presenting to the ED for evaluation of left-sided numbness and weakness as well as visual disturbance involving the right eye.  She had been in her usual state of health until approximately 3 PM when she experienced worsening in her chronic left-sided weakness, left-sided numbness, and slurred speech.  This was associated with dull ache behind the right eye and zigzag lines that resolved when she closed her right eye.  She notes that the symptoms are very similar to what she has experienced previously.  There was no preceding trauma.  She developed fevers, chills, and respiratory symptoms in early December, was diagnosed with COVID-19, and reports making a full recovery from that.  She continues to experience menorrhagia, saw OB/GYN for this, was diagnosed with uterine fibroids, and is considering surgery for this.  She had been on iron supplementation but stopped taking her iron pills regularly due to constipation.  ED Course: Upon arrival to the ED, patient is found to be afebrile, saturating well on room air, and with blood pressure 163/96.  EKG features sinus rhythm and noncontrast head CT is negative for acute intracranial abnormality but notable for scattered right MCA territory encephalomalacia that appears stable.  Chemistry panel was unremarkable and CBC notable for leukocytosis to 13,100, microcytic anemia with hemoglobin 6.7, and thrombocytosis with platelets 1,072,000.  UDS was negative, urinalysis unremarkable, and ethanol undetectable.  CTA head and neck and CT perfusion study is negative for  emergent large vessel occlusion and stable subtle round hypodense area at the right carotid bifurcation, stable carotid tortuosity, and negative perfusion study.  Patient was given a liter of lactated Ringer's, Benadryl, Compazine, and 1 unit of packed red blood cells in the ED.  Neurology was consulted by the ED physician and recommended MRI brain and medical admission.   Review of Systems:  All other systems reviewed and apart from HPI, are negative.  Past Medical History:  Diagnosis Date  . Bipolar 1 disorder ([place])   . Complication of anesthesia   . Depression   . Hypertension   . PONV (postoperative nausea and vomiting)   . Stroke [facility])     Past Surgical History:  Procedure Laterality Date  . CESAREAN SECTION     X2  . HERNIA REPAIR    . TEE WITHOUT CARDIOVERSION N/A 11/13/2017   Procedure: TRANSESOPHAGEAL ECHOCARDIOGRAM (TEE) bubble study;  Surgeon: Skeet Latch, MD;  Location: [place];  Service: Cardiovascular;  Laterality: N/A;     reports that she has never smoked. She has never used smokeless tobacco. She reports current alcohol use. She reports that she does not use drugs.  Allergies  Allergen Reactions  . Food Itching and Swelling    Reaction to Melons (lips and eyes swelled)  . Penicillins Hives, Itching and Other (See Comments)    Hair fell out Did it involve swelling of the face/tongue/throat, SOB, or low BP? No Did it involve sudden or severe rash/hives, skin peeling, or any reaction on the inside of your mouth or nose? Yes Did you need to seek medical attention at a hospital or doctor's office? In hospital reaction When did it  last happen?44 yrs old If all above answers are "NO", may proceed with cephalosporin use.    Family History  Problem Relation Age of Onset  . Hypertension Mother   . Stroke Father   . Stroke Maternal Aunt      Prior to Admission medications   Medication Sig Start Date End Date Taking? Authorizing Provider  aspirin  EC 81 MG tablet Take 81 mg by mouth daily.   Yes [provider]  ferrous sulfate 325 (65 FE) MG tablet Take 1 tablet (325 mg total) by mouth 2 (two) times daily with a meal. 11/13/17  Yes Ghimire, Henreitta Leber, MD  folic acid (FOLVITE) 1 MG tablet Take 2 tablets (2 mg total) by mouth daily. Patient taking differently: Take 1 mg by mouth daily.  11/14/17  Yes Ghimire, Henreitta Leber, MD  propranolol (INDERAL) 20 MG tablet Take 1 tablet (20 mg total) by mouth 2 (two) times daily. 02/10/19  Yes Frann Rider, NP  aspirin 325 MG tablet Take 1 tablet (325 mg total) by mouth daily. Patient not taking: Reported on 04/09/2019 12/30/18   August Albino, NP  benzonatate (TESSALON) 100 MG capsule Take 1 capsule (100 mg total) by mouth every 8 (eight) hours. Patient not taking: Reported on 04/09/2019 01/24/19   Hall-Potvin, [place], PA-C  topiramate (TOPAMAX) 50 MG tablet Take 0.5 tablets (25 mg total) by mouth 2 (two) times daily for 7 days. Patient not taking: Reported on 04/09/2019 02/10/19 04/09/19  Frann Rider, NP    Physical Exam: Vitals:   04/09/19 2145 04/09/19 2215 04/09/19 2255 04/09/19 2310  BP: (!) 143/88 (!) 163/96 (!) 155/100 (!) 166/104  Pulse: 72 78 72 85  Resp: (!) 28 (!) 25 (!) 22 (!) 25  Temp:   99 F (37.2 C) 98.9 F (37.2 C)  TempSrc:   Oral Oral  SpO2: 99% 100%       Constitutional: NAD, calm  Eyes: PERTLA, lids and conjunctivae normal ENMT: Mucous membranes are moist. Posterior pharynx clear of any exudate or lesions.   Neck: normal, supple, no masses, no thyromegaly Respiratory: clear to auscultation bilaterally, no wheezing, no crackles. No accessory muscle use.  Cardiovascular: S1 & S2 heard, regular rate and rhythm. No extremity edema.   Abdomen: No distension, no tenderness, soft. Bowel sounds active.  Musculoskeletal: no clubbing / cyanosis. No joint deformity upper and lower extremities.   Skin: no significant rashes, lesions, ulcers. Warm, dry,  well-perfused. Neurologic: CN 2-12 grossly intact. Sensation to light touch intact, patellar DTRs normal. Strength 4/5 throughout LUE, 5/5 throughout LE's and RUE.   Psychiatric: Alert and oriented x 3. Pleasant and cooperative.    Labs and Imaging on Admission: I have personally reviewed following labs and imaging studies  CBC: Recent Labs  Lab 04/09/19 1954 04/09/19 2020  WBC 13.1*  --   NEUTROABS 10.9*  --   HGB 6.7* 9.2*  HCT 25.7* 27.0*  MCV 62.1*  --   PLT 1,072*  --    Basic Metabolic Panel: Recent Labs  Lab 04/09/19 1954 04/09/19 2020  NA 138 139  K 4.0 3.9  CL 107 107  CO2 23  --   GLUCOSE 113* 111*  BUN 6 7  CREATININE 0.93 0.70  CALCIUM 9.0  --    GFR: CrCl cannot be calculated (Unknown ideal weight.). Liver Function Tests: Recent Labs  Lab 04/09/19 1954  AST 20  ALT 12  ALKPHOS 64  BILITOT 0.5  PROT 7.2  ALBUMIN 3.9  No results for input(s): LIPASE, AMYLASE in the last 168 hours. No results for input(s): AMMONIA in the last 168 hours. Coagulation Profile: Recent Labs  Lab 04/09/19 1954  INR 1.2   Cardiac Enzymes: No results for input(s): CKTOTAL, CKMB, CKMBINDEX, TROPONINI in the last 168 hours. BNP (last 3 results) No results for input(s): PROBNP in the last 8760 hours. HbA1C: No results for input(s): HGBA1C in the last 72 hours. CBG: No results for input(s): GLUCAP in the last 168 hours. Lipid Profile: No results for input(s): CHOL, HDL, LDLCALC, TRIG, CHOLHDL, LDLDIRECT in the last 72 hours. Thyroid Function Tests: No results for input(s): TSH, T4TOTAL, FREET4, T3FREE, THYROIDAB in the last 72 hours. Anemia Panel: No results for input(s): VITAMINB12, FOLATE, FERRITIN, TIBC, IRON, RETICCTPCT in the last 72 hours. Urine analysis:    Component Value Date/Time   COLORURINE YELLOW 04/09/2019 2005   APPEARANCEUR CLEAR 04/09/2019 2005   LABSPEC 1.015 04/09/2019 2005   PHURINE 8.0 04/09/2019 2005   GLUCOSEU NEGATIVE 04/09/2019 2005    HGBUR NEGATIVE 04/09/2019 2005   BILIRUBINUR NEGATIVE 04/09/2019 2005   [place] 04/09/2019 2005   PROTEINUR NEGATIVE 04/09/2019 2005   UROBILINOGEN 0.2 07/28/2012 1838   NITRITE NEGATIVE 04/09/2019 2005   LEUKOCYTESUR NEGATIVE 04/09/2019 2005   Sepsis Labs: @LABRCNTIP (procalcitonin:4,lacticidven:4) )No results found for this or any previous visit (from the past 240 hour(s)).   Radiological Exams on Admission: CT Code Stroke CTA Head W/WO contrast  Result Date: 04/09/2019 CLINICAL DATA:  44 year old female with left side numbness. 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 EXAM: CT ANGIOGRAPHY HEAD AND NECK CT PERFUSION BRAIN TECHNIQUE: Multidetector CT imaging of the head and neck was performed using the standard protocol during bolus administration of intravenous contrast. Multiplanar CT image reconstructions and MIPs were obtained to evaluate the vascular anatomy. Carotid stenosis measurements (when applicable) are obtained utilizing NASCET criteria, using the distal internal carotid diameter as the denominator. Multiphase CT imaging of the brain was performed following IV bolus contrast injection. Subsequent parametric perfusion maps were calculated using RAPID software. CONTRAST:  178mL OMNIPAQUE IOHEXOL 350 MG/ML SOLN COMPARISON:  Plain head CT 2033 hours today. FINDINGS: CT Brain Perfusion Findings: ASPECTS: 10 CBF (<30%) Volume: None Perfusion (Tmax>6.0s) volume: None Mismatch Volume: Not applicable Infarction Location:Not applicable CTA NECK Skeleton: No acute osseous abnormality identified. Upper chest: Negative. Other neck: Negative. Aortic arch: 3 vessel arch configuration with no arch or proximal great vessel atherosclerosis. Right carotid system: Negative right CCA. At the right carotid bifurcation there is a subtle persistent small round hypodense area at the posterior bulb measuring about 3 mm (series  7, image 86). This appears unchanged since November. Otherwise the right bifurcation is widely patent. Negative cervical right ICA otherwise, mild tortuosity distal to the bulb. Left carotid system: Stable and negative aside from tortuosity. Vertebral arteries: Proximal right subclavian artery and right vertebral artery origin are normal. The right vertebral artery is patent to the skull base with tortuosity but no plaque or stenosis. Proximal left subclavian artery and left vertebral artery origin are normal aside from tortuosity. The left vertebral appears mildly dominant, with V2 segment tortuosity but no plaque or stenosis to the skull base. CTA HEAD Posterior circulation: Tortuous distal vertebral arteries with no plaque or stenosis. Patent PICA origins and vertebrobasilar junction. Tortuous basilar artery is patent without stenosis. Basilar tip, SCA and PCA origins appear stable and within normal limits. Posterior communicating arteries  are diminutive or absent. Bilateral PCA branches are stable and within normal limits. Anterior circulation: Both ICA siphons are patent. There is tortuosity and mild irregularity of the left siphon with no discrete plaque or stenosis. This is stable. Right siphon is tortuous without plaque or stenosis. Patent carotid termini. MCA and ACA origins remain normal. Mildly dominant right A1 segment. Tortuous A1s. Anterior communicating artery and bilateral ACA branches are within normal limits. Left MCA M1 segment and bifurcation are patent without stenosis. Left MCA branches are stable. Right MCA M1 segment and bifurcation are patent without stenosis. Right MCA branches are stable. Venous sinuses: Stable patency since November. Dominant appearing right transverse and sigmoid sinuses, with some effacement of those bilateral sinuses. Anatomic variants: Mildly dominant left vertebral artery, right ACA A1 segment. Review of the MIP images confirms the above findings IMPRESSION: 1.  Negative for emergent large vessel occlusion, negative CTP, and stable CTA appearance of the head and neck since November. 2. Unchanged small 3 mm focus of hypodensity in the right ICA bulb, now favored to be a small web or chronic soft plaque rather than adherent thrombus. 3. No other atherosclerosis identified. No significant arterial stenosis. These results were communicated to Dr. Cheral Marker at 10:20 pm on 04/09/2019 by text page via the [facility] messaging system. Electronically Signed   By: Genevie Ann M.D.   On: 04/09/2019 22:24   CT Code Stroke CTA Neck W/WO contrast  Result Date: 04/09/2019 CLINICAL DATA:  44 year old female with left side numbness. 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 EXAM: CT ANGIOGRAPHY HEAD AND NECK CT PERFUSION BRAIN TECHNIQUE: Multidetector CT imaging of the head and neck was performed using the standard protocol during bolus administration of intravenous contrast. Multiplanar CT image reconstructions and MIPs were obtained to evaluate the vascular anatomy. Carotid stenosis measurements (when applicable) are obtained utilizing NASCET criteria, using the distal internal carotid diameter as the denominator. Multiphase CT imaging of the brain was performed following IV bolus contrast injection. Subsequent parametric perfusion maps were calculated using RAPID software. CONTRAST:  167mL OMNIPAQUE IOHEXOL 350 MG/ML SOLN COMPARISON:  Plain head CT 2033 hours today. FINDINGS: CT Brain Perfusion Findings: ASPECTS: 10 CBF (<30%) Volume: None Perfusion (Tmax>6.0s) volume: None Mismatch Volume: Not applicable Infarction Location:Not applicable CTA NECK Skeleton: No acute osseous abnormality identified. Upper chest: Negative. Other neck: Negative. Aortic arch: 3 vessel arch configuration with no arch or proximal great vessel atherosclerosis. Right carotid system: Negative right CCA. At the right carotid bifurcation there is  a subtle persistent small round hypodense area at the posterior bulb measuring about 3 mm (series 7, image 86). This appears unchanged since November. Otherwise the right bifurcation is widely patent. Negative cervical right ICA otherwise, mild tortuosity distal to the bulb. Left carotid system: Stable and negative aside from tortuosity. Vertebral arteries: Proximal right subclavian artery and right vertebral artery origin are normal. The right vertebral artery is patent to the skull base with tortuosity but no plaque or stenosis. Proximal left subclavian artery and left vertebral artery origin are normal aside from tortuosity. The left vertebral appears mildly dominant, with V2 segment tortuosity but no plaque or stenosis to the skull base. CTA HEAD Posterior circulation: Tortuous distal vertebral arteries with no plaque or stenosis. Patent PICA origins and vertebrobasilar junction. Tortuous basilar artery is patent without stenosis. Basilar tip, SCA and PCA origins appear stable and within normal limits. Posterior communicating arteries are diminutive or  absent. Bilateral PCA branches are stable and within normal limits. Anterior circulation: Both ICA siphons are patent. There is tortuosity and mild irregularity of the left siphon with no discrete plaque or stenosis. This is stable. Right siphon is tortuous without plaque or stenosis. Patent carotid termini. MCA and ACA origins remain normal. Mildly dominant right A1 segment. Tortuous A1s. Anterior communicating artery and bilateral ACA branches are within normal limits. Left MCA M1 segment and bifurcation are patent without stenosis. Left MCA branches are stable. Right MCA M1 segment and bifurcation are patent without stenosis. Right MCA branches are stable. Venous sinuses: Stable patency since November. Dominant appearing right transverse and sigmoid sinuses, with some effacement of those bilateral sinuses. Anatomic variants: Mildly dominant left vertebral  artery, right ACA A1 segment. Review of the MIP images confirms the above findings IMPRESSION: 1. Negative for emergent large vessel occlusion, negative CTP, and stable CTA appearance of the head and neck since November. 2. Unchanged small 3 mm focus of hypodensity in the right ICA bulb, now favored to be a small web or chronic soft plaque rather than adherent thrombus. 3. No other atherosclerosis identified. No significant arterial stenosis. These results were communicated to Dr. Cheral Marker at 10:20 pm on 04/09/2019 by text page via the [facility] messaging system. Electronically Signed   By: Genevie Ann M.D.   On: 04/09/2019 22:24   CT Code Stroke Cerebral Perfusion with contrast  Result Date: 04/09/2019 CLINICAL DATA:  44 year old female with left side numbness. 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 EXAM: CT ANGIOGRAPHY HEAD AND NECK CT PERFUSION BRAIN TECHNIQUE: Multidetector CT imaging of the head and neck was performed using the standard protocol during bolus administration of intravenous contrast. Multiplanar CT image reconstructions and MIPs were obtained to evaluate the vascular anatomy. Carotid stenosis measurements (when applicable) are obtained utilizing NASCET criteria, using the distal internal carotid diameter as the denominator. Multiphase CT imaging of the brain was performed following IV bolus contrast injection. Subsequent parametric perfusion maps were calculated using RAPID software. CONTRAST:  168mL OMNIPAQUE IOHEXOL 350 MG/ML SOLN COMPARISON:  Plain head CT 2033 hours today. FINDINGS: CT Brain Perfusion Findings: ASPECTS: 10 CBF (<30%) Volume: None Perfusion (Tmax>6.0s) volume: None Mismatch Volume: Not applicable Infarction Location:Not applicable CTA NECK Skeleton: No acute osseous abnormality identified. Upper chest: Negative. Other neck: Negative. Aortic arch: 3 vessel arch configuration with no arch or proximal great  vessel atherosclerosis. Right carotid system: Negative right CCA. At the right carotid bifurcation there is a subtle persistent small round hypodense area at the posterior bulb measuring about 3 mm (series 7, image 86). This appears unchanged since November. Otherwise the right bifurcation is widely patent. Negative cervical right ICA otherwise, mild tortuosity distal to the bulb. Left carotid system: Stable and negative aside from tortuosity. Vertebral arteries: Proximal right subclavian artery and right vertebral artery origin are normal. The right vertebral artery is patent to the skull base with tortuosity but no plaque or stenosis. Proximal left subclavian artery and left vertebral artery origin are normal aside from tortuosity. The left vertebral appears mildly dominant, with V2 segment tortuosity but no plaque or stenosis to the skull base. CTA HEAD Posterior circulation: Tortuous distal vertebral arteries with no plaque or stenosis. Patent PICA origins and vertebrobasilar junction. Tortuous basilar artery is patent without stenosis. Basilar tip, SCA and PCA origins appear stable and within normal limits. Posterior communicating arteries are diminutive or absent. Bilateral PCA  branches are stable and within normal limits. Anterior circulation: Both ICA siphons are patent. There is tortuosity and mild irregularity of the left siphon with no discrete plaque or stenosis. This is stable. Right siphon is tortuous without plaque or stenosis. Patent carotid termini. MCA and ACA origins remain normal. Mildly dominant right A1 segment. Tortuous A1s. Anterior communicating artery and bilateral ACA branches are within normal limits. Left MCA M1 segment and bifurcation are patent without stenosis. Left MCA branches are stable. Right MCA M1 segment and bifurcation are patent without stenosis. Right MCA branches are stable. Venous sinuses: Stable patency since November. Dominant appearing right transverse and sigmoid  sinuses, with some effacement of those bilateral sinuses. Anatomic variants: Mildly dominant left vertebral artery, right ACA A1 segment. Review of the MIP images confirms the above findings IMPRESSION: 1. Negative for emergent large vessel occlusion, negative CTP, and stable CTA appearance of the head and neck since November. 2. Unchanged small 3 mm focus of hypodensity in the right ICA bulb, now favored to be a small web or chronic soft plaque rather than adherent thrombus. 3. No other atherosclerosis identified. No significant arterial stenosis. These results were communicated to Dr. Cheral Marker at 10:20 pm on 04/09/2019 by text page via the [facility] messaging system. Electronically Signed   By: Genevie Ann M.D.   On: 04/09/2019 22:24   CT HEAD CODE STROKE WO CONTRAST  Result Date: 04/09/2019 CLINICAL DATA:  Code stroke. 44 year old female with left side numbness. 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 EXAM: CT HEAD WITHOUT CONTRAST TECHNIQUE: Contiguous axial images were obtained from the base of the skull through the vertex without intravenous contrast. COMPARISON:  Head CT without contrast 12/28/2018. Brain MRI and intracranial MRA, CTA head and neck 12/27/2018. 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 No midline shift, ventriculomegaly, mass effect, evidence of mass lesion, intracranial hemorrhage or evidence of cortically based acute infarction. Gray-white matter differentiation in the left hemisphere and posterior fossa remains normal. Vascular: No suspicious intracranial vascular hyperdensity. Skull: No acute osseous abnormality identified. Sinuses/Orbits: Visualized paranasal sinuses and mastoids are stable and well pneumatized. Other: Visualized orbits and scalp soft tissues are within normal limits. ASPECTS [facility] Stroke Program Early CT Score)  Total score (0-10 with 10 being normal): 10 IMPRESSION: 1. No acute cortically based infarct or acute intracranial hemorrhage identified. 2. 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 ASPECTS 10. 3. These results were communicated to Dr. Cheral Marker at 8:45 pm on 04/09/2019 by text page via the [facility] messaging system. Electronically Signed   By: Genevie Ann M.D.   On: 04/09/2019 20:45    EKG: Independently reviewed. Sinus rhythm, rate 73, QTc 448 ms.   Assessment/Plan   1. Left-sided weakness  - Pt with history of right MCA territory CVA and migraines presents with acute worsening in left-sided weakness, also had vision disturbance with stable chronic findings on CT head, CT perfusion, and CTA H&N  - She reports improving sxs and close to baseline by time of admission  - She has been taking ASA, not on statin given recent LDL 32  - Appreciate neurology consultation  - Check MRI brain, treat migraine, continue ASA, frequent neuro checks   ADDENDUM:  MRI reveals multiple punctate foci of acute ischemia, again within Rt MCA territory. 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   2. Microcytic anemia; thrombocytosis  - Hgb was 6.7 in ED with MCV 62.1 and platelets 1072k  - Anemia has been attributed to menorrhagia for which she has seen OBGYN, diagnosed with uterine fibroids, and is considering surgery  - She had evidence for IDA in 2019, iron studies normalized after taking Fe supplements but she has since stopped d/t constipation  - She is being transfused 1 unit of RBC in ED  - Check post-transfusion CBC, encourage resumption of iron supplements with stool softener   3. Chronic migraines  - Frequency and severity has improved with propranolol, she presented with HA that has nearly resolved after treatment in ED    4. Hypertension  - BP modestly elevated in ED  - Patient reports hx of HTN, currently taking just propranalol for migraine ppx   5.  Recent COVID-19  - Patient had positive COVID-19 test on 01/24/2019 and was experiencing fevers and respiratory symptoms that completely resolved  - Our Infection Prevention dept recommends not retesting or instituting special precautions if positive test was >21 but <90 days ago    DVT prophylaxis: Lovenox  Code Status: Full  Family Communication: Discussed with patient  Disposition Plan: Will likely return home pending therapy consultations  Consults called: Neurology  Admission status: Observation     Vianne Bulls, MD [facility] Pager: See [email or web address]  If 7AM-7PM, please contact the daytime attending [email or web address]  04/09/2019, 11:36 PM

## 2019-04-09 NOTE — ED Notes (Signed)
Pt complains of left arm numbness with weakness, headache, decreased visual field in her right eye, and left leg numbness. Regenia Skeeter MD notified.

## 2019-04-09 NOTE — ED Provider Notes (Signed)
[facility] EMERGENCY DEPARTMENT Provider Note   CSN: [id number] Arrival date & time: 04/09/19  1910     History Chief Complaint  Patient presents with  . Numbness  . Headache    Mallory Robinson is a 44 y.o. female.  The history is provided by the patient and medical records.   The patient is a 44 year old female with past medical history of bipolar disorder, depression, hypertension, CVA, alcohol dependence who presents to the ED for headache, left-sided numbness and weakness.  Patient reports that at 3:30 PM today she began seeing "squiggly lines" and then had numbness and weakness in the left arm and leg followed by a headache behind her right eye, constant, worse with bright lights, nothing makes better.  This episode resolved spontaneously and then recurred approximately 1 hour prior to my evaluation which is what caused the patient to call 911 and return to the ED.  The patient reports this episode is exactly similar to previous episodes.  She reports she has not been told what the cause of the symptoms are, she reports she does not know if she has complex migraines.  She does report that she had a true stroke approximately 2 years ago.  Record review reveals the patient had a similar episode to the one tonight in November of this past year where she was given TPA.  She reports having a mild headache with this episode which is improving, she reports she continues to have left-sided weakness and decreased sensation but no problems with vision, speech, dizziness, nausea, vomiting.     Past Medical History:  Diagnosis Date  . Bipolar 1 disorder ([place])   . Complication of anesthesia   . Depression   . Hypertension   . PONV (postoperative nausea and vomiting)   . Stroke [facility])     Patient Active Problem List   Diagnosis Date Noted  . Stroke-like episode 04/09/2019  . Microcytic anemia 04/09/2019  . Thrombocytosis ([place]) 04/09/2019  . Chronic migraine 04/09/2019  .  Hypertension 04/09/2019  . Acute ischemic stroke ([place]) 12/27/2018  . Cerebral embolism with cerebral infarction 11/11/2017  . Acute CVA (cerebrovascular accident) ([place]) 11/11/2017  . Neurological deficit present 11/10/2017  . Bipolar I disorder ([place]) 03/05/2013  . PTSD (post-traumatic stress disorder) 03/05/2013  . Alcohol dependence ([place]) 03/04/2013    Past Surgical History:  Procedure Laterality Date  . CESAREAN SECTION     X2  . HERNIA REPAIR    . TEE WITHOUT CARDIOVERSION N/A 11/13/2017   Procedure: TRANSESOPHAGEAL ECHOCARDIOGRAM (TEE) bubble study;  Surgeon: Skeet Latch, MD;  Location: [facility];  Service: Cardiovascular;  Laterality: N/A;     OB History   No obstetric history on file.     Family History  Problem Relation Age of Onset  . Hypertension Mother   . Stroke Father   . Stroke Maternal Aunt     Social History   Tobacco Use  . Smoking status: Never Smoker  . Smokeless tobacco: Never Used  Substance Use Topics  . Alcohol use: Yes    Comment: Occasional  . Drug use: No    Home Medications Prior to Admission medications   Medication Sig Start Date End Date Taking? Authorizing Provider  aspirin EC 81 MG tablet Take 81 mg by mouth daily.   Yes [provider]  ferrous sulfate 325 (65 FE) MG tablet Take 1 tablet (325 mg total) by mouth 2 (two) times daily with a meal. 11/13/17  Yes Ghimire,  Henreitta Leber, MD  folic acid (FOLVITE) 1 MG tablet Take 2 tablets (2 mg total) by mouth daily. Patient taking differently: Take 1 mg by mouth daily.  11/14/17  Yes Ghimire, Henreitta Leber, MD  propranolol (INDERAL) 20 MG tablet Take 1 tablet (20 mg total) by mouth 2 (two) times daily. 02/10/19  Yes Frann Rider, NP  aspirin 325 MG tablet Take 1 tablet (325 mg total) by mouth daily. Patient not taking: Reported on 04/09/2019 12/30/18   August Albino, NP  benzonatate (TESSALON) 100 MG capsule Take 1 capsule (100 mg total) by mouth every 8 (eight) hours. Patient  not taking: Reported on 04/09/2019 01/24/19   Hall-Potvin, [place], PA-C  topiramate (TOPAMAX) 50 MG tablet Take 0.5 tablets (25 mg total) by mouth 2 (two) times daily for 7 days. Patient not taking: Reported on 04/09/2019 02/10/19 04/09/19  Frann Rider, NP    Allergies    Food and Penicillins  Review of Systems   Review of Systems  Constitutional: Negative for chills and fever.  Respiratory: Negative for cough and shortness of breath.   Gastrointestinal: Negative for abdominal pain, nausea and vomiting.  Neurological: Positive for weakness, numbness and headaches.  All other systems reviewed and are negative.   Physical Exam Updated Vital Signs BP (!) 166/104   Pulse 85   Temp 98.9 F (37.2 C) (Oral)   Resp (!) 25   SpO2 100%   Physical Exam Vitals and nursing note reviewed.  Constitutional:      General: She is not in acute distress.    Appearance: She is well-developed.  HENT:     Head: Normocephalic and atraumatic.  Eyes:     Conjunctiva/sclera: Conjunctivae normal.  Cardiovascular:     Rate and Rhythm: Normal rate and regular rhythm.     Heart sounds: No murmur.  Pulmonary:     Effort: Pulmonary effort is normal. No respiratory distress.     Breath sounds: Normal breath sounds.  Abdominal:     Palpations: Abdomen is soft.     Tenderness: There is no abdominal tenderness.  Musculoskeletal:     Cervical back: Neck supple.  Skin:    General: Skin is warm and dry.  Neurological:     Mental Status: She is alert.     Cranial Nerves: Cranial nerves are intact.     Sensory: Sensory deficit (LUE and LLE) present.     Motor: Weakness (4/5 on LUE and LLE, otherwise 5/5 throughout) present.     Coordination: Finger-Nose-Finger Test normal.     ED Results / Procedures / Treatments   Labs (all labs ordered are listed, but only abnormal results are displayed) Labs Reviewed  CBC - Abnormal; Notable for the following components:      Result Value   WBC 13.1 (*)     Hemoglobin 6.7 (*)    HCT 25.7 (*)    MCV 62.1 (*)    MCH 16.2 (*)    MCHC 26.1 (*)    RDW 26.3 (*)    Platelets 1,072 (*)    All other components within normal limits  DIFFERENTIAL - Abnormal; Notable for the following components:   Neutro Abs 10.9 (*)    All other components within normal limits  COMPREHENSIVE METABOLIC PANEL - Abnormal; Notable for the following components:   Glucose, Bld 113 (*)    All other components within normal limits  I-STAT CHEM 8, ED - Abnormal; Notable for the following components:   Glucose, Bld 111 (*)  Calcium, Ion 1.11 (*)    Hemoglobin 9.2 (*)    HCT 27.0 (*)    All other components within normal limits  ETHANOL  PROTIME-INR  APTT  RAPID URINE DRUG SCREEN, HOSP PERFORMED  URINALYSIS, ROUTINE W REFLEX MICROSCOPIC  PATHOLOGIST SMEAR REVIEW  I-STAT BETA HCG BLOOD, ED ([facility], [facility], [facility] ONLY)  PREPARE RBC (CROSSMATCH)  TYPE AND SCREEN   EKG EKG Interpretation  Date/Time:  Thursday April 09 2019 20:09:07 EST Ventricular Rate:  73 PR Interval:    QRS Duration: 93 QT Interval:  406 QTC Calculation: 448 R Axis:   70 Text Interpretation: Sinus rhythm Borderline T abnormalities, anterior leads Confirmed by Sherwood Gambler [phone]) on 04/09/2019 8:28:29 PM  Radiology CT Code Stroke CTA Head W/WO contrast  Result Date: 04/09/2019 CLINICAL DATA:  44 year old female with left side numbness. 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 EXAM: CT ANGIOGRAPHY HEAD AND NECK CT PERFUSION BRAIN TECHNIQUE: Multidetector CT imaging of the head and neck was performed using the standard protocol during bolus administration of intravenous contrast. Multiplanar CT image reconstructions and MIPs were obtained to evaluate the vascular anatomy. Carotid stenosis measurements (when applicable) are obtained utilizing NASCET criteria, using the distal internal carotid diameter as the denominator.  Multiphase CT imaging of the brain was performed following IV bolus contrast injection. Subsequent parametric perfusion maps were calculated using RAPID software. CONTRAST:  148mL OMNIPAQUE IOHEXOL 350 MG/ML SOLN COMPARISON:  Plain head CT 2033 hours today. FINDINGS: CT Brain Perfusion Findings: ASPECTS: 10 CBF (<30%) Volume: None Perfusion (Tmax>6.0s) volume: None Mismatch Volume: Not applicable Infarction Location:Not applicable CTA NECK Skeleton: No acute osseous abnormality identified. Upper chest: Negative. Other neck: Negative. Aortic arch: 3 vessel arch configuration with no arch or proximal great vessel atherosclerosis. Right carotid system: Negative right CCA. At the right carotid bifurcation there is a subtle persistent small round hypodense area at the posterior bulb measuring about 3 mm (series 7, image 86). This appears unchanged since November. Otherwise the right bifurcation is widely patent. Negative cervical right ICA otherwise, mild tortuosity distal to the bulb. Left carotid system: Stable and negative aside from tortuosity. Vertebral arteries: Proximal right subclavian artery and right vertebral artery origin are normal. The right vertebral artery is patent to the skull base with tortuosity but no plaque or stenosis. Proximal left subclavian artery and left vertebral artery origin are normal aside from tortuosity. The left vertebral appears mildly dominant, with V2 segment tortuosity but no plaque or stenosis to the skull base. CTA HEAD Posterior circulation: Tortuous distal vertebral arteries with no plaque or stenosis. Patent PICA origins and vertebrobasilar junction. Tortuous basilar artery is patent without stenosis. Basilar tip, SCA and PCA origins appear stable and within normal limits. Posterior communicating arteries are diminutive or absent. Bilateral PCA branches are stable and within normal limits. Anterior circulation: Both ICA siphons are patent. There is tortuosity and mild  irregularity of the left siphon with no discrete plaque or stenosis. This is stable. Right siphon is tortuous without plaque or stenosis. Patent carotid termini. MCA and ACA origins remain normal. Mildly dominant right A1 segment. Tortuous A1s. Anterior communicating artery and bilateral ACA branches are within normal limits. Left MCA M1 segment and bifurcation are patent without stenosis. Left MCA branches are stable. Right MCA M1 segment and bifurcation are patent without stenosis. Right MCA branches are stable. Venous sinuses: Stable patency since November. Dominant appearing right transverse and sigmoid sinuses,  with some effacement of those bilateral sinuses. Anatomic variants: Mildly dominant left vertebral artery, right ACA A1 segment. Review of the MIP images confirms the above findings IMPRESSION: 1. Negative for emergent large vessel occlusion, negative CTP, and stable CTA appearance of the head and neck since November. 2. Unchanged small 3 mm focus of hypodensity in the right ICA bulb, now favored to be a small web or chronic soft plaque rather than adherent thrombus. 3. No other atherosclerosis identified. No significant arterial stenosis. These results were communicated to Dr. Cheral Marker at 10:20 pm on 04/09/2019 by text page via the [facility] messaging system. Electronically Signed   By: Genevie Ann M.D.   On: 04/09/2019 22:24   CT Code Stroke CTA Neck W/WO contrast  Result Date: 04/09/2019 CLINICAL DATA:  44 year old female with left side numbness. 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 EXAM: CT ANGIOGRAPHY HEAD AND NECK CT PERFUSION BRAIN TECHNIQUE: Multidetector CT imaging of the head and neck was performed using the standard protocol during bolus administration of intravenous contrast. Multiplanar CT image reconstructions and MIPs were obtained to evaluate the vascular anatomy. Carotid stenosis measurements (when applicable)  are obtained utilizing NASCET criteria, using the distal internal carotid diameter as the denominator. Multiphase CT imaging of the brain was performed following IV bolus contrast injection. Subsequent parametric perfusion maps were calculated using RAPID software. CONTRAST:  163mL OMNIPAQUE IOHEXOL 350 MG/ML SOLN COMPARISON:  Plain head CT 2033 hours today. FINDINGS: CT Brain Perfusion Findings: ASPECTS: 10 CBF (<30%) Volume: None Perfusion (Tmax>6.0s) volume: None Mismatch Volume: Not applicable Infarction Location:Not applicable CTA NECK Skeleton: No acute osseous abnormality identified. Upper chest: Negative. Other neck: Negative. Aortic arch: 3 vessel arch configuration with no arch or proximal great vessel atherosclerosis. Right carotid system: Negative right CCA. At the right carotid bifurcation there is a subtle persistent small round hypodense area at the posterior bulb measuring about 3 mm (series 7, image 86). This appears unchanged since November. Otherwise the right bifurcation is widely patent. Negative cervical right ICA otherwise, mild tortuosity distal to the bulb. Left carotid system: Stable and negative aside from tortuosity. Vertebral arteries: Proximal right subclavian artery and right vertebral artery origin are normal. The right vertebral artery is patent to the skull base with tortuosity but no plaque or stenosis. Proximal left subclavian artery and left vertebral artery origin are normal aside from tortuosity. The left vertebral appears mildly dominant, with V2 segment tortuosity but no plaque or stenosis to the skull base. CTA HEAD Posterior circulation: Tortuous distal vertebral arteries with no plaque or stenosis. Patent PICA origins and vertebrobasilar junction. Tortuous basilar artery is patent without stenosis. Basilar tip, SCA and PCA origins appear stable and within normal limits. Posterior communicating arteries are diminutive or absent. Bilateral PCA branches are stable and within  normal limits. Anterior circulation: Both ICA siphons are patent. There is tortuosity and mild irregularity of the left siphon with no discrete plaque or stenosis. This is stable. Right siphon is tortuous without plaque or stenosis. Patent carotid termini. MCA and ACA origins remain normal. Mildly dominant right A1 segment. Tortuous A1s. Anterior communicating artery and bilateral ACA branches are within normal limits. Left MCA M1 segment and bifurcation are patent without stenosis. Left MCA branches are stable. Right MCA M1 segment and bifurcation are patent without stenosis. Right MCA branches are stable. Venous sinuses: Stable patency since November. Dominant appearing right transverse and sigmoid sinuses, with some effacement  of those bilateral sinuses. Anatomic variants: Mildly dominant left vertebral artery, right ACA A1 segment. Review of the MIP images confirms the above findings IMPRESSION: 1. Negative for emergent large vessel occlusion, negative CTP, and stable CTA appearance of the head and neck since November. 2. Unchanged small 3 mm focus of hypodensity in the right ICA bulb, now favored to be a small web or chronic soft plaque rather than adherent thrombus. 3. No other atherosclerosis identified. No significant arterial stenosis. These results were communicated to Dr. Cheral Marker at 10:20 pm on 04/09/2019 by text page via the [facility] messaging system. Electronically Signed   By: Genevie Ann M.D.   On: 04/09/2019 22:24   CT Code Stroke Cerebral Perfusion with contrast  Result Date: 04/09/2019 CLINICAL DATA:  44 year old female with left side numbness. 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 EXAM: CT ANGIOGRAPHY HEAD AND NECK CT PERFUSION BRAIN TECHNIQUE: Multidetector CT imaging of the head and neck was performed using the standard protocol during bolus administration of intravenous contrast. Multiplanar CT image reconstructions  and MIPs were obtained to evaluate the vascular anatomy. Carotid stenosis measurements (when applicable) are obtained utilizing NASCET criteria, using the distal internal carotid diameter as the denominator. Multiphase CT imaging of the brain was performed following IV bolus contrast injection. Subsequent parametric perfusion maps were calculated using RAPID software. CONTRAST:  115mL OMNIPAQUE IOHEXOL 350 MG/ML SOLN COMPARISON:  Plain head CT 2033 hours today. FINDINGS: CT Brain Perfusion Findings: ASPECTS: 10 CBF (<30%) Volume: None Perfusion (Tmax>6.0s) volume: None Mismatch Volume: Not applicable Infarction Location:Not applicable CTA NECK Skeleton: No acute osseous abnormality identified. Upper chest: Negative. Other neck: Negative. Aortic arch: 3 vessel arch configuration with no arch or proximal great vessel atherosclerosis. Right carotid system: Negative right CCA. At the right carotid bifurcation there is a subtle persistent small round hypodense area at the posterior bulb measuring about 3 mm (series 7, image 86). This appears unchanged since November. Otherwise the right bifurcation is widely patent. Negative cervical right ICA otherwise, mild tortuosity distal to the bulb. Left carotid system: Stable and negative aside from tortuosity. Vertebral arteries: Proximal right subclavian artery and right vertebral artery origin are normal. The right vertebral artery is patent to the skull base with tortuosity but no plaque or stenosis. Proximal left subclavian artery and left vertebral artery origin are normal aside from tortuosity. The left vertebral appears mildly dominant, with V2 segment tortuosity but no plaque or stenosis to the skull base. CTA HEAD Posterior circulation: Tortuous distal vertebral arteries with no plaque or stenosis. Patent PICA origins and vertebrobasilar junction. Tortuous basilar artery is patent without stenosis. Basilar tip, SCA and PCA origins appear stable and within normal limits.  Posterior communicating arteries are diminutive or absent. Bilateral PCA branches are stable and within normal limits. Anterior circulation: Both ICA siphons are patent. There is tortuosity and mild irregularity of the left siphon with no discrete plaque or stenosis. This is stable. Right siphon is tortuous without plaque or stenosis. Patent carotid termini. MCA and ACA origins remain normal. Mildly dominant right A1 segment. Tortuous A1s. Anterior communicating artery and bilateral ACA branches are within normal limits. Left MCA M1 segment and bifurcation are patent without stenosis. Left MCA branches are stable. Right MCA M1 segment and bifurcation are patent without stenosis. Right MCA branches are stable. Venous sinuses: Stable patency since November. Dominant appearing right transverse and sigmoid sinuses, with some effacement of those bilateral  sinuses. Anatomic variants: Mildly dominant left vertebral artery, right ACA A1 segment. Review of the MIP images confirms the above findings IMPRESSION: 1. Negative for emergent large vessel occlusion, negative CTP, and stable CTA appearance of the head and neck since November. 2. Unchanged small 3 mm focus of hypodensity in the right ICA bulb, now favored to be a small web or chronic soft plaque rather than adherent thrombus. 3. No other atherosclerosis identified. No significant arterial stenosis. These results were communicated to Dr. Cheral Marker at 10:20 pm on 04/09/2019 by text page via the [facility] messaging system. Electronically Signed   By: Genevie Ann M.D.   On: 04/09/2019 22:24   CT HEAD CODE STROKE WO CONTRAST  Result Date: 04/09/2019 CLINICAL DATA:  Code stroke. 44 year old female with left side numbness. 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 EXAM: CT HEAD WITHOUT CONTRAST TECHNIQUE: Contiguous axial images were obtained from the base of the skull through the vertex without intravenous contrast.  COMPARISON:  Head CT without contrast 12/28/2018. Brain MRI and intracranial MRA, CTA head and neck 12/27/2018. 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 No midline shift, ventriculomegaly, mass effect, evidence of mass lesion, intracranial hemorrhage or evidence of cortically based acute infarction. Gray-white matter differentiation in the left hemisphere and posterior fossa remains normal. Vascular: No suspicious intracranial vascular hyperdensity. Skull: No acute osseous abnormality identified. Sinuses/Orbits: Visualized paranasal sinuses and mastoids are stable and well pneumatized. Other: Visualized orbits and scalp soft tissues are within normal limits. ASPECTS [facility] Stroke Program Early CT Score) Total score (0-10 with 10 being normal): 10 IMPRESSION: 1. No acute cortically based infarct or acute intracranial hemorrhage identified. 2. 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 ASPECTS 10. 3. These results were communicated to Dr. Cheral Marker at 8:45 pm on 04/09/2019 by text page via the [facility] messaging system. Electronically Signed   By: Genevie Ann M.D.   On: 04/09/2019 20:45    Procedures Procedures (including critical care time)  Medications Ordered in ED Medications  0.9 %  sodium chloride infusion (has no administration in time range)  lactated ringers bolus 1,000 mL (1,000 mLs Intravenous New Bag/Given 04/09/19 2234)  prochlorperazine (COMPAZINE) injection 10 mg (10 mg Intravenous Given 04/09/19 2233)  diphenhydrAMINE (BENADRYL) injection 25 mg (25 mg Intravenous Given 04/09/19 2232)  iohexol (OMNIPAQUE) 350 MG/ML injection 100 mL (100 mLs Intravenous Contrast Given 04/09/19 2201)    ED Course  I have reviewed the triage vital signs and the nursing notes.  Pertinent labs & imaging results that were available during my care of the patient were reviewed by me and  considered in my medical decision making (see chart for details).    MDM Rules/Calculators/A&P                      The patient is a 44 year old female with past medical history of bipolar disorder, depression, hypertension, CVA, alcohol dependence who presents to the ED for headache, left-sided numbness and weakness.  As symptoms recurred 1 hour prior to arrival a code stroke was initiated on this patient.   Differential includes CVA, dissection, complex migraine.  CT, CTA, CT perfusion study ordered and without emergent changes.  Symptoms were improving and then recurred with vision loss in the right eye and worsening of headache, given Compazine, Benadryl, fluids with improvement in  left-sided weakness but persistent right-sided vision changes.  Neurology Dr. Cheral Marker recommends MRI and admission for stroke work-up.   Final Clinical Impression(s) / ED Diagnoses Final diagnoses:  Left-sided weakness  Acute nonintractable headache, unspecified headache type    Rx / DC Orders ED Discharge Orders    None       Bijal Siglin, [place], MD 04/09/19 2346    Sherwood Gambler, MD 04/10/19 1735

## 2019-04-09 NOTE — ED Notes (Signed)
CRITICAL LAB - Hemoglobin 6.7 and Platelet 1072. Regenia Skeeter MD notified.

## 2019-04-09 NOTE — ED Triage Notes (Signed)
Pt arrives via [facility] from home.   At 1500 today pt experienced numbness to left side of body accompanied by a headache and some slurred speach. Pt has Todd's disease, and also a hx of strokes.   Upon arrival pt endorses left side weakness and headache.

## 2019-04-10 ENCOUNTER — Observation Stay (HOSPITAL_COMMUNITY): Payer: No Typology Code available for payment source

## 2019-04-10 ENCOUNTER — Encounter (HOSPITAL_COMMUNITY): Payer: Self-pay | Admitting: Family Medicine

## 2019-04-10 ENCOUNTER — Other Ambulatory Visit: Payer: Self-pay

## 2019-04-10 DIAGNOSIS — F319 Bipolar disorder, unspecified: Secondary | ICD-10-CM | POA: Diagnosis not present

## 2019-04-10 DIAGNOSIS — Z7982 Long term (current) use of aspirin: Secondary | ICD-10-CM | POA: Diagnosis not present

## 2019-04-10 DIAGNOSIS — E876 Hypokalemia: Secondary | ICD-10-CM | POA: Diagnosis present

## 2019-04-10 DIAGNOSIS — D509 Iron deficiency anemia, unspecified: Secondary | ICD-10-CM | POA: Diagnosis not present

## 2019-04-10 DIAGNOSIS — K5900 Constipation, unspecified: Secondary | ICD-10-CM | POA: Diagnosis present

## 2019-04-10 DIAGNOSIS — G43709 Chronic migraine without aura, not intractable, without status migrainosus: Secondary | ICD-10-CM | POA: Diagnosis not present

## 2019-04-10 DIAGNOSIS — I63511 Cerebral infarction due to unspecified occlusion or stenosis of right middle cerebral artery: Secondary | ICD-10-CM

## 2019-04-10 DIAGNOSIS — Z6830 Body mass index (BMI) 30.0-30.9, adult: Secondary | ICD-10-CM | POA: Diagnosis not present

## 2019-04-10 DIAGNOSIS — N920 Excessive and frequent menstruation with regular cycle: Secondary | ICD-10-CM | POA: Diagnosis present

## 2019-04-10 DIAGNOSIS — I10 Essential (primary) hypertension: Secondary | ICD-10-CM

## 2019-04-10 DIAGNOSIS — Z79899 Other long term (current) drug therapy: Secondary | ICD-10-CM | POA: Diagnosis not present

## 2019-04-10 DIAGNOSIS — G8194 Hemiplegia, unspecified affecting left nondominant side: Secondary | ICD-10-CM | POA: Diagnosis present

## 2019-04-10 DIAGNOSIS — I82409 Acute embolism and thrombosis of unspecified deep veins of unspecified lower extremity: Secondary | ICD-10-CM

## 2019-04-10 DIAGNOSIS — G4733 Obstructive sleep apnea (adult) (pediatric): Secondary | ICD-10-CM | POA: Diagnosis not present

## 2019-04-10 DIAGNOSIS — R519 Headache, unspecified: Secondary | ICD-10-CM

## 2019-04-10 DIAGNOSIS — K219 Gastro-esophageal reflux disease without esophagitis: Secondary | ICD-10-CM | POA: Diagnosis present

## 2019-04-10 DIAGNOSIS — R531 Weakness: Secondary | ICD-10-CM

## 2019-04-10 DIAGNOSIS — E538 Deficiency of other specified B group vitamins: Secondary | ICD-10-CM | POA: Diagnosis not present

## 2019-04-10 DIAGNOSIS — D473 Essential (hemorrhagic) thrombocythemia: Secondary | ICD-10-CM

## 2019-04-10 DIAGNOSIS — Z8673 Personal history of transient ischemic attack (TIA), and cerebral infarction without residual deficits: Secondary | ICD-10-CM | POA: Diagnosis not present

## 2019-04-10 DIAGNOSIS — I63411 Cerebral infarction due to embolism of right middle cerebral artery: Secondary | ICD-10-CM | POA: Diagnosis present

## 2019-04-10 DIAGNOSIS — D259 Leiomyoma of uterus, unspecified: Secondary | ICD-10-CM | POA: Diagnosis present

## 2019-04-10 DIAGNOSIS — R29701 NIHSS score 1: Secondary | ICD-10-CM | POA: Diagnosis present

## 2019-04-10 DIAGNOSIS — R2990 Unspecified symptoms and signs involving the nervous system: Secondary | ICD-10-CM | POA: Diagnosis present

## 2019-04-10 DIAGNOSIS — E669 Obesity, unspecified: Secondary | ICD-10-CM | POA: Diagnosis present

## 2019-04-10 DIAGNOSIS — Z823 Family history of stroke: Secondary | ICD-10-CM | POA: Diagnosis not present

## 2019-04-10 DIAGNOSIS — D72829 Elevated white blood cell count, unspecified: Secondary | ICD-10-CM | POA: Diagnosis present

## 2019-04-10 DIAGNOSIS — G43909 Migraine, unspecified, not intractable, without status migrainosus: Secondary | ICD-10-CM | POA: Diagnosis present

## 2019-04-10 DIAGNOSIS — Z8616 Personal history of COVID-19: Secondary | ICD-10-CM | POA: Diagnosis not present

## 2019-04-10 LAB — BPAM RBC
Blood Product Expiration Date: 202103232359
ISSUE DATE / TIME: 202102182248
Unit Type and Rh: 7300

## 2019-04-10 LAB — IRON AND TIBC
Iron: 16 ug/dL — ABNORMAL LOW (ref 28–170)
Saturation Ratios: 3 % — ABNORMAL LOW (ref 10.4–31.8)
TIBC: 550 ug/dL — ABNORMAL HIGH (ref 250–450)
UIBC: 534 ug/dL

## 2019-04-10 LAB — TYPE AND SCREEN
ABO/RH(D): B POS
Antibody Screen: NEGATIVE
Unit division: 0

## 2019-04-10 LAB — HEMOGLOBIN A1C
Hgb A1c MFr Bld: 5.5 % (ref 4.8–5.6)
Mean Plasma Glucose: 111.15 mg/dL

## 2019-04-10 LAB — CBC WITH DIFFERENTIAL/PLATELET
Abs Immature Granulocytes: 0 10*3/uL (ref 0.00–0.07)
Basophils Absolute: 0 10*3/uL (ref 0.0–0.1)
Basophils Relative: 0 %
Eosinophils Absolute: 0 10*3/uL (ref 0.0–0.5)
Eosinophils Relative: 0 %
HCT: 28.4 % — ABNORMAL LOW (ref 36.0–46.0)
Hemoglobin: 7.9 g/dL — ABNORMAL LOW (ref 12.0–15.0)
Lymphocytes Relative: 16 %
Lymphs Abs: 2.2 10*3/uL (ref 0.7–4.0)
MCH: 18.2 pg — ABNORMAL LOW (ref 26.0–34.0)
MCHC: 27.8 g/dL — ABNORMAL LOW (ref 30.0–36.0)
MCV: 65.3 fL — ABNORMAL LOW (ref 80.0–100.0)
Monocytes Absolute: 0.4 10*3/uL (ref 0.1–1.0)
Monocytes Relative: 3 %
Neutro Abs: 10.9 10*3/uL — ABNORMAL HIGH (ref 1.7–7.7)
Neutrophils Relative %: 81 %
Platelets: 956 10*3/uL (ref 150–400)
RBC: 4.35 MIL/uL (ref 3.87–5.11)
RDW: 28.8 % — ABNORMAL HIGH (ref 11.5–15.5)
WBC: 13.5 10*3/uL — ABNORMAL HIGH (ref 4.0–10.5)
nRBC: 0 % (ref 0.0–0.2)
nRBC: 0 /100 WBC

## 2019-04-10 LAB — VITAMIN B12: Vitamin B-12: 181 pg/mL (ref 180–914)

## 2019-04-10 LAB — SEDIMENTATION RATE
Sed Rate: 4 mm/hr (ref 0–22)
Sed Rate: 3 mm/hr (ref 0–22)

## 2019-04-10 LAB — FERRITIN: Ferritin: 2 ng/mL — ABNORMAL LOW (ref 11–307)

## 2019-04-10 LAB — LIPID PANEL
Cholesterol: 76 mg/dL (ref 0–200)
HDL: 33 mg/dL — ABNORMAL LOW (ref >40)
LDL Cholesterol: 27 mg/dL (ref 0–99)
Total CHOL/HDL Ratio: 2.3 RATIO
Triglycerides: 78 mg/dL (ref <150)
VLDL: 16 mg/dL (ref 0–40)

## 2019-04-10 LAB — C-REACTIVE PROTEIN: CRP: 0.5 mg/dL (ref <1.0)

## 2019-04-10 LAB — PATHOLOGIST SMEAR REVIEW

## 2019-04-10 LAB — FOLATE: Folate: 4.3 ng/mL — ABNORMAL LOW (ref >5.9)

## 2019-04-10 MED ORDER — STROKE: EARLY STAGES OF RECOVERY BOOK: Freq: Once | Status: DC

## 2019-04-10 MED ORDER — ASPIRIN EC 81 MG PO TBEC
81.0000 mg | DELAYED_RELEASE_TABLET | Freq: Every day | ORAL | Status: DC
  Administered 2019-04-11 – 2019-04-12 (×2): 81 mg via ORAL
  Filled 2019-04-10 (×2): qty 1

## 2019-04-10 MED ORDER — ENOXAPARIN SODIUM 40 MG/0.4ML ~~LOC~~ SOLN
40.0000 mg | SUBCUTANEOUS | Status: DC
  Administered 2019-04-10 – 2019-04-12 (×3): 40 mg via SUBCUTANEOUS
  Filled 2019-04-10 (×3): qty 0.4

## 2019-04-10 MED ORDER — SENNOSIDES-DOCUSATE SODIUM 8.6-50 MG PO TABS
1.0000 | ORAL_TABLET | Freq: Two times a day (BID) | ORAL | Status: DC
  Administered 2019-04-10 – 2019-04-12 (×4): 1 via ORAL
  Filled 2019-04-10 (×4): qty 1

## 2019-04-10 MED ORDER — FOLIC ACID 1 MG PO TABS
1.0000 mg | ORAL_TABLET | Freq: Every day | ORAL | Status: DC
  Administered 2019-04-10 – 2019-04-12 (×3): 1 mg via ORAL
  Filled 2019-04-10 (×3): qty 1

## 2019-04-10 MED ORDER — ACETAMINOPHEN 325 MG PO TABS: 650.0000 mg | ORAL_TABLET | ORAL | Status: DC | PRN

## 2019-04-10 MED ORDER — ACETAMINOPHEN 650 MG RE SUPP: 650.0000 mg | RECTAL | Status: DC | PRN

## 2019-04-10 MED ORDER — SENNOSIDES-DOCUSATE SODIUM 8.6-50 MG PO TABS: 1.0000 | ORAL_TABLET | Freq: Every evening | ORAL | Status: DC | PRN

## 2019-04-10 MED ORDER — PROPRANOLOL HCL 20 MG PO TABS
20.0000 mg | ORAL_TABLET | Freq: Two times a day (BID) | ORAL | Status: DC
  Administered 2019-04-10 – 2019-04-12 (×4): 20 mg via ORAL
  Filled 2019-04-10 (×5): qty 1

## 2019-04-10 MED ORDER — ACETAMINOPHEN 160 MG/5ML PO SOLN: 650.0000 mg | ORAL | Status: DC | PRN

## 2019-04-10 MED ORDER — PROPRANOLOL HCL 20 MG PO TABS
20.0000 mg | ORAL_TABLET | Freq: Two times a day (BID) | ORAL | Status: DC
  Filled 2019-04-10: qty 1

## 2019-04-10 MED ORDER — LABETALOL HCL 5 MG/ML IV SOLN: 10.0000 mg | INTRAVENOUS | Status: DC | PRN

## 2019-04-10 MED ORDER — SODIUM CHLORIDE 0.9 % IV SOLN: INTRAVENOUS | Status: DC

## 2019-04-10 NOTE — Progress Notes (Signed)
STROKE TEAM PROGRESS NOTE   INTERVAL HISTORY I personally reviewed history of presenting illness with the patient, electronic medical records and imaging films in PACS.  She presented with headache and left-sided weakness and numbness.  She states she has history of migraines and has had similar headaches in the past and felt her current headache was like a migraine.  However MRI scan of the brain shows patchy right MCA infarct.  CT angiogram of the brain was unremarkable.  LDL cholesterol 27 mg percent.  Hemoglobin A1c is 5.5.  Urine drug screen is negative.   I personally performed vascular Doppler bubble study at the bedside which was negative for PFO.  Review of record shows that she had TEE done on 11/13/2017 which was normal.   She was admitted on 12/27/2018 to the stroke service for strokelike episode and received IV TPA and made full recovery and MRI was negative for stroke. She also has a prior history of right-sided stroke and September 2019 secondary to possible right carotid dissection or ruptured plaque with mild residual left-sided weakness.  She also has diagnosis of thrombocytosis and migraine headaches  Vitals:   04/10/19 0130 04/10/19 0300 04/10/19 0350 04/10/19 0622  BP: (!) 174/106 (!) 155/89 (!) 173/75 (!) 154/91  Pulse: 70 70 67 68  Resp: (!) 28  19 11   Temp:      TempSrc:      SpO2: 99% 100% 100% 100%    CBC:  Recent Labs  Lab 04/09/19 1954 04/09/19 1954 04/09/19 2020 04/10/19 0400  WBC 13.1*  --   --  13.5*  NEUTROABS 10.9*  --   --  10.9*  HGB 6.7*   < > 9.2* 7.9*  HCT 25.7*   < > 27.0* 28.4*  MCV 62.1*  --   --  65.3*  PLT 1,072*  --   --  956*   < > = values in this interval not displayed.    Basic Metabolic Panel:  Recent Labs  Lab 04/09/19 1954 04/09/19 2020  NA 138 139  K 4.0 3.9  CL 107 107  CO2 23  --   GLUCOSE 113* 111*  BUN 6 7  CREATININE 0.93 0.70  CALCIUM 9.0  --    Lipid Panel:     Component Value Date/Time   CHOL 76 04/10/2019  0400   TRIG 78 04/10/2019 0400   HDL 33 (L) 04/10/2019 0400   CHOLHDL 2.3 04/10/2019 0400   VLDL 16 04/10/2019 0400   LDLCALC 27 04/10/2019 0400   HgbA1c:  Lab Results  Component Value Date   HGBA1C 5.5 04/10/2019   Urine Drug Screen:     Component Value Date/Time   LABOPIA NONE DETECTED 04/09/2019 2005   COCAINSCRNUR NONE DETECTED 04/09/2019 2005   LABBENZ NONE DETECTED 04/09/2019 2005   AMPHETMU NONE DETECTED 04/09/2019 2005   THCU NONE DETECTED 04/09/2019 2005   LABBARB NONE DETECTED 04/09/2019 2005    Alcohol Level     Component Value Date/Time   ETH <10 04/09/2019 1954    IMAGING past 48 hours CT Code Stroke CTA Head W/WO contrast  Result Date: 04/09/2019 CLINICAL DATA:  44 year old female with left side numbness. 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 EXAM: CT ANGIOGRAPHY HEAD AND NECK CT PERFUSION BRAIN TECHNIQUE: Multidetector CT imaging of the head and neck was performed using the standard protocol during bolus administration of intravenous contrast. Multiplanar CT image reconstructions  and MIPs were obtained to evaluate the vascular anatomy. Carotid stenosis measurements (when applicable) are obtained utilizing NASCET criteria, using the distal internal carotid diameter as the denominator. Multiphase CT imaging of the brain was performed following IV bolus contrast injection. Subsequent parametric perfusion maps were calculated using RAPID software. CONTRAST:  182mL OMNIPAQUE IOHEXOL 350 MG/ML SOLN COMPARISON:  Plain head CT 2033 hours today. FINDINGS: CT Brain Perfusion Findings: ASPECTS: 10 CBF (<30%) Volume: None Perfusion (Tmax>6.0s) volume: None Mismatch Volume: Not applicable Infarction Location:Not applicable CTA NECK Skeleton: No acute osseous abnormality identified. Upper chest: Negative. Other neck: Negative. Aortic arch: 3 vessel arch configuration with no arch or proximal great vessel  atherosclerosis. Right carotid system: Negative right CCA. At the right carotid bifurcation there is a subtle persistent small round hypodense area at the posterior bulb measuring about 3 mm (series 7, image 86). This appears unchanged since November. Otherwise the right bifurcation is widely patent. Negative cervical right ICA otherwise, mild tortuosity distal to the bulb. Left carotid system: Stable and negative aside from tortuosity. Vertebral arteries: Proximal right subclavian artery and right vertebral artery origin are normal. The right vertebral artery is patent to the skull base with tortuosity but no plaque or stenosis. Proximal left subclavian artery and left vertebral artery origin are normal aside from tortuosity. The left vertebral appears mildly dominant, with V2 segment tortuosity but no plaque or stenosis to the skull base. CTA HEAD Posterior circulation: Tortuous distal vertebral arteries with no plaque or stenosis. Patent PICA origins and vertebrobasilar junction. Tortuous basilar artery is patent without stenosis. Basilar tip, SCA and PCA origins appear stable and within normal limits. Posterior communicating arteries are diminutive or absent. Bilateral PCA branches are stable and within normal limits. Anterior circulation: Both ICA siphons are patent. There is tortuosity and mild irregularity of the left siphon with no discrete plaque or stenosis. This is stable. Right siphon is tortuous without plaque or stenosis. Patent carotid termini. MCA and ACA origins remain normal. Mildly dominant right A1 segment. Tortuous A1s. Anterior communicating artery and bilateral ACA branches are within normal limits. Left MCA M1 segment and bifurcation are patent without stenosis. Left MCA branches are stable. Right MCA M1 segment and bifurcation are patent without stenosis. Right MCA branches are stable. Venous sinuses: Stable patency since November. Dominant appearing right transverse and sigmoid sinuses,  with some effacement of those bilateral sinuses. Anatomic variants: Mildly dominant left vertebral artery, right ACA A1 segment. Review of the MIP images confirms the above findings IMPRESSION: 1. Negative for emergent large vessel occlusion, negative CTP, and stable CTA appearance of the head and neck since November. 2. Unchanged small 3 mm focus of hypodensity in the right ICA bulb, now favored to be a small web or chronic soft plaque rather than adherent thrombus. 3. No other atherosclerosis identified. No significant arterial stenosis. These results were communicated to Dr. Cheral Marker at 10:20 pm on 04/09/2019 by text page via the [facility] messaging system. Electronically Signed   By: Genevie Ann M.D.   On: 04/09/2019 22:24   CT Code Stroke CTA Neck W/WO contrast  Result Date: 04/09/2019 CLINICAL DATA:  44 year old female with left side numbness. 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 EXAM: CT ANGIOGRAPHY HEAD AND NECK CT PERFUSION BRAIN TECHNIQUE: Multidetector CT imaging of the head and neck was performed using the standard protocol during bolus administration of intravenous contrast. Multiplanar CT image reconstructions and MIPs were  obtained to evaluate the vascular anatomy. Carotid stenosis measurements (when applicable) are obtained utilizing NASCET criteria, using the distal internal carotid diameter as the denominator. Multiphase CT imaging of the brain was performed following IV bolus contrast injection. Subsequent parametric perfusion maps were calculated using RAPID software. CONTRAST:  134mL OMNIPAQUE IOHEXOL 350 MG/ML SOLN COMPARISON:  Plain head CT 2033 hours today. FINDINGS: CT Brain Perfusion Findings: ASPECTS: 10 CBF (<30%) Volume: None Perfusion (Tmax>6.0s) volume: None Mismatch Volume: Not applicable Infarction Location:Not applicable CTA NECK Skeleton: No acute osseous abnormality identified. Upper chest: Negative. Other  neck: Negative. Aortic arch: 3 vessel arch configuration with no arch or proximal great vessel atherosclerosis. Right carotid system: Negative right CCA. At the right carotid bifurcation there is a subtle persistent small round hypodense area at the posterior bulb measuring about 3 mm (series 7, image 86). This appears unchanged since November. Otherwise the right bifurcation is widely patent. Negative cervical right ICA otherwise, mild tortuosity distal to the bulb. Left carotid system: Stable and negative aside from tortuosity. Vertebral arteries: Proximal right subclavian artery and right vertebral artery origin are normal. The right vertebral artery is patent to the skull base with tortuosity but no plaque or stenosis. Proximal left subclavian artery and left vertebral artery origin are normal aside from tortuosity. The left vertebral appears mildly dominant, with V2 segment tortuosity but no plaque or stenosis to the skull base. CTA HEAD Posterior circulation: Tortuous distal vertebral arteries with no plaque or stenosis. Patent PICA origins and vertebrobasilar junction. Tortuous basilar artery is patent without stenosis. Basilar tip, SCA and PCA origins appear stable and within normal limits. Posterior communicating arteries are diminutive or absent. Bilateral PCA branches are stable and within normal limits. Anterior circulation: Both ICA siphons are patent. There is tortuosity and mild irregularity of the left siphon with no discrete plaque or stenosis. This is stable. Right siphon is tortuous without plaque or stenosis. Patent carotid termini. MCA and ACA origins remain normal. Mildly dominant right A1 segment. Tortuous A1s. Anterior communicating artery and bilateral ACA branches are within normal limits. Left MCA M1 segment and bifurcation are patent without stenosis. Left MCA branches are stable. Right MCA M1 segment and bifurcation are patent without stenosis. Right MCA branches are stable. Venous  sinuses: Stable patency since November. Dominant appearing right transverse and sigmoid sinuses, with some effacement of those bilateral sinuses. Anatomic variants: Mildly dominant left vertebral artery, right ACA A1 segment. Review of the MIP images confirms the above findings IMPRESSION: 1. Negative for emergent large vessel occlusion, negative CTP, and stable CTA appearance of the head and neck since November. 2. Unchanged small 3 mm focus of hypodensity in the right ICA bulb, now favored to be a small web or chronic soft plaque rather than adherent thrombus. 3. No other atherosclerosis identified. No significant arterial stenosis. These results were communicated to Dr. Cheral Marker at 10:20 pm on 04/09/2019 by text page via the [facility] messaging system. Electronically Signed   By: Genevie Ann M.D.   On: 04/09/2019 22:24   MR BRAIN WO CONTRAST  Result Date: 04/10/2019 CLINICAL DATA:  Left-sided numbness and weakness EXAM: MRI HEAD WITHOUT CONTRAST TECHNIQUE: Multiplanar, multiecho pulse sequences of the brain and surrounding structures were obtained without intravenous contrast. COMPARISON:  Brain MRI 12/27/2018 FINDINGS: BRAIN: There are scattered punctate foci of abnormal diffusion restriction within the right MCA territory. No contralateral diffusion abnormality. There is an old posterior right parietal infarct. Multifocal white matter hyperintensity, most commonly due to chronic ischemic  microangiopathy. Normal volume of brain parenchyma and CSF spaces. Midline structures are normal. VASCULAR: Major flow voids are preserved. Unchanged hemosiderin deposition within the posterior right hemisphere. SKULL AND UPPER CERVICAL SPINE: Normal calvarium and skull base. Visualized upper cervical spine and soft tissues are normal. SINUSES/ORBITS: No fluid levels or advanced mucosal thickening. No mastoid or middle ear effusion. Normal orbits. IMPRESSION: 1. Multiple punctate foci of acute ischemia within the right MCA territory.  No hemorrhage or mass effect. 2. Old posterior right parietal infarct and findings of mild chronic ischemic microangiopathy. Electronically Signed   By: Ulyses Jarred M.D.   On: 04/10/2019 02:43   CT Code Stroke Cerebral Perfusion with contrast  Result Date: 04/09/2019 CLINICAL DATA:  44 year old female with left side numbness. 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 EXAM: CT ANGIOGRAPHY HEAD AND NECK CT PERFUSION BRAIN TECHNIQUE: Multidetector CT imaging of the head and neck was performed using the standard protocol during bolus administration of intravenous contrast. Multiplanar CT image reconstructions and MIPs were obtained to evaluate the vascular anatomy. Carotid stenosis measurements (when applicable) are obtained utilizing NASCET criteria, using the distal internal carotid diameter as the denominator. Multiphase CT imaging of the brain was performed following IV bolus contrast injection. Subsequent parametric perfusion maps were calculated using RAPID software. CONTRAST:  131mL OMNIPAQUE IOHEXOL 350 MG/ML SOLN COMPARISON:  Plain head CT 2033 hours today. FINDINGS: CT Brain Perfusion Findings: ASPECTS: 10 CBF (<30%) Volume: None Perfusion (Tmax>6.0s) volume: None Mismatch Volume: Not applicable Infarction Location:Not applicable CTA NECK Skeleton: No acute osseous abnormality identified. Upper chest: Negative. Other neck: Negative. Aortic arch: 3 vessel arch configuration with no arch or proximal great vessel atherosclerosis. Right carotid system: Negative right CCA. At the right carotid bifurcation there is a subtle persistent small round hypodense area at the posterior bulb measuring about 3 mm (series 7, image 86). This appears unchanged since November. Otherwise the right bifurcation is widely patent. Negative cervical right ICA otherwise, mild tortuosity distal to the bulb. Left carotid system: Stable and negative aside  from tortuosity. Vertebral arteries: Proximal right subclavian artery and right vertebral artery origin are normal. The right vertebral artery is patent to the skull base with tortuosity but no plaque or stenosis. Proximal left subclavian artery and left vertebral artery origin are normal aside from tortuosity. The left vertebral appears mildly dominant, with V2 segment tortuosity but no plaque or stenosis to the skull base. CTA HEAD Posterior circulation: Tortuous distal vertebral arteries with no plaque or stenosis. Patent PICA origins and vertebrobasilar junction. Tortuous basilar artery is patent without stenosis. Basilar tip, SCA and PCA origins appear stable and within normal limits. Posterior communicating arteries are diminutive or absent. Bilateral PCA branches are stable and within normal limits. Anterior circulation: Both ICA siphons are patent. There is tortuosity and mild irregularity of the left siphon with no discrete plaque or stenosis. This is stable. Right siphon is tortuous without plaque or stenosis. Patent carotid termini. MCA and ACA origins remain normal. Mildly dominant right A1 segment. Tortuous A1s. Anterior communicating artery and bilateral ACA branches are within normal limits. Left MCA M1 segment and bifurcation are patent without stenosis. Left MCA branches are stable. Right MCA M1 segment and bifurcation are patent without stenosis. Right MCA branches are stable. Venous sinuses: Stable patency since November. Dominant appearing right transverse and sigmoid sinuses, with some effacement of those bilateral sinuses. Anatomic variants: Mildly dominant left vertebral artery, right  ACA A1 segment. Review of the MIP images confirms the above findings IMPRESSION: 1. Negative for emergent large vessel occlusion, negative CTP, and stable CTA appearance of the head and neck since November. 2. Unchanged small 3 mm focus of hypodensity in the right ICA bulb, now favored to be a small web or  chronic soft plaque rather than adherent thrombus. 3. No other atherosclerosis identified. No significant arterial stenosis. These results were communicated to Dr. Cheral Marker at 10:20 pm on 04/09/2019 by text page via the [facility] messaging system. Electronically Signed   By: Genevie Ann M.D.   On: 04/09/2019 22:24   CT HEAD CODE STROKE WO CONTRAST  Result Date: 04/09/2019 CLINICAL DATA:  Code stroke. 44 year old female with left side numbness. 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 EXAM: CT HEAD WITHOUT CONTRAST TECHNIQUE: Contiguous axial images were obtained from the base of the skull through the vertex without intravenous contrast. COMPARISON:  Head CT without contrast 12/28/2018. Brain MRI and intracranial MRA, CTA head and neck 12/27/2018. 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 No midline shift, ventriculomegaly, mass effect, evidence of mass lesion, intracranial hemorrhage or evidence of cortically based acute infarction. Gray-white matter differentiation in the left hemisphere and posterior fossa remains normal. Vascular: No suspicious intracranial vascular hyperdensity. Skull: No acute osseous abnormality identified. Sinuses/Orbits: Visualized paranasal sinuses and mastoids are stable and well pneumatized. Other: Visualized orbits and scalp soft tissues are within normal limits. ASPECTS [facility] Stroke Program Early CT Score) Total score (0-10 with 10 being normal): 10 IMPRESSION: 1. No acute cortically based infarct or acute intracranial hemorrhage identified. 2. 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 ASPECTS 10. 3. These results were communicated to Dr. Cheral Marker at 8:45 pm on 04/09/2019 by text page via the [facility] messaging system. Electronically Signed   By: Genevie Ann M.D.   On: 04/09/2019 20:45    PHYSICAL  EXAM Obese middle-aged African-American lady not in distress. . Afebrile. Head is nontraumatic. Neck is supple without bruit.    Cardiac exam no murmur or gallop. Lungs are clear to auscultation. Distal pulses are well felt. Neurological Exam : She is awake alert oriented to time place and person.  Speech and language appear normal.  Extraocular movements are full range without nystagmus.  Visual acuity and fields appear normal.  Subtle left lower facial asymmetry when she smiles.  Tongue midline.  Motor system exam shows no upper or lower extremity drift but mild diminished fine finger movements on the left and orbits right over left upper extremity.  Slight weakness of left hip flexors and ankle dorsiflexors on double simultaneous testing only.  Sensation is intact.  Coordination is accurate.  Gait not tested.  NIH stroke scale 1( facial asymmetry).  Premorbid modified Rankin scale 0  ASSESSMENT/PLAN [name] is a 44 y.o. female with history of depression, bipolar disorder, CVA, HTN and EtOH dependence presenting with left sided sensory numbness with worsened left sided weakness relative to her baseline impairment.   Stroke:   Multiple punctate R MCA territory infarcts embolic secondary to unknown source in pt w/ prior CVA, stroke family hx stroke in young and COVID in Dec  Code Stroke CT head No acute hemorrhage. Scattered R MCA encephalomalacia stable. ASPECTS 10.     CTA head & neck no ELVO. Unchanged R ICA bulb  web vs plaque. No atherosclerosis.   CT perfusion negative  MRI  Multiple punctate R MCA territory infarcts. Old R parietal infarct. Small vessel disease.   LE Doppler  Neg   2D Echo 12/2018 EF 60-65%. No source of embolus   TCD bubble negative   LDL 27  HgbA1c 5.5  Lovenox 40 mg sq daily for VTE prophylaxis  aspirin 81 mg daily prior to admission, now on aspirin 81 mg daily. No additional plavix given anemia requiring transfusion  Therapy recommendations:   pending   Disposition:  pending   Hypertension  Stable . Permissive hypertension (OK if < 220/120) but gradually normalize in 5-7 days . Long-term BP goal normotensive  Other Stroke Risk Factors  ETOH use, alcohol level <10, advised to drink no more than 1 drink(s) a day  Obesity, There is no height or weight on file to calculate BMI., recommend weight loss, diet and exercise as appropriate   Hx stroke/TIA  12/2018 - Suspected stroke treated with tPA. MRI negative. Episodes of left-sided weakness and sensory changes may be complicated migraine episodes CTA shows possible residual vs new thrombus or carotid web.   Family hx stroke (father, maternal aunt)  Chronic Migraines Likely Obstructive sleep apnea -patient interested in participating in Sleep Smart study Other Active Problems  Bipolar I d/o  Microcytic anemia, thrombocytosis - Hgb 6.5 - transfusion in ED - - present in Nov  Leukocytosis 13.1-13.5 - present in Nov  recent COVID-19 12.5.2020 symptomatic w/ fevers and respiratory sx  Hospital day # 0  I have personally obtained history,examined this patient, reviewed notes, independently viewed imaging studies, participated in medical decision making and plan of care.ROS completed by me personally and pertinent positives fully documented  I have made any additions or clarifications directly to the above note.. She presented with migraine headaches with squiggly line followed by left-sided weakness and numbness but MRI does show patchy embolic-looking right MCA infarcts.  She has had previous strokelike presentations with negative stroke work-up including TEE and Doppler studies.  Etiology of her stroke is likely migrainous infarction versus cryptogenic.  She also has essential thrombocytosis unclear if this is contributing to her stroke as well as recent Covid infection. She is at high risk for obstructive sleep apnea and is interested in participating in the sleep smart stroke  prevention trial.  She will be given information to review and decide.  Discussed with Dr. Grandville Silos.  Greater than 50% time during this 35-minute visit was spent on counseling and coordination of care about her embolic stroke and discussion about evaluation, prevention, treatment and discussion with care team Antony Contras, MD Medical Director [place] Pager: [phone] 04/10/2019 6:32 PM   To contact Stroke Continuity provider, please refer to [email or web address] After hours, contact General Neurology

## 2019-04-10 NOTE — Progress Notes (Signed)
PROGRESS NOTE    Mallory Robinson  KXF:[id number] DOB: [birth year] DOA: 04/09/2019 PCP: Nolene Ebbs, MD   Brief Narrative:  HPI per Dr. Leron Croak is a 44 y.o. female with medical history significant for bipolar disorder, migraines, menorrhagia, hypertension, and stroke with left-sided weakness, now presenting to the ED for evaluation of left-sided numbness and weakness as well as visual disturbance involving the right eye.  She had been in her usual state of health until approximately 3 PM when she experienced worsening in her chronic left-sided weakness, left-sided numbness, and slurred speech.  This was associated with dull ache behind the right eye and zigzag lines that resolved when she closed her right eye.  She notes that the symptoms are very similar to what she has experienced previously.  There was no preceding trauma.  She developed fevers, chills, and respiratory symptoms in early December, was diagnosed with COVID-19, and reports making a full recovery from that.  She continues to experience menorrhagia, saw OB/GYN for this, was diagnosed with uterine fibroids, and is considering surgery for this.  She had been on iron supplementation but stopped taking her iron pills regularly due to constipation.  ED Course: Upon arrival to the ED, patient is found to be afebrile, saturating well on room air, and with blood pressure 163/96.  EKG features sinus rhythm and noncontrast head CT is negative for acute intracranial abnormality but notable for scattered right MCA territory encephalomalacia that appears stable.  Chemistry panel was unremarkable and CBC notable for leukocytosis to 13,100, microcytic anemia with hemoglobin 6.7, and thrombocytosis with platelets 1,072,000.  UDS was negative, urinalysis unremarkable, and ethanol undetectable.  CTA head and neck and CT perfusion study is negative for emergent large vessel occlusion and stable subtle round hypodense area at the right carotid  bifurcation, stable carotid tortuosity, and negative perfusion study.  Patient was given a liter of lactated Ringer's, Benadryl, Compazine, and 1 unit of packed red blood cells in the ED.  Neurology was consulted by the ED physician and recommended MRI brain and medical admission.    Assessment & Plan:   Principal Problem:   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HCC) Active Problems:   Bipolar I disorder (HCC)   Microcytic anemia   Thrombocytosis (HCC)   Chronic migraine   Hypertension  1 acute ischemic right MCA stroke, probably embolic Questionable etiology.  Patient had presented with acute worsening of her left-sided weakness and right eye visual disturbance.  Head CT done on admission with no acute abnormalities.  Stable chronic findings noted on CT angiogram head and neck.  Unchanged small 3 mm focus of hypodensity in the right ICA bulb, now favored to be a small web or chronic soft plaque rather than adherent thrombus noted on CT angiogram head and neck.  Patient improving clinically.  Patient noted to be taking the aspirin chronically at home and not on a statin as recent LDL of 32.  Patient seen in consultation by neurology and stroke work-up underway.  2D echo pending, transcranial Doppler pending, lower extremity Dopplers negative for DVT.  Patient likely needs a TEE to rule out embolic source as MRI with multiple punctuate foci of acute ischemia.  Continue aspirin 81 mg daily for secondary stroke prophylaxis.  Neurology consulted and are following.  2.  Microcytic anemia, thrombocytosis Patient noted to have a hemoglobin of 6.7 on presentation to the ED with MCV of 62.1 and a platelet count of 1072.  Patient noted to have iron  deficiency anemia that she attributed to menorrhagia and is being followed by OB/GYN and diagnosed as uterine fibroids and considering surgery for this.  Patient noted to have evidence of iron deficiency anemia in 2019, iron studies normalized after taking oral iron  supplementations but patient has been off iron supplementations due to constipation.  Patient status post 1 unit packed red blood cells in the ED hemoglobin currently at 7.9.  Thrombocytosis could be secondary to iron deficiency anemia however patient now with acute right MCA CVA likely embolic in nature.  Check an anemia panel.  Check a sed rate, CRP.  Consult with hematology oncology for further evaluation and recommendations concerning thrombocytosis.  Will need a dose of IV Feraheme during this hospitalization and will need to be placed back on oral iron supplementation on discharge and could likely be started on Senokot-S twice daily to prevent constipation.  3.  Chronic migraines Frequency and severity improved on daily propranolol which will resume.  4.  Hypertension Permissive hypertension secondary to problem #1.  Patient noted to just be on propranolol for migraine prophylaxis which we will resume.  5.  Recent COVID-19 Patient had a positive COVID-19 test on 01/24/2019 and experienced fever and respiratory symptoms that have completely resolved.  Per infection prevention department recommends not retesting or instituting special precautions if positive test was > 21 days but < 90 days.  Supportive care.   DVT prophylaxis: Lovenox Code Status: Full Family Communication: Updated patient.  No family at bedside. Disposition Plan:  . Patient came from: Home            . Anticipated d/c place: Home . Barriers to d/c OR conditions which need to be met to effect a safe d/c: Home once stroke work-up is completed and cleared by neurology.   Consultants:   Neurology: Dr. Cheral Marker 04/09/2019  Procedures:   CT angiogram head and neck 04/09/2019  CT head 04/09/2019  Transcranial Doppler pending  Lower extremity Doppler 04/10/2019  2D echo pending  MRI head 04/10/2019  Transfuse 1 unit packed red blood cells in the ED 04/09/2019  Antimicrobials:   None   Subjective: Patient sitting  up in bed.  States left upper extremity strength improving since admission.  Denies any chest pain or shortness of breath.  Feeling better.  Patient denies any overt GI bleed.  Patient states right eye visual disturbance improved.  Objective: Vitals:   04/10/19 0130 04/10/19 0300 04/10/19 0350 04/10/19 0622  BP: (!) 174/106 (!) 155/89 (!) 173/75 (!) 154/91  Pulse: 70 70 67 68  Resp: (!) 28  19 11   Temp:      TempSrc:      SpO2: 99% 100% 100% 100%    Intake/Output Summary (Last 24 hours) at 04/10/2019 5329 Last data filed at 04/10/2019 0124 Gross per 24 hour  Intake 1220 ml  Output --  Net 1220 ml   There were no vitals filed for this visit.  Examination:  General exam: Appears calm and comfortable  Respiratory system: Clear to auscultation. Respiratory effort normal. Cardiovascular system: S1 & S2 heard, RRR. No JVD, murmurs, rubs, gallops or clicks. No pedal edema. Gastrointestinal system: Abdomen is nondistended, soft and nontender. No organomegaly or masses felt. Normal bowel sounds heard. Central nervous system: Alert and oriented.  Cranial nerves II through XII grossly intact.  Sensation intact.  4/5 left upper extremity, 5/5 right upper extremity strength and left lower extremity strength.  Gait not tested secondary to safety. Extremities: 4/5 left upper  extremity, 5/5 right upper extremity strength and left lower extremity strength.  Skin: No rashes, lesions or ulcers Psychiatry: Judgement and insight appear normal. Mood & affect appropriate.     Data Reviewed: I have personally reviewed following labs and imaging studies  CBC: Recent Labs  Lab 04/09/19 1954 04/09/19 2020 04/10/19 0400  WBC 13.1*  --  13.5*  NEUTROABS 10.9*  --  10.9*  HGB 6.7* 9.2* 7.9*  HCT 25.7* 27.0* 28.4*  MCV 62.1*  --  65.3*  PLT 1,072*  --  702*   Basic Metabolic Panel: Recent Labs  Lab 04/09/19 1954 04/09/19 2020  NA 138 139  K 4.0 3.9  CL 107 107  CO2 23  --   GLUCOSE 113*  111*  BUN 6 7  CREATININE 0.93 0.70  CALCIUM 9.0  --    GFR: CrCl cannot be calculated (Unknown ideal weight.). Liver Function Tests: Recent Labs  Lab 04/09/19 1954  AST 20  ALT 12  ALKPHOS 64  BILITOT 0.5  PROT 7.2  ALBUMIN 3.9   No results for input(s): LIPASE, AMYLASE in the last 168 hours. No results for input(s): AMMONIA in the last 168 hours. Coagulation Profile: Recent Labs  Lab 04/09/19 1954  INR 1.2   Cardiac Enzymes: No results for input(s): CKTOTAL, CKMB, CKMBINDEX, TROPONINI in the last 168 hours. BNP (last 3 results) No results for input(s): PROBNP in the last 8760 hours. HbA1C: Recent Labs    04/10/19 0400  HGBA1C 5.5   CBG: No results for input(s): GLUCAP in the last 168 hours. Lipid Profile: Recent Labs    04/10/19 0400  CHOL 76  HDL 33*  LDLCALC 27  TRIG 78  CHOLHDL 2.3   Thyroid Function Tests: No results for input(s): TSH, T4TOTAL, FREET4, T3FREE, THYROIDAB in the last 72 hours. Anemia Panel: No results for input(s): VITAMINB12, FOLATE, FERRITIN, TIBC, IRON, RETICCTPCT in the last 72 hours. Sepsis Labs: No results for input(s): PROCALCITON, LATICACIDVEN in the last 168 hours.  No results found for this or any previous visit (from the past 240 hour(s)).       Radiology Studies: CT Code Stroke CTA Head W/WO contrast  Result Date: 04/09/2019 CLINICAL DATA:  44 year old female with left side numbness. 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 EXAM: CT ANGIOGRAPHY HEAD AND NECK CT PERFUSION BRAIN TECHNIQUE: Multidetector CT imaging of the head and neck was performed using the standard protocol during bolus administration of intravenous contrast. Multiplanar CT image reconstructions and MIPs were obtained to evaluate the vascular anatomy. Carotid stenosis measurements (when applicable) are obtained utilizing NASCET criteria, using the distal internal carotid  diameter as the denominator. Multiphase CT imaging of the brain was performed following IV bolus contrast injection. Subsequent parametric perfusion maps were calculated using RAPID software. CONTRAST:  157m OMNIPAQUE IOHEXOL 350 MG/ML SOLN COMPARISON:  Plain head CT 2033 hours today. FINDINGS: CT Brain Perfusion Findings: ASPECTS: 10 CBF (<30%) Volume: None Perfusion (Tmax>6.0s) volume: None Mismatch Volume: Not applicable Infarction Location:Not applicable CTA NECK Skeleton: No acute osseous abnormality identified. Upper chest: Negative. Other neck: Negative. Aortic arch: 3 vessel arch configuration with no arch or proximal great vessel atherosclerosis. Right carotid system: Negative right CCA. At the right carotid bifurcation there is a subtle persistent small round hypodense area at the posterior bulb measuring about 3 mm (series 7, image 86). This appears unchanged since November. Otherwise the right bifurcation is widely patent. Negative  cervical right ICA otherwise, mild tortuosity distal to the bulb. Left carotid system: Stable and negative aside from tortuosity. Vertebral arteries: Proximal right subclavian artery and right vertebral artery origin are normal. The right vertebral artery is patent to the skull base with tortuosity but no plaque or stenosis. Proximal left subclavian artery and left vertebral artery origin are normal aside from tortuosity. The left vertebral appears mildly dominant, with V2 segment tortuosity but no plaque or stenosis to the skull base. CTA HEAD Posterior circulation: Tortuous distal vertebral arteries with no plaque or stenosis. Patent PICA origins and vertebrobasilar junction. Tortuous basilar artery is patent without stenosis. Basilar tip, SCA and PCA origins appear stable and within normal limits. Posterior communicating arteries are diminutive or absent. Bilateral PCA branches are stable and within normal limits. Anterior circulation: Both ICA siphons are patent. There is  tortuosity and mild irregularity of the left siphon with no discrete plaque or stenosis. This is stable. Right siphon is tortuous without plaque or stenosis. Patent carotid termini. MCA and ACA origins remain normal. Mildly dominant right A1 segment. Tortuous A1s. Anterior communicating artery and bilateral ACA branches are within normal limits. Left MCA M1 segment and bifurcation are patent without stenosis. Left MCA branches are stable. Right MCA M1 segment and bifurcation are patent without stenosis. Right MCA branches are stable. Venous sinuses: Stable patency since November. Dominant appearing right transverse and sigmoid sinuses, with some effacement of those bilateral sinuses. Anatomic variants: Mildly dominant left vertebral artery, right ACA A1 segment. Review of the MIP images confirms the above findings IMPRESSION: 1. Negative for emergent large vessel occlusion, negative CTP, and stable CTA appearance of the head and neck since November. 2. Unchanged small 3 mm focus of hypodensity in the right ICA bulb, now favored to be a small web or chronic soft plaque rather than adherent thrombus. 3. No other atherosclerosis identified. No significant arterial stenosis. These results were communicated to Dr. Cheral Marker at 10:20 pm on 04/09/2019 by text page via the [facility] messaging system. Electronically Signed   By: Genevie Ann M.D.   On: 04/09/2019 22:24   CT Code Stroke CTA Neck W/WO contrast  Result Date: 04/09/2019 CLINICAL DATA:  44 year old female with left side numbness. 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 EXAM: CT ANGIOGRAPHY HEAD AND NECK CT PERFUSION BRAIN TECHNIQUE: Multidetector CT imaging of the head and neck was performed using the standard protocol during bolus administration of intravenous contrast. Multiplanar CT image reconstructions and MIPs were obtained to evaluate the vascular anatomy. Carotid stenosis measurements  (when applicable) are obtained utilizing NASCET criteria, using the distal internal carotid diameter as the denominator. Multiphase CT imaging of the brain was performed following IV bolus contrast injection. Subsequent parametric perfusion maps were calculated using RAPID software. CONTRAST:  11m OMNIPAQUE IOHEXOL 350 MG/ML SOLN COMPARISON:  Plain head CT 2033 hours today. FINDINGS: CT Brain Perfusion Findings: ASPECTS: 10 CBF (<30%) Volume: None Perfusion (Tmax>6.0s) volume: None Mismatch Volume: Not applicable Infarction Location:Not applicable CTA NECK Skeleton: No acute osseous abnormality identified. Upper chest: Negative. Other neck: Negative. Aortic arch: 3 vessel arch configuration with no arch or proximal great vessel atherosclerosis. Right carotid system: Negative right CCA. At the right carotid bifurcation there is a subtle persistent small round hypodense area at the posterior bulb measuring about 3 mm (series 7, image 86). This appears unchanged since November. Otherwise the right bifurcation is widely patent. Negative cervical right ICA  otherwise, mild tortuosity distal to the bulb. Left carotid system: Stable and negative aside from tortuosity. Vertebral arteries: Proximal right subclavian artery and right vertebral artery origin are normal. The right vertebral artery is patent to the skull base with tortuosity but no plaque or stenosis. Proximal left subclavian artery and left vertebral artery origin are normal aside from tortuosity. The left vertebral appears mildly dominant, with V2 segment tortuosity but no plaque or stenosis to the skull base. CTA HEAD Posterior circulation: Tortuous distal vertebral arteries with no plaque or stenosis. Patent PICA origins and vertebrobasilar junction. Tortuous basilar artery is patent without stenosis. Basilar tip, SCA and PCA origins appear stable and within normal limits. Posterior communicating arteries are diminutive or absent. Bilateral PCA branches are  stable and within normal limits. Anterior circulation: Both ICA siphons are patent. There is tortuosity and mild irregularity of the left siphon with no discrete plaque or stenosis. This is stable. Right siphon is tortuous without plaque or stenosis. Patent carotid termini. MCA and ACA origins remain normal. Mildly dominant right A1 segment. Tortuous A1s. Anterior communicating artery and bilateral ACA branches are within normal limits. Left MCA M1 segment and bifurcation are patent without stenosis. Left MCA branches are stable. Right MCA M1 segment and bifurcation are patent without stenosis. Right MCA branches are stable. Venous sinuses: Stable patency since November. Dominant appearing right transverse and sigmoid sinuses, with some effacement of those bilateral sinuses. Anatomic variants: Mildly dominant left vertebral artery, right ACA A1 segment. Review of the MIP images confirms the above findings IMPRESSION: 1. Negative for emergent large vessel occlusion, negative CTP, and stable CTA appearance of the head and neck since November. 2. Unchanged small 3 mm focus of hypodensity in the right ICA bulb, now favored to be a small web or chronic soft plaque rather than adherent thrombus. 3. No other atherosclerosis identified. No significant arterial stenosis. These results were communicated to Dr. Cheral Marker at 10:20 pm on 04/09/2019 by text page via the [facility] messaging system. Electronically Signed   By: Genevie Ann M.D.   On: 04/09/2019 22:24   MR BRAIN WO CONTRAST  Result Date: 04/10/2019 CLINICAL DATA:  Left-sided numbness and weakness EXAM: MRI HEAD WITHOUT CONTRAST TECHNIQUE: Multiplanar, multiecho pulse sequences of the brain and surrounding structures were obtained without intravenous contrast. COMPARISON:  Brain MRI 12/27/2018 FINDINGS: BRAIN: There are scattered punctate foci of abnormal diffusion restriction within the right MCA territory. No contralateral diffusion abnormality. There is an old posterior  right parietal infarct. Multifocal white matter hyperintensity, most commonly due to chronic ischemic microangiopathy. Normal volume of brain parenchyma and CSF spaces. Midline structures are normal. VASCULAR: Major flow voids are preserved. Unchanged hemosiderin deposition within the posterior right hemisphere. SKULL AND UPPER CERVICAL SPINE: Normal calvarium and skull base. Visualized upper cervical spine and soft tissues are normal. SINUSES/ORBITS: No fluid levels or advanced mucosal thickening. No mastoid or middle ear effusion. Normal orbits. IMPRESSION: 1. Multiple punctate foci of acute ischemia within the right MCA territory. No hemorrhage or mass effect. 2. Old posterior right parietal infarct and findings of mild chronic ischemic microangiopathy. Electronically Signed   By: Ulyses Jarred M.D.   On: 04/10/2019 02:43   CT Code Stroke Cerebral Perfusion with contrast  Result Date: 04/09/2019 CLINICAL DATA:  43 year old female with left side numbness. 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 EXAM: CT ANGIOGRAPHY HEAD AND NECK CT PERFUSION BRAIN TECHNIQUE: Multidetector CT imaging  of the head and neck was performed using the standard protocol during bolus administration of intravenous contrast. Multiplanar CT image reconstructions and MIPs were obtained to evaluate the vascular anatomy. Carotid stenosis measurements (when applicable) are obtained utilizing NASCET criteria, using the distal internal carotid diameter as the denominator. Multiphase CT imaging of the brain was performed following IV bolus contrast injection. Subsequent parametric perfusion maps were calculated using RAPID software. CONTRAST:  166m OMNIPAQUE IOHEXOL 350 MG/ML SOLN COMPARISON:  Plain head CT 2033 hours today. FINDINGS: CT Brain Perfusion Findings: ASPECTS: 10 CBF (<30%) Volume: None Perfusion (Tmax>6.0s) volume: None Mismatch Volume: Not applicable  Infarction Location:Not applicable CTA NECK Skeleton: No acute osseous abnormality identified. Upper chest: Negative. Other neck: Negative. Aortic arch: 3 vessel arch configuration with no arch or proximal great vessel atherosclerosis. Right carotid system: Negative right CCA. At the right carotid bifurcation there is a subtle persistent small round hypodense area at the posterior bulb measuring about 3 mm (series 7, image 86). This appears unchanged since November. Otherwise the right bifurcation is widely patent. Negative cervical right ICA otherwise, mild tortuosity distal to the bulb. Left carotid system: Stable and negative aside from tortuosity. Vertebral arteries: Proximal right subclavian artery and right vertebral artery origin are normal. The right vertebral artery is patent to the skull base with tortuosity but no plaque or stenosis. Proximal left subclavian artery and left vertebral artery origin are normal aside from tortuosity. The left vertebral appears mildly dominant, with V2 segment tortuosity but no plaque or stenosis to the skull base. CTA HEAD Posterior circulation: Tortuous distal vertebral arteries with no plaque or stenosis. Patent PICA origins and vertebrobasilar junction. Tortuous basilar artery is patent without stenosis. Basilar tip, SCA and PCA origins appear stable and within normal limits. Posterior communicating arteries are diminutive or absent. Bilateral PCA branches are stable and within normal limits. Anterior circulation: Both ICA siphons are patent. There is tortuosity and mild irregularity of the left siphon with no discrete plaque or stenosis. This is stable. Right siphon is tortuous without plaque or stenosis. Patent carotid termini. MCA and ACA origins remain normal. Mildly dominant right A1 segment. Tortuous A1s. Anterior communicating artery and bilateral ACA branches are within normal limits. Left MCA M1 segment and bifurcation are patent without stenosis. Left MCA  branches are stable. Right MCA M1 segment and bifurcation are patent without stenosis. Right MCA branches are stable. Venous sinuses: Stable patency since November. Dominant appearing right transverse and sigmoid sinuses, with some effacement of those bilateral sinuses. Anatomic variants: Mildly dominant left vertebral artery, right ACA A1 segment. Review of the MIP images confirms the above findings IMPRESSION: 1. Negative for emergent large vessel occlusion, negative CTP, and stable CTA appearance of the head and neck since November. 2. Unchanged small 3 mm focus of hypodensity in the right ICA bulb, now favored to be a small web or chronic soft plaque rather than adherent thrombus. 3. No other atherosclerosis identified. No significant arterial stenosis. These results were communicated to Dr. LCheral Markerat 10:20 pm on 04/09/2019 by text page via the [facility] system. Electronically Signed   By: HGenevie AnnM.D.   On: 04/09/2019 22:24   CT HEAD CODE STROKE WO CONTRAST  Result Date: 04/09/2019 CLINICAL DATA:  Code stroke. 44year old female with left side numbness. 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 EXAM: CT HEAD WITHOUT CONTRAST TECHNIQUE: Contiguous axial images were obtained from the base of the skull  through the vertex without intravenous contrast. COMPARISON:  Head CT without contrast 12/28/2018. Brain MRI and intracranial MRA, CTA head and neck 12/27/2018. 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 No midline shift, ventriculomegaly, mass effect, evidence of mass lesion, intracranial hemorrhage or evidence of cortically based acute infarction. Gray-white matter differentiation in the left hemisphere and posterior fossa remains normal. Vascular: No suspicious intracranial vascular hyperdensity. Skull: No acute osseous abnormality identified.  Sinuses/Orbits: Visualized paranasal sinuses and mastoids are stable and well pneumatized. Other: Visualized orbits and scalp soft tissues are within normal limits. ASPECTS [facility] Stroke Program Early CT Score) Total score (0-10 with 10 being normal): 10 IMPRESSION: 1. No acute cortically based infarct or acute intracranial hemorrhage identified. 2. 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 ASPECTS 10. 3. These results were communicated to Dr. Cheral Marker at 8:45 pm on 04/09/2019 by text page via the [facility] messaging system. Electronically Signed   By: Genevie Ann M.D.   On: 04/09/2019 20:45        Scheduled Meds: .  stroke: mapping our early stages of recovery book   Does not apply Once  . aspirin EC  81 mg Oral Daily  . enoxaparin (LOVENOX) injection  40 mg Subcutaneous Q24H   Continuous Infusions: . sodium chloride    . sodium chloride 75 mL/hr at 04/10/19 0445     LOS: 0 days    Time spent: 40 minutes    Irine Seal, MD [facility]   To contact the attending provider between 7A-7P or the covering provider during after hours 7P-7A, please log into the web site [email or web address] and access using universal [place] password for that web site. If you do not have the password, please call the hospital operator.  04/10/2019, 9:21 AM

## 2019-04-10 NOTE — Evaluation (Signed)
Physical Therapy Evaluation Patient Details Name: Mallory Robinson MRN: [id number] DOB: 07-20-1975 Today's Date: 04/10/2019   History of Present Illness  44 y.o. female with a PMHx of depression, bipolar disorder, CVA, HTN and EtOH dependence who presented to the ED for evaluation of left sided sensory numbness with worsened left sided weakness relative to her baseline impairment secondary to a stroke 2 years ago. The neurological symptoms were accompanied by a right retroorbital headache with some slurred speech. CT head negative for acute abnormality, scattered R MCA encephalomalacia stable. Workup for complicated migraine, vs TIA.  Clinical Impression   Pt presents with R eye peripheral vision deficits and blurring, baseline LUE and LLE weakness secondary to chronic CVA, gait deficits, and decreased activity tolerance vs baseline. Pt to benefit from acute PT to address deficits. Pt ambulated good hallway distance without use of AD and no unsteadiness or LOB, pt with baseline LLE "limp" secondary to chronic weakness. s/s related to admission all resolved, except R eye blurry vision with R eye R upper and lower quadrant visual field noted on exam, as well as limited activity tolerance. No PT follow up warranted at this time, pt's wife and sons to be with pt 24/7 over the next week for pt recovery as needed. PT to progress mobility as tolerated, and will continue to follow acutely.      Follow Up Recommendations No PT follow up;Supervision for mobility/OOB    Equipment Recommendations  None recommended by PT    Recommendations for Other Services       Precautions / Restrictions Precautions Precautions: Fall Precaution Comments: R peripheral vision deficit Restrictions Weight Bearing Restrictions: No      Mobility  Bed Mobility Overal bed mobility: Modified Independent             General bed mobility comments: Increased time to come to and from EOB.  Transfers Overall transfer  level: Needs assistance   Transfers: Sit to/from Stand Sit to Stand: Supervision         General transfer comment: supervision for safety, increased time to rise but presents with good steadiness in standing.  Ambulation/Gait Ambulation/Gait assistance: Supervision Gait Distance (Feet): 200 Feet Assistive device: None Gait Pattern/deviations: Step-through pattern;Decreased stride length;Decreased weight shift to left Gait velocity: decr   General Gait Details: Supervision for safety. Pt with mild decreased weight shift and stance time LLE, which pt states is baseline secondary to chronic CVA. Slow and steady gait.  Stairs            Wheelchair Mobility    Modified Rankin (Stroke Patients Only)       Balance Overall balance assessment: Mild deficits observed, not formally tested                                           Pertinent Vitals/Pain Pain Assessment: No/denies pain    Home Living Family/patient expects to be discharged to:: Private residence Living Arrangements: Spouse/significant other;Children Available Help at Discharge: Family;Available 24 hours/day(over the next week) Type of Home: House Home Access: Level entry     Home Layout: Two level;Bed/bath upstairs Home Equipment: Tub bench;Grab bars - tub/shower;Walker - 2 wheels;Cane - single point      Prior Function Level of Independence: Independent         Comments: 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 Pt reports using cane for outdoor ambulation when there are icy conditions or uneven terrain only. Pt with history of PT, OT, speech therapy.     Hand Dominance   Dominant Hand: Right    Extremity/Trunk Assessment   Upper Extremity Assessment Upper Extremity Assessment: Defer to OT evaluation;LUE deficits/detail LUE Deficits / Details: residual weakness secondary to CVA 2 years ago    Lower Extremity Assessment Lower  Extremity Assessment: LLE deficits/detail;RLE deficits/detail RLE Deficits / Details: MMT - 5/5 hip flexion, hip abd/add, knee flexion, knee extension, DF/PF LLE Deficits / Details: MMT - 3+/5 hip flexion; 4/5 hip abd/add, knee flexion, knee extension, DF/PF LLE Sensation: WNL    Cervical / Trunk Assessment Cervical / Trunk Assessment: Normal  Communication   Communication: No difficulties  Cognition Arousal/Alertness: Awake/alert Behavior During Therapy: WFL for tasks assessed/performed Overall Cognitive Status: Within Functional Limits for tasks assessed                                        General Comments General comments (skin integrity, edema, etc.): Pt reports 0 falls in the past year. s/s related to admission all resolved, except R vision and activity tolerance. Pt with R eye blurry vision, with R eye R upper and lower quadrant visual field noted on exam.    Exercises     Assessment/Plan    PT Assessment Patient needs continued PT services  PT Problem List Decreased mobility;Decreased activity tolerance;Decreased balance       PT Treatment Interventions Therapeutic activities;Gait training;Therapeutic exercise;Patient/family education;Balance training;Stair training;Functional mobility training    PT Goals (Current goals can be found in the Care Plan section)  Acute Rehab PT Goals Patient Stated Goal: go home with family PT Goal Formulation: With patient Time For Goal Achievement: 04/24/19 Potential to Achieve Goals: Good    Frequency Min 3X/week   Barriers to discharge        Co-evaluation               AM-PAC PT "6 Clicks" Mobility  Outcome Measure Help needed turning from your back to your side while in a flat bed without using bedrails?: None Help needed moving from lying on your back to sitting on the side of a flat bed without using bedrails?: None Help needed moving to and from a bed to a chair (including a wheelchair)?:  None Help needed standing up from a chair using your arms (e.g., wheelchair or bedside chair)?: A Little Help needed to walk in hospital room?: A Little Help needed climbing 3-5 steps with a railing? : A Little 6 Click Score: 21    End of Session Equipment Utilized During Treatment: Gait belt Activity Tolerance: Patient tolerated treatment well Patient left: in bed;with call bell/phone within reach Nurse Communication: Mobility status PT Visit Diagnosis: Other abnormalities of gait and mobility (R26.89)    Time: [id number] PT Time Calculation (min) (ACUTE ONLY): 15 min   Charges:   PT Evaluation $PT Eval Low Complexity: 1 Low         Aretta Stetzel E, PT Acute Rehabilitation Services Pager [phone]  Office [phone]  Janeliz Prestwood D Elonda Husky 04/10/2019, 9:34 AM

## 2019-04-10 NOTE — Progress Notes (Signed)
Patient admitted with left side weakness and numbness, alert oriented x 3 denies any pain at this time. Symptoms are resolved and her NIH is 0. Vital signs within normal range. Patient in bed locked at lower level, telephone call light and bedside table within patient reach. Will continue to monitor patient. Report received from [place], [place]

## 2019-04-10 NOTE — Consult Note (Signed)
[place]  [phone] [phone]    [place]  Referring MD: Dr. Irine Seal   Reason for Referral: Thrombocytosis  HPI: Mallory Robinson is a 44 year old female with a past medical history significant for bipolar disorder, migraine headaches, menorrhagia, uterine fibroids, hypertension, history of CVA with resultant left-sided weakness, COVID-19 infection in December 2020.  She presented to the emergency room for evaluation of left-sided numbness and weakness as well as visual disturbance involving the right eye.  She was also noted to have slurred speech.  Symptoms were similar to what she experienced previously when she had a stroke.  She initially had a CT of the head without contrast which showed no acute cortically based infarct or acute intracranial hemorrhage, scattered small areas a right MCA territory encephalomalacia stable.  CT angiogram of the head was negative for emergent large vessel occlusion, negative CT PE, and stable CT appearance of the head, unchanged small 3 mm focus of hypodensity in the right ICA bulb now favored to be a small web or chronic soft plaque rather than adherent thrombus.  MRI of the brain performed earlier today showed multiple punctate foci of acute ischemia within the right MCA territory, no hemorrhage or mass-effect.  There was an old posterior right parietal infarct and findings of mild chronic ischemic microangiopathy.  The patient underwent Doppler ultrasound of bilateral lower extremity earlier today which was negative for DVT bilaterally.  A transcranial Doppler with bubbles was performed earlier today which showed no significant PFO.   On admission, the patient had a white blood cell count 13.1, hemoglobin 6.7, and platelet count greater than 1 million.  Review of her prior CBCs have shown that her platelets have been consistently elevated at greater than 800,000 dating back to at least 2019.  I do  not have labs available to me prior to that date.  She has been consistently anemic dating back to 2015 with a hemoglobin typically in the 7-10 range.  Her last ferritin and iron studies are available to me were performed on 12/18/2017 and they were normal at that time.  Prior to that, they were performed in September 2019 at her ferritin, percent saturation, and iron levels were all low.  A repeat ferritin, iron studies, vitamin B12 level, and folate level have been ordered and are pending.  The patient is supposed to be taking ferrous sulfate 325 mg twice daily, but the patient reports that she does not take this routinely due to constipation.  She has tried taking a stool softener along with the oral iron, but this has not helped.  Additionally, she has received 1 dose of Feraheme 510 mg IV on 11/11/2017.  The patient reports that she is supposed to take aspirin 81 mg daily, but does not routinely take this.  The patient reports that she has a longstanding history of elevated platelets and was even told about them at least 20 years ago with the birth of one of her children.  She also has a longstanding history of iron deficiency anemia and has required transfusions on at least 3 occasions.  When seen today, the patient reports that her left-sided weakness has improved.  She still has some numbness on her left side and some vision changes in her right eye.  She reports that she has these episodes intermittently which typically last about 15 minutes.  If they last longer or she has several in one day, she will seek further evaluation in the  emergency room. She reports having very heavy periods once a month the last 7 to 10 days with heavy bleeding and clotting.  She reports that she has seen her gynecologist who is recommending surgery, but she has not had the procedure.  She is unsure if she wants to have surgery.  She denies any other bleeding such as epistaxis, hemoptysis, hematemesis, hematuria, melena,  hematochezia.  Denies recent fever or chills.  No anorexia or weight loss.  Denies chest discomfort, shortness of breath, cough, abdominal pain, nausea, vomiting.  The patient is married and has 2 children.  She works for an [employer].  Denies alcohol and tobacco use.  Hematology was asked see the patient to make recommendations regarding her thrombocytosis.  Past Medical History:  Diagnosis Date  . Bipolar 1 disorder ([place])   . Complication of anesthesia   . Depression   . Hypertension   . PONV (postoperative nausea and vomiting)   . Stroke [facility])   :    Past Surgical History:  Procedure Laterality Date  . CESAREAN SECTION     X2  . HERNIA REPAIR    . TEE WITHOUT CARDIOVERSION N/A 11/13/2017   Procedure: TRANSESOPHAGEAL ECHOCARDIOGRAM (TEE) bubble study;  Surgeon: Skeet Latch, MD;  Location: [place];  Service: Cardiovascular;  Laterality: N/A;  :   CURRENT MEDS: Current Facility-Administered Medications  Medication Dose Route Frequency Provider Last Rate Last Admin  .  stroke: mapping our early stages of recovery book   Does not apply Once Opyd, Ilene Qua, MD      . 0.9 %  sodium chloride infusion  10 mL/hr Intravenous Once Opyd, Ilene Qua, MD      . acetaminophen (TYLENOL) tablet 650 mg  650 mg Oral Q4H PRN Opyd, Ilene Qua, MD       Or  . acetaminophen (TYLENOL) 160 MG/5ML solution 650 mg  650 mg Per Tube Q4H PRN Opyd, Ilene Qua, MD       Or  . acetaminophen (TYLENOL) suppository 650 mg  650 mg Rectal Q4H PRN Opyd, Ilene Qua, MD      . aspirin EC tablet 81 mg  81 mg Oral Daily Opyd, Ilene Qua, MD      . enoxaparin (LOVENOX) injection 40 mg  40 mg Subcutaneous Q24H Opyd, Ilene Qua, MD   40 mg at 04/10/19 0445  . labetalol (NORMODYNE) injection 10 mg  10 mg Intravenous Q2H PRN Opyd, Ilene Qua, MD      . senna-docusate (Senokot-S) tablet 1 tablet  1 tablet Oral QHS PRN Opyd, Ilene Qua, MD       Current Outpatient Medications  Medication Sig Dispense Refill  .  aspirin EC 81 MG tablet Take 81 mg by mouth daily.    . ferrous sulfate 325 (65 FE) MG tablet Take 1 tablet (325 mg total) by mouth 2 (two) times daily with a meal. 60 tablet 0  . folic acid (FOLVITE) 1 MG tablet Take 2 tablets (2 mg total) by mouth daily. (Patient taking differently: Take 1 mg by mouth daily. ) 30 tablet 0  . propranolol (INDERAL) 20 MG tablet Take 1 tablet (20 mg total) by mouth 2 (two) times daily. 60 tablet 4  . aspirin 325 MG tablet Take 1 tablet (325 mg total) by mouth daily. (Patient not taking: Reported on 04/09/2019) 60 tablet 1  . benzonatate (TESSALON) 100 MG capsule Take 1 capsule (100 mg total) by mouth every 8 (eight) hours. (Patient not taking: Reported on 04/09/2019)  21 capsule 0  . topiramate (TOPAMAX) 50 MG tablet Take 0.5 tablets (25 mg total) by mouth 2 (two) times daily for 7 days. (Patient not taking: Reported on 04/09/2019) 7 tablet 0      Allergies  Allergen Reactions  . Food Itching and Swelling    Reaction to Melons (lips and eyes swelled)  . Penicillins Hives, Itching and Other (See Comments)    Hair fell out Did it involve swelling of the face/tongue/throat, SOB, or low BP? No Did it involve sudden or severe rash/hives, skin peeling, or any reaction on the inside of your mouth or nose? Yes Did you need to seek medical attention at a hospital or doctor's office? In hospital reaction When did it last happen?44 yrs old If all above answers are "NO", may proceed with cephalosporin use.  :  Family History  Problem Relation Age of Onset  . Hypertension Mother   . Stroke Father   . Stroke Maternal Aunt   :  Social History   Socioeconomic History  . Marital status: Married    Spouse name: Not on file  . Number of children: Not on file  . Years of education: Not on file  . Highest education level: Not on file  Occupational History  . Not on file  Tobacco Use  . Smoking status: Never Smoker  . Smokeless tobacco: Never Used  Substance  and Sexual Activity  . Alcohol use: Yes    Comment: Occasional  . Drug use: No  . Sexual activity: Yes    Birth control/protection: None  Other Topics Concern  . Not on file  Social History Narrative  . Not on file   Social Determinants of Health   Financial Resource Strain:   . Difficulty of Paying Living Expenses: Not on file  Food Insecurity:   . Worried About Charity fundraiser in the Last Year: Not on file  . Ran Out of Food in the Last Year: Not on file  Transportation Needs:   . Lack of Transportation (Medical): Not on file  . Lack of Transportation (Non-Medical): Not on file  Physical Activity:   . Days of Exercise per Week: Not on file  . Minutes of Exercise per Session: Not on file  Stress:   . Feeling of Stress : Not on file  Social Connections:   . Frequency of Communication with Friends and Family: Not on file  . Frequency of Social Gatherings with Friends and Family: Not on file  . Attends Religious Services: Not on file  . Active Member of Clubs or Organizations: Not on file  . Attends Archivist Meetings: Not on file  . Marital Status: Not on file  Intimate Partner Violence:   . Fear of Current or Ex-Partner: Not on file  . Emotionally Abused: Not on file  . Physically Abused: Not on file  . Sexually Abused: Not on file  :  REVIEW OF SYSTEMS: A comprehensive 14 point review of systems was negative except as noted in the HPI.  Exam: Patient Vitals for the past 24 hrs:  BP Temp Temp src Pulse Resp SpO2  04/10/19 1200 (!) 143/87 -- -- (!) 59 20 100 %  04/10/19 0845 (!) 159/88 -- -- 68 (!) 25 100 %  04/10/19 0800 (!) 140/100 -- -- 65 (!) 21 100 %  04/10/19 0700 (!) 144/76 -- -- 64 (!) 22 100 %  04/10/19 0622 (!) 154/91 -- -- 68 11 100 %  04/10/19 0350 Marland Kitchen)  173/75 -- -- 67 19 100 %  04/10/19 0300 (!) 155/89 -- -- 70 -- 100 %  04/10/19 0130 (!) 174/106 -- -- 70 (!) 28 99 %  04/09/19 2345 (!) 131/97 -- -- 77 (!) 27 100 %  04/09/19 2315 (!)  148/79 -- -- 81 (!) 27 100 %  04/09/19 2310 (!) 166/104 98.9 F (37.2 C) Oral 85 (!) 25 --  04/09/19 2255 (!) 155/100 99 F (37.2 C) Oral 72 (!) 22 --  04/09/19 2215 (!) 163/96 -- -- 78 (!) 25 100 %  04/09/19 2145 (!) 143/88 -- -- 72 (!) 28 99 %  04/09/19 2115 (!) 156/98 -- -- 71 (!) 22 99 %  04/09/19 2045 (!) 142/91 -- -- 70 18 96 %  04/09/19 2000 (!) 144/91 -- -- 75 15 100 %  04/09/19 1930 (!) 146/91 -- -- 69 (!) 22 100 %  04/09/19 1916 (!) 149/117 98.8 F (37.1 C) Oral 69 16 100 %  04/09/19 1915 (!) 149/117 -- -- 70 14 100 %    General:  well-nourished in no acute distress.   Eyes:  PERRL, no scleral icterus.   ENT:  There were no oropharyngeal lesions.   Neck was without thyromegaly.   Lymphatics:  Negative cervical, supraclavicular or axillary adenopathy. Respiratory: lungs were clear bilaterally without wheezing or crackles.  Cardiovascular:  Regular rate and rhythm, S1/S2, without murmur, rub or gallop. There was no pedal edema.   GI:  abdomen was soft, flat, nontender, nondistended, without organomegaly. Musculoskeletal:  no spinal tenderness of palpation of vertebral spine.   Skin exam was without ecchymosis, petechiae.   Neuro exam was nonfocal.  Patient was alert and oriented.  Attention was good.  Language was appropriate.  Mood was normal without depression.  Speech was not pressured.  Thought content was not tangential.    LABS:  Lab Results  Component Value Date   WBC 13.5 (H) 04/10/2019   HGB 7.9 (L) 04/10/2019   HCT 28.4 (L) 04/10/2019   PLT 956 (HH) 04/10/2019   GLUCOSE 111 (H) 04/09/2019   CHOL 76 04/10/2019   TRIG 78 04/10/2019   HDL 33 (L) 04/10/2019   LDLCALC 27 04/10/2019   ALT 12 04/09/2019   AST 20 04/09/2019   NA 139 04/09/2019   K 3.9 04/09/2019   CL 107 04/09/2019   CREATININE 0.70 04/09/2019   BUN 7 04/09/2019   CO2 23 04/09/2019   INR 1.2 04/09/2019   HGBA1C 5.5 04/10/2019    CT Code Stroke CTA Head W/WO contrast  Result Date:  04/09/2019 CLINICAL DATA:  44 year old female with left side numbness. 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 EXAM: CT ANGIOGRAPHY HEAD AND NECK CT PERFUSION BRAIN TECHNIQUE: Multidetector CT imaging of the head and neck was performed using the standard protocol during bolus administration of intravenous contrast. Multiplanar CT image reconstructions and MIPs were obtained to evaluate the vascular anatomy. Carotid stenosis measurements (when applicable) are obtained utilizing NASCET criteria, using the distal internal carotid diameter as the denominator. Multiphase CT imaging of the brain was performed following IV bolus contrast injection. Subsequent parametric perfusion maps were calculated using RAPID software. CONTRAST:  175mL OMNIPAQUE IOHEXOL 350 MG/ML SOLN COMPARISON:  Plain head CT 2033 hours today. FINDINGS: CT Brain Perfusion Findings: ASPECTS: 10 CBF (<30%) Volume: None Perfusion (Tmax>6.0s) volume: None Mismatch Volume: Not applicable Infarction Location:Not applicable CTA NECK Skeleton: No acute osseous abnormality identified.  Upper chest: Negative. Other neck: Negative. Aortic arch: 3 vessel arch configuration with no arch or proximal great vessel atherosclerosis. Right carotid system: Negative right CCA. At the right carotid bifurcation there is a subtle persistent small round hypodense area at the posterior bulb measuring about 3 mm (series 7, image 86). This appears unchanged since November. Otherwise the right bifurcation is widely patent. Negative cervical right ICA otherwise, mild tortuosity distal to the bulb. Left carotid system: Stable and negative aside from tortuosity. Vertebral arteries: Proximal right subclavian artery and right vertebral artery origin are normal. The right vertebral artery is patent to the skull base with tortuosity but no plaque or stenosis. Proximal left subclavian artery and left vertebral  artery origin are normal aside from tortuosity. The left vertebral appears mildly dominant, with V2 segment tortuosity but no plaque or stenosis to the skull base. CTA HEAD Posterior circulation: Tortuous distal vertebral arteries with no plaque or stenosis. Patent PICA origins and vertebrobasilar junction. Tortuous basilar artery is patent without stenosis. Basilar tip, SCA and PCA origins appear stable and within normal limits. Posterior communicating arteries are diminutive or absent. Bilateral PCA branches are stable and within normal limits. Anterior circulation: Both ICA siphons are patent. There is tortuosity and mild irregularity of the left siphon with no discrete plaque or stenosis. This is stable. Right siphon is tortuous without plaque or stenosis. Patent carotid termini. MCA and ACA origins remain normal. Mildly dominant right A1 segment. Tortuous A1s. Anterior communicating artery and bilateral ACA branches are within normal limits. Left MCA M1 segment and bifurcation are patent without stenosis. Left MCA branches are stable. Right MCA M1 segment and bifurcation are patent without stenosis. Right MCA branches are stable. Venous sinuses: Stable patency since November. Dominant appearing right transverse and sigmoid sinuses, with some effacement of those bilateral sinuses. Anatomic variants: Mildly dominant left vertebral artery, right ACA A1 segment. Review of the MIP images confirms the above findings IMPRESSION: 1. Negative for emergent large vessel occlusion, negative CTP, and stable CTA appearance of the head and neck since November. 2. Unchanged small 3 mm focus of hypodensity in the right ICA bulb, now favored to be a small web or chronic soft plaque rather than adherent thrombus. 3. No other atherosclerosis identified. No significant arterial stenosis. These results were communicated to Dr. Cheral Marker at 10:20 pm on 04/09/2019 by text page via the [facility] messaging system. Electronically Signed   By: Genevie Ann M.D.   On: 04/09/2019 22:24   CT Code Stroke CTA Neck W/WO contrast  Result Date: 04/09/2019 CLINICAL DATA:  44 year old female with left side numbness. 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 EXAM: CT ANGIOGRAPHY HEAD AND NECK CT PERFUSION BRAIN TECHNIQUE: Multidetector CT imaging of the head and neck was performed using the standard protocol during bolus administration of intravenous contrast. Multiplanar CT image reconstructions and MIPs were obtained to evaluate the vascular anatomy. Carotid stenosis measurements (when applicable) are obtained utilizing NASCET criteria, using the distal internal carotid diameter as the denominator. Multiphase CT imaging of the brain was performed following IV bolus contrast injection. Subsequent parametric perfusion maps were calculated using RAPID software. CONTRAST:  118mL OMNIPAQUE IOHEXOL 350 MG/ML SOLN COMPARISON:  Plain head CT 2033 hours today. FINDINGS: CT Brain Perfusion Findings: ASPECTS: 10 CBF (<30%) Volume: None Perfusion (Tmax>6.0s) volume: None Mismatch Volume: Not applicable Infarction Location:Not applicable CTA NECK Skeleton: No acute osseous abnormality identified. Upper chest: Negative.  Other neck: Negative. Aortic arch: 3 vessel arch configuration with no arch or proximal great vessel atherosclerosis. Right carotid system: Negative right CCA. At the right carotid bifurcation there is a subtle persistent small round hypodense area at the posterior bulb measuring about 3 mm (series 7, image 86). This appears unchanged since November. Otherwise the right bifurcation is widely patent. Negative cervical right ICA otherwise, mild tortuosity distal to the bulb. Left carotid system: Stable and negative aside from tortuosity. Vertebral arteries: Proximal right subclavian artery and right vertebral artery origin are normal. The right vertebral artery is patent to the skull base  with tortuosity but no plaque or stenosis. Proximal left subclavian artery and left vertebral artery origin are normal aside from tortuosity. The left vertebral appears mildly dominant, with V2 segment tortuosity but no plaque or stenosis to the skull base. CTA HEAD Posterior circulation: Tortuous distal vertebral arteries with no plaque or stenosis. Patent PICA origins and vertebrobasilar junction. Tortuous basilar artery is patent without stenosis. Basilar tip, SCA and PCA origins appear stable and within normal limits. Posterior communicating arteries are diminutive or absent. Bilateral PCA branches are stable and within normal limits. Anterior circulation: Both ICA siphons are patent. There is tortuosity and mild irregularity of the left siphon with no discrete plaque or stenosis. This is stable. Right siphon is tortuous without plaque or stenosis. Patent carotid termini. MCA and ACA origins remain normal. Mildly dominant right A1 segment. Tortuous A1s. Anterior communicating artery and bilateral ACA branches are within normal limits. Left MCA M1 segment and bifurcation are patent without stenosis. Left MCA branches are stable. Right MCA M1 segment and bifurcation are patent without stenosis. Right MCA branches are stable. Venous sinuses: Stable patency since November. Dominant appearing right transverse and sigmoid sinuses, with some effacement of those bilateral sinuses. Anatomic variants: Mildly dominant left vertebral artery, right ACA A1 segment. Review of the MIP images confirms the above findings IMPRESSION: 1. Negative for emergent large vessel occlusion, negative CTP, and stable CTA appearance of the head and neck since November. 2. Unchanged small 3 mm focus of hypodensity in the right ICA bulb, now favored to be a small web or chronic soft plaque rather than adherent thrombus. 3. No other atherosclerosis identified. No significant arterial stenosis. These results were communicated to Dr. Cheral Marker at  10:20 pm on 04/09/2019 by text page via the [facility] messaging system. Electronically Signed   By: Genevie Ann M.D.   On: 04/09/2019 22:24   MR BRAIN WO CONTRAST  Result Date: 04/10/2019 CLINICAL DATA:  Left-sided numbness and weakness EXAM: MRI HEAD WITHOUT CONTRAST TECHNIQUE: Multiplanar, multiecho pulse sequences of the brain and surrounding structures were obtained without intravenous contrast. COMPARISON:  Brain MRI 12/27/2018 FINDINGS: BRAIN: There are scattered punctate foci of abnormal diffusion restriction within the right MCA territory. No contralateral diffusion abnormality. There is an old posterior right parietal infarct. Multifocal white matter hyperintensity, most commonly due to chronic ischemic microangiopathy. Normal volume of brain parenchyma and CSF spaces. Midline structures are normal. VASCULAR: Major flow voids are preserved. Unchanged hemosiderin deposition within the posterior right hemisphere. SKULL AND UPPER CERVICAL SPINE: Normal calvarium and skull base. Visualized upper cervical spine and soft tissues are normal. SINUSES/ORBITS: No fluid levels or advanced mucosal thickening. No mastoid or middle ear effusion. Normal orbits. IMPRESSION: 1. Multiple punctate foci of acute ischemia within the right MCA territory. No hemorrhage or mass effect. 2. Old posterior right parietal infarct and findings of mild chronic ischemic microangiopathy. Electronically Signed  By: Ulyses Jarred M.D.   On: 04/10/2019 02:43   CT Code Stroke Cerebral Perfusion with contrast  Result Date: 04/09/2019 CLINICAL DATA:  44 year old female with left side numbness. 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 EXAM: CT ANGIOGRAPHY HEAD AND NECK CT PERFUSION BRAIN TECHNIQUE: Multidetector CT imaging of the head and neck was performed using the standard protocol during bolus administration of intravenous contrast. Multiplanar CT image  reconstructions and MIPs were obtained to evaluate the vascular anatomy. Carotid stenosis measurements (when applicable) are obtained utilizing NASCET criteria, using the distal internal carotid diameter as the denominator. Multiphase CT imaging of the brain was performed following IV bolus contrast injection. Subsequent parametric perfusion maps were calculated using RAPID software. CONTRAST:  137mL OMNIPAQUE IOHEXOL 350 MG/ML SOLN COMPARISON:  Plain head CT 2033 hours today. FINDINGS: CT Brain Perfusion Findings: ASPECTS: 10 CBF (<30%) Volume: None Perfusion (Tmax>6.0s) volume: None Mismatch Volume: Not applicable Infarction Location:Not applicable CTA NECK Skeleton: No acute osseous abnormality identified. Upper chest: Negative. Other neck: Negative. Aortic arch: 3 vessel arch configuration with no arch or proximal great vessel atherosclerosis. Right carotid system: Negative right CCA. At the right carotid bifurcation there is a subtle persistent small round hypodense area at the posterior bulb measuring about 3 mm (series 7, image 86). This appears unchanged since November. Otherwise the right bifurcation is widely patent. Negative cervical right ICA otherwise, mild tortuosity distal to the bulb. Left carotid system: Stable and negative aside from tortuosity. Vertebral arteries: Proximal right subclavian artery and right vertebral artery origin are normal. The right vertebral artery is patent to the skull base with tortuosity but no plaque or stenosis. Proximal left subclavian artery and left vertebral artery origin are normal aside from tortuosity. The left vertebral appears mildly dominant, with V2 segment tortuosity but no plaque or stenosis to the skull base. CTA HEAD Posterior circulation: Tortuous distal vertebral arteries with no plaque or stenosis. Patent PICA origins and vertebrobasilar junction. Tortuous basilar artery is patent without stenosis. Basilar tip, SCA and PCA origins appear stable and  within normal limits. Posterior communicating arteries are diminutive or absent. Bilateral PCA branches are stable and within normal limits. Anterior circulation: Both ICA siphons are patent. There is tortuosity and mild irregularity of the left siphon with no discrete plaque or stenosis. This is stable. Right siphon is tortuous without plaque or stenosis. Patent carotid termini. MCA and ACA origins remain normal. Mildly dominant right A1 segment. Tortuous A1s. Anterior communicating artery and bilateral ACA branches are within normal limits. Left MCA M1 segment and bifurcation are patent without stenosis. Left MCA branches are stable. Right MCA M1 segment and bifurcation are patent without stenosis. Right MCA branches are stable. Venous sinuses: Stable patency since November. Dominant appearing right transverse and sigmoid sinuses, with some effacement of those bilateral sinuses. Anatomic variants: Mildly dominant left vertebral artery, right ACA A1 segment. Review of the MIP images confirms the above findings IMPRESSION: 1. Negative for emergent large vessel occlusion, negative CTP, and stable CTA appearance of the head and neck since November. 2. Unchanged small 3 mm focus of hypodensity in the right ICA bulb, now favored to be a small web or chronic soft plaque rather than adherent thrombus. 3. No other atherosclerosis identified. No significant arterial stenosis. These results were communicated to Dr. Cheral Marker at 10:20 pm on 04/09/2019 by text page via the [facility] messaging system. Electronically Signed   By: Lemmie Evens  [place] Crane M.D.   On: 04/09/2019 22:24   VAS [place] TRANSCRANIAL DOPPLER W BUBBLES  Result Date: 04/10/2019  Transcranial Doppler with Bubble Indications: Stroke. Comparison Study: No prior study Performing Technologist: Maudry Mayhew MHA, RDMS, RVT, RDCS  Examination Guidelines: A complete evaluation includes B-mode imaging, spectral Doppler, color Doppler, and power Doppler as needed of all accessible  portions of each vessel. Bilateral testing is considered an integral part of a complete examination. Limited examinations for reoccurring indications may be performed as noted.  Summary:  A vascular evaluation was performed. The right middle cerebral artery was studied. An IV was inserted into the patient's right forearm. Verbal informed consent was obtained.  No evidence of HITS (high intensity transient signals) at rest or with Valsalva maneuver. Therefore, there is no evidence of clinically significant PFO (patent foramen ovale). *See table(s) above for TCD measurements and observations.    Preliminary    CT HEAD CODE STROKE WO CONTRAST  Result Date: 04/09/2019 CLINICAL DATA:  Code stroke. 44 year old female with left side numbness. 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 EXAM: CT HEAD WITHOUT CONTRAST TECHNIQUE: Contiguous axial images were obtained from the base of the skull through the vertex without intravenous contrast. COMPARISON:  Head CT without contrast 12/28/2018. Brain MRI and intracranial MRA, CTA head and neck 12/27/2018. 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 No midline shift, ventriculomegaly, mass effect, evidence of mass lesion, intracranial hemorrhage or evidence of cortically based acute infarction. Gray-white matter differentiation in the left hemisphere and posterior fossa remains normal. Vascular: No suspicious intracranial vascular hyperdensity. Skull: No acute osseous abnormality identified. Sinuses/Orbits: Visualized paranasal sinuses and mastoids are stable and well pneumatized. Other: Visualized orbits and scalp soft tissues are within normal limits. ASPECTS [facility] Stroke Program Early CT Score) Total score (0-10 with 10 being normal): 10 IMPRESSION: 1. No acute cortically based infarct or acute intracranial hemorrhage  identified. 2. 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 ASPECTS 10. 3. These results were communicated to Dr. Cheral Marker at 8:45 pm on 04/09/2019 by text page via the [facility] messaging system. Electronically Signed   By: Genevie Ann M.D.   On: 04/09/2019 20:45   VAS [place] LOWER EXTREMITY VENOUS (DVT)  Result Date: 04/10/2019  Lower Venous DVTStudy Indications: Stroke.  Comparison Study: No prior study Performing Technologist: Maudry Mayhew MHA, RDMS, RVT, RDCS  Examination Guidelines: A complete evaluation includes B-mode imaging, spectral Doppler, color Doppler, and power Doppler as needed of all accessible portions of each vessel. Bilateral testing is considered an integral part of a complete examination. Limited examinations for reoccurring indications may be performed as noted. The reflux portion of the exam is performed with the patient in reverse Trendelenburg.  +---------+---------------+---------+-----------+----------+--------------+ RIGHT    CompressibilityPhasicitySpontaneityPropertiesThrombus Aging +---------+---------------+---------+-----------+----------+--------------+ CFV      Full           Yes      Yes                                 +---------+---------------+---------+-----------+----------+--------------+ SFJ      Full                                                        +---------+---------------+---------+-----------+----------+--------------+  FV Prox  Full                                                        +---------+---------------+---------+-----------+----------+--------------+ FV Mid   Full                                                        +---------+---------------+---------+-----------+----------+--------------+ FV DistalFull                                                        +---------+---------------+---------+-----------+----------+--------------+ PFV      Full                                                         +---------+---------------+---------+-----------+----------+--------------+ POP      Full           Yes      Yes                                 +---------+---------------+---------+-----------+----------+--------------+ PTV      Full                                                        +---------+---------------+---------+-----------+----------+--------------+ PERO     Full                                                        +---------+---------------+---------+-----------+----------+--------------+   +---------+---------------+---------+-----------+----------+--------------+ LEFT     CompressibilityPhasicitySpontaneityPropertiesThrombus Aging +---------+---------------+---------+-----------+----------+--------------+ CFV      Full           Yes      Yes                                 +---------+---------------+---------+-----------+----------+--------------+ SFJ      Full                                                        +---------+---------------+---------+-----------+----------+--------------+ FV Prox  Full                                                        +---------+---------------+---------+-----------+----------+--------------+  FV Mid   Full                                                        +---------+---------------+---------+-----------+----------+--------------+ FV DistalFull                                                        +---------+---------------+---------+-----------+----------+--------------+ PFV      Full                                                        +---------+---------------+---------+-----------+----------+--------------+ POP      Full           Yes      Yes                                 +---------+---------------+---------+-----------+----------+--------------+ PTV      Full                                                         +---------+---------------+---------+-----------+----------+--------------+ PERO     Full                                                        +---------+---------------+---------+-----------+----------+--------------+     Summary: RIGHT: - There is no evidence of deep vein thrombosis in the lower extremity.  - No cystic structure found in the popliteal fossa.  LEFT: - There is no evidence of deep vein thrombosis in the lower extremity.  - No cystic structure found in the popliteal fossa.  *See table(s) above for measurements and observations. Electronically signed by Deitra Mayo MD on 04/10/2019 at 1:57:33 PM.    Final     ASSESSMENT AND PLAN:   1. Thrombocytosis --The patient has significant thrombocytosis which has been present for at least 2 years and per her report dating back for at least 20 years --Suspect thrombocytosis is reactive to iron deficiency anemia. --Agree with checking ferritin and iron studies. --She has not tolerated oral iron well secondary to constipation and has received a dose of Feraheme in the past which she tolerated well. --If her labs show that she is iron deficient, we can consider giving her a dose of Feraheme. --We will hold off on additional work-up for MPN while results are pending.  2.  Microcytic anemia --Status post 1 unit PRBCs with improvement of her hemoglobin. --Has not tolerated oral iron in the past secondary to constipation. --We will consider her for IV iron pending results of ferritin and iron studies.  3. Left sided weakness, history of right MCA territory CVA, and  migraines --CT of the head, CT perfusion, and CTA head neck show stable, chronic changes --Left-sided weakness, numbness, and vision disturbance seem to be improving. --She is on aspirin 81 mg daily and has been started on Lovenox. --Neurology has been consulted.  4.  Menorrhagia secondary to uterine fibroids --The patient reports significant bleeding secondary to  uterine fibroids. --Recommend outpatient follow-up with gynecology for further management of her menorrhagia.  Thank you for this referral.  Mikey Bussing, DNP, AGPCNP-BC, AOCNP

## 2019-04-10 NOTE — ED Notes (Signed)
Pt ambulatory to restroom

## 2019-04-10 NOTE — Progress Notes (Signed)
TCD bubble study and bilaterally lower extremity venous duplex were completed.  Refer to "CV Proc" under chart review to view preliminary results.  04/10/2019 10:55 AM Kelby Aline., MHA, RVT, RDCS, RDMS

## 2019-04-11 ENCOUNTER — Encounter (HOSPITAL_COMMUNITY): Payer: Self-pay | Admitting: Family Medicine

## 2019-04-11 DIAGNOSIS — R7989 Other specified abnormal findings of blood chemistry: Secondary | ICD-10-CM

## 2019-04-11 DIAGNOSIS — E538 Deficiency of other specified B group vitamins: Secondary | ICD-10-CM

## 2019-04-11 DIAGNOSIS — Z8673 Personal history of transient ischemic attack (TIA), and cerebral infarction without residual deficits: Secondary | ICD-10-CM

## 2019-04-11 HISTORY — DX: Deficiency of other specified B group vitamins: E53.8

## 2019-04-11 HISTORY — DX: Other specified abnormal findings of blood chemistry: R79.89

## 2019-04-11 LAB — CBC WITH DIFFERENTIAL/PLATELET
Abs Immature Granulocytes: 0 K/uL (ref 0.00–0.07)
Basophils Absolute: 0.1 K/uL (ref 0.0–0.1)
Basophils Relative: 1 %
Eosinophils Absolute: 0.2 K/uL (ref 0.0–0.5)
Eosinophils Relative: 2 %
HCT: 27.7 % — ABNORMAL LOW (ref 36.0–46.0)
Hemoglobin: 8 g/dL — ABNORMAL LOW (ref 12.0–15.0)
Lymphocytes Relative: 21 %
Lymphs Abs: 2.1 K/uL (ref 0.7–4.0)
MCH: 18.4 pg — ABNORMAL LOW (ref 26.0–34.0)
MCHC: 28.9 g/dL — ABNORMAL LOW (ref 30.0–36.0)
MCV: 63.7 fL — ABNORMAL LOW (ref 80.0–100.0)
Monocytes Absolute: 0.4 K/uL (ref 0.1–1.0)
Monocytes Relative: 4 %
Neutro Abs: 7.3 K/uL (ref 1.7–7.7)
Neutrophils Relative %: 72 %
Platelets: 845 K/uL — ABNORMAL HIGH (ref 150–400)
RBC: 4.35 MIL/uL (ref 3.87–5.11)
RDW: 27.9 % — ABNORMAL HIGH (ref 11.5–15.5)
WBC: 10.1 K/uL (ref 4.0–10.5)
nRBC: 0 /100{WBCs}
nRBC: 0.3 % — ABNORMAL HIGH (ref 0.0–0.2)

## 2019-04-11 LAB — BASIC METABOLIC PANEL
Anion gap: 11 (ref 5–15)
BUN: 6 mg/dL (ref 6–20)
CO2: 23 mmol/L (ref 22–32)
Calcium: 9 mg/dL (ref 8.9–10.3)
Chloride: 103 mmol/L (ref 98–111)
Creatinine, Ser: 0.9 mg/dL (ref 0.44–1.00)
GFR calc Af Amer: >60 mL/min (ref >60)
GFR calc non Af Amer: >60 mL/min (ref >60)
Glucose, Bld: 101 mg/dL — ABNORMAL HIGH (ref 70–99)
Potassium: 3.3 mmol/L — ABNORMAL LOW (ref 3.5–5.1)
Sodium: 137 mmol/L (ref 135–145)

## 2019-04-11 LAB — MAGNESIUM: Magnesium: 2 mg/dL (ref 1.7–2.4)

## 2019-04-11 LAB — ANA W/REFLEX IF POSITIVE: Anti Nuclear Antibody (ANA): NEGATIVE

## 2019-04-11 MED ORDER — POTASSIUM CHLORIDE CRYS ER 20 MEQ PO TBCR
40.0000 meq | EXTENDED_RELEASE_TABLET | Freq: Once | ORAL | Status: AC
  Administered 2019-04-11: 40 meq via ORAL
  Filled 2019-04-11: qty 2

## 2019-04-11 MED ORDER — SODIUM CHLORIDE 0.9 % IV SOLN
510.0000 mg | Freq: Once | INTRAVENOUS | Status: AC
  Administered 2019-04-11: 510 mg via INTRAVENOUS
  Filled 2019-04-11: qty 17

## 2019-04-11 MED ORDER — CYANOCOBALAMIN 1000 MCG/ML IJ SOLN
1000.0000 ug | Freq: Every day | INTRAMUSCULAR | Status: DC
  Administered 2019-04-11 – 2019-04-12 (×2): 1000 ug via SUBCUTANEOUS
  Filled 2019-04-11 (×2): qty 1

## 2019-04-11 NOTE — Evaluation (Signed)
Occupational Therapy Evaluation Patient Details Name: Mallory Robinson MRN: [id number] DOB: 02/12/1976 Today's Date: 04/11/2019    History of Present Illness 44 y.o. female with a PMHx of depression, bipolar disorder, CVA, HTN and EtOH dependence who presented to the ED for evaluation of left sided sensory numbness with worsened left sided weakness relative to her baseline impairment secondary to a stroke 2 years ago. The neurological symptoms were accompanied by a right retroorbital headache with some slurred speech. CT head negative for acute abnormality, scattered R MCA encephalomalacia stable. Workup for complicated migraine, vs TIA.   Clinical Impression   PTA pt independent in BADL and IADL. At time of eval, she is able to complete bed mobility at mod I level and sit <> stands with supervision. No UE or coordination deficits noted, besides slight decreased in LUE strength from previous stroke. Pt currently endorsing R eye visual disturbance as only concern. Pt has R peripheral field deficits that presents like prisms and squiggly lines. Completed functional mobility beyond household distance to assess vision further in busy environment. Educated pt on turning head, using anchoring techniques for tracking, and home and community safety for peripheral deficits. Encouraged pt to not drive at this time. Recommend she d/c with OP OT for vision education- pt reports she prefers [place] location. Will continue to follow per POC listed below.    Follow Up Recommendations  Outpatient OT;Other (comment)(neuro for vision in [place])    Equipment Recommendations  None recommended by OT    Recommendations for Other Services       Precautions / Restrictions Precautions Precautions: Fall Precaution Comments: R peripheral vision deficit Restrictions Weight Bearing Restrictions: No      Mobility Bed Mobility Overal bed mobility: Modified Independent             General bed mobility comments:  Increased time to come to and from EOB.  Transfers Overall transfer level: Needs assistance   Transfers: Sit to/from Stand Sit to Stand: Supervision         General transfer comment: S for safety    Balance Overall balance assessment: Mild deficits observed, not formally tested                                         ADL either performed or assessed with clinical judgement   ADL Overall ADL's : At baseline                                       General ADL Comments: Pt at baseline for ADLs at this time. Remaining deficits are with R peripheral vision requiring increased education in compensatory techniques and safety     Vision Baseline Vision/History: Wears glasses Wears Glasses: At all times Patient Visual Report: Peripheral vision impairment Vision Assessment?: Yes Eye Alignment: Within Functional Limits Ocular Range of Motion: Within Functional Limits Tracking/Visual Pursuits: Requires cues, head turns, or add eye shifts to track Visual Fields: Right visual field deficit Additional Comments: pt presents with slight R peripheral field deficit. She describes environment looking like prisms and cannot make out details of shapes. Pt having increased difficulty with reading and with changes of light     Perception     Praxis      Pertinent Vitals/Pain Pain Assessment: No/denies pain  Hand Dominance     Extremity/Trunk Assessment Upper Extremity Assessment Upper Extremity Assessment: LUE deficits/detail LUE Deficits / Details: residual weakness secondary to CVA 2 years ago   Lower Extremity Assessment Lower Extremity Assessment: Defer to PT evaluation       Communication Communication Communication: No difficulties   Cognition Arousal/Alertness: Awake/alert Behavior During Therapy: WFL for tasks assessed/performed Overall Cognitive Status: Within Functional Limits for tasks assessed                                      General Comments       Exercises Exercises: Other exercises Other Exercises Other Exercises: sit<>stand performed 3 ways for neuromuscular re-ed; Feet together, R foot to the side, and R foot forward. Each variation was performed 5x.    Shoulder Instructions      Home Living Family/patient expects to be discharged to:: Private residence Living Arrangements: Spouse/significant other;Children Available Help at Discharge: Family;Available 24 hours/day Type of Home: House Home Access: Level entry     Home Layout: Two level;Bed/bath upstairs Alternate Level Stairs-Number of Steps: flight Alternate Level Stairs-Rails: Left Bathroom Shower/Tub: Teacher, early years/pre: Standard     Home Equipment: Tub bench;Grab bars - tub/shower;Walker - 2 wheels;Kasandra Knudsen - single point      Lives With: Significant other    Prior Functioning/Environment Level of Independence: Independent        Comments: 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 Pt reports using cane for outdoor ambulation when there are icy conditions or uneven terrain only. Pt with history of PT, OT, speech therapy.        OT Problem List: Impaired vision/perception;Decreased knowledge of precautions;Decreased activity tolerance      OT Treatment/Interventions: Self-care/ADL training;Visual/perceptual remediation/compensation;Patient/family education;Neuromuscular education;Therapeutic activities    OT Goals(Current goals can be found in the care plan section) Acute Rehab OT Goals Patient Stated Goal: go home with family OT Goal Formulation: With patient Time For Goal Achievement: 04/25/19 Potential to Achieve Goals: Good  OT Frequency: Min 2X/week   Barriers to D/C:            Co-evaluation              AM-PAC OT "6 Clicks" Daily Activity     Outcome Measure Help from another person eating meals?: None Help from another person taking care  of personal grooming?: None Help from another person toileting, which includes using toliet, bedpan, or urinal?: None Help from another person bathing (including washing, rinsing, drying)?: None Help from another person to put on and taking off regular upper body clothing?: None Help from another person to put on and taking off regular lower body clothing?: None 6 Click Score: 24   End of Session Nurse Communication: Mobility status  Activity Tolerance: Patient tolerated treatment well Patient left: in bed;with call bell/phone within reach  OT Visit Diagnosis: Other symptoms and signs involving the nervous system [id number])                Time: [id number] OT Time Calculation (min): 10 min Charges:  OT General Charges $OT Visit: 1 Visit OT Evaluation $OT Eval Moderate Complexity: 1 Mod  Zenovia Jarred, MSOT, OTR/L Acute Rehabilitation Services [facility] Office Number: [phone]  Zenovia Jarred 04/11/2019, 3:29 PM

## 2019-04-11 NOTE — Plan of Care (Signed)
Plan of care was reviewed with pt at bedside. PT worked with pt. Call bell in reach. Pt denies needs. Will continue to monitor. Tele- NSR.  Problem: Education: Goal: Knowledge of General Education information will improve Description: Including pain rating scale, medication(s)/side effects and non-pharmacologic comfort measures Outcome: Progressing   Problem: Health Behavior/Discharge Planning: Goal: Ability to manage health-related needs will improve Outcome: Progressing   Problem: Clinical Measurements: Goal: Ability to maintain clinical measurements within normal limits will improve Outcome: Progressing Goal: Will remain free from infection Outcome: Progressing Goal: Diagnostic test results will improve Outcome: Progressing Goal: Respiratory complications will improve Outcome: Progressing Goal: Cardiovascular complication will be avoided Outcome: Progressing   Problem: Activity: Goal: Risk for activity intolerance will decrease Outcome: Progressing   Problem: Nutrition: Goal: Adequate nutrition will be maintained Outcome: Progressing   Problem: Coping: Goal: Level of anxiety will decrease Outcome: Progressing   Problem: Elimination: Goal: Will not experience complications related to bowel motility Outcome: Progressing Goal: Will not experience complications related to urinary retention Outcome: Progressing   Problem: Pain Managment: Goal: General experience of comfort will improve Outcome: Progressing   Problem: Safety: Goal: Ability to remain free from injury will improve Outcome: Progressing   Problem: Skin Integrity: Goal: Risk for impaired skin integrity will decrease Outcome: Progressing   Problem: Education: Goal: Knowledge of disease or condition will improve Outcome: Progressing Goal: Knowledge of secondary prevention will improve Outcome: Progressing Goal: Knowledge of patient specific risk factors addressed and post discharge goals established  will improve Outcome: Progressing Goal: Individualized Educational Video(s) Outcome: Progressing   Problem: Health Behavior/Discharge Planning: Goal: Ability to manage health-related needs will improve Outcome: Progressing   Problem: Self-Care: Goal: Ability to participate in self-care as condition permits will improve Outcome: Progressing Goal: Ability to communicate needs accurately will improve Outcome: Progressing   Problem: Nutrition: Goal: Risk of aspiration will decrease Outcome: Progressing   Problem: Ischemic Stroke/TIA Tissue Perfusion: Goal: Complications of ischemic stroke/TIA will be minimized Outcome: Progressing

## 2019-04-11 NOTE — Evaluation (Signed)
Speech Language Pathology Evaluation Patient Details Name: Mallory Robinson MRN: [id number] DOB: May 14, 1975 Today's Date: 04/11/2019 Time: 1030-1100 SLP Time Calculation (min) (ACUTE ONLY): 30 min  Problem List:  Patient Active Problem List   Diagnosis Date Noted  . Folate deficiency 04/11/2019  . Low vitamin B12 level 04/11/2019  . Stroke-like episode 04/10/2019  . Acute nonintractable headache   . Left-sided weakness   . 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[place]) 04/09/2019  . Microcytic anemia 04/09/2019  . Thrombocytosis ([place]) 04/09/2019  . Chronic migraine 04/09/2019  . Hypertension 04/09/2019  . Acute ischemic stroke ([place]) 12/27/2018  . Cerebral embolism with cerebral infarction 11/11/2017  . Acute CVA (cerebrovascular accident) ([place]) 11/11/2017  . Neurological deficit present 11/10/2017  . Bipolar I disorder ([place]) 03/05/2013  . PTSD (post-traumatic stress disorder) 03/05/2013  . Alcohol dependence ([place]) 03/04/2013   Past Medical History:  Past Medical History:  Diagnosis Date  . Bipolar 1 disorder ([place])   . Complication of anesthesia   . Depression   . Hypertension   . Low vitamin B12 level 04/11/2019  . PONV (postoperative nausea and vomiting)   . Stroke [facility])    Past Surgical History:  Past Surgical History:  Procedure Laterality Date  . CESAREAN SECTION     X2  . HERNIA REPAIR    . TEE WITHOUT CARDIOVERSION N/A 11/13/2017   Procedure: TRANSESOPHAGEAL ECHOCARDIOGRAM (TEE) bubble study;  Surgeon: Skeet Latch, MD;  Location: [place];  Service: Cardiovascular;  Laterality: N/A;   HPI:  Patient is a 44 y.o. female with PMH: bipolar disorder, migraine headaches, menorrhagia, uterine fibroids, HTN, h/o CVA with resultant left sided weakness, COVID-19 infection December of 2020. She presented to ER for evaluation of left sided weakness, slurred speech and visual disturbance in right eye. MRI showed multiple punctate foci of acute ischemia within the right MCA  territory, no hemorrhage or mass-effect; old posterior right parietal infarct  was seen. Paitent negative for DVT's bilaterally in lower extermities.   Assessment / Plan / Recommendation Clinical Impression  Patient was assessed for speech-language and cognitive functioning via SLP with [place] administered. She scored in normal range for cognitive functioning as per [facility] version 8.1. She did not exhibit any oral motor weakness or loss of function, no slurring of speech with intelligibility at 100% at conversational level. She exhibited memory, reasoning, problem solving all WNL. At this time, SLP is not recommending inpatient or outpatient SLP treatment, however did encourage patient to request outpatient SLP evaluation if she feels her memory or attention, etc. decline. Patient was pleased that she performed well on the test.    SLP Assessment  SLP Recommendation/Assessment: Patient does not need any further Speech Lanaguage Pathology Services    Follow Up Recommendations  None    Frequency and Duration   N/A        SLP Evaluation Cognition  Overall Cognitive Status: Within Functional Limits for tasks assessed Orientation Level: Oriented X4 Memory: Appears intact Awareness: Appears intact Problem Solving: Appears intact Executive Function: Reasoning;Decision Making Reasoning: Appears intact Decision Making: Appears intact Safety/Judgment: Appears intact Comments: Patient scored a 29/30 on MOCA version 8.1 which places her in normal range (normal is > or =26). She had outpatient evaluation in end of October of 2020 and at that time was exhibiting deficits in divided attention and memory. She did not attend therapy because she and other family members contracted Covid-19 at different times towards end of year.       Comprehension  Auditory Comprehension Overall Auditory Comprehension: Appears within functional limits for tasks assessed    Expression Expression Primary Mode of  Expression: Verbal Verbal Expression Overall Verbal Expression: Appears within functional limits for tasks assessed   Oral / Motor  Oral Motor/Sensory Function Overall Oral Motor/Sensory Function: Within functional limits Motor Speech Overall Motor Speech: Appears within functional limits for tasks assessed Respiration: Within functional limits Resonance: Within functional limits Articulation: Within functional limitis Intelligibility: Intelligible Motor Planning: Witnin functional limits   GO                    Dannial Monarch 04/11/2019, 11:46 AM   Sonia Baller, MA, CCC-SLP Speech Therapy [facility] Acute Rehab Pager: [phone]

## 2019-04-11 NOTE — Progress Notes (Signed)
Physical Therapy Treatment Patient Details Name: Mallory Robinson MRN: [id number] DOB: [birth year] Today's Date: 04/11/2019    History of Present Illness 44 y.o. female with a PMHx of depression, bipolar disorder, CVA, HTN and EtOH dependence who presented to the ED for evaluation of left sided sensory numbness with worsened left sided weakness relative to her baseline impairment secondary to a stroke 2 years ago. The neurological symptoms were accompanied by a right retroorbital headache with some slurred speech. CT head negative for acute abnormality, scattered R MCA encephalomalacia stable. Workup for complicated migraine, vs TIA.    PT Comments    Pt is progressing well towards goals. During ambulation she reported increase in paresthesia in her L quad and L foot that seemed to increase with activity. Pt described feeling unsteady and was placing UE on quad for additional support, stating "I don't feel like I can trust it". Pt performed sit<>stand 3 ways to place R LE at disadvantage. This activity was appropriately challenging for pt. Feel pt would benefit from continued stilled PT to address weakness in LLE and reduce fall risk. Updated recommendations for OP neuro PT and discussed with PT. Will continue to follow acutely for mobility progression.     Follow Up Recommendations  Supervision for mobility/OOB;Outpatient PT(Neuro OPPT)     Equipment Recommendations  None recommended by PT    Recommendations for Other Services       Precautions / Restrictions Precautions Precautions: Fall Precaution Comments: R peripheral vision deficit Restrictions Weight Bearing Restrictions: No    Mobility  Bed Mobility Overal bed mobility: Modified Independent             General bed mobility comments: Increased time to come to and from EOB.  Transfers Overall transfer level: Needs assistance   Transfers: Sit to/from Stand Sit to Stand: Supervision         General transfer comment:  supervision for safety, increased time to rise but presents with good steadiness in standing.  Ambulation/Gait Ambulation/Gait assistance: Supervision Gait Distance (Feet): 350 Feet Assistive device: None Gait Pattern/deviations: Step-through pattern;Decreased stride length;Decreased weight shift to left Gait velocity: decr   General Gait Details: Mildly antalgic gait secondary to chronic CVA. Pt reports feelings of paresthesia on L foot and quad that increased with physical activity.   Stairs Stairs: Yes Stairs assistance: Supervision Stair Management: One rail Left;Step to pattern;Sideways Number of Stairs: 16 General stair comments: Pt able to negotiate 1 flight of steps with supervision for safety. She utalized good technique and sequencing.   Wheelchair Mobility    Modified Rankin (Stroke Patients Only)       Balance Overall balance assessment: Mild deficits observed, not formally tested                                          Cognition Arousal/Alertness: Awake/alert Behavior During Therapy: WFL for tasks assessed/performed Overall Cognitive Status: Within Functional Limits for tasks assessed                                        Exercises Other Exercises Other Exercises: sit<>stand performed 3 ways for neuromuscular re-ed; Feet together, R foot to the side, and R foot forward. Each variation was performed 5x.     General Comments  Pertinent Vitals/Pain Pain Assessment: No/denies pain    Home Living     Available Help at Discharge: Family;Available 24 hours/day Type of Home: House              Prior Function            PT Goals (current goals can now be found in the care plan section) Acute Rehab PT Goals Patient Stated Goal: go home with family PT Goal Formulation: With patient Time For Goal Achievement: 04/24/19 Potential to Achieve Goals: Good Progress towards PT goals: Progressing toward goals     Frequency    Min 3X/week      PT Plan Discharge plan needs to be updated    Co-evaluation              AM-PAC PT "6 Clicks" Mobility   Outcome Measure  Help needed turning from your back to your side while in a flat bed without using bedrails?: None Help needed moving from lying on your back to sitting on the side of a flat bed without using bedrails?: None Help needed moving to and from a bed to a chair (including a wheelchair)?: None Help needed standing up from a chair using your arms (e.g., wheelchair or bedside chair)?: A Little Help needed to walk in hospital room?: A Little Help needed climbing 3-5 steps with a railing? : A Little 6 Click Score: 21    End of Session Equipment Utilized During Treatment: Gait belt Activity Tolerance: Patient tolerated treatment well Patient left: Other (comment)(in restroom) Nurse Communication: Mobility status PT Visit Diagnosis: Other abnormalities of gait and mobility (R26.89)     Time: [id number] PT Time Calculation (min) (ACUTE ONLY): 24 min  Charges:  $Gait Training: 8-22 mins $Neuromuscular Re-education: 8-22 mins                     Mallory Robinson, [place] Pager [phone] Acute Rehab  Mallory Robinson 04/11/2019, 12:27 PM

## 2019-04-11 NOTE — Progress Notes (Signed)
PROGRESS NOTE    Mallory Robinson  MEQ:[id number] DOB: [birth year] DOA: 04/09/2019 PCP: Nolene Ebbs, MD   Brief Narrative:  HPI per Dr. Leron Croak is a 44 y.o. female with medical history significant for bipolar disorder, migraines, menorrhagia, hypertension, and stroke with left-sided weakness, now presenting to the ED for evaluation of left-sided numbness and weakness as well as visual disturbance involving the right eye.  She had been in her usual state of health until approximately 3 PM when she experienced worsening in her chronic left-sided weakness, left-sided numbness, and slurred speech.  This was associated with dull ache behind the right eye and zigzag lines that resolved when she closed her right eye.  She notes that the symptoms are very similar to what she has experienced previously.  There was no preceding trauma.  She developed fevers, chills, and respiratory symptoms in early December, was diagnosed with COVID-19, and reports making a full recovery from that.  She continues to experience menorrhagia, saw OB/GYN for this, was diagnosed with uterine fibroids, and is considering surgery for this.  She had been on iron supplementation but stopped taking her iron pills regularly due to constipation.  ED Course: Upon arrival to the ED, patient is found to be afebrile, saturating well on room air, and with blood pressure 163/96.  EKG features sinus rhythm and noncontrast head CT is negative for acute intracranial abnormality but notable for scattered right MCA territory encephalomalacia that appears stable.  Chemistry panel was unremarkable and CBC notable for leukocytosis to 13,100, microcytic anemia with hemoglobin 6.7, and thrombocytosis with platelets 1,072,000.  UDS was negative, urinalysis unremarkable, and ethanol undetectable.  CTA head and neck and CT perfusion study is negative for emergent large vessel occlusion and stable subtle round hypodense area at the right carotid  bifurcation, stable carotid tortuosity, and negative perfusion study.  Patient was given a liter of lactated Ringer's, Benadryl, Compazine, and 1 unit of packed red blood cells in the ED.  Neurology was consulted by the ED physician and recommended MRI brain and medical admission.    Assessment & Plan:   Principal Problem:   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HCC) Active Problems:   Bipolar I disorder (HCC)   Microcytic anemia   Thrombocytosis (HCC)   Chronic migraine   Hypertension   Stroke-like episode   Folate deficiency   Low vitamin B12 level  1 acute ischemic right MCA stroke, probably embolic Questionable etiology.  Patient had presented with acute worsening of her left-sided weakness and right eye visual disturbance.  Head CT done on admission with no acute abnormalities.  Stable chronic findings noted on CT angiogram head and neck.  Unchanged small 3 mm focus of hypodensity in the right ICA bulb, now favored to be a small web or chronic soft plaque rather than adherent thrombus noted on CT angiogram head and neck.  Patient improving clinically.  Patient noted to be taking the aspirin chronically at home and not on a statin as recent LDL of 32.  Patient seen in consultation by neurology and stroke work-up underway.  2D echo with normal EF with no source of emboli.  Transcranial Doppler bubble study negative.  Lower extremity Dopplers negative for DVT.  Patient likely needs a TEE to rule out embolic source as MRI with multiple punctuate foci of acute ischemia.  Continue aspirin 81 mg daily for secondary stroke prophylaxis.  Neurology consulted and are following.  2.  Microcytic anemia, thrombocytosis/folate deficiency Patient noted to  have a hemoglobin of 6.7 on presentation to the ED with MCV of 62.1 and a platelet count of 1072.  Patient noted to have iron deficiency anemia that she attributed to menorrhagia and is being followed by OB/GYN and diagnosed as uterine fibroids and  considering surgery for this.  Patient noted to have evidence of iron deficiency anemia in 2019, iron studies normalized after taking oral iron supplementations but patient has been off iron supplementations due to constipation.  Patient status post 1 unit packed red blood cells in the ED hemoglobin currently at 7.9.  Thrombocytosis could be secondary to iron deficiency anemia however patient now with acute right MCA CVA likely embolic in nature.  Anemia panel consistent with severe iron deficiency anemia with iron level of 16, ferritin of 2, TIBC 550, folate of 4.3.  Sed rate of 3.  CRP of 0.5.  Give a dose of IV Feraheme x1.  Hemoglobin currently stable at 8.0.  Hematology has been consulted and are following and appreciate their input and recommendations.  Patient could likely be tried back on oral iron supplementation on discharge with Senokot-S as a stool softener due to significant constipation on oral iron.  Follow H&H.    3.  Chronic migraines Frequency and severity improved on daily propranolol which was resumed on 04/10/2019.   4.  Hypertension Permissive hypertension secondary to problem #1.  Continue propranolol.   5.  Recent COVID-19 Patient had a positive COVID-19 test on 01/24/2019 and experienced fever and respiratory symptoms that have completely resolved.  Per infection prevention department recommends not retesting or instituting special precautions if positive test was > 21 days but < 90 days.  Supportive care.   DVT prophylaxis: Lovenox Code Status: Full Family Communication: Updated patient.  No family at bedside. Disposition Plan:  . Patient came from: Home            . Anticipated d/c place: Home . Barriers to d/c OR conditions which need to be met to effect a safe d/c: Home once stroke work-up is completed and cleared by neurology.   Consultants:   Neurology: Dr. Cheral Marker 04/09/2019  Procedures:   CT angiogram head and neck 04/09/2019  CT head  04/09/2019  Transcranial Doppler 04/10/2019  Lower extremity Doppler 04/10/2019  2D echo 04/10/2019  MRI head 04/10/2019  Transfuse 1 unit packed red blood cells in the ED 04/09/2019  Antimicrobials:   None   Subjective: Patient laying in bed.  Feeling better.  States left upper extremity strength has improved and close to her baseline.  Only complains of some blurry vision in the right peripheral visual field.  No chest pain or shortness of breath.  Was hoping to be able to go home today.   Objective: Vitals:   04/10/19 1952 04/11/19 0007 04/11/19 0042 04/11/19 0719  BP: (!) 145/93 (!) 178/109 (!) 159/96 (!) 129/92  Pulse: 72 70 61 64  Resp: _0 Temp: 99.1 F (37.3 C) 100.2 F (37.9 C) 99.2 F (37.3 C) 98.5 F (36.9 C)  TempSrc: Oral Oral Oral Oral  SpO2: 99% 100% 100% 100%  Weight:      Height:        Intake/Output Summary (Last 24 hours) at 04/11/2019 1112 Last data filed at 04/10/2019 1650 Gross per 24 hour  Intake 0 ml  Output --  Net 0 ml   Filed Weights   04/10/19 1617  Weight: 77.4 kg    Examination:  General exam: NAD. Respiratory system:  Lungs clear to auscultation bilaterally.  No wheezes, no crackles, no rhonchi.  Normal respiratory effort.  Cardiovascular system: Regular rate and rhythm no murmurs rubs or gallops.  No JVD.  No lower extremity edema.  Gastrointestinal system: Abdomen is soft, nontender, nondistended, positive bowel sounds.  No rebound.  No guarding.  Central nervous system: Alert and oriented.  Cranial nerves II through XII grossly intact.  Sensation intact.  4/5 left upper extremity, 5/5 right upper extremity strength and left lower extremity strength.  Gait not tested secondary to safety. Extremities: 4/5 left upper extremity, 5/5 right upper extremity strength and left lower extremity strength.  Skin: No rashes, lesions or ulcers Psychiatry: Judgement and insight appear normal. Mood & affect appropriate.     Data Reviewed:  I have personally reviewed following labs and imaging studies  CBC: Recent Labs  Lab 04/09/19 1954 04/09/19 2020 04/10/19 0400 04/11/19 0351  WBC 13.1*  --  13.5* 10.1  NEUTROABS 10.9*  --  10.9* 7.3  HGB 6.7* 9.2* 7.9* 8.0*  HCT 25.7* 27.0* 28.4* 27.7*  MCV 62.1*  --  65.3* 63.7*  PLT 1,072*  --  956* 161*   Basic Metabolic Panel: Recent Labs  Lab 04/09/19 1954 04/09/19 2020 04/11/19 0351 04/11/19 0931  NA 138 139 137  --   K 4.0 3.9 3.3*  --   CL 107 107 103  --   CO2 23  --  23  --   GLUCOSE 113* 111* 101*  --   BUN _0 --   CREATININE 0.93 0.70 0.90  --   CALCIUM 9.0  --  9.0  --   MG  --   --   --  2.0   GFR: Estimated Creatinine Clearance: 78.6 mL/min (by C-G formula based on SCr of 0.9 mg/dL). Liver Function Tests: Recent Labs  Lab 04/09/19 1954  AST 20  ALT 12  ALKPHOS 64  BILITOT 0.5  PROT 7.2  ALBUMIN 3.9   No results for input(s): LIPASE, AMYLASE in the last 168 hours. No results for input(s): AMMONIA in the last 168 hours. Coagulation Profile: Recent Labs  Lab 04/09/19 1954  INR 1.2   Cardiac Enzymes: No results for input(s): CKTOTAL, CKMB, CKMBINDEX, TROPONINI in the last 168 hours. BNP (last 3 results) No results for input(s): PROBNP in the last 8760 hours. HbA1C: Recent Labs    04/10/19 0400  HGBA1C 5.5   CBG: No results for input(s): GLUCAP in the last 168 hours. Lipid Profile: Recent Labs    04/10/19 0400  CHOL 76  HDL 33*  LDLCALC 27  TRIG 78  CHOLHDL 2.3   Thyroid Function Tests: No results for input(s): TSH, T4TOTAL, FREET4, T3FREE, THYROIDAB in the last 72 hours. Anemia Panel: Recent Labs    04/10/19 1625  VITAMINB12 181  FOLATE 4.3*  FERRITIN 2*  TIBC 550*  IRON 16*   Sepsis Labs: No results for input(s): PROCALCITON, LATICACIDVEN in the last 168 hours.  No results found for this or any previous visit (from the past 240 hour(s)).       Radiology Studies: CT Code Stroke CTA Head W/WO  contrast  Result Date: 04/09/2019 CLINICAL DATA:  44 year old female with left side numbness. 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 EXAM: CT ANGIOGRAPHY HEAD AND NECK CT PERFUSION BRAIN TECHNIQUE: Multidetector CT imaging of the head and neck was performed using the standard protocol during bolus  administration of intravenous contrast. Multiplanar CT image reconstructions and MIPs were obtained to evaluate the vascular anatomy. Carotid stenosis measurements (when applicable) are obtained utilizing NASCET criteria, using the distal internal carotid diameter as the denominator. Multiphase CT imaging of the brain was performed following IV bolus contrast injection. Subsequent parametric perfusion maps were calculated using RAPID software. CONTRAST:  135m OMNIPAQUE IOHEXOL 350 MG/ML SOLN COMPARISON:  Plain head CT 2033 hours today. FINDINGS: CT Brain Perfusion Findings: ASPECTS: 10 CBF (<30%) Volume: None Perfusion (Tmax>6.0s) volume: None Mismatch Volume: Not applicable Infarction Location:Not applicable CTA NECK Skeleton: No acute osseous abnormality identified. Upper chest: Negative. Other neck: Negative. Aortic arch: 3 vessel arch configuration with no arch or proximal great vessel atherosclerosis. Right carotid system: Negative right CCA. At the right carotid bifurcation there is a subtle persistent small round hypodense area at the posterior bulb measuring about 3 mm (series 7, image 86). This appears unchanged since November. Otherwise the right bifurcation is widely patent. Negative cervical right ICA otherwise, mild tortuosity distal to the bulb. Left carotid system: Stable and negative aside from tortuosity. Vertebral arteries: Proximal right subclavian artery and right vertebral artery origin are normal. The right vertebral artery is patent to the skull base with tortuosity but no plaque or stenosis. Proximal left subclavian  artery and left vertebral artery origin are normal aside from tortuosity. The left vertebral appears mildly dominant, with V2 segment tortuosity but no plaque or stenosis to the skull base. CTA HEAD Posterior circulation: Tortuous distal vertebral arteries with no plaque or stenosis. Patent PICA origins and vertebrobasilar junction. Tortuous basilar artery is patent without stenosis. Basilar tip, SCA and PCA origins appear stable and within normal limits. Posterior communicating arteries are diminutive or absent. Bilateral PCA branches are stable and within normal limits. Anterior circulation: Both ICA siphons are patent. There is tortuosity and mild irregularity of the left siphon with no discrete plaque or stenosis. This is stable. Right siphon is tortuous without plaque or stenosis. Patent carotid termini. MCA and ACA origins remain normal. Mildly dominant right A1 segment. Tortuous A1s. Anterior communicating artery and bilateral ACA branches are within normal limits. Left MCA M1 segment and bifurcation are patent without stenosis. Left MCA branches are stable. Right MCA M1 segment and bifurcation are patent without stenosis. Right MCA branches are stable. Venous sinuses: Stable patency since November. Dominant appearing right transverse and sigmoid sinuses, with some effacement of those bilateral sinuses. Anatomic variants: Mildly dominant left vertebral artery, right ACA A1 segment. Review of the MIP images confirms the above findings IMPRESSION: 1. Negative for emergent large vessel occlusion, negative CTP, and stable CTA appearance of the head and neck since November. 2. Unchanged small 3 mm focus of hypodensity in the right ICA bulb, now favored to be a small web or chronic soft plaque rather than adherent thrombus. 3. No other atherosclerosis identified. No significant arterial stenosis. These results were communicated to Dr. LCheral Markerat 10:20 pm on 04/09/2019 by text page via the [facility]aging system.  Electronically Signed   By: HGenevie AnnM.D.   On: 04/09/2019 22:24   CT Code Stroke CTA Neck W/WO contrast  Result Date: 04/09/2019 CLINICAL DATA:  44year old female with left side numbness. 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 EXAM: CT ANGIOGRAPHY HEAD AND NECK CT PERFUSION BRAIN TECHNIQUE: Multidetector CT imaging of the head and neck was performed using the standard protocol during bolus administration of intravenous  contrast. Multiplanar CT image reconstructions and MIPs were obtained to evaluate the vascular anatomy. Carotid stenosis measurements (when applicable) are obtained utilizing NASCET criteria, using the distal internal carotid diameter as the denominator. Multiphase CT imaging of the brain was performed following IV bolus contrast injection. Subsequent parametric perfusion maps were calculated using RAPID software. CONTRAST:  178m OMNIPAQUE IOHEXOL 350 MG/ML SOLN COMPARISON:  Plain head CT 2033 hours today. FINDINGS: CT Brain Perfusion Findings: ASPECTS: 10 CBF (<30%) Volume: None Perfusion (Tmax>6.0s) volume: None Mismatch Volume: Not applicable Infarction Location:Not applicable CTA NECK Skeleton: No acute osseous abnormality identified. Upper chest: Negative. Other neck: Negative. Aortic arch: 3 vessel arch configuration with no arch or proximal great vessel atherosclerosis. Right carotid system: Negative right CCA. At the right carotid bifurcation there is a subtle persistent small round hypodense area at the posterior bulb measuring about 3 mm (series 7, image 86). This appears unchanged since November. Otherwise the right bifurcation is widely patent. Negative cervical right ICA otherwise, mild tortuosity distal to the bulb. Left carotid system: Stable and negative aside from tortuosity. Vertebral arteries: Proximal right subclavian artery and right vertebral artery origin are normal. The right vertebral artery  is patent to the skull base with tortuosity but no plaque or stenosis. Proximal left subclavian artery and left vertebral artery origin are normal aside from tortuosity. The left vertebral appears mildly dominant, with V2 segment tortuosity but no plaque or stenosis to the skull base. CTA HEAD Posterior circulation: Tortuous distal vertebral arteries with no plaque or stenosis. Patent PICA origins and vertebrobasilar junction. Tortuous basilar artery is patent without stenosis. Basilar tip, SCA and PCA origins appear stable and within normal limits. Posterior communicating arteries are diminutive or absent. Bilateral PCA branches are stable and within normal limits. Anterior circulation: Both ICA siphons are patent. There is tortuosity and mild irregularity of the left siphon with no discrete plaque or stenosis. This is stable. Right siphon is tortuous without plaque or stenosis. Patent carotid termini. MCA and ACA origins remain normal. Mildly dominant right A1 segment. Tortuous A1s. Anterior communicating artery and bilateral ACA branches are within normal limits. Left MCA M1 segment and bifurcation are patent without stenosis. Left MCA branches are stable. Right MCA M1 segment and bifurcation are patent without stenosis. Right MCA branches are stable. Venous sinuses: Stable patency since November. Dominant appearing right transverse and sigmoid sinuses, with some effacement of those bilateral sinuses. Anatomic variants: Mildly dominant left vertebral artery, right ACA A1 segment. Review of the MIP images confirms the above findings IMPRESSION: 1. Negative for emergent large vessel occlusion, negative CTP, and stable CTA appearance of the head and neck since November. 2. Unchanged small 3 mm focus of hypodensity in the right ICA bulb, now favored to be a small web or chronic soft plaque rather than adherent thrombus. 3. No other atherosclerosis identified. No significant arterial stenosis. These results were  communicated to Dr. LCheral Markerat 10:20 pm on 04/09/2019 by text page via the [facility]messaging system. Electronically Signed   By: HGenevie AnnM.D.   On: 04/09/2019 22:24   MR BRAIN WO CONTRAST  Result Date: 04/10/2019 CLINICAL DATA:  Left-sided numbness and weakness EXAM: MRI HEAD WITHOUT CONTRAST TECHNIQUE: Multiplanar, multiecho pulse sequences of the brain and surrounding structures were obtained without intravenous contrast. COMPARISON:  Brain MRI 12/27/2018 FINDINGS: BRAIN: There are scattered punctate foci of abnormal diffusion restriction within the right MCA territory. No contralateral diffusion abnormality. There is an old posterior right parietal infarct. Multifocal white  matter hyperintensity, most commonly due to chronic ischemic microangiopathy. Normal volume of brain parenchyma and CSF spaces. Midline structures are normal. VASCULAR: Major flow voids are preserved. Unchanged hemosiderin deposition within the posterior right hemisphere. SKULL AND UPPER CERVICAL SPINE: Normal calvarium and skull base. Visualized upper cervical spine and soft tissues are normal. SINUSES/ORBITS: No fluid levels or advanced mucosal thickening. No mastoid or middle ear effusion. Normal orbits. IMPRESSION: 1. Multiple punctate foci of acute ischemia within the right MCA territory. No hemorrhage or mass effect. 2. Old posterior right parietal infarct and findings of mild chronic ischemic microangiopathy. Electronically Signed   By: Ulyses Jarred M.D.   On: 04/10/2019 02:43   CT Code Stroke Cerebral Perfusion with contrast  Result Date: 04/09/2019 CLINICAL DATA:  44 year old female with left side numbness. 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 EXAM: CT ANGIOGRAPHY HEAD AND NECK CT PERFUSION BRAIN TECHNIQUE: Multidetector CT imaging of the head and neck was performed using the standard protocol during bolus administration of intravenous contrast.  Multiplanar CT image reconstructions and MIPs were obtained to evaluate the vascular anatomy. Carotid stenosis measurements (when applicable) are obtained utilizing NASCET criteria, using the distal internal carotid diameter as the denominator. Multiphase CT imaging of the brain was performed following IV bolus contrast injection. Subsequent parametric perfusion maps were calculated using RAPID software. CONTRAST:  137m OMNIPAQUE IOHEXOL 350 MG/ML SOLN COMPARISON:  Plain head CT 2033 hours today. FINDINGS: CT Brain Perfusion Findings: ASPECTS: 10 CBF (<30%) Volume: None Perfusion (Tmax>6.0s) volume: None Mismatch Volume: Not applicable Infarction Location:Not applicable CTA NECK Skeleton: No acute osseous abnormality identified. Upper chest: Negative. Other neck: Negative. Aortic arch: 3 vessel arch configuration with no arch or proximal great vessel atherosclerosis. Right carotid system: Negative right CCA. At the right carotid bifurcation there is a subtle persistent small round hypodense area at the posterior bulb measuring about 3 mm (series 7, image 86). This appears unchanged since November. Otherwise the right bifurcation is widely patent. Negative cervical right ICA otherwise, mild tortuosity distal to the bulb. Left carotid system: Stable and negative aside from tortuosity. Vertebral arteries: Proximal right subclavian artery and right vertebral artery origin are normal. The right vertebral artery is patent to the skull base with tortuosity but no plaque or stenosis. Proximal left subclavian artery and left vertebral artery origin are normal aside from tortuosity. The left vertebral appears mildly dominant, with V2 segment tortuosity but no plaque or stenosis to the skull base. CTA HEAD Posterior circulation: Tortuous distal vertebral arteries with no plaque or stenosis. Patent PICA origins and vertebrobasilar junction. Tortuous basilar artery is patent without stenosis. Basilar tip, SCA and PCA origins  appear stable and within normal limits. Posterior communicating arteries are diminutive or absent. Bilateral PCA branches are stable and within normal limits. Anterior circulation: Both ICA siphons are patent. There is tortuosity and mild irregularity of the left siphon with no discrete plaque or stenosis. This is stable. Right siphon is tortuous without plaque or stenosis. Patent carotid termini. MCA and ACA origins remain normal. Mildly dominant right A1 segment. Tortuous A1s. Anterior communicating artery and bilateral ACA branches are within normal limits. Left MCA M1 segment and bifurcation are patent without stenosis. Left MCA branches are stable. Right MCA M1 segment and bifurcation are patent without stenosis. Right MCA branches are stable. Venous sinuses: Stable patency since November. Dominant appearing right transverse and sigmoid sinuses, with some effacement of those bilateral sinuses.  Anatomic variants: Mildly dominant left vertebral artery, right ACA A1 segment. Review of the MIP images confirms the above findings IMPRESSION: 1. Negative for emergent large vessel occlusion, negative CTP, and stable CTA appearance of the head and neck since November. 2. Unchanged small 3 mm focus of hypodensity in the right ICA bulb, now favored to be a small web or chronic soft plaque rather than adherent thrombus. 3. No other atherosclerosis identified. No significant arterial stenosis. These results were communicated to Dr. Cheral Marker at 10:20 pm on 04/09/2019 by text page via the [facility] messaging system. Electronically Signed   By: Genevie Ann M.D.   On: 04/09/2019 22:24   VAS [place] TRANSCRANIAL DOPPLER W BUBBLES  Result Date: 04/10/2019  Transcranial Doppler with Bubble Indications: Stroke. Comparison Study: No prior study Performing Technologist: Maudry Mayhew MHA, RDMS, RVT, RDCS  Examination Guidelines: A complete evaluation includes B-mode imaging, spectral Doppler, color Doppler, and power Doppler as needed of  all accessible portions of each vessel. Bilateral testing is considered an integral part of a complete examination. Limited examinations for reoccurring indications may be performed as noted.  Summary:  A vascular evaluation was performed. The right middle cerebral artery was studied. An IV was inserted into the patient's right forearm. Verbal informed consent was obtained.  No evidence of HITS (high intensity transient signals) at rest or with Valsalva maneuver. Therefore, there is no evidence of clinically significant PFO (patent foramen ovale). *See table(s) above for TCD measurements and observations.    Preliminary    CT HEAD CODE STROKE WO CONTRAST  Result Date: 04/09/2019 CLINICAL DATA:  Code stroke. 44 year old female with left side numbness. 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 EXAM: CT HEAD WITHOUT CONTRAST TECHNIQUE: Contiguous axial images were obtained from the base of the skull through the vertex without intravenous contrast. COMPARISON:  Head CT without contrast 12/28/2018. Brain MRI and intracranial MRA, CTA head and neck 12/27/2018. 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 No midline shift, ventriculomegaly, mass effect, evidence of mass lesion, intracranial hemorrhage or evidence of cortically based acute infarction. Gray-white matter differentiation in the left hemisphere and posterior fossa remains normal. Vascular: No suspicious intracranial vascular hyperdensity. Skull: No acute osseous abnormality identified. Sinuses/Orbits: Visualized paranasal sinuses and mastoids are stable and well pneumatized. Other: Visualized orbits and scalp soft tissues are within normal limits. ASPECTS [facility] Stroke Program Early CT Score) Total score (0-10 with 10 being normal): 10 IMPRESSION: 1. No acute cortically based infarct or acute  intracranial hemorrhage identified. 2. 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 ASPECTS 10. 3. These results were communicated to Dr. Cheral Marker at 8:45 pm on 04/09/2019 by text page via the [facility] messaging system. Electronically Signed   By: Genevie Ann M.D.   On: 04/09/2019 20:45   VAS [place] LOWER EXTREMITY VENOUS (DVT)  Result Date: 04/10/2019  Lower Venous DVTStudy Indications: Stroke.  Comparison Study: No prior study Performing Technologist: Maudry Mayhew MHA, RDMS, RVT, RDCS  Examination Guidelines: A complete evaluation includes B-mode imaging, spectral Doppler, color Doppler, and power Doppler as needed of all accessible portions of each vessel. Bilateral testing is considered an integral part of a complete examination. Limited examinations for reoccurring indications may be performed as noted. The reflux portion of the exam is performed with the patient in reverse Trendelenburg.  +---------+---------------+---------+-----------+----------+--------------+ RIGHT  CompressibilityPhasicitySpontaneityPropertiesThrombus Aging +---------+---------------+---------+-----------+----------+--------------+ CFV      Full           Yes      Yes                                 +---------+---------------+---------+-----------+----------+--------------+ SFJ      Full                                                        +---------+---------------+---------+-----------+----------+--------------+ FV Prox  Full                                                        +---------+---------------+---------+-----------+----------+--------------+ FV Mid   Full                                                        +---------+---------------+---------+-----------+----------+--------------+ FV DistalFull                                                        +---------+---------------+---------+-----------+----------+--------------+ PFV      Full                                                         +---------+---------------+---------+-----------+----------+--------------+ POP      Full           Yes      Yes                                 +---------+---------------+---------+-----------+----------+--------------+ PTV      Full                                                        +---------+---------------+---------+-----------+----------+--------------+ PERO     Full                                                        +---------+---------------+---------+-----------+----------+--------------+   +---------+---------------+---------+-----------+----------+--------------+ LEFT     CompressibilityPhasicitySpontaneityPropertiesThrombus Aging +---------+---------------+---------+-----------+----------+--------------+ CFV      Full           Yes      Yes                                 +---------+---------------+---------+-----------+----------+--------------+  SFJ      Full                                                        +---------+---------------+---------+-----------+----------+--------------+ FV Prox  Full                                                        +---------+---------------+---------+-----------+----------+--------------+ FV Mid   Full                                                        +---------+---------------+---------+-----------+----------+--------------+ FV DistalFull                                                        +---------+---------------+---------+-----------+----------+--------------+ PFV      Full                                                        +---------+---------------+---------+-----------+----------+--------------+ POP      Full           Yes      Yes                                 +---------+---------------+---------+-----------+----------+--------------+ PTV      Full                                                         +---------+---------------+---------+-----------+----------+--------------+ PERO     Full                                                        +---------+---------------+---------+-----------+----------+--------------+     Summary: RIGHT: - There is no evidence of deep vein thrombosis in the lower extremity.  - No cystic structure found in the popliteal fossa.  LEFT: - There is no evidence of deep vein thrombosis in the lower extremity.  - No cystic structure found in the popliteal fossa.  *See table(s) above for measurements and observations. Electronically signed by Deitra Mayo MD on 04/10/2019 at 1:57:33 PM.    Final         Scheduled Meds: .  stroke: mapping our early stages of recovery book   Does not apply Once  . aspirin EC  81 mg Oral Daily  .  cyanocobalamin  1,000 mcg Subcutaneous Daily  . enoxaparin (LOVENOX) injection  40 mg Subcutaneous Q24H  . folic acid  1 mg Oral Daily  . propranolol  20 mg Oral BID  . senna-docusate  1 tablet Oral BID   Continuous Infusions: . sodium chloride       LOS: 1 day    Time spent: 40 minutes    Irine Seal, MD [facility]   To contact the attending provider between 7A-7P or the covering provider during after hours 7P-7A, please log into the web site [email or web address] and access using universal [place] password for that web site. If you do not have the password, please call the hospital operator.  04/11/2019, 11:12 AM

## 2019-04-11 NOTE — Progress Notes (Signed)
STROKE TEAM PROGRESS NOTE   INTERVAL HISTORY Pt lying in bed, stated that her symptoms has resolved. No complains. Discussed with pt about recurrent right carotid thrombus, as well as her anemia with thrombocytosis, potentially related. Currently oncology is on board for work up.    Vitals:   04/11/19 0007 04/11/19 0042 04/11/19 0719 04/11/19 1145  BP: (!) 178/109 (!) 159/96 (!) 129/92 (!) 162/96  Pulse: 70 61 64 60  Resp: 17 18 18 18   Temp: 100.2 F (37.9 C) 99.2 F (37.3 C) 98.5 F (36.9 C) 98.6 F (37 C)  TempSrc: Oral Oral Oral Oral  SpO2: 100% 100% 100% 100%  Weight:      Height:        CBC:  Recent Labs  Lab 04/10/19 0400 04/11/19 0351  WBC 13.5* 10.1  NEUTROABS 10.9* 7.3  HGB 7.9* 8.0*  HCT 28.4* 27.7*  MCV 65.3* 63.7*  PLT 956* 845*    Basic Metabolic Panel:  Recent Labs  Lab 04/09/19 1954 04/09/19 1954 04/09/19 2020 04/11/19 0351 04/11/19 0931  NA 138   < > 139 137  --   K 4.0   < > 3.9 3.3*  --   CL 107   < > 107 103  --   CO2 23  --   --  23  --   GLUCOSE 113*   < > 111* 101*  --   BUN 6   < > 7 6  --   CREATININE 0.93   < > 0.70 0.90  --   CALCIUM 9.0  --   --  9.0  --   MG  --   --   --   --  2.0   < > = values in this interval not displayed.   Lipid Panel:     Component Value Date/Time   CHOL 76 04/10/2019 0400   TRIG 78 04/10/2019 0400   HDL 33 (L) 04/10/2019 0400   CHOLHDL 2.3 04/10/2019 0400   VLDL 16 04/10/2019 0400   LDLCALC 27 04/10/2019 0400   HgbA1c:  Lab Results  Component Value Date   HGBA1C 5.5 04/10/2019   Urine Drug Screen:     Component Value Date/Time   LABOPIA NONE DETECTED 04/09/2019 2005   COCAINSCRNUR NONE DETECTED 04/09/2019 2005   LABBENZ NONE DETECTED 04/09/2019 2005   AMPHETMU NONE DETECTED 04/09/2019 2005   THCU NONE DETECTED 04/09/2019 2005   LABBARB NONE DETECTED 04/09/2019 2005    Alcohol Level     Component Value Date/Time   ETH <10 04/09/2019 1954    IMAGING past 48 hours CT Code Stroke CTA  Head W/WO contrast  Result Date: 04/09/2019 CLINICAL DATA:  44 year old female with left side numbness. 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 EXAM: CT ANGIOGRAPHY HEAD AND NECK CT PERFUSION BRAIN TECHNIQUE: Multidetector CT imaging of the head and neck was performed using the standard protocol during bolus administration of intravenous contrast. Multiplanar CT image reconstructions and MIPs were obtained to evaluate the vascular anatomy. Carotid stenosis measurements (when applicable) are obtained utilizing NASCET criteria, using the distal internal carotid diameter as the denominator. Multiphase CT imaging of the brain was performed following IV bolus contrast injection. Subsequent parametric perfusion maps were calculated using RAPID software. CONTRAST:  147mL OMNIPAQUE IOHEXOL 350 MG/ML SOLN COMPARISON:  Plain head CT 2033 hours today. FINDINGS: CT Brain Perfusion Findings: ASPECTS: 10 CBF (<30%) Volume: None Perfusion (Tmax>6.0s)  volume: None Mismatch Volume: Not applicable Infarction Location:Not applicable CTA NECK Skeleton: No acute osseous abnormality identified. Upper chest: Negative. Other neck: Negative. Aortic arch: 3 vessel arch configuration with no arch or proximal great vessel atherosclerosis. Right carotid system: Negative right CCA. At the right carotid bifurcation there is a subtle persistent small round hypodense area at the posterior bulb measuring about 3 mm (series 7, image 86). This appears unchanged since November. Otherwise the right bifurcation is widely patent. Negative cervical right ICA otherwise, mild tortuosity distal to the bulb. Left carotid system: Stable and negative aside from tortuosity. Vertebral arteries: Proximal right subclavian artery and right vertebral artery origin are normal. The right vertebral artery is patent to the skull base with tortuosity but no plaque or stenosis. Proximal left  subclavian artery and left vertebral artery origin are normal aside from tortuosity. The left vertebral appears mildly dominant, with V2 segment tortuosity but no plaque or stenosis to the skull base. CTA HEAD Posterior circulation: Tortuous distal vertebral arteries with no plaque or stenosis. Patent PICA origins and vertebrobasilar junction. Tortuous basilar artery is patent without stenosis. Basilar tip, SCA and PCA origins appear stable and within normal limits. Posterior communicating arteries are diminutive or absent. Bilateral PCA branches are stable and within normal limits. Anterior circulation: Both ICA siphons are patent. There is tortuosity and mild irregularity of the left siphon with no discrete plaque or stenosis. This is stable. Right siphon is tortuous without plaque or stenosis. Patent carotid termini. MCA and ACA origins remain normal. Mildly dominant right A1 segment. Tortuous A1s. Anterior communicating artery and bilateral ACA branches are within normal limits. Left MCA M1 segment and bifurcation are patent without stenosis. Left MCA branches are stable. Right MCA M1 segment and bifurcation are patent without stenosis. Right MCA branches are stable. Venous sinuses: Stable patency since November. Dominant appearing right transverse and sigmoid sinuses, with some effacement of those bilateral sinuses. Anatomic variants: Mildly dominant left vertebral artery, right ACA A1 segment. Review of the MIP images confirms the above findings IMPRESSION: 1. Negative for emergent large vessel occlusion, negative CTP, and stable CTA appearance of the head and neck since November. 2. Unchanged small 3 mm focus of hypodensity in the right ICA bulb, now favored to be a small web or chronic soft plaque rather than adherent thrombus. 3. No other atherosclerosis identified. No significant arterial stenosis. These results were communicated to Dr. Cheral Marker at 10:20 pm on 04/09/2019 by text page via the [facility] messaging  system. Electronically Signed   By: Genevie Ann M.D.   On: 04/09/2019 22:24   CT Code Stroke CTA Neck W/WO contrast  Result Date: 04/09/2019 CLINICAL DATA:  44 year old female with left side numbness. 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 EXAM: CT ANGIOGRAPHY HEAD AND NECK CT PERFUSION BRAIN TECHNIQUE: Multidetector CT imaging of the head and neck was performed using the standard protocol during bolus administration of intravenous contrast. Multiplanar CT image reconstructions and MIPs were obtained to evaluate the vascular anatomy. Carotid stenosis measurements (when applicable) are obtained utilizing NASCET criteria, using the distal internal carotid diameter as the denominator. Multiphase CT imaging of the brain was performed following IV bolus contrast injection. Subsequent parametric perfusion maps were calculated using RAPID software. CONTRAST:  151mL OMNIPAQUE IOHEXOL 350 MG/ML SOLN COMPARISON:  Plain head CT 2033 hours today. FINDINGS: CT Brain Perfusion Findings: ASPECTS: 10 CBF (<30%) Volume: None Perfusion (Tmax>6.0s) volume: None Mismatch  Volume: Not applicable Infarction Location:Not applicable CTA NECK Skeleton: No acute osseous abnormality identified. Upper chest: Negative. Other neck: Negative. Aortic arch: 3 vessel arch configuration with no arch or proximal great vessel atherosclerosis. Right carotid system: Negative right CCA. At the right carotid bifurcation there is a subtle persistent small round hypodense area at the posterior bulb measuring about 3 mm (series 7, image 86). This appears unchanged since November. Otherwise the right bifurcation is widely patent. Negative cervical right ICA otherwise, mild tortuosity distal to the bulb. Left carotid system: Stable and negative aside from tortuosity. Vertebral arteries: Proximal right subclavian artery and right vertebral artery origin are normal. The right vertebral  artery is patent to the skull base with tortuosity but no plaque or stenosis. Proximal left subclavian artery and left vertebral artery origin are normal aside from tortuosity. The left vertebral appears mildly dominant, with V2 segment tortuosity but no plaque or stenosis to the skull base. CTA HEAD Posterior circulation: Tortuous distal vertebral arteries with no plaque or stenosis. Patent PICA origins and vertebrobasilar junction. Tortuous basilar artery is patent without stenosis. Basilar tip, SCA and PCA origins appear stable and within normal limits. Posterior communicating arteries are diminutive or absent. Bilateral PCA branches are stable and within normal limits. Anterior circulation: Both ICA siphons are patent. There is tortuosity and mild irregularity of the left siphon with no discrete plaque or stenosis. This is stable. Right siphon is tortuous without plaque or stenosis. Patent carotid termini. MCA and ACA origins remain normal. Mildly dominant right A1 segment. Tortuous A1s. Anterior communicating artery and bilateral ACA branches are within normal limits. Left MCA M1 segment and bifurcation are patent without stenosis. Left MCA branches are stable. Right MCA M1 segment and bifurcation are patent without stenosis. Right MCA branches are stable. Venous sinuses: Stable patency since November. Dominant appearing right transverse and sigmoid sinuses, with some effacement of those bilateral sinuses. Anatomic variants: Mildly dominant left vertebral artery, right ACA A1 segment. Review of the MIP images confirms the above findings IMPRESSION: 1. Negative for emergent large vessel occlusion, negative CTP, and stable CTA appearance of the head and neck since November. 2. Unchanged small 3 mm focus of hypodensity in the right ICA bulb, now favored to be a small web or chronic soft plaque rather than adherent thrombus. 3. No other atherosclerosis identified. No significant arterial stenosis. These results  were communicated to Dr. Cheral Marker at 10:20 pm on 04/09/2019 by text page via the [facility] messaging system. Electronically Signed   By: Genevie Ann M.D.   On: 04/09/2019 22:24   MR BRAIN WO CONTRAST  Result Date: 04/10/2019 CLINICAL DATA:  Left-sided numbness and weakness EXAM: MRI HEAD WITHOUT CONTRAST TECHNIQUE: Multiplanar, multiecho pulse sequences of the brain and surrounding structures were obtained without intravenous contrast. COMPARISON:  Brain MRI 12/27/2018 FINDINGS: BRAIN: There are scattered punctate foci of abnormal diffusion restriction within the right MCA territory. No contralateral diffusion abnormality. There is an old posterior right parietal infarct. Multifocal white matter hyperintensity, most commonly due to chronic ischemic microangiopathy. Normal volume of brain parenchyma and CSF spaces. Midline structures are normal. VASCULAR: Major flow voids are preserved. Unchanged hemosiderin deposition within the posterior right hemisphere. SKULL AND UPPER CERVICAL SPINE: Normal calvarium and skull base. Visualized upper cervical spine and soft tissues are normal. SINUSES/ORBITS: No fluid levels or advanced mucosal thickening. No mastoid or middle ear effusion. Normal orbits. IMPRESSION: 1. Multiple punctate foci of acute ischemia within the right MCA territory. No hemorrhage or mass  effect. 2. Old posterior right parietal infarct and findings of mild chronic ischemic microangiopathy. Electronically Signed   By: Ulyses Jarred M.D.   On: 04/10/2019 02:43   CT Code Stroke Cerebral Perfusion with contrast  Result Date: 04/09/2019 CLINICAL DATA:  44 year old female with left side numbness. 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 EXAM: CT ANGIOGRAPHY HEAD AND NECK CT PERFUSION BRAIN TECHNIQUE: Multidetector CT imaging of the head and neck was performed using the standard protocol during bolus administration of intravenous  contrast. Multiplanar CT image reconstructions and MIPs were obtained to evaluate the vascular anatomy. Carotid stenosis measurements (when applicable) are obtained utilizing NASCET criteria, using the distal internal carotid diameter as the denominator. Multiphase CT imaging of the brain was performed following IV bolus contrast injection. Subsequent parametric perfusion maps were calculated using RAPID software. CONTRAST:  160mL OMNIPAQUE IOHEXOL 350 MG/ML SOLN COMPARISON:  Plain head CT 2033 hours today. FINDINGS: CT Brain Perfusion Findings: ASPECTS: 10 CBF (<30%) Volume: None Perfusion (Tmax>6.0s) volume: None Mismatch Volume: Not applicable Infarction Location:Not applicable CTA NECK Skeleton: No acute osseous abnormality identified. Upper chest: Negative. Other neck: Negative. Aortic arch: 3 vessel arch configuration with no arch or proximal great vessel atherosclerosis. Right carotid system: Negative right CCA. At the right carotid bifurcation there is a subtle persistent small round hypodense area at the posterior bulb measuring about 3 mm (series 7, image 86). This appears unchanged since November. Otherwise the right bifurcation is widely patent. Negative cervical right ICA otherwise, mild tortuosity distal to the bulb. Left carotid system: Stable and negative aside from tortuosity. Vertebral arteries: Proximal right subclavian artery and right vertebral artery origin are normal. The right vertebral artery is patent to the skull base with tortuosity but no plaque or stenosis. Proximal left subclavian artery and left vertebral artery origin are normal aside from tortuosity. The left vertebral appears mildly dominant, with V2 segment tortuosity but no plaque or stenosis to the skull base. CTA HEAD Posterior circulation: Tortuous distal vertebral arteries with no plaque or stenosis. Patent PICA origins and vertebrobasilar junction. Tortuous basilar artery is patent without stenosis. Basilar tip, SCA and PCA  origins appear stable and within normal limits. Posterior communicating arteries are diminutive or absent. Bilateral PCA branches are stable and within normal limits. Anterior circulation: Both ICA siphons are patent. There is tortuosity and mild irregularity of the left siphon with no discrete plaque or stenosis. This is stable. Right siphon is tortuous without plaque or stenosis. Patent carotid termini. MCA and ACA origins remain normal. Mildly dominant right A1 segment. Tortuous A1s. Anterior communicating artery and bilateral ACA branches are within normal limits. Left MCA M1 segment and bifurcation are patent without stenosis. Left MCA branches are stable. Right MCA M1 segment and bifurcation are patent without stenosis. Right MCA branches are stable. Venous sinuses: Stable patency since November. Dominant appearing right transverse and sigmoid sinuses, with some effacement of those bilateral sinuses. Anatomic variants: Mildly dominant left vertebral artery, right ACA A1 segment. Review of the MIP images confirms the above findings IMPRESSION: 1. Negative for emergent large vessel occlusion, negative CTP, and stable CTA appearance of the head and neck since November. 2. Unchanged small 3 mm focus of hypodensity in the right ICA bulb, now favored to be a small web or chronic soft plaque rather than adherent thrombus. 3. No other atherosclerosis identified. No significant arterial stenosis. These results were communicated to Dr. Cheral Marker at 10:20  pm on 04/09/2019 by text page via the [facility] messaging system. Electronically Signed   By: Genevie Ann M.D.   On: 04/09/2019 22:24   VAS [place] TRANSCRANIAL DOPPLER W BUBBLES  Result Date: 04/10/2019  Transcranial Doppler with Bubble Indications: Stroke. Comparison Study: No prior study Performing Technologist: Maudry Mayhew MHA, RDMS, RVT, RDCS  Examination Guidelines: A complete evaluation includes B-mode imaging, spectral Doppler, color Doppler, and power Doppler as  needed of all accessible portions of each vessel. Bilateral testing is considered an integral part of a complete examination. Limited examinations for reoccurring indications may be performed as noted.  Summary:  A vascular evaluation was performed. The right middle cerebral artery was studied. An IV was inserted into the patient's right forearm. Verbal informed consent was obtained.  No evidence of HITS (high intensity transient signals) at rest or with Valsalva maneuver. Therefore, there is no evidence of clinically significant PFO (patent foramen ovale). *See table(s) above for TCD measurements and observations.    Preliminary    CT HEAD CODE STROKE WO CONTRAST  Result Date: 04/09/2019 CLINICAL DATA:  Code stroke. 44 year old female with left side numbness. 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 EXAM: CT HEAD WITHOUT CONTRAST TECHNIQUE: Contiguous axial images were obtained from the base of the skull through the vertex without intravenous contrast. COMPARISON:  Head CT without contrast 12/28/2018. Brain MRI and intracranial MRA, CTA head and neck 12/27/2018. 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 No midline shift, ventriculomegaly, mass effect, evidence of mass lesion, intracranial hemorrhage or evidence of cortically based acute infarction. Gray-white matter differentiation in the left hemisphere and posterior fossa remains normal. Vascular: No suspicious intracranial vascular hyperdensity. Skull: No acute osseous abnormality identified. Sinuses/Orbits: Visualized paranasal sinuses and mastoids are stable and well pneumatized. Other: Visualized orbits and scalp soft tissues are within normal limits. ASPECTS [facility] Stroke Program Early CT Score) Total score (0-10 with 10 being normal): 10 IMPRESSION: 1. No acute cortically based infarct or acute  intracranial hemorrhage identified. 2. 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 ASPECTS 10. 3. These results were communicated to Dr. Cheral Marker at 8:45 pm on 04/09/2019 by text page via the [facility] messaging system. Electronically Signed   By: Genevie Ann M.D.   On: 04/09/2019 20:45   VAS [place] LOWER EXTREMITY VENOUS (DVT)  Result Date: 04/10/2019  Lower Venous DVTStudy Indications: Stroke.  Comparison Study: No prior study Performing Technologist: Maudry Mayhew MHA, RDMS, RVT, RDCS  Examination Guidelines: A complete evaluation includes B-mode imaging, spectral Doppler, color Doppler, and power Doppler as needed of all accessible portions of each vessel. Bilateral testing is considered an integral part of a complete examination. Limited examinations for reoccurring indications may be performed as noted. The reflux portion of the exam is performed with the patient in reverse Trendelenburg.  +---------+---------------+---------+-----------+----------+--------------+ RIGHT    CompressibilityPhasicitySpontaneityPropertiesThrombus Aging +---------+---------------+---------+-----------+----------+--------------+ CFV      Full           Yes      Yes                                 +---------+---------------+---------+-----------+----------+--------------+ SFJ      Full                                                        +---------+---------------+---------+-----------+----------+--------------+  FV Prox  Full                                                        +---------+---------------+---------+-----------+----------+--------------+ FV Mid   Full                                                        +---------+---------------+---------+-----------+----------+--------------+ FV DistalFull                                                        +---------+---------------+---------+-----------+----------+--------------+ PFV      Full                                                         +---------+---------------+---------+-----------+----------+--------------+ POP      Full           Yes      Yes                                 +---------+---------------+---------+-----------+----------+--------------+ PTV      Full                                                        +---------+---------------+---------+-----------+----------+--------------+ PERO     Full                                                        +---------+---------------+---------+-----------+----------+--------------+   +---------+---------------+---------+-----------+----------+--------------+ LEFT     CompressibilityPhasicitySpontaneityPropertiesThrombus Aging +---------+---------------+---------+-----------+----------+--------------+ CFV      Full           Yes      Yes                                 +---------+---------------+---------+-----------+----------+--------------+ SFJ      Full                                                        +---------+---------------+---------+-----------+----------+--------------+ FV Prox  Full                                                        +---------+---------------+---------+-----------+----------+--------------+  FV Mid   Full                                                        +---------+---------------+---------+-----------+----------+--------------+ FV DistalFull                                                        +---------+---------------+---------+-----------+----------+--------------+ PFV      Full                                                        +---------+---------------+---------+-----------+----------+--------------+ POP      Full           Yes      Yes                                 +---------+---------------+---------+-----------+----------+--------------+ PTV      Full                                                         +---------+---------------+---------+-----------+----------+--------------+ PERO     Full                                                        +---------+---------------+---------+-----------+----------+--------------+     Summary: RIGHT: - There is no evidence of deep vein thrombosis in the lower extremity.  - No cystic structure found in the popliteal fossa.  LEFT: - There is no evidence of deep vein thrombosis in the lower extremity.  - No cystic structure found in the popliteal fossa.  *See table(s) above for measurements and observations. Electronically signed by Deitra Mayo MD on 04/10/2019 at 1:57:33 PM.    Final     PHYSICAL EXAM Obese middle-aged African-American lady not in distress. . Afebrile. Head is nontraumatic. Neck is supple without bruit.    Cardiac exam no murmur or gallop. Lungs are clear to auscultation. Distal pulses are well felt. Neurological Exam : She is awake alert oriented to time place and person.  Speech and language appear normal.  Extraocular movements are full range without nystagmus.  Visual acuity and fields appear normal. Facial symmtrical.  Tongue midline.  Motor system exam shows symmetrical BUEs and BLEs.  Sensation is intact.  Coordination is accurate.  Gait not tested.   ASSESSMENT/PLAN [name] is a 44 y.o. female with history of depression, bipolar disorder, CVA, HTN and EtOH dependence presenting with left sided sensory numbness with worsened left sided weakness relative to her baseline impairment.   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 Scattered R MCA encephalomalacia stable.   CTA head & neck no ELVO. Unchanged R ICA bulb thrombus. No atherosclerosis.   CT perfusion negative  MRI  Multiple punctate R MCA territory infarcts. Old R parietal infarct.   LE Doppler  Neg   2D Echo - 12/2018 EF 60-65%. No source of  embolus   TCD bubble - negative   LDL 27  HgbA1c 5.5  Homocystine pending  Lovenox 40 mg sq daily for VTE prophylaxis  aspirin 81 mg daily prior to admission, now on aspirin 81 mg daily. 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  MRI showed right MCA, ACA, caudate head infarcts.  CT head and neck right distal CCA/proximal ICA thrombus.  EF 60 to 65%.  Negative for DVT.  LDL at 5 and A1c 5.6.  TEE unremarkable, negative for PFO.  Hypercoagulable work-up negative except homocystine level was 35.3.  Given carotid thrombus, started on Coumadin on discharge.  01/2018 follow-up in clinic repeat CT head and neck showed right ICA normal shape, no carotid lab, no plaque, no thrombus.  Coumadin discontinued change to antiplatelet.  12/2018 - admitted for left sided weakness.  Status post TPA.  MRI negative for acute infarct.  CTA head and neck again showed 2 mm new thrombus.  EF 60 to 60%.  LDL 32 and A1c 5.6.  EEG no seizure.  Discharged on aspirin 325.  Given headache and migraine, also put on Topamax 50 twice daily.   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   Discussed with Dr. Lorenso Courier oncology and agree with above plan.  Chronic iron deficiency anemia  Hb 6.7->PRBC->7.9->8.0  Microcytic anemia, low MCV, MCH and MCHC  Menorrhagia secondary to urinary fibroids with  heavy.   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  Current homocystine pending  Recent COVID-19 infection  01/24/2019 symptomatic   respiratory sxs and fevers - 100.2->99.2->98.5->98.6  Currently asymptomatic  Hypertension  Stable . Permissive hypertension (OK if < 220/120) but gradually normalize in 5-7 days . Long-term BP goal normotensive  Other Stroke Risk Factors  ETOH use, alcohol level <10, advised to drink no more than 1 drink(s) a day  Obesity, Body mass index is 30.23 kg/m., recommend weight loss, diet and exercise as appropriate   Family hx stroke (father, maternal aunt)  Chronic Migraines  Likely Obstructive sleep apnea -patient interested in participating in Sleep Smart study  Other Active Problems   Bipolar I d/o  Leukocytosis 13.1-13.5->10.1  Hypokalemia - 3.3 -> supplemented   Hospital day # 1  I spent  35 minutes in total face-to-face time with the patient, more than 50% of which was spent in counseling and coordination of care, reviewing test results, images and medication, and  discussing the diagnosis of recurrent stroke, recurrent right carotid thrombus, chronic iron deficiency anemia, longstanding thrombocythemia, treatment plan and potential prognosis. This patient's care requiresreview of multiple databases, neurological assessment, discussion with family, other specialists and medical decision making of high complexity.  I also discussed with oncologist Dr. Lorenso Courier.  Rosalin Hawking, MD PhD Stroke Neurology 04/11/2019 6:43 PM  To contact Stroke Continuity provider, please refer to [email or web address] After hours, contact General Neurology

## 2019-04-12 DIAGNOSIS — F319 Bipolar disorder, unspecified: Secondary | ICD-10-CM

## 2019-04-12 DIAGNOSIS — G4733 Obstructive sleep apnea (adult) (pediatric): Secondary | ICD-10-CM

## 2019-04-12 DIAGNOSIS — E538 Deficiency of other specified B group vitamins: Secondary | ICD-10-CM

## 2019-04-12 LAB — CBC
HCT: 27.6 % — ABNORMAL LOW (ref 36.0–46.0)
Hemoglobin: 7.8 g/dL — ABNORMAL LOW (ref 12.0–15.0)
MCH: 18.2 pg — ABNORMAL LOW (ref 26.0–34.0)
MCHC: 28.3 g/dL — ABNORMAL LOW (ref 30.0–36.0)
MCV: 64.3 fL — ABNORMAL LOW (ref 80.0–100.0)
Platelets: 805 10*3/uL — ABNORMAL HIGH (ref 150–400)
RBC: 4.29 MIL/uL (ref 3.87–5.11)
RDW: 27.8 % — ABNORMAL HIGH (ref 11.5–15.5)
WBC: 9.9 10*3/uL (ref 4.0–10.5)
nRBC: 0.3 % — ABNORMAL HIGH (ref 0.0–0.2)

## 2019-04-12 LAB — BASIC METABOLIC PANEL
Anion gap: 10 (ref 5–15)
BUN: 8 mg/dL (ref 6–20)
CO2: 22 mmol/L (ref 22–32)
Calcium: 9 mg/dL (ref 8.9–10.3)
Chloride: 105 mmol/L (ref 98–111)
Creatinine, Ser: 0.87 mg/dL (ref 0.44–1.00)
GFR calc Af Amer: >60 mL/min (ref >60)
GFR calc non Af Amer: >60 mL/min (ref >60)
Glucose, Bld: 94 mg/dL (ref 70–99)
Potassium: 3.6 mmol/L (ref 3.5–5.1)
Sodium: 137 mmol/L (ref 135–145)

## 2019-04-12 MED ORDER — SENNOSIDES-DOCUSATE SODIUM 8.6-50 MG PO TABS: 1.0000 | ORAL_TABLET | Freq: Two times a day (BID) | ORAL | Status: DC

## 2019-04-12 MED ORDER — POLYSACCHARIDE IRON COMPLEX 150 MG PO CAPS
150.0000 mg | ORAL_CAPSULE | Freq: Every day | ORAL | Status: DC
  Administered 2019-04-12: 150 mg via ORAL
  Filled 2019-04-12: qty 1

## 2019-04-12 MED ORDER — POTASSIUM CHLORIDE CRYS ER 20 MEQ PO TBCR
40.0000 meq | EXTENDED_RELEASE_TABLET | Freq: Once | ORAL | Status: AC
  Administered 2019-04-12: 40 meq via ORAL
  Filled 2019-04-12: qty 2

## 2019-04-12 MED ORDER — PYRIDOXINE HCL 50 MG PO TABS: 50.0000 mg | ORAL_TABLET | Freq: Every day | ORAL | Status: DC

## 2019-04-12 MED ORDER — VITAMIN B-6 50 MG PO TABS
50.0000 mg | ORAL_TABLET | Freq: Every day | ORAL | Status: DC
  Filled 2019-04-12: qty 1

## 2019-04-12 MED ORDER — VITAMIN B-12 1000 MCG PO TABS: 1000.0000 ug | ORAL_TABLET | Freq: Every day | ORAL | Status: DC

## 2019-04-12 NOTE — Progress Notes (Signed)
STROKE TEAM PROGRESS NOTE   INTERVAL HISTORY Pt lying in bed, husband at bedside. Patient neuro stable, no acute event overnight. Had IV iron infusion yesterday, today hemoglobin 7.8. Platelet down to 805. I discussed with Dr. Lorenso Courier yesterday, will continue aspirin at this time given severe anemia. Will treat iron deficiency anemia and monitor platelet. If platelet still high after anemia improved, will do further testing for essential thrombocythemia.  Vitals:   04/11/19 2351 04/12/19 0355 04/12/19 0817 04/12/19 1223  BP: (!) 173/107 (!) 167/94 (!) 153/97 (!) 144/93  Pulse: 71 65 (!) 58 (!) 59  Resp: 18 19 18 18   Temp: 99.4 F (37.4 C) 98.8 F (37.1 C) 98.6 F (37 C) 98.8 F (37.1 C)  TempSrc: Oral Oral Oral Oral  SpO2: 100% 100% 100% 98%  Weight:      Height:        CBC:  Recent Labs  Lab 04/10/19 0400 04/10/19 0400 04/11/19 0351 04/12/19 0345  WBC 13.5*   < > 10.1 9.9  NEUTROABS 10.9*  --  7.3  --   HGB 7.9*   < > 8.0* 7.8*  HCT 28.4*   < > 27.7* 27.6*  MCV 65.3*   < > 63.7* 64.3*  PLT 956*   < > 845* 805*   < > = values in this interval not displayed.    Basic Metabolic Panel:  Recent Labs  Lab 04/11/19 0351 04/11/19 0931 04/12/19 0345  NA 137  --  137  K 3.3*  --  3.6  CL 103  --  105  CO2 23  --  22  GLUCOSE 101*  --  94  BUN 6  --  8  CREATININE 0.90  --  0.87  CALCIUM 9.0  --  9.0  MG  --  2.0  --    Lipid Panel:     Component Value Date/Time   CHOL 76 04/10/2019 0400   TRIG 78 04/10/2019 0400   HDL 33 (L) 04/10/2019 0400   CHOLHDL 2.3 04/10/2019 0400   VLDL 16 04/10/2019 0400   LDLCALC 27 04/10/2019 0400   HgbA1c:  Lab Results  Component Value Date   HGBA1C 5.5 04/10/2019   Urine Drug Screen:     Component Value Date/Time   LABOPIA NONE DETECTED 04/09/2019 2005   COCAINSCRNUR NONE DETECTED 04/09/2019 2005   LABBENZ NONE DETECTED 04/09/2019 2005   AMPHETMU NONE DETECTED 04/09/2019 2005   THCU NONE DETECTED 04/09/2019 2005   LABBARB  NONE DETECTED 04/09/2019 2005    Alcohol Level     Component Value Date/Time   ETH <10 04/09/2019 1954    IMAGING past 48 hours No results found.  PHYSICAL EXAM Obese middle-aged African-American lady not in distress. . Afebrile. Head is nontraumatic. Neck is supple without bruit.    Cardiac exam no murmur or gallop. Lungs are clear to auscultation. Distal pulses are well felt.  Neurological Exam : Mallory Robinson is awake alert oriented to time place and person.  Speech and language appear normal.  Extraocular movements are full range without nystagmus.  Visual acuity and fields appear normal. Facial symmtrical.  Tongue midline.  Motor system exam shows symmetrical BUEs and BLEs.  Sensation is intact.  Coordination is accurate.  Gait not tested.   ASSESSMENT/PLAN Mallory Robinson is a 44 y.o. female with history of depression, bipolar disorder, CVA, HTN and EtOH dependence presenting with left sided sensory numbness with worsened left sided weakness relative to her baseline impairment.  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 Scattered R MCA encephalomalacia stable.   CTA head & neck no ELVO. Unchanged R ICA bulb thrombus. No atherosclerosis.   CT perfusion negative  MRI  Multiple punctate R MCA territory infarcts. Old R parietal infarct.   LE Doppler  Neg   2D Echo - 12/2018 EF 60-65%. No source of embolus   TCD bubble - negative   LDL 27  HgbA1c 5.5  Homocystine pending  Repeat hypercoag labs pending  Lovenox 40 mg sq daily for VTE prophylaxis  aspirin 81 mg daily prior to admission, now on aspirin 81 mg daily. No additional antiplatelet or AC given anemia requiring transfusion  Therapy recommendations:  Outpatient PT   Disposition:  pending   Hx stroke/TIA and right carotid thrombus - concerning related to thrombocythermia  10/2017 admitted for left-sided weakness,  numbness and right vision loss.  MRI showed right MCA, ACA, caudate head infarcts.  CT head and neck right distal CCA/proximal ICA thrombus.  EF 60 to 65%.  Negative for DVT.  LDL at 5 and A1c 5.6.  TEE unremarkable, negative for PFO.  Hypercoagulable work-up negative except homocystine level was 35.3.  Given carotid thrombus, started on Coumadin on discharge.  01/2018 follow-up in clinic repeat CT head and neck showed right ICA normal shape, no carotid web, no plaque, no thrombus.  Coumadin discontinued change to antiplatelet.  12/2018 - admitted for left sided weakness.  Status post TPA.  MRI negative for acute infarct.  CTA head and neck again showed 2 mm new thrombus.  EF 60 to 60%.  LDL 32 and A1c 5.6.  EEG no seizure.  Discharged on aspirin 325.  Given headache and migraine, also put on Topamax 50 twice daily.   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   Discussed with Dr. Lorenso Courier oncology and agree with above plan.  Chronic iron deficiency anemia  Hb 6.7->PRBC->7.9->8.0->7.8  Microcytic anemia, low MCV, MCH and MCHC  Menorrhagia secondary to urinary fibroids with heavy.   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  Current homocystine pending  B12 = 181  Continue home Folic acid  Received [id number] 1000ug im once, recommend to continue po on discharge  Add VitB6 50mg  daily  Recent COVID-19  infection  01/24/2019 symptomatic   respiratory sxs and fevers  Currently asymptomatic  Hypertension  Stable . Gradually normalize in 2-3 days . Long-term BP goal normotensive  Other Stroke Risk Factors  ETOH use, alcohol level <10, advised to drink no more than 1 drink(s) a day  Obesity, Body mass index is 30.23 kg/m., recommend weight loss, diet and exercise as appropriate   Family hx stroke (father, maternal aunt)  Chronic Migraines  Likely Obstructive sleep apnea -patient interested in participating in Sleep Smart study  Other Active Problems   Bipolar I d/o  Leukocytosis 13.1-13.5->10.1->9.9  Hypokalemia - 3.3 -> supplemented->3.6  Hospital day # 2  Neurology will sign off. Please call with questions. Pt will follow up with stroke clinic NP Jessica at [facility] on 05/20/19. Thanks for the consult. I discussed with Dr. Dub Amis, MD PhD Stroke Neurology 04/12/2019 3:45 PM  To contact Stroke Continuity provider, please refer to [email or web address] After hours, contact General Neurology

## 2019-04-12 NOTE — Discharge Summary (Addendum)
Physician Discharge Summary  Mallory Robinson [id number] DOB: [birth year] DOA: 04/09/2019  PCP: Nolene Ebbs, MD  Admit date: 04/09/2019 Discharge date: 04/12/2019  Time spent: 55 minutes  Recommendations for Outpatient Follow-up:  1. Follow-up with Nolene Ebbs, MD in 2 weeks.  On follow-up patient need a CBC done to follow-up on H&H. 2. Follow-up with Frann Rider, NP neurology on 05/20/2019. 3. Follow-up with Dr. Narda Rutherford, hematology in 1 week.  On follow-up patient will need a CBC done to follow-up on H&H.  Patient will need second dose of IV Feraheme done at that time and further evaluation of elevated platelet count. 4. Follow-up with OB/GYN in 1 to 2 weeks for further management of uterine fibroids/menorrhagia.   Discharge Diagnoses:  Principal Problem:   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HCC) Active Problems:   Bipolar I disorder (HCC)   Microcytic anemia   Thrombocytosis (HCC)   Chronic migraine   Hypertension   Stroke-like episode   Folate deficiency   Low vitamin B12 level   OSA (obstructive sleep apnea): Probable   Discharge Condition: Stable and improved  Diet recommendation: Heart healthy  Filed Weights   04/10/19 1617  Weight: 77.4 kg    History of present illness:  HPI per Dr. Leron Croak is a 44 y.o. female with medical history significant for bipolar disorder, migraines, menorrhagia, hypertension, and stroke with left-sided weakness, now presenting to the ED for evaluation of left-sided numbness and weakness as well as visual disturbance involving the right eye.  She had been in her usual state of health until approximately 3 PM when she experienced worsening in her chronic left-sided weakness, left-sided numbness, and slurred speech.  This was associated with dull ache behind the right eye and zigzag lines that resolved when she closed her right eye.  She notes that the symptoms are very similar to what she has experienced previously.  There was  no preceding trauma.  She developed fevers, chills, and respiratory symptoms in early December, was diagnosed with COVID-19, and reports making a full recovery from that.  She continues to experience menorrhagia, saw OB/GYN for this, was diagnosed with uterine fibroids, and is considering surgery for this.  She had been on iron supplementation but stopped taking her iron pills regularly due to constipation.  ED Course: Upon arrival to the ED, patient is found to be afebrile, saturating well on room air, and with blood pressure 163/96.  EKG features sinus rhythm and noncontrast head CT is negative for acute intracranial abnormality but notable for scattered right MCA territory encephalomalacia that appears stable.  Chemistry panel was unremarkable and CBC notable for leukocytosis to 13,100, microcytic anemia with hemoglobin 6.7, and thrombocytosis with platelets 1,072,000.  UDS was negative, urinalysis unremarkable, and ethanol undetectable.  CTA head and neck and CT perfusion study is negative for emergent large vessel occlusion and stable subtle round hypodense area at the right carotid bifurcation, stable carotid tortuosity, and negative perfusion study.  Patient was given a liter of lactated Ringer's, Benadryl, Compazine, and 1 unit of packed red blood cells in the ED.  Neurology was consulted by the ED physician and recommended MRI brain and medical admission.   Hospital Course:  1 acute ischemic right MCA stroke, probably embolic related to recurrent right carotid thrombus Questionable etiology.  Patient had presented with acute worsening of her left-sided weakness and right eye visual disturbance.  Head CT done on admission with no acute abnormalities.  Stable chronic findings noted on  CT angiogram head and neck.  Unchanged small 3 mm focus of hypodensity in the right ICA bulb, now favored to be a small web or chronic soft plaque rather than adherent thrombus noted on CT angiogram head and neck.   Patient improved clinically.  Patient noted to be taking the aspirin chronically at home and not on a statin as recent LDL of 32.  Patient seen in consultation by neurology and stroke work-up done.  2D echo with normal EF with no source of emboli.  Transcranial Doppler bubble study negative.  Lower extremity Dopplers negative for DVT.  Patient per neurology noted in September 2019 when she was admitted for left-sided weakness and numbness and right vision loss noted to have CT head and neck right distal CCA/proximal ICA thrombus.  Homocystine level noted to be at 35.3 at that time.  It was noted that time patient was started on Coumadin on discharge.  On 01/2018 follow-up CT of head and neck done in clinic was negative for thrombus at that time and Coumadin discontinued.  On 12/2018 patient had presented with left-sided weakness status post TPA CT head and neck showed a 2 mm new thrombus at that time.  Patient was discharged on aspirin.  Patient improved clinically during the hospitalization.  Was seen by PT/OT.  Due to patient's anemia requiring transfusion no additional platelet or anticoagulation recommended at this time per neurology until patient's anemia has improved/treated and recommending patient be discharged on aspirin 81 mg daily with close outpatient follow-up with neurology.   2.  Microcytic anemia, thrombocytosis/folate deficiency Patient noted to have a hemoglobin of 6.7 on presentation to the ED with MCV of 62.1 and a platelet count of 1072.  Patient noted to have iron deficiency anemia that she attributed to menorrhagia and is being followed by OB/GYN and diagnosed as uterine fibroids and considering surgery for this.  Patient noted to have evidence of iron deficiency anemia in 2019, iron studies normalized after taking oral iron supplementations but patient has been off iron supplementations due to constipation.  Patient status post 1 unit packed red blood cells in the ED hemoglobin currently  at 7.9.  Thrombocytosis could be secondary to iron deficiency anemia however patient now with acute right MCA CVA likely embolic in nature.  Anemia panel consistent with severe iron deficiency anemia with iron level of 16, ferritin of 2, TIBC 550, folate of 4.3.  Sed rate of 3.  CRP of 0.5.    Patient status post IV Feraheme x1 during the hospitalization.  Hemoglobin remained stable at 7.8 by day of discharge. Hematology assessed and followed the patient during the hospitalization and recommended IV Feraheme be administered during the hospitalization and patient follow-up in the outpatient setting for a second dose in the week.  Hematology recommended outpatient follow-up with OB/GYN for evaluation of heavy menstrual cycles/fibroids and treatment of patient's anemia.  It was recommended that if patient's anemia has improved and patient still with a high platelet count will likely need further outpatient work-up to rule out essential thrombocythemia.  Patient will follow up with hematology/oncology in the outpatient setting.   3.  Chronic migraines Frequency and severity improved on daily propranolol which was resumed on 04/10/2019 during the hospitalization.   4.  Hypertension Permissive hypertension secondary to problem #1.   Patient maintained on home regimen of propranolol.   5.  Recent COVID-19 Patient had a positive COVID-19 test on 01/24/2019 and experienced fever and respiratory symptoms that have completely resolved.  Per infection prevention department recommends not retesting or instituting special precautions if positive test was > 21 days but < 90 days.  Patient remained asymptomatic. Supportive care.  6.  Probable OSA Patient was seen by neurology and enrolled in the sleep smart study.  Outpatient follow-up with neurology.   Procedures:  CT angiogram head and neck 04/09/2019  CT head 04/09/2019  Transcranial Doppler 04/10/2019  Lower extremity Doppler 04/10/2019  2D echo  04/10/2019  MRI head 04/10/2019  Transfuse 1 unit packed red blood cells in the ED 04/09/2019  Consultations:  Neurology: Dr. Cheral Marker 04/09/2019  Discharge Exam: Vitals:   04/12/19 1223 04/12/19 1610  BP: (!) 144/93 (!) 145/105  Pulse: (!) 59 66  Resp: 18 18  Temp: 98.8 F (37.1 C) 98.6 F (37 C)  SpO2: 98% 100%    General: NAD Cardiovascular: RRR Respiratory: CTAB  Discharge Instructions   Discharge Instructions    Diet - low sodium heart healthy   Complete by: As directed    Increase activity slowly   Complete by: As directed      Allergies as of 04/12/2019      Reactions   Food Itching, Swelling   Reaction to Melons (lips and eyes swelled)   Penicillins Hives, Itching, Other (See Comments)   Hair fell out Did it involve swelling of the face/tongue/throat, SOB, or low BP? No Did it involve sudden or severe rash/hives, skin peeling, or any reaction on the inside of your mouth or nose? Yes Did you need to seek medical attention at a hospital or doctor's office? In hospital reaction When did it last happen?44 yrs old If all above answers are "NO", may proceed with cephalosporin use.      Medication List    STOP taking these medications   benzonatate 100 MG capsule Commonly known as: TESSALON   topiramate 50 MG tablet Commonly known as: TOPAMAX     TAKE these medications   aspirin EC 81 MG tablet Take 81 mg by mouth daily. What changed: Another medication with the same name was removed. Continue taking this medication, and follow the directions you see here.   ferrous sulfate 325 (65 FE) MG tablet Take 1 tablet (325 mg total) by mouth 2 (two) times daily with a meal.   folic acid 1 MG tablet Commonly known as: FOLVITE Take 2 tablets (2 mg total) by mouth daily. What changed: how much to take   propranolol 20 MG tablet Commonly known as: INDERAL Take 1 tablet (20 mg total) by mouth 2 (two) times daily.   pyridOXINE 50 MG tablet Commonly known  as: B-6 Take 1 tablet (50 mg total) by mouth daily.   senna-docusate 8.6-50 MG tablet Commonly known as: Senokot-S Take 1 tablet by mouth 2 (two) times daily.   vitamin B-12 1000 MCG tablet Commonly known as: CYANOCOBALAMIN Take 1 tablet (1,000 mcg total) by mouth daily.      Allergies  Allergen Reactions  . Food Itching and Swelling    Reaction to Melons (lips and eyes swelled)  . Penicillins Hives, Itching and Other (See Comments)    Hair fell out Did it involve swelling of the face/tongue/throat, SOB, or low BP? No Did it involve sudden or severe rash/hives, skin peeling, or any reaction on the inside of your mouth or nose? Yes Did you need to seek medical attention at a hospital or doctor's office? In hospital reaction When did it last happen?44 yrs old If all above answers are "NO",  may proceed with cephalosporin use.   Follow-up Information    Frann Rider, NP. Go on 05/20/2019.   Specialty: Neurology Contact information: [place] [address] [phone]        Nolene Ebbs, MD. Schedule an appointment as soon as possible for a visit in 2 week(s).   Specialty: Internal Medicine Contact information: [place] [address] [phone]        Orson Slick, MD. Schedule an appointment as soon as possible for a visit in 1 week(s).   Specialty: Hematology and Oncology Contact information: [place] [address] [phone]        obgyn. Schedule an appointment as soon as possible for a visit in 1 week(s).   Why: f/u in 1-2 weeks           The results of significant diagnostics from this hospitalization (including imaging, microbiology, ancillary and laboratory) are listed below for reference.    Significant Diagnostic Studies: CT Code Stroke CTA Head W/WO contrast  Result Date: 04/09/2019 CLINICAL DATA:  44 year old female with left side numbness. History of adherent thrombus in the  right carotid bulb in November, small chronic infarcts in the right MCA territory. EXAM: CT ANGIOGRAPHY HEAD AND NECK CT PERFUSION BRAIN TECHNIQUE: Multidetector CT imaging of the head and neck was performed using the standard protocol during bolus administration of intravenous contrast. Multiplanar CT image reconstructions and MIPs were obtained to evaluate the vascular anatomy. Carotid stenosis measurements (when applicable) are obtained utilizing NASCET criteria, using the distal internal carotid diameter as the denominator. Multiphase CT imaging of the brain was performed following IV bolus contrast injection. Subsequent parametric perfusion maps were calculated using RAPID software. CONTRAST:  125mL OMNIPAQUE IOHEXOL 350 MG/ML SOLN COMPARISON:  Plain head CT 2033 hours today. FINDINGS: CT Brain Perfusion Findings: ASPECTS: 10 CBF (<30%) Volume: None Perfusion (Tmax>6.0s) volume: None Mismatch Volume: Not applicable Infarction Location:Not applicable CTA NECK Skeleton: No acute osseous abnormality identified. Upper chest: Negative. Other neck: Negative. Aortic arch: 3 vessel arch configuration with no arch or proximal great vessel atherosclerosis. Right carotid system: Negative right CCA. At the right carotid bifurcation there is a subtle persistent small round hypodense area at the posterior bulb measuring about 3 mm (series 7, image 86). This appears unchanged since November. Otherwise the right bifurcation is widely patent. Negative cervical right ICA otherwise, mild tortuosity distal to the bulb. Left carotid system: Stable and negative aside from tortuosity. Vertebral arteries: Proximal right subclavian artery and right vertebral artery origin are normal. The right vertebral artery is patent to the skull base with tortuosity but no plaque or stenosis. Proximal left subclavian artery and left vertebral artery origin are normal aside from tortuosity. The left vertebral appears mildly dominant, with V2 segment  tortuosity but no plaque or stenosis to the skull base. CTA HEAD Posterior circulation: Tortuous distal vertebral arteries with no plaque or stenosis. Patent PICA origins and vertebrobasilar junction. Tortuous basilar artery is patent without stenosis. Basilar tip, SCA and PCA origins appear stable and within normal limits. Posterior communicating arteries are diminutive or absent. Bilateral PCA branches are stable and within normal limits. Anterior circulation: Both ICA siphons are patent. There is tortuosity and mild irregularity of the left siphon with no discrete plaque or stenosis. This is stable. Right siphon is tortuous without plaque or stenosis. Patent carotid termini. MCA and ACA origins remain normal. Mildly dominant right A1 segment. Tortuous A1s. Anterior communicating artery and bilateral  ACA branches are within normal limits. Left MCA M1 segment and bifurcation are patent without stenosis. Left MCA branches are stable. Right MCA M1 segment and bifurcation are patent without stenosis. Right MCA branches are stable. Venous sinuses: Stable patency since November. Dominant appearing right transverse and sigmoid sinuses, with some effacement of those bilateral sinuses. Anatomic variants: Mildly dominant left vertebral artery, right ACA A1 segment. Review of the MIP images confirms the above findings IMPRESSION: 1. Negative for emergent large vessel occlusion, negative CTP, and stable CTA appearance of the head and neck since November. 2. Unchanged small 3 mm focus of hypodensity in the right ICA bulb, now favored to be a small web or chronic soft plaque rather than adherent thrombus. 3. No other atherosclerosis identified. No significant arterial stenosis. These results were communicated to Dr. Cheral Marker at 10:20 pm on 04/09/2019 by text page via the [facility] messaging system. Electronically Signed   By: Genevie Ann M.D.   On: 04/09/2019 22:24   CT Code Stroke CTA Neck W/WO contrast  Result Date:  04/09/2019 CLINICAL DATA:  44 year old female with left side numbness. 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 EXAM: CT ANGIOGRAPHY HEAD AND NECK CT PERFUSION BRAIN TECHNIQUE: Multidetector CT imaging of the head and neck was performed using the standard protocol during bolus administration of intravenous contrast. Multiplanar CT image reconstructions and MIPs were obtained to evaluate the vascular anatomy. Carotid stenosis measurements (when applicable) are obtained utilizing NASCET criteria, using the distal internal carotid diameter as the denominator. Multiphase CT imaging of the brain was performed following IV bolus contrast injection. Subsequent parametric perfusion maps were calculated using RAPID software. CONTRAST:  128mL OMNIPAQUE IOHEXOL 350 MG/ML SOLN COMPARISON:  Plain head CT 2033 hours today. FINDINGS: CT Brain Perfusion Findings: ASPECTS: 10 CBF (<30%) Volume: None Perfusion (Tmax>6.0s) volume: None Mismatch Volume: Not applicable Infarction Location:Not applicable CTA NECK Skeleton: No acute osseous abnormality identified. Upper chest: Negative. Other neck: Negative. Aortic arch: 3 vessel arch configuration with no arch or proximal great vessel atherosclerosis. Right carotid system: Negative right CCA. At the right carotid bifurcation there is a subtle persistent small round hypodense area at the posterior bulb measuring about 3 mm (series 7, image 86). This appears unchanged since November. Otherwise the right bifurcation is widely patent. Negative cervical right ICA otherwise, mild tortuosity distal to the bulb. Left carotid system: Stable and negative aside from tortuosity. Vertebral arteries: Proximal right subclavian artery and right vertebral artery origin are normal. The right vertebral artery is patent to the skull base with tortuosity but no plaque or stenosis. Proximal left subclavian artery and left vertebral  artery origin are normal aside from tortuosity. The left vertebral appears mildly dominant, with V2 segment tortuosity but no plaque or stenosis to the skull base. CTA HEAD Posterior circulation: Tortuous distal vertebral arteries with no plaque or stenosis. Patent PICA origins and vertebrobasilar junction. Tortuous basilar artery is patent without stenosis. Basilar tip, SCA and PCA origins appear stable and within normal limits. Posterior communicating arteries are diminutive or absent. Bilateral PCA branches are stable and within normal limits. Anterior circulation: Both ICA siphons are patent. There is tortuosity and mild irregularity of the left siphon with no discrete plaque or stenosis. This is stable. Right siphon is tortuous without plaque or stenosis. Patent carotid termini. MCA and ACA origins remain normal. Mildly dominant right A1 segment. Tortuous A1s. Anterior communicating artery and bilateral ACA branches are  within normal limits. Left MCA M1 segment and bifurcation are patent without stenosis. Left MCA branches are stable. Right MCA M1 segment and bifurcation are patent without stenosis. Right MCA branches are stable. Venous sinuses: Stable patency since November. Dominant appearing right transverse and sigmoid sinuses, with some effacement of those bilateral sinuses. Anatomic variants: Mildly dominant left vertebral artery, right ACA A1 segment. Review of the MIP images confirms the above findings IMPRESSION: 1. Negative for emergent large vessel occlusion, negative CTP, and stable CTA appearance of the head and neck since November. 2. Unchanged small 3 mm focus of hypodensity in the right ICA bulb, now favored to be a small web or chronic soft plaque rather than adherent thrombus. 3. No other atherosclerosis identified. No significant arterial stenosis. These results were communicated to Dr. Cheral Marker at 10:20 pm on 04/09/2019 by text page via the [facility] messaging system. Electronically Signed   By: Genevie Ann M.D.   On: 04/09/2019 22:24   MR BRAIN WO CONTRAST  Result Date: 04/10/2019 CLINICAL DATA:  Left-sided numbness and weakness EXAM: MRI HEAD WITHOUT CONTRAST TECHNIQUE: Multiplanar, multiecho pulse sequences of the brain and surrounding structures were obtained without intravenous contrast. COMPARISON:  Brain MRI 12/27/2018 FINDINGS: BRAIN: There are scattered punctate foci of abnormal diffusion restriction within the right MCA territory. No contralateral diffusion abnormality. There is an old posterior right parietal infarct. Multifocal white matter hyperintensity, most commonly due to chronic ischemic microangiopathy. Normal volume of brain parenchyma and CSF spaces. Midline structures are normal. VASCULAR: Major flow voids are preserved. Unchanged hemosiderin deposition within the posterior right hemisphere. SKULL AND UPPER CERVICAL SPINE: Normal calvarium and skull base. Visualized upper cervical spine and soft tissues are normal. SINUSES/ORBITS: No fluid levels or advanced mucosal thickening. No mastoid or middle ear effusion. Normal orbits. IMPRESSION: 1. Multiple punctate foci of acute ischemia within the right MCA territory. No hemorrhage or mass effect. 2. Old posterior right parietal infarct and findings of mild chronic ischemic microangiopathy. Electronically Signed   By: Ulyses Jarred M.D.   On: 04/10/2019 02:43   CT Code Stroke Cerebral Perfusion with contrast  Result Date: 04/09/2019 CLINICAL DATA:  44 year old female with left side numbness. 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 EXAM: CT ANGIOGRAPHY HEAD AND NECK CT PERFUSION BRAIN TECHNIQUE: Multidetector CT imaging of the head and neck was performed using the standard protocol during bolus administration of intravenous contrast. Multiplanar CT image reconstructions and MIPs were obtained to evaluate the vascular anatomy. Carotid stenosis measurements (when  applicable) are obtained utilizing NASCET criteria, using the distal internal carotid diameter as the denominator. Multiphase CT imaging of the brain was performed following IV bolus contrast injection. Subsequent parametric perfusion maps were calculated using RAPID software. CONTRAST:  166mL OMNIPAQUE IOHEXOL 350 MG/ML SOLN COMPARISON:  Plain head CT 2033 hours today. FINDINGS: CT Brain Perfusion Findings: ASPECTS: 10 CBF (<30%) Volume: None Perfusion (Tmax>6.0s) volume: None Mismatch Volume: Not applicable Infarction Location:Not applicable CTA NECK Skeleton: No acute osseous abnormality identified. Upper chest: Negative. Other neck: Negative. Aortic arch: 3 vessel arch configuration with no arch or proximal great vessel atherosclerosis. Right carotid system: Negative right CCA. At the right carotid bifurcation there is a subtle persistent small round hypodense area at the posterior bulb measuring about 3 mm (series 7, image 86). This appears unchanged since November. Otherwise the right bifurcation is widely patent. Negative cervical right ICA otherwise, mild tortuosity distal to the  bulb. Left carotid system: Stable and negative aside from tortuosity. Vertebral arteries: Proximal right subclavian artery and right vertebral artery origin are normal. The right vertebral artery is patent to the skull base with tortuosity but no plaque or stenosis. Proximal left subclavian artery and left vertebral artery origin are normal aside from tortuosity. The left vertebral appears mildly dominant, with V2 segment tortuosity but no plaque or stenosis to the skull base. CTA HEAD Posterior circulation: Tortuous distal vertebral arteries with no plaque or stenosis. Patent PICA origins and vertebrobasilar junction. Tortuous basilar artery is patent without stenosis. Basilar tip, SCA and PCA origins appear stable and within normal limits. Posterior communicating arteries are diminutive or absent. Bilateral PCA branches are stable  and within normal limits. Anterior circulation: Both ICA siphons are patent. There is tortuosity and mild irregularity of the left siphon with no discrete plaque or stenosis. This is stable. Right siphon is tortuous without plaque or stenosis. Patent carotid termini. MCA and ACA origins remain normal. Mildly dominant right A1 segment. Tortuous A1s. Anterior communicating artery and bilateral ACA branches are within normal limits. Left MCA M1 segment and bifurcation are patent without stenosis. Left MCA branches are stable. Right MCA M1 segment and bifurcation are patent without stenosis. Right MCA branches are stable. Venous sinuses: Stable patency since November. Dominant appearing right transverse and sigmoid sinuses, with some effacement of those bilateral sinuses. Anatomic variants: Mildly dominant left vertebral artery, right ACA A1 segment. Review of the MIP images confirms the above findings IMPRESSION: 1. Negative for emergent large vessel occlusion, negative CTP, and stable CTA appearance of the head and neck since November. 2. Unchanged small 3 mm focus of hypodensity in the right ICA bulb, now favored to be a small web or chronic soft plaque rather than adherent thrombus. 3. No other atherosclerosis identified. No significant arterial stenosis. These results were communicated to Dr. Cheral Marker at 10:20 pm on 04/09/2019 by text page via the [facility] messaging system. Electronically Signed   By: Genevie Ann M.D.   On: 04/09/2019 22:24   VAS [place] TRANSCRANIAL DOPPLER W BUBBLES  Result Date: 04/10/2019  Transcranial Doppler with Bubble Indications: Stroke. Comparison Study: No prior study Performing Technologist: Maudry Mayhew MHA, RDMS, RVT, RDCS  Examination Guidelines: A complete evaluation includes B-mode imaging, spectral Doppler, color Doppler, and power Doppler as needed of all accessible portions of each vessel. Bilateral testing is considered an integral part of a complete examination. Limited  examinations for reoccurring indications may be performed as noted.  Summary:  A vascular evaluation was performed. The right middle cerebral artery was studied. An IV was inserted into the patient's right forearm. Verbal informed consent was obtained.  No evidence of HITS (high intensity transient signals) at rest or with Valsalva maneuver. Therefore, there is no evidence of clinically significant PFO (patent foramen ovale). *See table(s) above for TCD measurements and observations.    Preliminary    CT HEAD CODE STROKE WO CONTRAST  Result Date: 04/09/2019 CLINICAL DATA:  Code stroke. 44 year old female with left side numbness. 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 EXAM: CT HEAD WITHOUT CONTRAST TECHNIQUE: Contiguous axial images were obtained from the base of the skull through the vertex without intravenous contrast. COMPARISON:  Head CT without contrast 12/28/2018. Brain MRI and intracranial MRA, CTA head and neck 12/27/2018. 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 No  midline shift, ventriculomegaly, mass effect, evidence of mass lesion, intracranial hemorrhage or evidence of cortically based acute infarction. Gray-white matter differentiation in the left hemisphere and posterior fossa remains normal. Vascular: No suspicious intracranial vascular hyperdensity. Skull: No acute osseous abnormality identified. Sinuses/Orbits: Visualized paranasal sinuses and mastoids are stable and well pneumatized. Other: Visualized orbits and scalp soft tissues are within normal limits. ASPECTS [facility] Stroke Program Early CT Score) Total score (0-10 with 10 being normal): 10 IMPRESSION: 1. No acute cortically based infarct or acute intracranial hemorrhage identified. 2. 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 ASPECTS 10. 3.  These results were communicated to Dr. Cheral Marker at 8:45 pm on 04/09/2019 by text page via the [facility] messaging system. Electronically Signed   By: Genevie Ann M.D.   On: 04/09/2019 20:45   VAS [place] LOWER EXTREMITY VENOUS (DVT)  Result Date: 04/10/2019  Lower Venous DVTStudy Indications: Stroke.  Comparison Study: No prior study Performing Technologist: Maudry Mayhew MHA, RDMS, RVT, RDCS  Examination Guidelines: A complete evaluation includes B-mode imaging, spectral Doppler, color Doppler, and power Doppler as needed of all accessible portions of each vessel. Bilateral testing is considered an integral part of a complete examination. Limited examinations for reoccurring indications may be performed as noted. The reflux portion of the exam is performed with the patient in reverse Trendelenburg.  +---------+---------------+---------+-----------+----------+--------------+ RIGHT    CompressibilityPhasicitySpontaneityPropertiesThrombus Aging +---------+---------------+---------+-----------+----------+--------------+ CFV      Full           Yes      Yes                                 +---------+---------------+---------+-----------+----------+--------------+ SFJ      Full                                                        +---------+---------------+---------+-----------+----------+--------------+ FV Prox  Full                                                        +---------+---------------+---------+-----------+----------+--------------+ FV Mid   Full                                                        +---------+---------------+---------+-----------+----------+--------------+ FV DistalFull                                                        +---------+---------------+---------+-----------+----------+--------------+ PFV      Full                                                        +---------+---------------+---------+-----------+----------+--------------+  POP      Full           Yes      Yes                                 +---------+---------------+---------+-----------+----------+--------------+ PTV      Full                                                        +---------+---------------+---------+-----------+----------+--------------+ PERO     Full                                                        +---------+---------------+---------+-----------+----------+--------------+   +---------+---------------+---------+-----------+----------+--------------+ LEFT     CompressibilityPhasicitySpontaneityPropertiesThrombus Aging +---------+---------------+---------+-----------+----------+--------------+ CFV      Full           Yes      Yes                                 +---------+---------------+---------+-----------+----------+--------------+ SFJ      Full                                                        +---------+---------------+---------+-----------+----------+--------------+ FV Prox  Full                                                        +---------+---------------+---------+-----------+----------+--------------+ FV Mid   Full                                                        +---------+---------------+---------+-----------+----------+--------------+ FV DistalFull                                                        +---------+---------------+---------+-----------+----------+--------------+ PFV      Full                                                        +---------+---------------+---------+-----------+----------+--------------+ POP      Full           Yes      Yes                                 +---------+---------------+---------+-----------+----------+--------------+  PTV      Full                                                        +---------+---------------+---------+-----------+----------+--------------+ PERO     Full                                                         +---------+---------------+---------+-----------+----------+--------------+     Summary: RIGHT: - There is no evidence of deep vein thrombosis in the lower extremity.  - No cystic structure found in the popliteal fossa.  LEFT: - There is no evidence of deep vein thrombosis in the lower extremity.  - No cystic structure found in the popliteal fossa.  *See table(s) above for measurements and observations. Electronically signed by Deitra Mayo MD on 04/10/2019 at 1:57:33 PM.    Final     Microbiology: No results found for this or any previous visit (from the past 240 hour(s)).   Labs: Basic Metabolic Panel: Recent Labs  Lab 04/09/19 1954 04/09/19 2020 04/11/19 0351 04/11/19 0931 04/12/19 0345  NA 138 139 137  --  137  K 4.0 3.9 3.3*  --  3.6  CL 107 107 103  --  105  CO2 23  --  23  --  22  GLUCOSE 113* 111* 101*  --  94  BUN 6 7 6   --  8  CREATININE 0.93 0.70 0.90  --  0.87  CALCIUM 9.0  --  9.0  --  9.0  MG  --   --   --  2.0  --    Liver Function Tests: Recent Labs  Lab 04/09/19 1954  AST 20  ALT 12  ALKPHOS 64  BILITOT 0.5  PROT 7.2  ALBUMIN 3.9   No results for input(s): LIPASE, AMYLASE in the last 168 hours. No results for input(s): AMMONIA in the last 168 hours. CBC: Recent Labs  Lab 04/09/19 1954 04/09/19 2020 04/10/19 0400 04/11/19 0351 04/12/19 0345  WBC 13.1*  --  13.5* 10.1 9.9  NEUTROABS 10.9*  --  10.9* 7.3  --   HGB 6.7* 9.2* 7.9* 8.0* 7.8*  HCT 25.7* 27.0* 28.4* 27.7* 27.6*  MCV 62.1*  --  65.3* 63.7* 64.3*  PLT 1,072*  --  956* 845* 805*   Cardiac Enzymes: No results for input(s): CKTOTAL, CKMB, CKMBINDEX, TROPONINI in the last 168 hours. BNP: BNP (last 3 results) No results for input(s): BNP in the last 8760 hours.  ProBNP (last 3 results) No results for input(s): PROBNP in the last 8760 hours.  CBG: No results for input(s): GLUCAP in the last 168 hours.     Signed:  Irine Seal MD.  [facility] 04/12/2019, 4:55 PM

## 2019-04-12 NOTE — Progress Notes (Signed)
Discharge instructions reviewed with patient.  These included, but were not limited to, the following:  Medication list, prescriptions, when to call the MD, follow-up appointments, educational materials, symptoms of CVA, etc.  Patient discharged to private residence via private vehicle accompanied by friend.  Escorted to exit via wheelchair by nurse.

## 2019-04-12 NOTE — Progress Notes (Signed)
Occupational Therapy Treatment Patient Details Name: Mallory Robinson MRN: [id number] DOB: 1975-10-24 Today's Date: 04/12/2019    History of present illness 44 y.o. female with a PMHx of depression, bipolar disorder, CVA, HTN and EtOH dependence who presented to the ED for evaluation of left sided sensory numbness with worsened left sided weakness relative to her baseline impairment secondary to a stroke 2 years ago. The neurological symptoms were accompanied by a right retroorbital headache with some slurred speech. CT head negative for acute abnormality, scattered R MCA encephalomalacia stable. Workup for complicated migraine, vs TIA.   OT comments  Pt. Seen for skilled OT treatment session.  Focus remains on visual compensatory strategies for R visual deficits.  Pt. Eager for participation and motivated to incorporate strategies for reading and completion of other self care tasks.  Able to verbalize and demonstrate visual tracking modifications with eye shifts and head turns. Agreeable to outpatient for further therapies.     Follow Up Recommendations  Outpatient OT;Other (comment)    Equipment Recommendations  None recommended by OT    Recommendations for Other Services      Precautions / Restrictions Precautions Precautions: Fall Precaution Comments: R peripheral vision deficit       Mobility Bed Mobility                  Transfers                      Balance                                           ADL either performed or assessed with clinical judgement   ADL                                               Vision Baseline Vision/History: Wears glasses Wears Glasses: At all times Patient Visual Report: Peripheral vision impairment Tracking/Visual Pursuits: Requires cues, head turns, or add eye shifts to track Visual Fields: Right visual field deficit Additional Comments: pt. reports feeling like when she does put  her glasses on (does not appear to have them in the room) that it will make her vision issues worse because "when things are very clear or bright it is harder to see".  states when she closes L eye she can see out of R if she turns and angles head down she can "move the pyamid" and see. "i know it sounds crazy but it works".  assisted pt. with folding paper towel to create a type of window for her to read with.  says she can not have anything visual to the right or below where she is trying to read.  pleased with the modification and was able to read the menu in her room.  provided with some dot to dot number pages to aide in visual tracking.  pt. reports hx. of CVA and recalls needed to track.  "i would have piles of food on one side of my plate and they would say look [employer] look over there you have a ton of food". she laughed and said "i couldnt figure out why i was still hungry if i thought i had just ate".   Perception     Praxis  Cognition Arousal/Alertness: Awake/alert Behavior During Therapy: WFL for tasks assessed/performed Overall Cognitive Status: Within Functional Limits for tasks assessed                                          Exercises     Shoulder Instructions       General Comments  reports she no longer feels comfortable driving.  Also reports wanting to return to work but states she would be nervous she would miss something when she was reading and could make a mistake.  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      Pertinent Vitals/ Pain       Pain Assessment: No/denies pain  Home Living                                          Prior Functioning/Environment              Frequency  Min 2X/week        Progress Toward Goals  OT Goals(current goals can now be found in the care plan section)  Progress towards OT goals: Progressing toward goals     Plan Discharge plan remains appropriate     Co-evaluation                 AM-PAC OT "6 Clicks" Daily Activity     Outcome Measure   Help from another person eating meals?: None Help from another person taking care of personal grooming?: None Help from another person toileting, which includes using toliet, bedpan, or urinal?: None Help from another person bathing (including washing, rinsing, drying)?: None Help from another person to put on and taking off regular upper body clothing?: None Help from another person to put on and taking off regular lower body clothing?: None 6 Click Score: 24    End of Session    OT Visit Diagnosis: Other symptoms and signs involving the nervous system (R29.898)   Activity Tolerance Patient tolerated treatment well   Patient Left in chair;with call bell/phone within reach   Nurse Communication          Time: 0901-0920 OT Time Calculation (min): 19 min  Charges: OT General Charges $OT Visit: 1 Visit OT Treatments $Self Care/Home Management : 8-22 mins  Sonia Baller, COTA/L Acute Rehabilitation [phone]   Janice Coffin 04/12/2019, 12:08 PM

## 2019-04-12 NOTE — Plan of Care (Signed)
  Problem: Acute Rehab PT Goals(only PT should resolve) Goal: Pt Will Go Supine/Side To Sit Outcome: Completed/Met Goal: Patient Will Transfer Sit To/From Stand Outcome: Completed/Met Goal: Pt Will Ambulate Outcome: Completed/Met Goal: Pt Will Go Up/Down Stairs Outcome: Completed/Met Goal: Pt/caregiver will Perform Home Exercise Program Outcome: Completed/Met   Problem: Education: Goal: Knowledge of General Education information will improve Description: Including pain rating scale, medication(s)/side effects and non-pharmacologic comfort measures Outcome: Completed/Met   Problem: Health Behavior/Discharge Planning: Goal: Ability to manage health-related needs will improve Outcome: Completed/Met   Problem: Clinical Measurements: Goal: Ability to maintain clinical measurements within normal limits will improve Outcome: Completed/Met Goal: Will remain free from infection Outcome: Completed/Met Goal: Diagnostic test results will improve Outcome: Completed/Met Goal: Respiratory complications will improve Outcome: Completed/Met Goal: Cardiovascular complication will be avoided Outcome: Completed/Met   Problem: Activity: Goal: Risk for activity intolerance will decrease Outcome: Completed/Met   Problem: Nutrition: Goal: Adequate nutrition will be maintained Outcome: Completed/Met   Problem: Safety: Goal: Ability to remain free from injury will improve Outcome: Completed/Met   Problem: Skin Integrity: Goal: Risk for impaired skin integrity will decrease Outcome: Completed/Met   Problem: Education: Goal: Knowledge of disease or condition will improve Outcome: Completed/Met Goal: Knowledge of secondary prevention will improve Outcome: Completed/Met Goal: Knowledge of patient specific risk factors addressed and post discharge goals established will improve Outcome: Completed/Met Goal: Individualized Educational Video(s) Outcome: Completed/Met   Problem: Health  Behavior/Discharge Planning: Goal: Ability to manage health-related needs will improve Outcome: Completed/Met   Problem: Self-Care: Goal: Ability to participate in self-care as condition permits will improve Outcome: Completed/Met Goal: Ability to communicate needs accurately will improve Outcome: Completed/Met   Problem: Nutrition: Goal: Risk of aspiration will decrease Outcome: Completed/Met   Problem: Ischemic Stroke/TIA Tissue Perfusion: Goal: Complications of ischemic stroke/TIA will be minimized Outcome: Completed/Met   Problem: Acute Rehab OT Goals (only OT should resolve) Goal: OT Additional ADL Goal #1 Outcome: Completed/Met

## 2019-04-12 NOTE — Discharge Instructions (Signed)
Cervicogenic Headache  A cervicogenic headache is a headache caused by a condition that affects the bones and tissues in your neck (cervical spine). In a cervicogenic headache, the pain moves from your neck to your head. Most cervicogenic headaches start in the upper part of the neck with the first three cervical bones (cervical vertebrae). A cervicogenic headache is diagnosed when a cause can be found in the cervical spine and other causes of headaches can be ruled out. What are the causes? The most common cause of this condition is a traumatic injury to the cervical spine, such as whiplash. Other causes include:  Arthritis.  Broken bone (fracture).  Infection.  Tumor. What are the signs or symptoms? The most common symptoms are neck and head pain. The pain is often located on one side. In some cases, there may be head pain without neck pain. Pain may be felt in the neck, back or side of the head, face, or behind the eyes. Other symptoms include:  Limited movement in the neck.  Arm or shoulder pain. How is this diagnosed? This condition may be diagnosed based on:  Your symptoms.  A physical exam.  An injection that blocks nerve signals (nerve block).  Imaging tests, such as: ? X-rays. ? CT scan. ? MRI. How is this treated? Treatment for this condition may depend on the underlying condition. Treatment may include:  Medicines, such as: ? NSAIDs. ? Muscle relaxants.  Physical therapy.  Massage therapy.  Complementary therapies, such as: ? Biofeedback. ? Meditation. ? Acupuncture.  Nerve block injections.  Botulinum toxin injections. Your treatment plan may involve working with a pain management team that includes your primary health care provider, a pain management specialist, a neurologist, and a physical therapist. Follow these instructions at home:  Take over-the-counter and prescription medicines only as told by your health care provider.  Do exercises at  home as told by your physical therapist.  Return to your normal activities as told by your health care provider. Ask your health care provider what activities are safe for you. Avoid activities that trigger your headaches.  Maintain good neck support and posture at home and at work.  Keep all follow-up visits as told by your health care provider. This is important. Contact a health care provider if you have:  Headaches that are getting worse and happening more often.  Headaches with any of the following: ? Fever. ? Numbness. ? Weakness. ? Dizziness. ? Nausea or vomiting. Get help right away if:  You have a very sudden and severe headache. Summary  A cervicogenic headache is a headache caused by a condition that affects the bones and tissues in your cervical spine.  Your health care provider may diagnose this condition with a physical exam, a nerve block, and imaging tests.  Treatment may include medicine to reduce pain and inflammation, physical therapy, and nerve block injections.  Complementary therapies, such as acupuncture and meditation, may be added to other treatments.  Your treatment plan may involve working with a pain management team that includes your primary health care provider, a pain management specialist, a neurologist, and a physical therapist. This information is not intended to replace advice given to you by your health care provider. Make sure you discuss any questions you have with your health care provider. Document Revised: 05/28/2018 Document Reviewed: 02/15/2017 Elsevier Patient Education  2020 [employer]  

## 2019-04-13 ENCOUNTER — Telehealth: Payer: Self-pay | Admitting: Hematology and Oncology

## 2019-04-13 LAB — ANTIPHOSPHOLIPID SYNDROME EVAL, BLD
Anticardiolipin IgA: <9 APL U/mL (ref 0–11)
Anticardiolipin IgG: <9 GPL U/mL (ref 0–14)
Anticardiolipin IgM: 10 MPL U/mL (ref 0–12)
DRVVT: 30.2 s (ref 0.0–47.0)
PTT Lupus Anticoagulant: 35.3 s (ref 0.0–51.9)
Phosphatydalserine, IgA: 1 APS IgA (ref 0–20)
Phosphatydalserine, IgG: 4 GPS IgG (ref 0–11)
Phosphatydalserine, IgM: 18 MPS IgM (ref 0–25)

## 2019-04-13 LAB — HOMOCYSTEINE: Homocysteine: 25.3 umol/L — ABNORMAL HIGH (ref 0.0–14.5)

## 2019-04-13 NOTE — Telephone Encounter (Signed)
Returned the pt's call to schedule an appt w/Dr. Lorenso Courier.

## 2019-04-14 ENCOUNTER — Telehealth: Payer: Self-pay | Admitting: Hematology and Oncology

## 2019-04-14 NOTE — Telephone Encounter (Signed)
Pt cld to schedule a hospital fu w/Dr. Lorenso Courier on 3/3 at 10am. Pt aware to arrive 15 minutes early.

## 2019-04-15 ENCOUNTER — Telehealth: Payer: Self-pay | Admitting: Adult Health

## 2019-04-15 NOTE — Telephone Encounter (Signed)
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 Per KM ok to use a work in [place]. Called and left VM advising pt of new appt and asked to call back with any questions or conflicts FYI

## 2019-04-20 NOTE — Progress Notes (Signed)
Kindly inform the patient that lab work for abnormal clotting tendency was all normal

## 2019-04-21 ENCOUNTER — Telehealth: Payer: Self-pay

## 2019-04-21 NOTE — Progress Notes (Signed)
[place] [phone]   [phone]  PROGRESS NOTE  Patient Care Team: Nolene Ebbs, MD as PCP - General (Internal Medicine)  Hematological/Oncological History # Iron Deficiency Anemia #Thrombocytosis 1) 03/04/2013: WBC 6.7, Hgb 9.6, MCV 63.5, Plt 215 2) 11/10/2017: WBC 16.8, hgb 6.0, Plt 1301, MCV 61.2 3) 04/10/2019: Hematology consulted while patient hospitalized for CVA. WBC 10.1, Hgb 8.0, MCV 63.7, Plt 956. Iron studies showed Iron 16, TIBC 550, Sat 3%, Ferritin 2. 4) 04/11/19: received 1 dose of IV feraheme 510mg   5) 04/22/2019: return clinic visit. WBC 7.0, Hgb 11.6, MCV 74.5, Plt 235. Iron 265, TIBC 425, Sat 62%, Ferritin 139.   Interval History:  Mallory Robinson 44 y.o. female with medical history significant for iron deficiency anemia who presents for a follow up visit. The was last seen during her admission for a CVA in Feb 2021. In the interim since the encounter she was discharged from the hospital on 04/12/2019.   On exam today Mallory Robinson is accompanied by her wife.  She reports that she has been okay since leaving the hospital and is been taking all medications as prescribed.  She has been taking twice daily iron 325 mg daily.  She notes that she has had marked constipation over the last 7 days.  She notes no bowel movements at all during that time..  She is not having any frank abdominal pain.  She notes that she has tried magnesium citrate before in the past and this has been effective.  She is currently been taking senna docusate but this has not relieved constipation as of yet.  On further discussion she reports that she does have follow-up appointment set up with both neurology and OB/GYN.  She has an OB/GYN appointment set up for 05/01/2019 and a neurology appointment on 04/28/2019.  She notes that she has not had any heavy menstrual bleeding since her discharge from the hospital.  She does still have some residual symptoms of the stroke including  visual changes and she walks with a cane with a slight limp.  She does endorse having decreased cravings for ice, but otherwise has been at her baseline level of health.  She denies any fevers, chills, sweats, nausea, vomiting, or diarrhea.  A full 10 point ROS is listed below.  MEDICAL HISTORY:  Past Medical History:  Diagnosis Date   Bipolar 1 disorder ([place])    Complication of anesthesia    Depression    Hypertension    Low vitamin B12 level 04/11/2019   PONV (postoperative nausea and vomiting)    Stroke [facility])     SURGICAL HISTORY: Past Surgical History:  Procedure Laterality Date   CESAREAN SECTION     X2   HERNIA REPAIR     TEE WITHOUT CARDIOVERSION N/A 11/13/2017   Procedure: TRANSESOPHAGEAL ECHOCARDIOGRAM (TEE) bubble study;  Surgeon: Skeet Latch, MD;  Location: [place];  Service: Cardiovascular;  Laterality: N/A;    SOCIAL HISTORY: Social History   Socioeconomic History   Marital status: Married    Spouse name: Not on file   Number of children: Not on file   Years of education: Not on file   Highest education level: Not on file  Occupational History   Not on file  Tobacco Use   Smoking status: Never Smoker   Smokeless tobacco: Never Used  Substance and Sexual Activity   Alcohol use: Yes    Comment: Occasional   Drug use: No   Sexual activity: Yes  Birth control/protection: None  Other Topics Concern   Not on file  Social History Narrative   Not on file   Social Determinants of Health   Financial Resource Strain:    Difficulty of Paying Living Expenses: Not on file  Food Insecurity:    Worried About Charity fundraiser in the Last Year: Not on file   [employer] of Food in the Last Year: Not on file  Transportation Needs:    Lack of Transportation (Medical): Not on file   Lack of Transportation (Non-Medical): Not on file  Physical Activity:    Days of Exercise per Week: Not on file   Minutes of Exercise per  Session: Not on file  Stress:    Feeling of Stress : Not on file  Social Connections:    Frequency of Communication with Friends and Family: Not on file   Frequency of Social Gatherings with Friends and Family: Not on file   Attends Religious Services: Not on file   Active Member of Clubs or Organizations: Not on file   Attends Archivist Meetings: Not on file   Marital Status: Not on file  Intimate Partner Violence:    Fear of Current or Ex-Partner: Not on file   Emotionally Abused: Not on file   Physically Abused: Not on file   Sexually Abused: Not on file    FAMILY HISTORY: Family History  Problem Relation Age of Onset   Hypertension Mother    Stroke Father    Stroke Maternal Aunt     ALLERGIES:  is allergic to food and penicillins.  MEDICATIONS:  Current Outpatient Medications  Medication Sig Dispense Refill   aspirin EC 81 MG tablet Take 81 mg by mouth daily.     ferrous sulfate 325 (65 FE) MG tablet Take 1 tablet (325 mg total) by mouth 2 (two) times daily with a meal. 60 tablet 0   folic acid (FOLVITE) 1 MG tablet Take 2 tablets (2 mg total) by mouth daily. (Patient taking differently: Take 1 mg by mouth daily. ) 30 tablet 0   propranolol (INDERAL) 20 MG tablet Take 1 tablet (20 mg total) by mouth 2 (two) times daily. 60 tablet 4   pyridOXINE (B-6) 50 MG tablet Take 1 tablet (50 mg total) by mouth daily.     senna-docusate (SENOKOT-S) 8.6-50 MG tablet Take 1 tablet by mouth 2 (two) times daily.     vitamin B-12 (CYANOCOBALAMIN) 1000 MCG tablet Take 1 tablet (1,000 mcg total) by mouth daily.     No current facility-administered medications for this visit.    REVIEW OF SYSTEMS:   Constitutional: ( - ) fevers, ( - )  chills , ( - ) night sweats Eyes: ( - ) blurriness of vision, ( - ) double vision, ( - ) watery eyes Ears, nose, mouth, throat, and face: ( - ) mucositis, ( - ) sore throat Respiratory: ( - ) cough, ( - ) dyspnea, ( - )  wheezes Cardiovascular: ( - ) palpitation, ( - ) chest discomfort, ( - ) lower extremity swelling Gastrointestinal:  ( - ) nausea, ( - ) heartburn, ( - ) change in bowel habits Skin: ( - ) abnormal skin rashes Lymphatics: ( - ) new lymphadenopathy, ( - ) easy bruising Neurological: ( - ) numbness, ( - ) tingling, ( - ) new weaknesses Behavioral/Psych: ( - ) mood change, ( - ) new changes  All other systems were reviewed with the patient and  are negative.  PHYSICAL EXAMINATION: ECOG PERFORMANCE STATUS: 1 - Symptomatic but completely ambulatory  Vitals:   04/22/19 1007  BP: (!) 142/92  Pulse: 61  Resp: 20  Temp: 98.2 F (36.8 C)  SpO2: 100%   Filed Weights   04/22/19 1007  Weight: 173 lb 9.6 oz (78.7 kg)    GENERAL: alert, no distress and comfortable SKIN: skin color, texture, turgor are normal, no rashes or significant lesions EYES: conjunctiva are pale and non-injected, sclera clear OROPHARYNX: no exudate, no erythema; lips, buccal mucosa, and tongue normal  NECK: supple, non-tender. Increased submandibular soft tissue, possible enlarged salivary gland vs ectopic thymus  LUNGS: clear to auscultation and percussion with normal breathing effort HEART: regular rate & rhythm and no murmurs and no lower extremity edema Musculoskeletal: no cyanosis of digits and no clubbing  PSYCH: alert & oriented x 3, fluent speech NEURO: no focal motor/sensory deficits  LABORATORY DATA:  I have reviewed the data as listed CBC Latest Ref Rng & Units 04/12/2019 04/11/2019 04/10/2019  WBC 4.0 - 10.5 K/uL 9.9 10.1 13.5(H)  Hemoglobin 12.0 - 15.0 g/dL 7.8(L) 8.0(L) 7.9(L)  Hematocrit 36.0 - 46.0 % 27.6(L) 27.7(L) 28.4(L)  Platelets 150 - 400 K/uL 805(H) 845(H) 956(HH)    CMP Latest Ref Rng & Units 04/12/2019 04/11/2019 04/09/2019  Glucose 70 - 99 mg/dL 94 101(H) 111(H)  BUN 6 - 20 mg/dL 8 6 7   Creatinine 0.44 - 1.00 mg/dL 0.87 0.90 0.70  Sodium 135 - 145 mmol/L 137 137 139  Potassium 3.5 - 5.1  mmol/L 3.6 3.3(L) 3.9  Chloride 98 - 111 mmol/L 105 103 107  CO2 22 - 32 mmol/L 22 23 -  Calcium 8.9 - 10.3 mg/dL 9.0 9.0 -  Total Protein 6.5 - 8.1 g/dL - - -  Total Bilirubin 0.3 - 1.2 mg/dL - - -  Alkaline Phos 38 - 126 U/L - - -  AST 15 - 41 U/L - - -  ALT 0 - 44 U/L - - -     RADIOGRAPHIC STUDIES:  CT Code Stroke CTA Head W/WO contrast  Result Date: 04/09/2019 CLINICAL DATA:  44 year old female with left side numbness. 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 EXAM: CT ANGIOGRAPHY HEAD AND NECK CT PERFUSION BRAIN TECHNIQUE: Multidetector CT imaging of the head and neck was performed using the standard protocol during bolus administration of intravenous contrast. Multiplanar CT image reconstructions and MIPs were obtained to evaluate the vascular anatomy. Carotid stenosis measurements (when applicable) are obtained utilizing NASCET criteria, using the distal internal carotid diameter as the denominator. Multiphase CT imaging of the brain was performed following IV bolus contrast injection. Subsequent parametric perfusion maps were calculated using RAPID software. CONTRAST:  151mL OMNIPAQUE IOHEXOL 350 MG/ML SOLN COMPARISON:  Plain head CT 2033 hours today. FINDINGS: CT Brain Perfusion Findings: ASPECTS: 10 CBF (<30%) Volume: None Perfusion (Tmax>6.0s) volume: None Mismatch Volume: Not applicable Infarction Location:Not applicable CTA NECK Skeleton: No acute osseous abnormality identified. Upper chest: Negative. Other neck: Negative. Aortic arch: 3 vessel arch configuration with no arch or proximal great vessel atherosclerosis. Right carotid system: Negative right CCA. At the right carotid bifurcation there is a subtle persistent small round hypodense area at the posterior bulb measuring about 3 mm (series 7, image 86). This appears unchanged since November. Otherwise the right bifurcation is widely patent. Negative  cervical right ICA otherwise, mild tortuosity distal to the bulb. Left carotid system:  Stable and negative aside from tortuosity. Vertebral arteries: Proximal right subclavian artery and right vertebral artery origin are normal. The right vertebral artery is patent to the skull base with tortuosity but no plaque or stenosis. Proximal left subclavian artery and left vertebral artery origin are normal aside from tortuosity. The left vertebral appears mildly dominant, with V2 segment tortuosity but no plaque or stenosis to the skull base. CTA HEAD Posterior circulation: Tortuous distal vertebral arteries with no plaque or stenosis. Patent PICA origins and vertebrobasilar junction. Tortuous basilar artery is patent without stenosis. Basilar tip, SCA and PCA origins appear stable and within normal limits. Posterior communicating arteries are diminutive or absent. Bilateral PCA branches are stable and within normal limits. Anterior circulation: Both ICA siphons are patent. There is tortuosity and mild irregularity of the left siphon with no discrete plaque or stenosis. This is stable. Right siphon is tortuous without plaque or stenosis. Patent carotid termini. MCA and ACA origins remain normal. Mildly dominant right A1 segment. Tortuous A1s. Anterior communicating artery and bilateral ACA branches are within normal limits. Left MCA M1 segment and bifurcation are patent without stenosis. Left MCA branches are stable. Right MCA M1 segment and bifurcation are patent without stenosis. Right MCA branches are stable. Venous sinuses: Stable patency since November. Dominant appearing right transverse and sigmoid sinuses, with some effacement of those bilateral sinuses. Anatomic variants: Mildly dominant left vertebral artery, right ACA A1 segment. Review of the MIP images confirms the above findings IMPRESSION: 1. Negative for emergent large vessel occlusion, negative CTP, and stable CTA appearance of the head and neck since  November. 2. Unchanged small 3 mm focus of hypodensity in the right ICA bulb, now favored to be a small web or chronic soft plaque rather than adherent thrombus. 3. No other atherosclerosis identified. No significant arterial stenosis. These results were communicated to Dr. Cheral Marker at 10:20 pm on 04/09/2019 by text page via the [facility] messaging system. Electronically Signed   By: Genevie Ann M.D.   On: 04/09/2019 22:24   CT Code Stroke CTA Neck W/WO contrast  Result Date: 04/09/2019 CLINICAL DATA:  44 year old female with left side numbness. 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 EXAM: CT ANGIOGRAPHY HEAD AND NECK CT PERFUSION BRAIN TECHNIQUE: Multidetector CT imaging of the head and neck was performed using the standard protocol during bolus administration of intravenous contrast. Multiplanar CT image reconstructions and MIPs were obtained to evaluate the vascular anatomy. Carotid stenosis measurements (when applicable) are obtained utilizing NASCET criteria, using the distal internal carotid diameter as the denominator. Multiphase CT imaging of the brain was performed following IV bolus contrast injection. Subsequent parametric perfusion maps were calculated using RAPID software. CONTRAST:  119mL OMNIPAQUE IOHEXOL 350 MG/ML SOLN COMPARISON:  Plain head CT 2033 hours today. FINDINGS: CT Brain Perfusion Findings: ASPECTS: 10 CBF (<30%) Volume: None Perfusion (Tmax>6.0s) volume: None Mismatch Volume: Not applicable Infarction Location:Not applicable CTA NECK Skeleton: No acute osseous abnormality identified. Upper chest: Negative. Other neck: Negative. Aortic arch: 3 vessel arch configuration with no arch or proximal great vessel atherosclerosis. Right carotid system: Negative right CCA. At the right carotid bifurcation there is a subtle persistent small round hypodense area at the posterior bulb measuring about 3 mm (series 7, image 86).  This appears unchanged since November. Otherwise the right bifurcation is widely patent. Negative cervical right ICA otherwise, mild tortuosity distal to the bulb. Left carotid system: Stable and negative  aside from tortuosity. Vertebral arteries: Proximal right subclavian artery and right vertebral artery origin are normal. The right vertebral artery is patent to the skull base with tortuosity but no plaque or stenosis. Proximal left subclavian artery and left vertebral artery origin are normal aside from tortuosity. The left vertebral appears mildly dominant, with V2 segment tortuosity but no plaque or stenosis to the skull base. CTA HEAD Posterior circulation: Tortuous distal vertebral arteries with no plaque or stenosis. Patent PICA origins and vertebrobasilar junction. Tortuous basilar artery is patent without stenosis. Basilar tip, SCA and PCA origins appear stable and within normal limits. Posterior communicating arteries are diminutive or absent. Bilateral PCA branches are stable and within normal limits. Anterior circulation: Both ICA siphons are patent. There is tortuosity and mild irregularity of the left siphon with no discrete plaque or stenosis. This is stable. Right siphon is tortuous without plaque or stenosis. Patent carotid termini. MCA and ACA origins remain normal. Mildly dominant right A1 segment. Tortuous A1s. Anterior communicating artery and bilateral ACA branches are within normal limits. Left MCA M1 segment and bifurcation are patent without stenosis. Left MCA branches are stable. Right MCA M1 segment and bifurcation are patent without stenosis. Right MCA branches are stable. Venous sinuses: Stable patency since November. Dominant appearing right transverse and sigmoid sinuses, with some effacement of those bilateral sinuses. Anatomic variants: Mildly dominant left vertebral artery, right ACA A1 segment. Review of the MIP images confirms the above findings IMPRESSION: 1. Negative for  emergent large vessel occlusion, negative CTP, and stable CTA appearance of the head and neck since November. 2. Unchanged small 3 mm focus of hypodensity in the right ICA bulb, now favored to be a small web or chronic soft plaque rather than adherent thrombus. 3. No other atherosclerosis identified. No significant arterial stenosis. These results were communicated to Dr. Cheral Marker at 10:20 pm on 04/09/2019 by text page via the [facility] messaging system. Electronically Signed   By: Genevie Ann M.D.   On: 04/09/2019 22:24   MR BRAIN WO CONTRAST  Result Date: 04/10/2019 CLINICAL DATA:  Left-sided numbness and weakness EXAM: MRI HEAD WITHOUT CONTRAST TECHNIQUE: Multiplanar, multiecho pulse sequences of the brain and surrounding structures were obtained without intravenous contrast. COMPARISON:  Brain MRI 12/27/2018 FINDINGS: BRAIN: There are scattered punctate foci of abnormal diffusion restriction within the right MCA territory. No contralateral diffusion abnormality. There is an old posterior right parietal infarct. Multifocal white matter hyperintensity, most commonly due to chronic ischemic microangiopathy. Normal volume of brain parenchyma and CSF spaces. Midline structures are normal. VASCULAR: Major flow voids are preserved. Unchanged hemosiderin deposition within the posterior right hemisphere. SKULL AND UPPER CERVICAL SPINE: Normal calvarium and skull base. Visualized upper cervical spine and soft tissues are normal. SINUSES/ORBITS: No fluid levels or advanced mucosal thickening. No mastoid or middle ear effusion. Normal orbits. IMPRESSION: 1. Multiple punctate foci of acute ischemia within the right MCA territory. No hemorrhage or mass effect. 2. Old posterior right parietal infarct and findings of mild chronic ischemic microangiopathy. Electronically Signed   By: Ulyses Jarred M.D.   On: 04/10/2019 02:43   CT Code Stroke Cerebral Perfusion with contrast  Result Date: 04/09/2019 CLINICAL DATA:  44 year old  female with left side numbness. 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 EXAM: CT ANGIOGRAPHY HEAD AND NECK CT PERFUSION BRAIN TECHNIQUE: Multidetector CT imaging of the head and neck was performed using the standard protocol during bolus  administration of intravenous contrast. Multiplanar CT image reconstructions and MIPs were obtained to evaluate the vascular anatomy. Carotid stenosis measurements (when applicable) are obtained utilizing NASCET criteria, using the distal internal carotid diameter as the denominator. Multiphase CT imaging of the brain was performed following IV bolus contrast injection. Subsequent parametric perfusion maps were calculated using RAPID software. CONTRAST:  154mL OMNIPAQUE IOHEXOL 350 MG/ML SOLN COMPARISON:  Plain head CT 2033 hours today. FINDINGS: CT Brain Perfusion Findings: ASPECTS: 10 CBF (<30%) Volume: None Perfusion (Tmax>6.0s) volume: None Mismatch Volume: Not applicable Infarction Location:Not applicable CTA NECK Skeleton: No acute osseous abnormality identified. Upper chest: Negative. Other neck: Negative. Aortic arch: 3 vessel arch configuration with no arch or proximal great vessel atherosclerosis. Right carotid system: Negative right CCA. At the right carotid bifurcation there is a subtle persistent small round hypodense area at the posterior bulb measuring about 3 mm (series 7, image 86). This appears unchanged since November. Otherwise the right bifurcation is widely patent. Negative cervical right ICA otherwise, mild tortuosity distal to the bulb. Left carotid system: Stable and negative aside from tortuosity. Vertebral arteries: Proximal right subclavian artery and right vertebral artery origin are normal. The right vertebral artery is patent to the skull base with tortuosity but no plaque or stenosis. Proximal left subclavian artery and left vertebral artery origin are normal aside from  tortuosity. The left vertebral appears mildly dominant, with V2 segment tortuosity but no plaque or stenosis to the skull base. CTA HEAD Posterior circulation: Tortuous distal vertebral arteries with no plaque or stenosis. Patent PICA origins and vertebrobasilar junction. Tortuous basilar artery is patent without stenosis. Basilar tip, SCA and PCA origins appear stable and within normal limits. Posterior communicating arteries are diminutive or absent. Bilateral PCA branches are stable and within normal limits. Anterior circulation: Both ICA siphons are patent. There is tortuosity and mild irregularity of the left siphon with no discrete plaque or stenosis. This is stable. Right siphon is tortuous without plaque or stenosis. Patent carotid termini. MCA and ACA origins remain normal. Mildly dominant right A1 segment. Tortuous A1s. Anterior communicating artery and bilateral ACA branches are within normal limits. Left MCA M1 segment and bifurcation are patent without stenosis. Left MCA branches are stable. Right MCA M1 segment and bifurcation are patent without stenosis. Right MCA branches are stable. Venous sinuses: Stable patency since November. Dominant appearing right transverse and sigmoid sinuses, with some effacement of those bilateral sinuses. Anatomic variants: Mildly dominant left vertebral artery, right ACA A1 segment. Review of the MIP images confirms the above findings IMPRESSION: 1. Negative for emergent large vessel occlusion, negative CTP, and stable CTA appearance of the head and neck since November. 2. Unchanged small 3 mm focus of hypodensity in the right ICA bulb, now favored to be a small web or chronic soft plaque rather than adherent thrombus. 3. No other atherosclerosis identified. No significant arterial stenosis. These results were communicated to Dr. Cheral Marker at 10:20 pm on 04/09/2019 by text page via the [facility] messaging system. Electronically Signed   By: Genevie Ann M.D.   On: 04/09/2019 22:24     VAS [place] TRANSCRANIAL DOPPLER W BUBBLES  Result Date: 04/13/2019  Transcranial Doppler with Bubble Indications: Stroke. Comparison Study: No prior study Performing Technologist: Maudry Mayhew MHA, RDMS, RVT, RDCS  Examination Guidelines: A complete evaluation includes B-mode imaging, spectral Doppler, color Doppler, and power Doppler as needed of all accessible portions of each vessel. Bilateral testing is considered an integral part of a complete examination. Limited  examinations for reoccurring indications may be performed as noted.  Summary:  A vascular evaluation was performed. The right middle cerebral artery was studied. An IV was inserted into the patient's right forearm. Verbal informed consent was obtained.  No evidence of HITS (high intensity transient signals) at rest or with Valsalva maneuver. Therefore, there is no evidence of clinically significant PFO (patent foramen ovale). Negative TCD Bubble study *See table(s) above for TCD measurements and observations.  Diagnosing physician: Antony Contras MD Electronically signed by Antony Contras MD on 04/13/2019 at 8:20:26 AM.    Final    CT HEAD CODE STROKE WO CONTRAST  Result Date: 04/09/2019 CLINICAL DATA:  Code stroke. 44 year old female with left side numbness. 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 EXAM: CT HEAD WITHOUT CONTRAST TECHNIQUE: Contiguous axial images were obtained from the base of the skull through the vertex without intravenous contrast. COMPARISON:  Head CT without contrast 12/28/2018. Brain MRI and intracranial MRA, CTA head and neck 12/27/2018. 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 No midline shift, ventriculomegaly, mass effect, evidence of mass lesion, intracranial hemorrhage or evidence of cortically based acute infarction. Gray-white matter differentiation in  the left hemisphere and posterior fossa remains normal. Vascular: No suspicious intracranial vascular hyperdensity. Skull: No acute osseous abnormality identified. Sinuses/Orbits: Visualized paranasal sinuses and mastoids are stable and well pneumatized. Other: Visualized orbits and scalp soft tissues are within normal limits. ASPECTS [facility] Stroke Program Early CT Score) Total score (0-10 with 10 being normal): 10 IMPRESSION: 1. No acute cortically based infarct or acute intracranial hemorrhage identified. 2. 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 ASPECTS 10. 3. These results were communicated to Dr. Cheral Marker at 8:45 pm on 04/09/2019 by text page via the [facility] messaging system. Electronically Signed   By: Genevie Ann M.D.   On: 04/09/2019 20:45   VAS [place] LOWER EXTREMITY VENOUS (DVT)  Result Date: 04/10/2019  Lower Venous DVTStudy Indications: Stroke.  Comparison Study: No prior study Performing Technologist: Maudry Mayhew MHA, RDMS, RVT, RDCS  Examination Guidelines: A complete evaluation includes B-mode imaging, spectral Doppler, color Doppler, and power Doppler as needed of all accessible portions of each vessel. Bilateral testing is considered an integral part of a complete examination. Limited examinations for reoccurring indications may be performed as noted. The reflux portion of the exam is performed with the patient in reverse Trendelenburg.  +---------+---------------+---------+-----------+----------+--------------+  RIGHT     Compressibility Phasicity Spontaneity Properties Thrombus Aging  +---------+---------------+---------+-----------+----------+--------------+  CFV       Full            Yes       Yes                                    +---------+---------------+---------+-----------+----------+--------------+  SFJ       Full                                                              +---------+---------------+---------+-----------+----------+--------------+  FV Prox   Full                                                             +---------+---------------+---------+-----------+----------+--------------+  FV Mid    Full                                                             +---------+---------------+---------+-----------+----------+--------------+  FV Distal Full                                                             +---------+---------------+---------+-----------+----------+--------------+  PFV       Full                                                             +---------+---------------+---------+-----------+----------+--------------+  POP       Full            Yes       Yes                                    +---------+---------------+---------+-----------+----------+--------------+  PTV       Full                                                             +---------+---------------+---------+-----------+----------+--------------+  PERO      Full                                                             +---------+---------------+---------+-----------+----------+--------------+   +---------+---------------+---------+-----------+----------+--------------+  LEFT      Compressibility Phasicity Spontaneity Properties Thrombus Aging  +---------+---------------+---------+-----------+----------+--------------+  CFV       Full            Yes       Yes                                    +---------+---------------+---------+-----------+----------+--------------+  SFJ       Full                                                             +---------+---------------+---------+-----------+----------+--------------+  FV Prox   Full                                                             +---------+---------------+---------+-----------+----------+--------------+  FV Mid    Full                                                              +---------+---------------+---------+-----------+----------+--------------+  FV Distal Full                                                             +---------+---------------+---------+-----------+----------+--------------+  PFV       Full                                                             +---------+---------------+---------+-----------+----------+--------------+  POP       Full            Yes       Yes                                    +---------+---------------+---------+-----------+----------+--------------+  PTV       Full                                                             +---------+---------------+---------+-----------+----------+--------------+  PERO      Full                                                             +---------+---------------+---------+-----------+----------+--------------+     Summary: RIGHT: - There is no evidence of deep vein thrombosis in the lower extremity.  - No cystic structure found in the popliteal fossa.  LEFT: - There is no evidence of deep vein thrombosis in the lower extremity.  - No cystic structure found in the popliteal fossa.  *See table(s) above for measurements and observations. Electronically signed by Deitra Mayo MD on 04/10/2019 at 1:57:33 PM.    Final     ASSESSMENT & PLAN Mallory Robinson 44 y.o. female with medical history significant for iron deficiency anemia who presents for a follow up visit.  In the interim since her last visit Mallory Robinson has been taking twice daily iron and unfortunately has developed constipation.  I have recommended that she take magnesium citrate in order to help relieve this constipation.  Additionally I would recommend that she reduce her iron dosing down to every other day in order to help relieve the constipation.  Symptomatically the patient has had an improvement in energy and no longer craves ice.  She received only 1 dose of IV Feraheme while in-house and we are  prepared to offer her a second dose  in the event that her iron levels remain low.  In the interim I would recommend that she continue p.o. iron dosed every other day, but this can be increased to daily once her bowels are moving regularly.  At this time I would recommend that we continue to monitor her in our clinic until her hemoglobin levels have improved.  Additionally I am reassured that she has an upcoming OB/GYN appointment later this month in order to help control her heavy GYN bleeding.  # Iron Deficiency Anemia Secondary to GYN Blood Loss # Thrombocytosis, Secondary to Iron Deficiency --today will recheck CBC, CMP, retic panel, and iron studies --change dosing of PO iron to 325mg  QOD until constipation resolves. Can do daily PO 325mg  once bowels are moving regularly. Recommend she take this with a source of vitamin C (orange juice) to improve absorption --after review of labs today will determine if patient requires a 2nd dose of Feraheme 510mg  IV --agree with outpatient visit to GYN for management of heavy menstrual cycles --RTC in 2 months to continue monitoring her Hgb levels and assure improvement with GYN bleeding.   #Constipation --no BM x 7 days --recommend continued senna-docusate, with the addition of magnesium citrate to help move her bowels.  -- additionally can consider miralax to help move bowels --continue to monitor   Orders Placed This Encounter  Procedures   CBC with Differential ([place] Only)    Standing Status:   Future    Standing Expiration Date:   04/21/2020   Retic Panel    Standing Status:   Future    Standing Expiration Date:   04/21/2020   CMP ([place] only)    Standing Status:   Future    Standing Expiration Date:   04/21/2020   Iron and TIBC    Standing Status:   Future    Standing Expiration Date:   04/21/2020   Ferritin    Standing Status:   Future    Standing Expiration Date:   04/21/2020    All questions were answered. The patient knows to call the clinic with any  problems, questions or concerns.  A total of more than 30 minutes were spent on this encounter and over half of that time was spent on counseling and coordination of care as outlined above.   Ledell Peoples, MD Department of Hematology/Oncology [place] at [facility] Phone: [phone] Pager: [phone] Email: [email or web address]  04/22/2019 10:52 AM

## 2019-04-21 NOTE — Telephone Encounter (Signed)
I reached out to the pt and was able to advised on lab results.  She verbalized understanding.

## 2019-04-21 NOTE — Telephone Encounter (Signed)
-----   Message from Garvin Fila, MD sent at 04/20/2019  2:02 PM EST ----- Kindly inform the patient that lab work for abnormal clotting tendency was all normal

## 2019-04-22 ENCOUNTER — Encounter: Payer: Self-pay | Admitting: Hematology and Oncology

## 2019-04-22 ENCOUNTER — Telehealth: Payer: Self-pay

## 2019-04-22 ENCOUNTER — Inpatient Hospital Stay: Payer: Medicaid Other | Attending: Hematology and Oncology | Admitting: Hematology and Oncology

## 2019-04-22 ENCOUNTER — Other Ambulatory Visit: Payer: 59

## 2019-04-22 ENCOUNTER — Other Ambulatory Visit: Payer: Self-pay

## 2019-04-22 ENCOUNTER — Inpatient Hospital Stay: Payer: Medicaid Other

## 2019-04-22 ENCOUNTER — Other Ambulatory Visit: Payer: Self-pay | Admitting: Lab

## 2019-04-22 VITALS — BP 142/92 | HR 61 | Temp 98.2°F | Resp 20 | Ht 63.0 in | Wt 173.6 lb

## 2019-04-22 DIAGNOSIS — K5900 Constipation, unspecified: Secondary | ICD-10-CM | POA: Insufficient documentation

## 2019-04-22 DIAGNOSIS — D473 Essential (hemorrhagic) thrombocythemia: Secondary | ICD-10-CM | POA: Diagnosis not present

## 2019-04-22 DIAGNOSIS — I639 Cerebral infarction, unspecified: Secondary | ICD-10-CM | POA: Diagnosis not present

## 2019-04-22 DIAGNOSIS — D500 Iron deficiency anemia secondary to blood loss (chronic): Secondary | ICD-10-CM | POA: Diagnosis not present

## 2019-04-22 DIAGNOSIS — N920 Excessive and frequent menstruation with regular cycle: Secondary | ICD-10-CM | POA: Insufficient documentation

## 2019-04-22 LAB — CMP (CANCER CENTER ONLY)
ALT: 12 U/L (ref 0–44)
AST: 14 U/L — ABNORMAL LOW (ref 15–41)
Albumin: 4.7 g/dL (ref 3.5–5.0)
Alkaline Phosphatase: 70 U/L (ref 38–126)
Anion gap: 9 (ref 5–15)
BUN: 10 mg/dL (ref 6–20)
CO2: 24 mmol/L (ref 22–32)
Calcium: 10 mg/dL (ref 8.9–10.3)
Chloride: 105 mmol/L (ref 98–111)
Creatinine: 1.03 mg/dL — ABNORMAL HIGH (ref 0.44–1.00)
GFR, Est AFR Am: >60 mL/min (ref >60)
GFR, Estimated: >60 mL/min (ref >60)
Glucose, Bld: 97 mg/dL (ref 70–99)
Potassium: 4 mmol/L (ref 3.5–5.1)
Sodium: 138 mmol/L (ref 135–145)
Total Bilirubin: 0.7 mg/dL (ref 0.3–1.2)
Total Protein: 8.1 g/dL (ref 6.5–8.1)

## 2019-04-22 LAB — CBC WITH DIFFERENTIAL (CANCER CENTER ONLY)
Abs Immature Granulocytes: 0.02 10*3/uL (ref 0.00–0.07)
Basophils Absolute: 0.1 10*3/uL (ref 0.0–0.1)
Basophils Relative: 1 %
Eosinophils Absolute: 0.1 10*3/uL (ref 0.0–0.5)
Eosinophils Relative: 1 %
HCT: 40.7 % (ref 36.0–46.0)
Hemoglobin: 11.6 g/dL — ABNORMAL LOW (ref 12.0–15.0)
Immature Granulocytes: 0 %
Lymphocytes Relative: 27 %
Lymphs Abs: 1.9 10*3/uL (ref 0.7–4.0)
MCH: 21.2 pg — ABNORMAL LOW (ref 26.0–34.0)
MCHC: 28.5 g/dL — ABNORMAL LOW (ref 30.0–36.0)
MCV: 74.5 fL — ABNORMAL LOW (ref 80.0–100.0)
Monocytes Absolute: 0.4 10*3/uL (ref 0.1–1.0)
Monocytes Relative: 6 %
Neutro Abs: 4.6 10*3/uL (ref 1.7–7.7)
Neutrophils Relative %: 65 %
Platelet Count: 235 10*3/uL (ref 150–400)
RBC: 5.46 MIL/uL — ABNORMAL HIGH (ref 3.87–5.11)
RDW: 36.3 % — ABNORMAL HIGH (ref 11.5–15.5)
WBC Count: 7 10*3/uL (ref 4.0–10.5)
nRBC: 0 % (ref 0.0–0.2)

## 2019-04-22 LAB — IRON AND TIBC
Iron: 265 ug/dL — ABNORMAL HIGH (ref 41–142)
Saturation Ratios: 62 % — ABNORMAL HIGH (ref 21–57)
TIBC: 425 ug/dL (ref 236–444)
UIBC: 160 ug/dL (ref 120–384)

## 2019-04-22 LAB — RETIC PANEL
Immature Retic Fract: 31.8 % — ABNORMAL HIGH (ref 2.3–15.9)
RBC.: 5.4 MIL/uL — ABNORMAL HIGH (ref 3.87–5.11)
Retic Count, Absolute: 9.3 10*3/uL — ABNORMAL LOW (ref 19.0–186.0)
Retic Ct Pct: 0.9 % (ref 0.4–3.1)
Reticulocyte Hemoglobin: 33.4 pg (ref >27.9)

## 2019-04-22 LAB — FERRITIN: Ferritin: 139 ng/mL (ref 11–307)

## 2019-04-22 NOTE — Telephone Encounter (Signed)
TCT patient regarding her lab results. Informed her that: 1) Her Hgb has improved from 7.8 up to 11.6!  2) Platelet have dropped from 800 down to 235 (normal)  3) Iron levels look great.   Encouraged her to continue taking her PO iron pills, no IV iron is needed. We will f/u with her in 2 months.  Patient verbalized understanding.

## 2019-04-22 NOTE — Telephone Encounter (Signed)
-----   Message from Otila Kluver, RN sent at 04/22/2019  2:48 PM EST -----  ----- Message ----- From: Orson Slick, MD Sent: 04/22/2019  12:59 PM EST To: Otila Kluver, RN  Please call Mrs. Kurzawa to let her know: 1) Her Hgb has improved from 7.8 up to 11.6! 2) Platelet have dropped from 800 down to 235 (normal) 3) Iron levels look great.  Please have her continue taking her PO iron pills, no IV iron is needed. We will f/u with her in 2 months.  Colan Neptune  ----- Message ----- From: Buel Ream, [facility] [place] Sent: 04/22/2019  11:24 AM EST To: Orson Slick, MD

## 2019-04-23 ENCOUNTER — Telehealth: Payer: Self-pay | Admitting: Hematology and Oncology

## 2019-04-23 NOTE — Telephone Encounter (Signed)
Scheduled per los. Called and spoke with patient. Confirmed appt 

## 2019-04-28 ENCOUNTER — Ambulatory Visit: Payer: Medicaid Other | Admitting: Neurology

## 2019-05-04 ENCOUNTER — Ambulatory Visit: Payer: Medicaid Other | Admitting: Obstetrics and Gynecology

## 2019-05-04 DIAGNOSIS — Z0289 Encounter for other administrative examinations: Secondary | ICD-10-CM

## 2019-05-20 ENCOUNTER — Ambulatory Visit: Payer: Medicaid Other | Admitting: Adult Health

## 2019-05-30 ENCOUNTER — Ambulatory Visit: Payer: Medicaid Other | Attending: Internal Medicine

## 2019-05-30 DIAGNOSIS — Z23 Encounter for immunization: Secondary | ICD-10-CM

## 2019-05-30 NOTE — Progress Notes (Signed)
   Covid-19 Vaccination Clinic  Name:  Mallory Robinson    MRN: [id number] DOB: [birth year]  05/30/2019  [name] was observed post Covid-19 immunization for 15 minutes without incident. She was provided with Vaccine Information Sheet and instruction to access the V-Safe system.   [name] was instructed to call 911 with any severe reactions post vaccine: Marland Kitchen Difficulty breathing  . Swelling of face and throat  . A fast heartbeat  . A bad rash all over body  . Dizziness and weakness   Immunizations Administered    Name Date Dose VIS Date Route   Pfizer COVID-19 Vaccine 05/30/2019  8:16 AM 0.3 mL 01/30/2019 Intramuscular   Manufacturer: [place]   Lot: [id number]   [place]: [id number]

## 2019-06-09 ENCOUNTER — Ambulatory Visit: Payer: Medicaid Other | Admitting: Neurology

## 2019-06-09 ENCOUNTER — Encounter: Payer: Self-pay | Admitting: Neurology

## 2019-06-09 ENCOUNTER — Other Ambulatory Visit: Payer: Self-pay

## 2019-06-09 ENCOUNTER — Telehealth: Payer: Self-pay | Admitting: Neurology

## 2019-06-09 VITALS — BP 136/97 | HR 71 | Temp 97.1°F | Ht 63.0 in | Wt 171.0 lb

## 2019-06-09 DIAGNOSIS — I6521 Occlusion and stenosis of right carotid artery: Secondary | ICD-10-CM

## 2019-06-09 DIAGNOSIS — I63411 Cerebral infarction due to embolism of right middle cerebral artery: Secondary | ICD-10-CM

## 2019-06-09 NOTE — Patient Instructions (Signed)
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  Check repeat CT angiogram of the neck to look for resolution of the thrombus.  If thrombus is increased in size may need to be more aggressive and switch aspirin to stronger anticoagulant.  Continue aggressive risk factor modification with strict control of hypertension with blood pressure goal below 130/90, lipids with LDL cholesterol goal below 70 mg percent and diabetes with hemoglobin A1c goal below 6.5%.  She was encouraged to eat a healthy diet with lots of fruits, vegetables, cereals and whole grains and to exercise regularly and lose weight.  She will return for follow-up in the future in 6 months with my nurse practitioner Janett Billow or call earlier if necessary.  Stroke Prevention Some medical conditions and behaviors are associated with a higher chance of having a stroke. You can help prevent a stroke by making nutrition, lifestyle, and other changes, including managing any medical conditions you may have. What nutrition changes can be made?   Eat healthy foods. You can do this by: ? Choosing foods high in fiber, such as fresh fruits and vegetables and whole grains. ? Eating at least 5 or more servings of fruits and vegetables a day. Try to fill half of your plate at each meal with fruits and vegetables. ? Choosing lean protein foods, such as lean cuts of meat, poultry without skin, fish, tofu, beans, and nuts. ? Eating low-fat dairy products. ? Avoiding foods that are high in salt (sodium). This can help lower blood pressure. ? Avoiding foods that have saturated fat, trans fat, and cholesterol. This can help prevent high cholesterol. ? Avoiding processed and premade foods.  Follow your health care provider's specific guidelines for losing weight, controlling high blood pressure (hypertension), lowering high cholesterol, and  managing diabetes. These may include: ? Reducing your daily calorie intake. ? Limiting your daily sodium intake to 1,500 milligrams (mg). ? Using only healthy fats for cooking, such as olive oil, canola oil, or sunflower oil. ? Counting your daily carbohydrate intake. What lifestyle changes can be made?  Maintain a healthy weight. Talk to your health care provider about your ideal weight.  Get at least 30 minutes of moderate physical activity at least 5 days a week. Moderate activity includes brisk walking, biking, and swimming.  Do not use any products that contain nicotine or tobacco, such as cigarettes and e-cigarettes. If you need help quitting, ask your health care provider. It may also be helpful to avoid exposure to secondhand smoke.  Limit alcohol intake to no more than 1 drink a day for nonpregnant women and 2 drinks a day for men. One drink equals 12 oz of beer, 5 oz of wine, or 1 oz of hard liquor.  Stop any illegal drug use.  Avoid taking birth control pills. Talk to your health care provider about the risks of taking birth control pills if: ? You are over 66 years old. ? You smoke. ? You get migraines. ? You have ever had a blood clot. What other changes can be made?  Manage your cholesterol levels. ? Eating a healthy diet is important for preventing high cholesterol. If cholesterol cannot be managed through diet alone, you may also need to take medicines. ? Take any prescribed medicines to control your cholesterol as told by your health care provider.  Manage your diabetes. ? Eating a healthy diet and exercising regularly  are important parts of managing your blood sugar. If your blood sugar cannot be managed through diet and exercise, you may need to take medicines. ? Take any prescribed medicines to control your diabetes as told by your health care provider.  Control your hypertension. ? To reduce your risk of stroke, try to keep your blood pressure below  130/80. ? Eating a healthy diet and exercising regularly are an important part of controlling your blood pressure. If your blood pressure cannot be managed through diet and exercise, you may need to take medicines. ? Take any prescribed medicines to control hypertension as told by your health care provider. ? Ask your health care provider if you should monitor your blood pressure at home. ? Have your blood pressure checked every year, even if your blood pressure is normal. Blood pressure increases with age and some medical conditions.  Get evaluated for sleep disorders (sleep apnea). Talk to your health care provider about getting a sleep evaluation if you snore a lot or have excessive sleepiness.  Take over-the-counter and prescription medicines only as told by your health care provider. Aspirin or blood thinners (antiplatelets or anticoagulants) may be recommended to reduce your risk of forming blood clots that can lead to stroke.  Make sure that any other medical conditions you have, such as atrial fibrillation or atherosclerosis, are managed. What are the warning signs of a stroke? The warning signs of a stroke can be easily remembered as BEFAST.  B is for balance. Signs include: ? Dizziness. ? Loss of balance or coordination. ? Sudden trouble walking.  E is for eyes. Signs include: ? A sudden change in vision. ? Trouble seeing.  F is for face. Signs include: ? Sudden weakness or numbness of the face. ? The face or eyelid drooping to one side.  A is for arms. Signs include: ? Sudden weakness or numbness of the arm, usually on one side of the body.  S is for speech. Signs include: ? Trouble speaking (aphasia). ? Trouble understanding.  T is for time. ? These symptoms may represent a serious problem that is an emergency. Do not wait to see if the symptoms will go away. Get medical help right away. Call your local emergency services (911 in the [place] Do not drive yourself to the  hospital.  Other signs of stroke may include: ? A sudden, severe headache with no known cause. ? Nausea or vomiting. ? Seizure. Where to find more information For more information, visit:  American Stroke Association: www.strokeassociation.org  National Stroke Association: www.stroke.org Summary  You can prevent a stroke by eating healthy, exercising, not smoking, limiting alcohol intake, and managing any medical conditions you may have.  Do not use any products that contain nicotine or tobacco, such as cigarettes and e-cigarettes. If you need help quitting, ask your health care provider. It may also be helpful to avoid exposure to secondhand smoke.  Remember BEFAST for warning signs of stroke. Get help right away if you or a loved one has any of these signs. This information is not intended to replace advice given to you by your health care provider. Make sure you discuss any questions you have with your health care provider. Document Revised: 01/18/2017 Document Reviewed: 03/13/2016 Elsevier Patient Education  2020 [employer].

## 2019-06-09 NOTE — Progress Notes (Signed)
[name] [address] [place]. [place] [address] [phone]       [place]  Mallory Robinson Date of Birth:  1975-06-26 Medical Record Number:  [id number]   Reason for Referral:  hospital stroke follow up    CHIEF COMPLAINT:  Chief Complaint  Patient presents with  . Follow-up    Patient in research study neurology follow up room 1 pt alone pt hospital visit in 03/2019    HPI: Mallory Westbrooks Smithis being seen today for in office hospital follow-up regarding suspected stroke treated with TPA on 12/27/2018 versus possible complicated migraines.  History obtained from patient, wife and chart review. Reviewed all radiology images and labs personally.  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  She received IV tPA for suspected stroke.  Felt as though left-sided weakness and sensory changes may be complicated migraine episodes especially with mild headaches after episodes with history of migraines and similar visual disturbances.  Also per note, admitted to being under significant stress with recently starting a new job.  CT head and MRI brain negative for acute stroke.  MRA head unremarkable.  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  CT perfusion unremarkable.  2D echo normal EF without cardiac source of embolus identified.  EEG negative for seizures.  Initiated Topamax 50 mg twice daily with improvement headaches.  Recommended restarting  aspirin 81 mg daily for secondary stroke prevention.  HTN stable.  LDL 32.  Other stroke risk factors include EtOH use, obesity, history of stroke/TIA and family history of stroke.  Recommended outpatient therapy and discharged in stable condition.  Mallory Robinson is being seen today, 02/10/2019, for hospital follow-up accompanied by her wife.  Prior stroke deficits of left hemiparesis and balance impairment greatly improved previously with minimal to no residual deficits but reports worsening left hemiparesis, balance and dizziness after recent admission along with left-sided hypersensitivity.  She also complains of short-term memory loss feeling "scatterbrained" and has difficulty focusing.  She is able to maintain doing ADLs and majority of IADLs independently.  She was working with outpatient PT/ST after discharge but currently on hold as she was diagnosed with COVID-19 on 01/26/2019 and was quarantined until recently.  She denies any residual symptoms from COVID-19 but does report increased fatigue and slightly worsening left-sided weakness.  She has continued on Topamax 50 mg twice daily but continues to experience visual aura migraines on a daily basis consisting of flashing lights in right periphery followed by a headache associated with nausea/vomiting, photophobia and phonophobia.  Migraine will subside after resting in a dark quiet area.  She has not experienced any reoccurring worsening left leg weakness accompanied with migraine.  She has not used any ibuprofen or acetaminophen.  She does endorse increased anxiety and stress due to medical condition with underlying history of bipolar disorder.  She does not currently follow with psychiatry.  She has continued on aspirin 325 mg daily without bleeding or bruising.  Blood pressure today 124/86.  No further concerns at this time.  Update 06/09/2019 : She returns  for follow-up after last office visit in October 2019.  She has been hospitalized twice in November  2020 as well as in February 2021.  She was admitted with left-sided weakness for strokelike episode in November 2020 and treated with TPA.  MRI was negative for acute infarct at that time.  CT head and neck showed recurrence of 2 mm right carotid thrombus.  Echocardiogram was unremarkable LDL cholesterol was 32 mg percent and hemoglobin A1c was 5.6.  She she also had headaches and was discharged on aspirin for stroke and Topamax for migraine prevention.  This time she was readmitted on 04/09/2019 with left-sided numbness as well as increased weakness and headaches.  MRI scan showed small punctate infarcts in the right MCA territory and CT angiogram of the head and neck again showed unchanged appearance of the right carotid bulb thrombus.  2D echo showed was not repeated since 20 November had normal ejection fraction.  Transcranial Doppler bubble study was negative.  LDL cholesterol was 27 mg percent hemoglobin A1c was 5.5.  Antiphospholipid antibodies were negative.  Patient had been on aspirin 81 prior to admission but since she has significant anemia during this admission requiring iron transfusion dual antiplatelet therapy was not initiated.  Patient states she is doing much better her last hemoglobin on 04/22/2019 was 11.6 and hematocrit 40.7.  Her iron studies also showed significant improvement compared to February.  She is feeling much better with more stamina.  She is also had no recurrent headaches or migraines.  She in fact has no deficits.  She has no complaints today.  ROS:   14 system review of systems performed and negative with exception of vision difficulty, flashes of light, anemia, bleeding and all other systems negative  PMH:  Past Medical History:  Diagnosis Date  . Bipolar 1 disorder ([place])   . Complication of anesthesia   . Depression   . Hypertension   . Low vitamin B12 level 04/11/2019  . PONV (postoperative nausea and vomiting)   . Stroke [facility])     PSH:  Past Surgical History:    Procedure Laterality Date  . CESAREAN SECTION     X2  . HERNIA REPAIR    . TEE WITHOUT CARDIOVERSION N/A 11/13/2017   Procedure: TRANSESOPHAGEAL ECHOCARDIOGRAM (TEE) bubble study;  Surgeon: Skeet Latch, MD;  Location: [facility];  Service: Cardiovascular;  Laterality: N/A;    Social History:  Social History   Socioeconomic History  . Marital status: Married    Spouse name: Not on file  . Number of children: Not on file  . Years of education: Not on file  . Highest education level: Not on file  Occupational History  . Not on file  Tobacco Use  . Smoking status: Never Smoker  . Smokeless tobacco: Never Used  Substance and Sexual Activity  . Alcohol use: Yes    Comment: Occasional  . Drug use: No  . Sexual activity: Yes    Birth control/protection: None  Other Topics Concern  . Not on file  Social History Narrative  . Not on file   Social Determinants of Health   Financial Resource Strain:   . Difficulty of Paying Living Expenses:   Food Insecurity:   . Worried About Charity fundraiser in the Last Year:   . Arboriculturist in the Last Year:   Transportation Needs:   . Film/video editor (Medical):   Marland Kitchen Lack of Transportation (Non-Medical):   Physical Activity:   .  Days of Exercise per Week:   . Minutes of Exercise per Session:   Stress:   . Feeling of Stress :   Social Connections:   . Frequency of Communication with Friends and Family:   . Frequency of Social Gatherings with Friends and Family:   . Attends Religious Services:   . Active Member of Clubs or Organizations:   . Attends Archivist Meetings:   Marland Kitchen Marital Status:   Intimate Partner Violence:   . Fear of Current or Ex-Partner:   . Emotionally Abused:   Marland Kitchen Physically Abused:   . Sexually Abused:     Family History:  Family History  Problem Relation Age of Onset  . Hypertension Mother   . Stroke Father   . Stroke Maternal Aunt     Medications:   Current Outpatient  Medications on File Prior to Visit  Medication Sig Dispense Refill  . aspirin EC 81 MG tablet Take 81 mg by mouth daily.    . ferrous sulfate 325 (65 FE) MG tablet Take 1 tablet (325 mg total) by mouth 2 (two) times daily with a meal. 60 tablet 0  . folic acid (FOLVITE) 1 MG tablet Take 2 tablets (2 mg total) by mouth daily. (Patient taking differently: Take 1 mg by mouth daily. ) 30 tablet 0  . propranolol (INDERAL) 20 MG tablet Take 1 tablet (20 mg total) by mouth 2 (two) times daily. 60 tablet 4  . pyridOXINE (B-6) 50 MG tablet Take 1 tablet (50 mg total) by mouth daily.    Marland Kitchen senna-docusate (SENOKOT-S) 8.6-50 MG tablet Take 1 tablet by mouth 2 (two) times daily.    . vitamin B-12 (CYANOCOBALAMIN) 1000 MCG tablet Take 1 tablet (1,000 mcg total) by mouth daily.     No current facility-administered medications on file prior to visit.    Allergies:   Allergies  Allergen Reactions  . Food Itching and Swelling    Melons : Reaction to Melons (lips and eyes swelled)  . Penicillins Hives, Itching and Other (See Comments)    Hair fell out Did it involve swelling of the face/tongue/throat, SOB, or low BP? No Did it involve sudden or severe rash/hives, skin peeling, or any reaction on the inside of your mouth or nose? Yes Did you need to seek medical attention at a hospital or doctor's office? In hospital reaction When did it last happen?44 yrs old If all above answers are "NO", may proceed with cephalosporin use.     Physical Exam  Vitals:   06/09/19 1343  BP: (!) 136/97  Pulse: 71  Temp: (!) 97.1 F (36.2 C)  Weight: 77.6 kg  Height: 5\' 3"  (1.6 m)   Body mass index is 30.29 kg/m. No exam data present  General: well developed, well nourished,  pleasant middle-aged African-American female, seated, in no evident distress Head: head normocephalic and atraumatic.   Neck: supple with no carotid or supraclavicular bruits Cardiovascular: regular rate and rhythm, no  murmurs Musculoskeletal: no deformity Skin:  no rash/petichiae Vascular:  Normal pulses all extremities   Neurologic Exam Mental Status: Awake and fully alert.   Normal speech and language.  Oriented to place and time. Recent and remote memory intact during visit. Attention span, concentration and fund of knowledge appropriate. Mood and affect appropriate.  Cranial Nerves: Fundoscopic exam not done. Pupils equal, briskly reactive to light. Extraocular movements full without nystagmus. Visual fields full to confrontation. Hearing intact. Facial sensation intact.  Mild left lower facial weakness.  Motor: Normal bulk and tone. Mild diminished fine finger movements on the left.  Orbits right over left upper extremity.  Mild weakness of left hip flexors and ankle dorsiflexors.   Sensory.:  Hypersensitivity left upper and lower extremity with subjective increased numbness/tingling and weird sensation with light touch Coordination: Rapid alternating movements normal in all extremities except slightly diminished left hand. Finger-to-nose and heel-to-shin performed accurately on right side with mild ataxia on left side.  Mildly orbits left arm greater than. Gait and Station: Arises from chair without difficulty. Stance is normal. Gait demonstrates  slight dragging of the left foot but can ambulate independently without assistance.   Reflexes: 1+ and symmetric. Toes downgoing.     NIHSS 0 Modified Rankin  1    Diagnostic Data (Labs, Imaging, Testing)  Ct Angio Head W Or Wo Contrast Ct Angio Neck W Or Wo Contrast Ct Cerebral Perfusion W Contrast 12/27/2018 IMPRESSION:  CTA neck:  1. 2 mm hypodense focus within the posterior aspect of the right carotid bulb suspicious for adherent thrombus. There may be an underlying carotid web at this site.  2. Common and internal carotid arteries widely patent within the neck without significant stenosis.  3. Bilateral vertebral arteries patent within the neck  without significant stenosis.  CTA head:  No intracranial large vessel occlusion or proximal high-grade arterial stenosis.  CT perfusion head:  No core infarct is identified. No critically hypoperfused parenchyma is identified utilizing a Tmax>6 seconds threshold. No mismatch reported.   Ct Head Wo Contrast 12/27/2018 IMPRESSION:  1. Stable CT appearance of the brain from earlier today. No acute intracranial hemorrhage or evolving infarct identified following IV tPA.  2. Subtle right hemisphere encephalomalacia as the sequelae of the scattered small 2019 infarcts.   Mr Angio Head Wo Contrast 12/27/2018 IMPRESSION:  Normal intracranial MRA.   Mr Brain Wo Contrast 12/27/2018 IMPRESSION:  1. No acute infarct identified.  2. No acute intracranial hemorrhage or other acute intracranial abnormality.  3. Small areas of encephalomalacia and hemosiderin related to multifocal right MCA territory infarcts which occurred in 2019.   Ct Head Code Stroke Wo Contrast 12/27/2018 IMPRESSION:  A few subtle sites of cortical hypodensity within the right MCA territory appear to correspond with previous infarcts. No definite acute infarct identified. ASPECTS 10.    Transthoracic Echocardiogram  12/28/2018 IMPRESSIONS 1. Left ventricular ejection fraction, by visual estimation, is 60 to 65%. The left ventricle has normal function. There is no left ventricular hypertrophy. 2. Global right ventricle has normal systolic function.The right ventricular size is normal. No increase in right ventricular wall thickness. 3. Left atrial size was normal. 4. Right atrial size was normal. 5. The mitral valve is normal in structure. Trace mitral valve regurgitation. No evidence of mitral stenosis. 6. The tricuspid valve is normal in structure. Tricuspid valve regurgitation is trivial. 7. The aortic valve is normal in structure. Aortic valve regurgitation is not visualized. No evidence of aortic valve  sclerosis or stenosis. 8. The pulmonic valve was normal in structure. Pulmonic valve regurgitation is not visualized. 9. Mildly elevated pulmonary artery systolic pressure. 10. The inferior vena cava is normal in size with greater than 50% respiratory variability, suggesting right atrial pressure of 3 mmHg.   ECG - SR rate 82 BPM. (See cardiology reading for complete details)   EEG -normal    ASSESSMENT: Mallory Robinson is a 44 y.o. 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  Was initiated on warfarin with repeat imaging 02/11/2018 without residual dissection therefore were turned discontinued and placed on aspirin.  Vascular risk factors include HTN, HLD, prior stroke, EtOH use and migraines.     PLAN:  I had a long discussion with the patient with regarding her recurrent right carotid thrombus and recurrent strokes and recommend she continue on aspirin for now.  Check repeat CT angiogram of the neck to look for resolution of the thrombus.  If thrombus is increased in size may need to be more aggressive and switch aspirin to stronger anticoagulant.  Continue aggressive risk factor modification with strict control of hypertension with blood pressure goal below 130/90, lipids with LDL cholesterol goal below 70 mg percent and diabetes with hemoglobin A1c goal below 6.5%.  She was encouraged to eat a healthy diet with lots of fruits, vegetables, cereals and whole grains and to exercise regularly and lose weight.  She will return for follow-up in the future in 6 months with my nurse practitioner Janett Billow or call earlier if necessary.d Greater than 50% of time during this 25 minute visit was spent on counseling, explanation of diagnosis of prior stroke with  carotid thrombus, reviewing risk factor management of visual aura migraines and HTN, planning of further management along with potential future management, and discussion with patient and family answering all questions.    Antony Contras, MD  [facility] [address] [place], [place] [address]  Phone [phone] Fax [phone] Note: This document was prepared with digital dictation and possible smart phrase technology. Any transcriptional errors that result from this process are unintentional.

## 2019-06-09 NOTE — Telephone Encounter (Signed)
Medicaid patient is scheduled at GI for 06/24/19 they will obtain the auth.

## 2019-06-19 ENCOUNTER — Other Ambulatory Visit: Payer: Self-pay | Admitting: Adult Health

## 2019-06-22 ENCOUNTER — Inpatient Hospital Stay: Payer: Medicaid Other | Attending: Hematology and Oncology | Admitting: Hematology and Oncology

## 2019-06-22 ENCOUNTER — Ambulatory Visit: Payer: Medicaid Other | Attending: Internal Medicine

## 2019-06-22 ENCOUNTER — Inpatient Hospital Stay: Payer: Medicaid Other

## 2019-06-22 ENCOUNTER — Other Ambulatory Visit: Payer: Self-pay

## 2019-06-22 ENCOUNTER — Encounter: Payer: Self-pay | Admitting: Hematology and Oncology

## 2019-06-22 ENCOUNTER — Other Ambulatory Visit: Payer: Self-pay | Admitting: Hematology and Oncology

## 2019-06-22 VITALS — BP 151/97 | Resp 20 | Ht 63.0 in | Wt 175.3 lb

## 2019-06-22 DIAGNOSIS — D500 Iron deficiency anemia secondary to blood loss (chronic): Secondary | ICD-10-CM

## 2019-06-22 DIAGNOSIS — D473 Essential (hemorrhagic) thrombocythemia: Secondary | ICD-10-CM | POA: Insufficient documentation

## 2019-06-22 DIAGNOSIS — K5900 Constipation, unspecified: Secondary | ICD-10-CM | POA: Insufficient documentation

## 2019-06-22 DIAGNOSIS — Z23 Encounter for immunization: Secondary | ICD-10-CM

## 2019-06-22 DIAGNOSIS — N920 Excessive and frequent menstruation with regular cycle: Secondary | ICD-10-CM | POA: Insufficient documentation

## 2019-06-22 LAB — CBC WITH DIFFERENTIAL (CANCER CENTER ONLY)
Abs Immature Granulocytes: 0.04 10*3/uL (ref 0.00–0.07)
Basophils Absolute: 0 10*3/uL (ref 0.0–0.1)
Basophils Relative: 1 %
Eosinophils Absolute: 0.1 10*3/uL (ref 0.0–0.5)
Eosinophils Relative: 1 %
HCT: 41.3 % (ref 36.0–46.0)
Hemoglobin: 12.6 g/dL (ref 12.0–15.0)
Immature Granulocytes: 1 %
Lymphocytes Relative: 23 %
Lymphs Abs: 1.6 10*3/uL (ref 0.7–4.0)
MCH: 24.7 pg — ABNORMAL LOW (ref 26.0–34.0)
MCHC: 30.5 g/dL (ref 30.0–36.0)
MCV: 81 fL (ref 80.0–100.0)
Monocytes Absolute: 0.5 10*3/uL (ref 0.1–1.0)
Monocytes Relative: 8 %
Neutro Abs: 4.4 10*3/uL (ref 1.7–7.7)
Neutrophils Relative %: 66 %
Platelet Count: 450 10*3/uL — ABNORMAL HIGH (ref 150–400)
RBC: 5.1 MIL/uL (ref 3.87–5.11)
RDW: 17 % — ABNORMAL HIGH (ref 11.5–15.5)
WBC Count: 6.6 10*3/uL (ref 4.0–10.5)
nRBC: 0 % (ref 0.0–0.2)

## 2019-06-22 LAB — SAVE SMEAR(SSMR), FOR PROVIDER SLIDE REVIEW

## 2019-06-22 LAB — IRON AND TIBC
Iron: 20 ug/dL — ABNORMAL LOW (ref 41–142)
Saturation Ratios: 5 % — ABNORMAL LOW (ref 21–57)
TIBC: 421 ug/dL (ref 236–444)
UIBC: 401 ug/dL — ABNORMAL HIGH (ref 120–384)

## 2019-06-22 LAB — RETIC PANEL
Immature Retic Fract: 15.8 % (ref 2.3–15.9)
RBC.: 5.08 MIL/uL (ref 3.87–5.11)
Retic Count, Absolute: 49.8 10*3/uL (ref 19.0–186.0)
Retic Ct Pct: 1 % (ref 0.4–3.1)
Reticulocyte Hemoglobin: 24.2 pg — ABNORMAL LOW (ref >27.9)

## 2019-06-22 LAB — CMP (CANCER CENTER ONLY)
ALT: 30 U/L (ref 0–44)
AST: 23 U/L (ref 15–41)
Albumin: 4.3 g/dL (ref 3.5–5.0)
Alkaline Phosphatase: 88 U/L (ref 38–126)
Anion gap: 12 (ref 5–15)
BUN: 10 mg/dL (ref 6–20)
CO2: 24 mmol/L (ref 22–32)
Calcium: 9.9 mg/dL (ref 8.9–10.3)
Chloride: 105 mmol/L (ref 98–111)
Creatinine: 1.02 mg/dL — ABNORMAL HIGH (ref 0.44–1.00)
GFR, Est AFR Am: >60 mL/min (ref >60)
GFR, Estimated: >60 mL/min (ref >60)
Glucose, Bld: 89 mg/dL (ref 70–99)
Potassium: 4.4 mmol/L (ref 3.5–5.1)
Sodium: 141 mmol/L (ref 135–145)
Total Bilirubin: 0.3 mg/dL (ref 0.3–1.2)
Total Protein: 8.1 g/dL (ref 6.5–8.1)

## 2019-06-22 LAB — FERRITIN: Ferritin: 22 ng/mL (ref 11–307)

## 2019-06-22 NOTE — Progress Notes (Signed)
[place] [phone]   [phone]  PROGRESS NOTE  Patient Care Team: Nolene Ebbs, MD as PCP - General (Internal Medicine)  Hematological/Oncological History # Iron Deficiency Anemia 2/2 GYN Blood Loss #Thrombocytosis 1) 03/04/2013: WBC 6.7, Hgb 9.6, MCV 63.5, Plt 215 2) 11/10/2017: WBC 16.8, hgb 6.0, Plt 1301, MCV 61.2 3) 04/10/2019: Hematology consulted while patient hospitalized for CVA. WBC 10.1, Hgb 8.0, MCV 63.7, Plt 956. Iron studies showed Iron 16, TIBC 550, Sat 3%, Ferritin 2. 4) 04/11/19: received 1 dose of IV feraheme 510mg   5) 04/22/2019: return clinic visit. WBC 7.0, Hgb 11.6, MCV 74.5, Plt 235. Iron 265, TIBC 425, Sat 62%, Ferritin 139.  6) 06/22/2019: WBC 6.6, Hgb 12.6, Plt 450. Iron studies pending  Interval History:  Mallory Robinson 44 y.o. female with medical history significant for iron deficiency anemia who presents for a follow up visit. The was last seen on 04/22/2019 at which time her Hgb had improved to 11.6. In the interim since her last visit she has been well with no hospitalizations or ED visits.   On exam today Mallory Robinson notes that her energy levels have improved markedly since last time we spoke 2 months ago.  She notes that time she was still having some grogginess, but overall improvement.  She notes that her energy level is doing a lot better and that she feels quite well in the interim since her last visit.  She notes that she has been drinking tea which is helped with the constipation she has been experiencing with iron therapy.  She notes she is been taking the iron once daily and occasionally misses a dose here and there, but has not missed that many.    She notes that her menstrual cycles have been lighter in the interim and then on her worst day she sleeps through about 5 pads per day.  She notes that her cravings for ice is completely gone.  Other symptoms she notes it is indigestion which is helped when she drinks the glass  of orange juice with the iron therapy.  She notes that there has been no other major changes in her health.  She notes that she is not having particular dark stools or tarry stools and has had no other overt sources of bleeding.  A full 10 point ROS is listed below.  MEDICAL HISTORY:  Past Medical History:  Diagnosis Date  . Bipolar 1 disorder ([place])   . Complication of anesthesia   . Depression   . Hypertension   . Low vitamin B12 level 04/11/2019  . PONV (postoperative nausea and vomiting)   . Stroke [facility])     SURGICAL HISTORY: Past Surgical History:  Procedure Laterality Date  . CESAREAN SECTION     X2  . HERNIA REPAIR    . TEE WITHOUT CARDIOVERSION N/A 11/13/2017   Procedure: TRANSESOPHAGEAL ECHOCARDIOGRAM (TEE) bubble study;  Surgeon: Skeet Latch, MD;  Location: [place];  Service: Cardiovascular;  Laterality: N/A;    ALLERGIES:  is allergic to food and penicillins.  MEDICATIONS:  Current Outpatient Medications  Medication Sig Dispense Refill  . aspirin EC 81 MG tablet Take 81 mg by mouth daily.    . ferrous sulfate 325 (65 FE) MG tablet Take 1 tablet (325 mg total) by mouth 2 (two) times daily with a meal. 60 tablet 0  . folic acid (FOLVITE) 1 MG tablet Take 2 tablets (2 mg total) by mouth daily. (Patient taking differently: Take 1 mg  by mouth daily. ) 30 tablet 0  . propranolol (INDERAL) 20 MG tablet TAKE 1 TABLET BY MOUTH TWICE DAILY 180 tablet 1  . pyridOXINE (B-6) 50 MG tablet Take 1 tablet (50 mg total) by mouth daily.    Marland Kitchen senna-docusate (SENOKOT-S) 8.6-50 MG tablet Take 1 tablet by mouth 2 (two) times daily.    . vitamin B-12 (CYANOCOBALAMIN) 1000 MCG tablet Take 1 tablet (1,000 mcg total) by mouth daily.     No current facility-administered medications for this visit.    REVIEW OF SYSTEMS:   Constitutional: ( - ) fevers, ( - )  chills , ( - ) night sweats Eyes: ( - ) blurriness of vision, ( - ) double vision, ( - ) watery eyes Ears, nose, mouth, throat,  and face: ( - ) mucositis, ( - ) sore throat Respiratory: ( - ) cough, ( - ) dyspnea, ( - ) wheezes Cardiovascular: ( - ) palpitation, ( - ) chest discomfort, ( - ) lower extremity swelling Gastrointestinal:  ( - ) nausea, ( - ) heartburn, ( - ) change in bowel habits Skin: ( - ) abnormal skin rashes Lymphatics: ( - ) new lymphadenopathy, ( - ) easy bruising Neurological: ( - ) numbness, ( - ) tingling, ( - ) new weaknesses Behavioral/Psych: ( - ) mood change, ( - ) new changes  All other systems were reviewed with the patient and are negative.  PHYSICAL EXAMINATION: ECOG PERFORMANCE STATUS: 1 - Symptomatic but completely ambulatory  Vitals:   06/22/19 1013  BP: (!) 151/97  Resp: 20   Filed Weights   06/22/19 1013  Weight: 175 lb 4.8 oz (79.5 kg)    GENERAL: well appearing middle aged [place] American female. Alert, no distress and comfortable SKIN: skin color, texture, turgor are normal, no rashes or significant lesions EYES: conjunctiva are pale and non-injected, sclera clear NECK: supple, non-tender. Increased submandibular soft tissue, possible enlarged salivary gland vs ectopic thymus  LUNGS: clear to auscultation and percussion with normal breathing effort HEART: regular rate & rhythm and no murmurs and no lower extremity edema Musculoskeletal: no cyanosis of digits and no clubbing  PSYCH: alert & oriented x 3, fluent speech NEURO: no focal motor/sensory deficits  LABORATORY DATA:  I have reviewed the data as listed CBC Latest Ref Rng & Units 06/22/2019 04/22/2019 04/12/2019  WBC 4.0 - 10.5 K/uL 6.6 7.0 9.9  Hemoglobin 12.0 - 15.0 g/dL 12.6 11.6(L) 7.8(L)  Hematocrit 36.0 - 46.0 % 41.3 40.7 27.6(L)  Platelets 150 - 400 K/uL 450(H) 235 805(H)    CMP Latest Ref Rng & Units 06/22/2019 04/22/2019 04/12/2019  Glucose 70 - 99 mg/dL 89 97 94  BUN 6 - 20 mg/dL 10 10 8   Creatinine 0.44 - 1.00 mg/dL 1.02(H) 1.03(H) 0.87  Sodium 135 - 145 mmol/L 141 138 137  Potassium 3.5 - 5.1 mmol/L  4.4 4.0 3.6  Chloride 98 - 111 mmol/L 105 105 105  CO2 22 - 32 mmol/L 24 24 22   Calcium 8.9 - 10.3 mg/dL 9.9 10.0 9.0  Total Protein 6.5 - 8.1 g/dL 8.1 8.1 -  Total Bilirubin 0.3 - 1.2 mg/dL 0.3 0.7 -  Alkaline Phos 38 - 126 U/L 88 70 -  AST 15 - 41 U/L 23 14(L) -  ALT 0 - 44 U/L 30 12 -    RADIOGRAPHIC STUDIES:  No results found.  ASSESSMENT & PLAN Mallory Robinson 44 y.o. female with medical history significant for iron deficiency anemia who  presents for a follow up visit.  In the interim since her last visit Mallory Robinson has been taking daily iron with OJ which has relieved constipation.   Unfortunately on exam today the reticulocyte hemoglobin appears to be declining and the platelet count rising.  Her hemoglobin has returned to normal levels, however she is having other signs of continued iron deficiency.  It is possible that she has utilized virtually all of the initial IV dose of IV Feraheme and the p.o. iron is not adequately keeping up with her iron needs.  It may be reasonable to administer IV Feraheme 510mg  x2 doses in order to bolster her iron levels.  Additionally her GYN blood loss appears to be lighter than prior.   In the event that her iron levels continue to decline I think we would need to consider evaluation by gastroenterology for possible GI sources of blood loss as her GYN losses would not explain these findings.  We will plan to see the patient back in approximately 3 months time to determine if p.o. therapy has been adequate or if IV therapy is required.  # Iron Deficiency Anemia Secondary to GYN Blood Loss # Thrombocytosis, Secondary to Iron Deficiency --today will recheck CBC, CMP, retic panel, and iron studies --continue PO iron 325mg  daily. Recommend she take this with a source of vitamin C (orange juice) to improve absorption --Hgb has improved, though thrombocytosis is increasing. Will need to assess iron levels today to assure she is adequately  repleted --continued outpatient GYN evaluation for management of heavy menstrual cycles --RTC in 3 months or sooner if more IV iron is required  #Constipation, improved --patient currently self treating with tea.  --recommend continued senna-docusate, with the addition of magnesium citrate to help move her bowels PRN -- additionally can consider miralax to help move bowels --continue to monitor   No orders of the defined types were placed in this encounter.   All questions were answered. The patient knows to call the clinic with any problems, questions or concerns.  A total of more than 20 minutes were spent on this encounter and over half of that time was spent on counseling and coordination of care as outlined above.   Ledell Peoples, MD Department of Hematology/Oncology [place] at [facility] Phone: [phone] Pager: [phone] Email: [email or web address]  06/22/2019 10:53 AM

## 2019-06-22 NOTE — Progress Notes (Signed)
   Covid-19 Vaccination Clinic  Name:  DEZRAE GILLOCK    MRN: [id number] DOB: Oct 09, 1975  06/22/2019  Ms. Janosky was observed post Covid-19 immunization for 15 minutes without incident. She was provided with Vaccine Information Sheet and instruction to access the V-Safe system.   Ms. Viselli was instructed to call 911 with any severe reactions post vaccine: Marland Kitchen Difficulty breathing  . Swelling of face and throat  . A fast heartbeat  . A bad rash all over body  . Dizziness and weakness   Immunizations Administered    Name Date Dose VIS Date Route   Pfizer COVID-19 Vaccine 06/22/2019 11:22 AM 0.3 mL 04/15/2018 Intramuscular   Manufacturer: [place]   Lot: [id number]   [place]: [id number]

## 2019-06-23 ENCOUNTER — Telehealth: Payer: Self-pay | Admitting: Hematology and Oncology

## 2019-06-23 NOTE — Telephone Encounter (Signed)
Scheduled per los. Called and left msg. Mailed printout  °

## 2019-06-24 ENCOUNTER — Ambulatory Visit
Admission: RE | Admit: 2019-06-24 | Discharge: 2019-06-24 | Disposition: A | Discharge status: Home / Self Care | Payer: Medicaid Other | Source: Ambulatory Visit | Attending: Neurology | Admitting: Neurology

## 2019-06-24 ENCOUNTER — Telehealth: Payer: Self-pay | Admitting: *Deleted

## 2019-06-24 ENCOUNTER — Other Ambulatory Visit: Payer: Self-pay

## 2019-06-24 DIAGNOSIS — I639 Cerebral infarction, unspecified: Secondary | ICD-10-CM | POA: Diagnosis not present

## 2019-06-24 DIAGNOSIS — I6521 Occlusion and stenosis of right carotid artery: Secondary | ICD-10-CM

## 2019-06-24 IMAGING — CT CT ANGIO NECK
1 of 3 series · 12 of 32 positions shown, 18 images · IV contrast (APPLIED)
Comparison: [DATE]

CLINICAL DATA: Stroke follow-up. Previous right carotid bulb web or
plaque/thrombus.

EXAM:
CT ANGIOGRAPHY NECK
TECHNIQUE: Multidetector CT imaging of the neck was performed using the
standard protocol during bolus administration of intravenous
contrast. Multiplanar CT image reconstructions and MIPs were
obtained to evaluate the vascular anatomy. Carotid stenosis
measurements (when applicable) are obtained utilizing NASCET
criteria, using the distal internal carotid diameter as the
denominator.
CONTRAST:  75mL [V4] IOPAMIDOL ([V4]) INJECTION 76%

[Series 8: axial · axial · 0.35mm/px · z∈[-324,-135]mm · 12 of 225 slices shown, 18 images]
[im 18/225  soft-tissue]
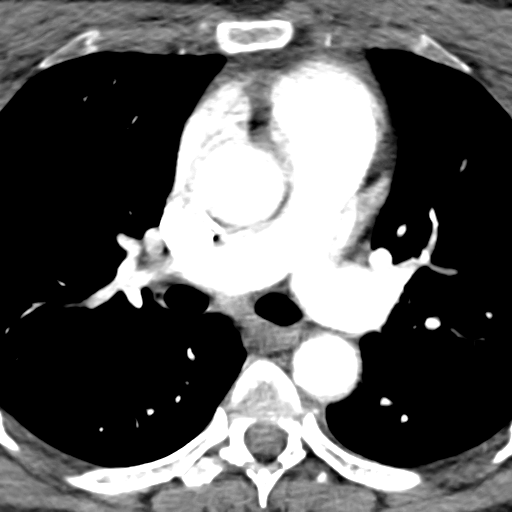
[im 18/225  bone]
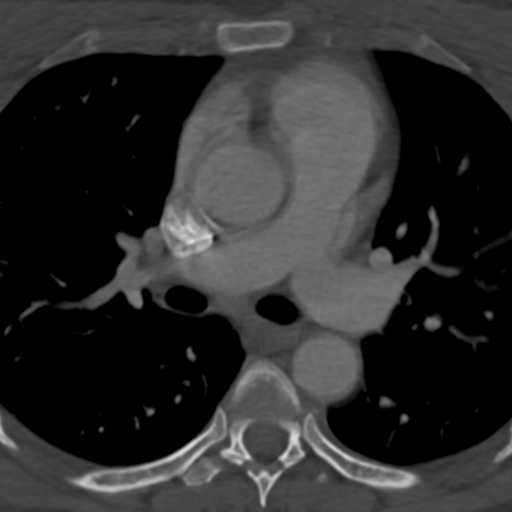
[im 35/225  soft-tissue]
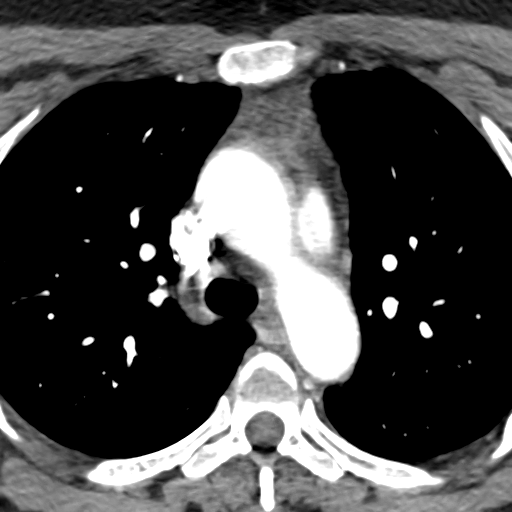
[im 52/225  soft-tissue]
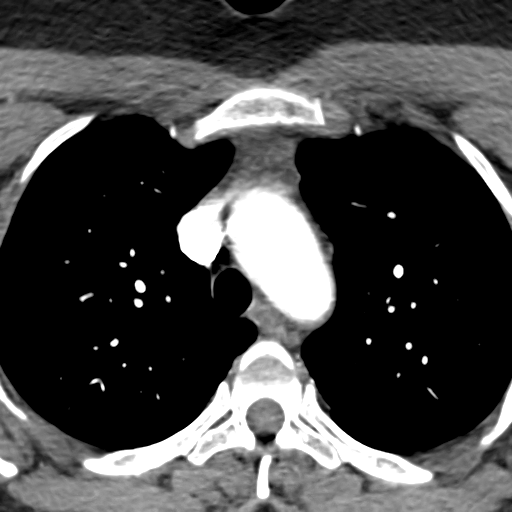
[im 69/225  soft-tissue]
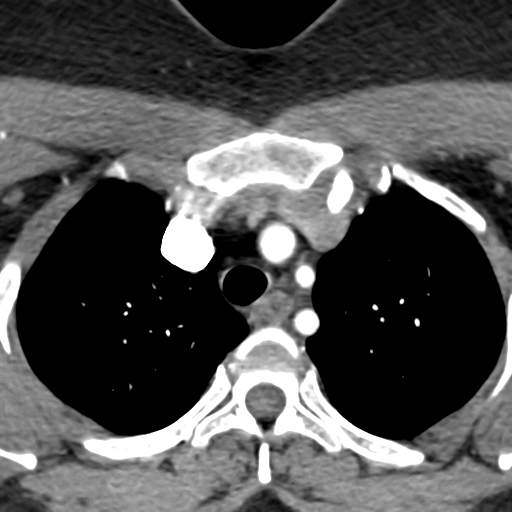
[im 87/225  soft-tissue]
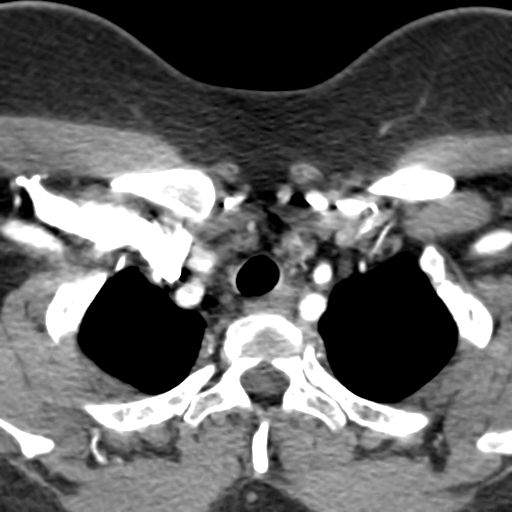
[im 104/225  soft-tissue]
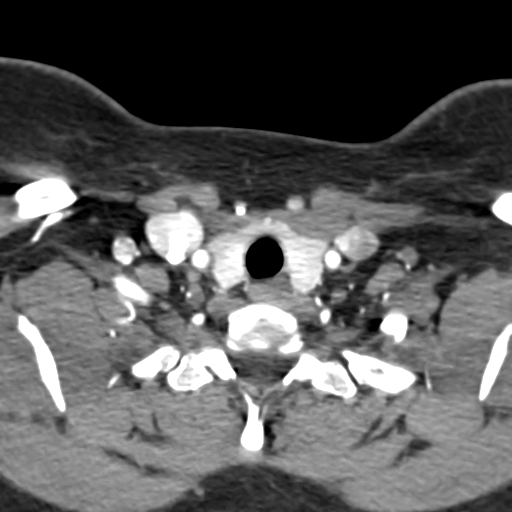
[im 121/225  soft-tissue]
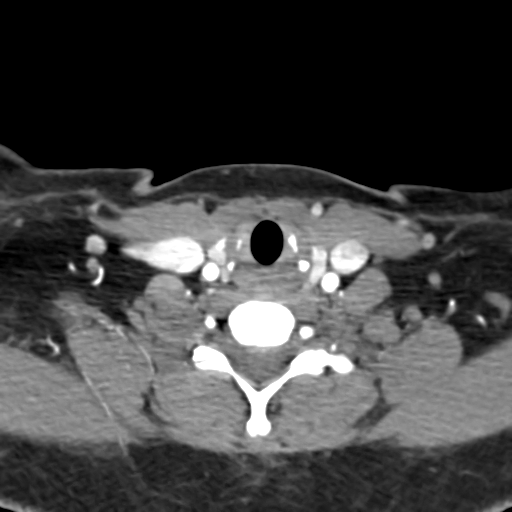
[im 138/225  soft-tissue]
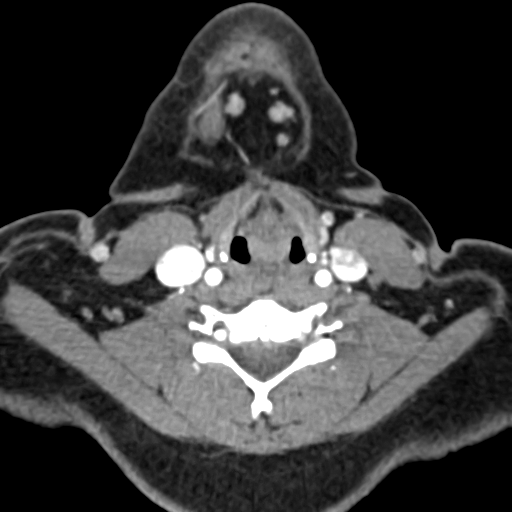
[im 156/225  soft-tissue]
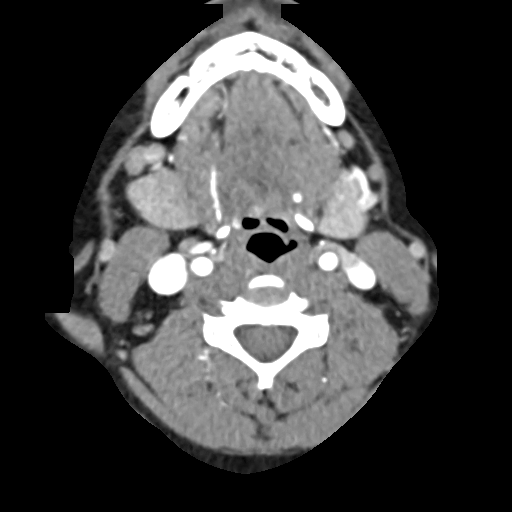
[im 156/225  lung]
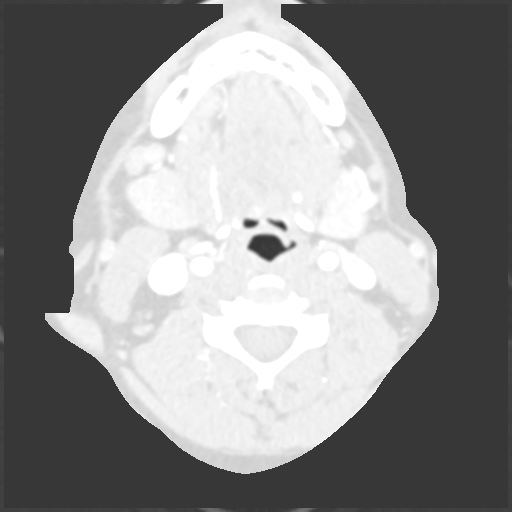
[im 156/225  bone]
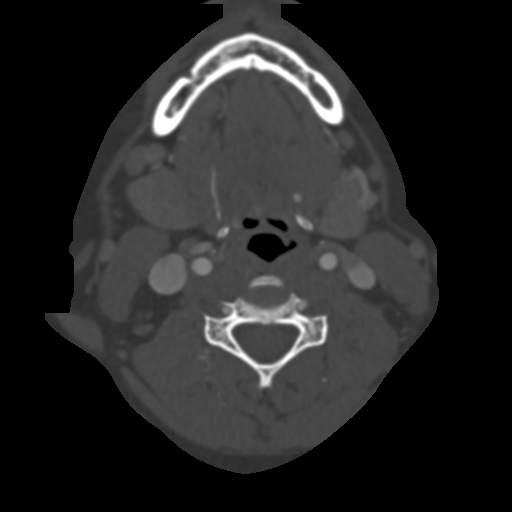
[im 173/225  soft-tissue]
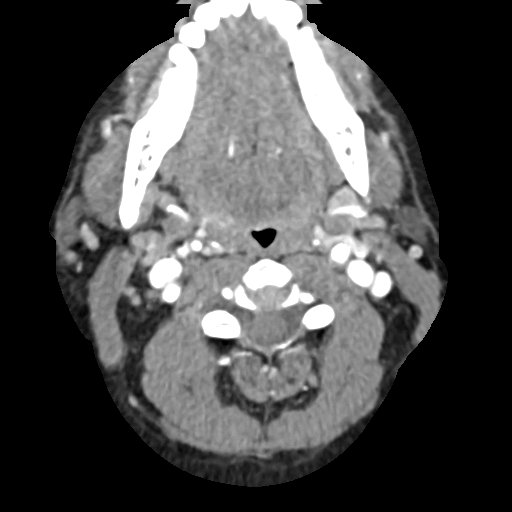
[im 173/225  lung]
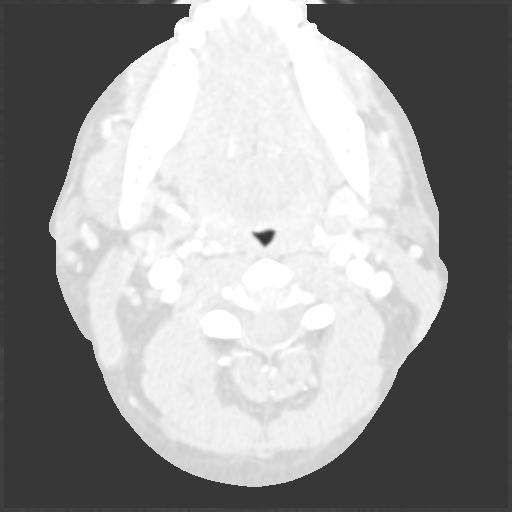
[im 190/225  soft-tissue]
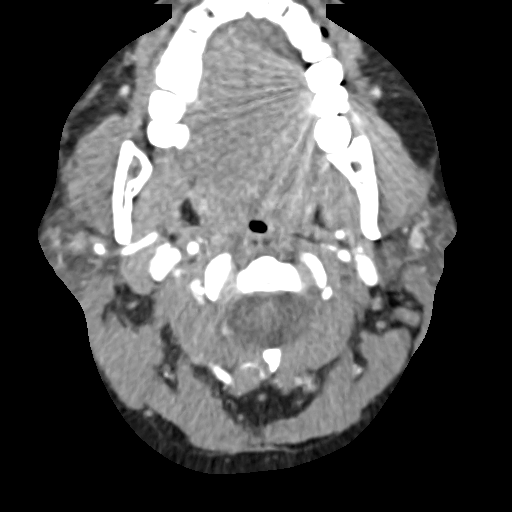
[im 190/225  lung]
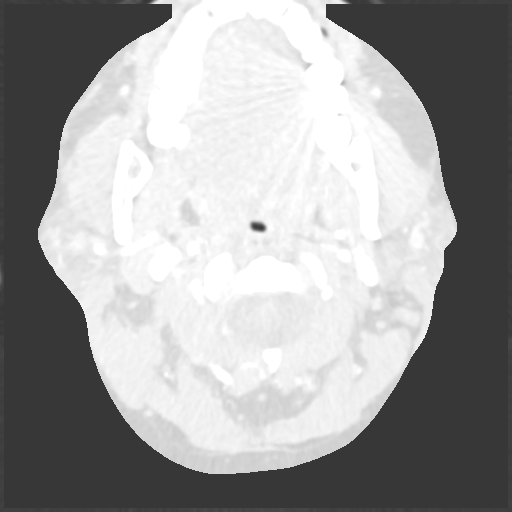
[im 207/225  soft-tissue]
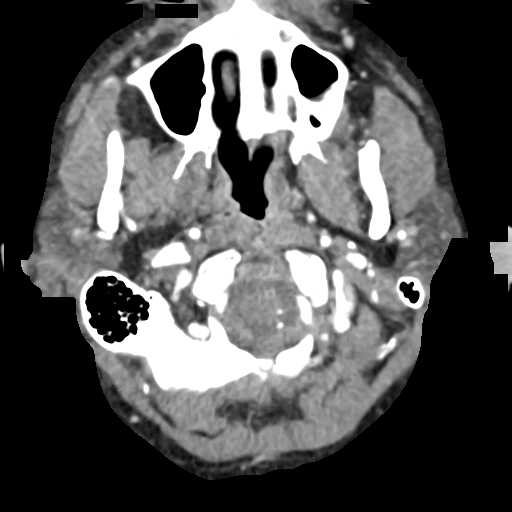
[im 207/225  lung]
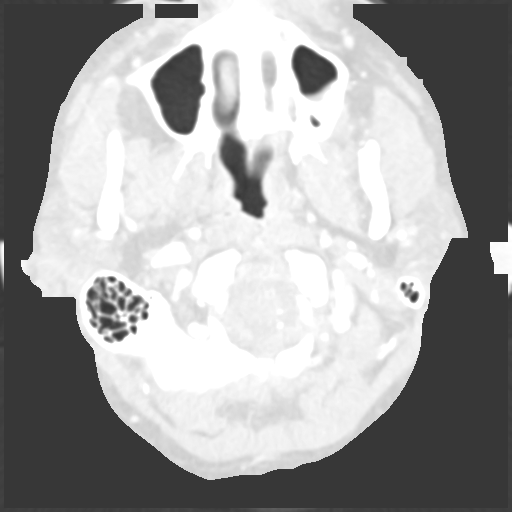

[12 of 32 positions shown; findings below may reference images not displayed]

FINDINGS: Aortic arch: Standard 3 vessel aortic arch with widely patent arch
vessel origins.

Right carotid system: Patent without evidence of stenosis,
dissection, or significant atherosclerosis. A 3 mm filling defect
along the posterior wall of the carotid bulb on the prior study has
resolved, and no underlying web is evident.

Left carotid system: Patent without evidence of stenosis,
dissection, or significant atherosclerosis.

Vertebral arteries: Patent without evidence stenosis, dissection, or
significant atherosclerosis. Minimally dominant left vertebral
artery.

Skeleton: No acute osseous abnormality or suspicious osseous lesion.

Other neck: No evidence of cervical lymphadenopathy or mass.

Upper chest: Clear lung apices.
IMPRESSION: Widely patent cervical carotid and vertebral arteries. Interval
resolution of a small focus of soft plaque or thrombus in the right
carotid bulb without evidence of an underlying web.

## 2019-06-24 MED ORDER — IOPAMIDOL (ISOVUE-370) INJECTION 76%
75.0000 mL | Freq: Once | INTRAVENOUS | Status: AC | PRN
  Administered 2019-06-24: 10:00:00 75 mL via INTRAVENOUS

## 2019-06-24 NOTE — Telephone Encounter (Signed)
TCT patient regarding results from lab work done on 06/22/19.  Spoke with her and informed her that her iron levels had dropped from previous levels.  Pt states she is taking her po iron daily. She has started taking it with orange juice this week-she had not been doing that before.  Advised that we would see her back in 3 months but that she can call us is she becomes symptomatic with ice cravings, shortness of breath or fatigue. Pt voiced understanding. No questions or concerns.

## 2019-06-24 NOTE — Telephone Encounter (Signed)
-----   Message from Orson Slick, MD sent at 06/23/2019  4:58 PM EDT ----- Please let Mallory Robinson know that her iron levels have dropped from her last visit. Please assure she is taking PO iron as prescribed. Fortunately she is not anemic, her Hgb is within normal limits. We will see her back in 3 months to assure the PO iron is repleting her iron stores.   If she were to become symptomatic with 1) ice cravings 2) shortness of breath, or 3) fatigue please have her call for an earlier visit. If she becomes anemic again we can give IV iron.  Mallory Robinson  ----- Message ----- From: Mallory Robinson, Lab In [place] Sent: 06/22/2019  10:15 AM EDT To: Orson Slick, MD

## 2019-06-26 NOTE — Progress Notes (Signed)
Kindly inform the patient that follow-up CT angiogram of the neck shows resolution of the small right carotid clot versus web which is now gone and is no longer visible.  Nothing to worry about, continue aspirin

## 2019-07-01 ENCOUNTER — Telehealth: Payer: Self-pay

## 2019-07-01 NOTE — Telephone Encounter (Signed)
I called pt about CT angio neck results. I stated per Dr.Sethi the angiogram of the neck shows resolution of the small right carotid clot versus web which is now gone and is no longer visible.  Nothing to worry about, continue aspirin. Pt verbalized understanding.

## 2019-07-01 NOTE — Telephone Encounter (Signed)
-----   Message from Garvin Fila, MD sent at 06/26/2019  2:23 PM EDT ----- Mitchell Heir inform the patient that follow-up CT angiogram of the neck shows resolution of the small right carotid clot versus web which is now gone and is no longer visible.  Nothing to worry about, continue aspirin

## 2019-07-01 NOTE — Telephone Encounter (Signed)
Left vm for patient to call back about CT angio neck results.

## 2019-09-20 ENCOUNTER — Other Ambulatory Visit: Payer: Self-pay | Admitting: Hematology and Oncology

## 2019-09-20 DIAGNOSIS — D500 Iron deficiency anemia secondary to blood loss (chronic): Secondary | ICD-10-CM

## 2019-09-20 NOTE — Progress Notes (Signed)
[place] Telephone:([phone]) [phone]   [phone]) [phone]  PROGRESS NOTE  Patient Care Team: Nolene Ebbs, MD as PCP - General (Internal Medicine)  Hematological/Oncological History # Iron Deficiency Anemia 2/2 GYN Blood Loss #Thrombocytosis 1) 03/04/2013: WBC 6.7, Hgb 9.6, MCV 63.5, Plt 215 2) 11/10/2017: WBC 16.8, hgb 6.0, Plt 1301, MCV 61.2 3) 04/10/2019: Hematology consulted while patient hospitalized for CVA. WBC 10.1, Hgb 8.0, MCV 63.7, Plt 956. Iron studies showed Iron 16, TIBC 550, Sat 3%, Ferritin 2. 4) 04/11/19: received 1 dose of IV feraheme 510mg   5) 04/22/2019: return clinic visit. WBC 7.0, Hgb 11.6, MCV 74.5, Plt 235. Iron 265, TIBC 425, Sat 62%, Ferritin 139.  6) 06/22/2019: WBC 6.6, Hgb 12.6, Plt 450. Iron 20, TIBC 421, Iron sat 5%, Ferritin 22.  7) 09/21/2019: WBC 6.6, Hgb 13.8, MCV 84.6, Plt 381  Interval History:  Mallory Robinson 44 y.o. female with medical history significant for iron deficiency anemia who presents for a follow up visit. The was last seen on 06/22/2019 at which time her Hgb had improved to 12.6, but iron levels remained low. In the interim since her last visit she has been well with no hospitalizations or ED visits.    On exam today [name] notes she has been doing better since she began administering the p.o. iron with orange juice.  She notes that she does still continue to have mild constipation, however the tea that she has been drinking has been helping with that.  She has been also having some issues with indigestion.  In terms of energy she has not noticed a difference, but she reports that her family says she has much more energy and she does not feel so much like she is "being pushed onto the floor".  She does that she has made no dietary changes in order to increase p.o. iron, though she notes that she has been gaining weight and has a desire to curb this.  On further discussion she notes that her menstrual bleeding has been less severe,  but more sporadic.  She notes that none of these subsequent cycles have been too heavy.  She still continues to have some light auras that she thinks are a residual effect from the stroke she had while she was in house.  Otherwise she reports that she has been healthy.  She denies having any fevers, chills, sweats, nausea vomiting or diarrhea.  She is agreeable to continuing p.o. iron therapy as long as she is menstruating.  A full 10 point ROS is listed below.  MEDICAL HISTORY:  Past Medical History:  Diagnosis Date  . Bipolar 1 disorder ([place])   . Complication of anesthesia   . Depression   . Hypertension   . Low vitamin B12 level 04/11/2019  . PONV (postoperative nausea and vomiting)   . Stroke [facility])     SURGICAL HISTORY: Past Surgical History:  Procedure Laterality Date  . CESAREAN SECTION     X2  . HERNIA REPAIR    . TEE WITHOUT CARDIOVERSION N/A 11/13/2017   Procedure: TRANSESOPHAGEAL ECHOCARDIOGRAM (TEE) bubble study;  Surgeon: Skeet Latch, MD;  Location: [place];  Service: Cardiovascular;  Laterality: N/A;    ALLERGIES:  is allergic to food and penicillins.  MEDICATIONS:  Current Outpatient Medications  Medication Sig Dispense Refill  . aspirin EC 81 MG tablet Take 81 mg by mouth daily.    . ferrous sulfate 325 (65 FE) MG tablet Take 1 tablet (325 mg total) by  mouth 2 (two) times daily with a meal. 60 tablet 0  . folic acid (FOLVITE) 1 MG tablet Take 2 tablets (2 mg total) by mouth daily. (Patient taking differently: Take 1 mg by mouth daily. ) 30 tablet 0  . propranolol (INDERAL) 20 MG tablet TAKE 1 TABLET BY MOUTH TWICE DAILY 180 tablet 1  . pyridOXINE (B-6) 50 MG tablet Take 1 tablet (50 mg total) by mouth daily.    Marland Kitchen senna-docusate (SENOKOT-S) 8.6-50 MG tablet Take 1 tablet by mouth 2 (two) times daily.    . vitamin B-12 (CYANOCOBALAMIN) 1000 MCG tablet Take 1 tablet (1,000 mcg total) by mouth daily.     No current facility-administered medications for this  visit.    REVIEW OF SYSTEMS:   Constitutional: ( - ) fevers, ( - )  chills , ( - ) night sweats Eyes: ( - ) blurriness of vision, ( - ) double vision, ( - ) watery eyes Ears, nose, mouth, throat, and face: ( - ) mucositis, ( - ) sore throat Respiratory: ( - ) cough, ( - ) dyspnea, ( - ) wheezes Cardiovascular: ( - ) palpitation, ( - ) chest discomfort, ( - ) lower extremity swelling Gastrointestinal:  ( - ) nausea, ( - ) heartburn, ( - ) change in bowel habits Skin: ( - ) abnormal skin rashes Lymphatics: ( - ) new lymphadenopathy, ( - ) easy bruising Neurological: ( - ) numbness, ( - ) tingling, ( - ) new weaknesses Behavioral/Psych: ( - ) mood change, ( - ) new changes  All other systems were reviewed with the patient and are negative.  PHYSICAL EXAMINATION: ECOG PERFORMANCE STATUS: 1 - Symptomatic but completely ambulatory  Vitals:   09/21/19 0828  BP: (!) 146/93  Pulse: 57  Resp: 16  Temp: 97.9 F (36.6 C)   Filed Weights   09/21/19 0828  Weight: 176 lb 1.6 oz (79.9 kg)    GENERAL: well appearing middle aged [place] American female. Alert, no distress and comfortable SKIN: skin color, texture, turgor are normal, no rashes or significant lesions EYES: conjunctiva are pale and non-injected, sclera clear LUNGS: clear to auscultation and percussion with normal breathing effort HEART: regular rate & rhythm and no murmurs and no lower extremity edema Musculoskeletal: no cyanosis of digits and no clubbing  PSYCH: alert & oriented x 3, fluent speech NEURO: no focal motor/sensory deficits  LABORATORY DATA:  I have reviewed the data as listed CBC Latest Ref Rng & Units 09/21/2019 06/22/2019 04/22/2019  WBC 4.0 - 10.5 K/uL 6.6 6.6 7.0  Hemoglobin 12.0 - 15.0 g/dL 13.8 12.6 11.6(L)  Hematocrit 36 - 46 % 43.4 41.3 40.7  Platelets 150 - 400 K/uL 381 450(H) 235    CMP Latest Ref Rng & Units 09/21/2019 06/22/2019 04/22/2019  Glucose 70 - 99 mg/dL 105(H) 89 97  BUN 6 - 20 mg/dL 8 10 10     Creatinine 0.44 - 1.00 mg/dL 0.99 1.02(H) 1.03(H)  Sodium 135 - 145 mmol/L 137 141 138  Potassium 3.5 - 5.1 mmol/L 4.1 4.4 4.0  Chloride 98 - 111 mmol/L 107 105 105  CO2 22 - 32 mmol/L 21(L) 24 24  Calcium 8.9 - 10.3 mg/dL 10.4(H) 9.9 10.0  Total Protein 6.5 - 8.1 g/dL 7.8 8.1 8.1  Total Bilirubin 0.3 - 1.2 mg/dL 0.4 0.3 0.7  Alkaline Phos 38 - 126 U/L 77 88 70  AST 15 - 41 U/L 17 23 14(L)  ALT 0 - 44 U/L 19 30  12    RADIOGRAPHIC STUDIES:  No results found.  ASSESSMENT & PLAN THERESIA PREE 44 y.o. female with medical history significant for iron deficiency anemia who presents for a follow up visit.  In the interim since her last visit Ms. Cloninger has been taking daily iron with OJ which has relieved constipation.   On review the labs today the patient's iron deficiency anemia has corrected and she now has a normal hemoglobin, normal MCV, and her platelet count has returned to normal.  Iron studies are still pending at the time of writing this note.  At this time would recommend that she continue p.o. iron supplementation as long as she is menstruating.  She notes that her periods are becoming more sporadic and that may be a sign that she is beginning to go through menopause.  Overall her findings are most consistent with anemia secondary to GYN blood loss and therefore I do not think GI evaluation is warranted at this time.  We will plan to see her back in approximately 3 months time in order to assure that her iron stores continue to be replete.  If findings at that time are normal we will have her back in 6 months.  # Iron Deficiency Anemia Secondary to GYN Blood Loss # Thrombocytosis, Secondary to Iron Deficiency. Resolved --today will recheck CBC, CMP, retic panel, and iron studies --continue PO iron 325mg  daily. Recommend she take this with a source of vitamin C (orange juice) to improve absorption --at last visit, Hgb has improved, though thrombocytosis is increased. Thrombocytosis  appears resolved today --continued outpatient GYN evaluation for management of heavy menstrual cycles. No clear indication for GI evaluation at this time. --RTC in 3 months or sooner if more IV iron is required  #Constipation, stable --patient currently self treating with tea.  --recommend continued senna-docusate, with the addition of magnesium citrate to help move her bowels PRN -- additionally can consider miralax to help move bowels --continue to monitor   No orders of the defined types were placed in this encounter.   All questions were answered. The patient knows to call the clinic with any problems, questions or concerns.  A total of more than 30 minutes were spent on this encounter and over half of that time was spent on counseling and coordination of care as outlined above.   Ledell Peoples, MD Department of Hematology/Oncology [place] at [facility] Phone: [phone] Pager: [phone] Email: [email or web address]  09/21/2019 9:25 AM

## 2019-09-21 ENCOUNTER — Inpatient Hospital Stay: Payer: BC Managed Care – PPO | Attending: Hematology and Oncology | Admitting: Hematology and Oncology

## 2019-09-21 ENCOUNTER — Inpatient Hospital Stay: Payer: BC Managed Care – PPO

## 2019-09-21 ENCOUNTER — Other Ambulatory Visit: Payer: Self-pay

## 2019-09-21 ENCOUNTER — Encounter: Payer: Self-pay | Admitting: Hematology and Oncology

## 2019-09-21 VITALS — BP 146/93 | HR 57 | Temp 97.9°F | Resp 16 | Ht 63.0 in | Wt 176.1 lb

## 2019-09-21 DIAGNOSIS — K5900 Constipation, unspecified: Secondary | ICD-10-CM | POA: Diagnosis not present

## 2019-09-21 DIAGNOSIS — D500 Iron deficiency anemia secondary to blood loss (chronic): Secondary | ICD-10-CM

## 2019-09-21 DIAGNOSIS — N920 Excessive and frequent menstruation with regular cycle: Secondary | ICD-10-CM | POA: Insufficient documentation

## 2019-09-21 LAB — CBC WITH DIFFERENTIAL (CANCER CENTER ONLY)
Abs Immature Granulocytes: 0.01 10*3/uL (ref 0.00–0.07)
Basophils Absolute: 0 10*3/uL (ref 0.0–0.1)
Basophils Relative: 1 %
Eosinophils Absolute: 0.1 10*3/uL (ref 0.0–0.5)
Eosinophils Relative: 1 %
HCT: 43.4 % (ref 36.0–46.0)
Hemoglobin: 13.8 g/dL (ref 12.0–15.0)
Immature Granulocytes: 0 %
Lymphocytes Relative: 25 %
Lymphs Abs: 1.7 10*3/uL (ref 0.7–4.0)
MCH: 26.9 pg (ref 26.0–34.0)
MCHC: 31.8 g/dL (ref 30.0–36.0)
MCV: 84.6 fL (ref 80.0–100.0)
Monocytes Absolute: 0.5 10*3/uL (ref 0.1–1.0)
Monocytes Relative: 8 %
Neutro Abs: 4.3 10*3/uL (ref 1.7–7.7)
Neutrophils Relative %: 65 %
Platelet Count: 381 10*3/uL (ref 150–400)
RBC: 5.13 MIL/uL — ABNORMAL HIGH (ref 3.87–5.11)
RDW: 14.6 % (ref 11.5–15.5)
WBC Count: 6.6 10*3/uL (ref 4.0–10.5)
nRBC: 0 % (ref 0.0–0.2)

## 2019-09-21 LAB — CMP (CANCER CENTER ONLY)
ALT: 19 U/L (ref 0–44)
AST: 17 U/L (ref 15–41)
Albumin: 4.3 g/dL (ref 3.5–5.0)
Alkaline Phosphatase: 77 U/L (ref 38–126)
Anion gap: 9 (ref 5–15)
BUN: 8 mg/dL (ref 6–20)
CO2: 21 mmol/L — ABNORMAL LOW (ref 22–32)
Calcium: 10.4 mg/dL — ABNORMAL HIGH (ref 8.9–10.3)
Chloride: 107 mmol/L (ref 98–111)
Creatinine: 0.99 mg/dL (ref 0.44–1.00)
GFR, Est AFR Am: >60 mL/min (ref >60)
GFR, Estimated: >60 mL/min (ref >60)
Glucose, Bld: 105 mg/dL — ABNORMAL HIGH (ref 70–99)
Potassium: 4.1 mmol/L (ref 3.5–5.1)
Sodium: 137 mmol/L (ref 135–145)
Total Bilirubin: 0.4 mg/dL (ref 0.3–1.2)
Total Protein: 7.8 g/dL (ref 6.5–8.1)

## 2019-09-21 LAB — IRON AND TIBC
Iron: 225 ug/dL — ABNORMAL HIGH (ref 41–142)
Saturation Ratios: 60 % — ABNORMAL HIGH (ref 21–57)
TIBC: 376 ug/dL (ref 236–444)
UIBC: 151 ug/dL (ref 120–384)

## 2019-09-21 LAB — FERRITIN: Ferritin: 25 ng/mL (ref 11–307)

## 2019-09-21 LAB — RETIC PANEL
Immature Retic Fract: 13.6 % (ref 2.3–15.9)
RBC.: 5.16 MIL/uL — ABNORMAL HIGH (ref 3.87–5.11)
Retic Count, Absolute: 82 10*3/uL (ref 19.0–186.0)
Retic Ct Pct: 1.6 % (ref 0.4–3.1)
Reticulocyte Hemoglobin: 32.5 pg (ref >27.9)

## 2019-09-22 ENCOUNTER — Telehealth: Payer: Self-pay | Admitting: Hematology and Oncology

## 2019-09-22 NOTE — Telephone Encounter (Signed)
Scheduled per los. Called and spoke with patient confirmed appt  

## 2019-09-25 ENCOUNTER — Telehealth: Payer: Self-pay | Admitting: *Deleted

## 2019-09-25 NOTE — Telephone Encounter (Signed)
TCT patient regarding recent lab results.  Spoke with her an dinformed her that her iron levels are improving on oral iron. No need for IV iron at this time.. We will see her back in 7months to continue monitor. She voiced understanding. She is aware of future appts.

## 2019-09-25 NOTE — Telephone Encounter (Signed)
-----   Message from Orson Slick, MD sent at 09/25/2019  1:24 PM EDT ----- Please let Mallory Robinson know that her iron levels are improving on PO iron therapy. There is no indication for IV iron at this time. We will see her back in 3 months to continue monitoring.  ----- Message ----- From: Buel Ream, Lab In [place] Sent: 09/21/2019   8:23 AM EDT To: Orson Slick, MD

## 2019-12-09 ENCOUNTER — Encounter: Payer: Self-pay | Admitting: Adult Health

## 2019-12-09 ENCOUNTER — Ambulatory Visit: Payer: Medicaid Other | Admitting: Adult Health

## 2019-12-09 VITALS — BP 146/106 | HR 77 | Ht 61.0 in | Wt 164.0 lb

## 2019-12-09 DIAGNOSIS — I63411 Cerebral infarction due to embolism of right middle cerebral artery: Secondary | ICD-10-CM | POA: Diagnosis not present

## 2019-12-09 DIAGNOSIS — G43109 Migraine with aura, not intractable, without status migrainosus: Secondary | ICD-10-CM

## 2019-12-09 MED ORDER — PROPRANOLOL HCL 20 MG PO TABS: 20.0000 mg | ORAL_TABLET | Freq: Two times a day (BID) | ORAL | 3 refills | Status: DC

## 2019-12-09 NOTE — Patient Instructions (Signed)
Continue aspirin 81 mg daily for secondary stroke prevention   Continue propranolol 20mg  twice daily for headache prevention  Continue to follow up with PCP regarding blood pressure management  Maintain strict control of hypertension with blood pressure goal below 130/90 and cholesterol with LDL cholesterol (bad cholesterol) goal below 70 mg/dL.      Followup in the future with me in 6 months or call earlier if needed       Thank you for coming to see [place] at [facility]. I hope we have been able to provide you high quality care today.  You may receive a patient satisfaction survey over the next few weeks. We would appreciate your feedback and comments so that we may continue to improve ourselves and the health of our patients.

## 2019-12-09 NOTE — Progress Notes (Signed)
[name] [address] [place]. [place] [address] [phone]       STROKE FOLLOW UP NOTE  [name] Date of Birth:  1975/09/05 Medical Record Number:  [id number]   Reason for Referral:  stroke follow up    CHIEF COMPLAINT:  Chief Complaint  Patient presents with  . Stroke Symptoms    rm 9    HPI:   Today, 12/09/2019, Mallory Robinson returns for 9-month stroke follow-up after prior visit with Dr. Leonie Robinson in 05/2019 for right MCA territory strokes.  She has been stable since prior visit without residual deficits and denies new or reoccurring stroke/TIA symptoms.  She has remained on aspirin 81 mg daily without bleeding or bruising.  Blood pressure today 146/104. Monitors at home typically 120s/80-90.  She remains on propranolol 20 mg twice daily for migraine prophylaxis with only occasional visual auras approximately 1 time weekly and will experience a mild tension headache later that day.  Typically aggravated by bright light or prolonged use of computer time.  She denies any reoccurring migraine headaches.  She continues to follow closely with oncology for iron deficiency anemia.  No further concerns at this time.    History provided for reference purposes only Update 06/09/2019 Dr. Leonie Robinson : She returns for follow-up after last office visit in October 2019.  She has been hospitalized twice in November 2020 as well as in February 2021.  She was admitted with left-sided weakness for strokelike episode in November 2020 and treated with TPA.  MRI was negative for acute infarct at that time.  CT head and neck showed recurrence of 2 mm right carotid thrombus.  Echocardiogram was unremarkable LDL cholesterol was 32 mg percent and hemoglobin A1c was 5.6.  She she also had headaches and was discharged on aspirin for stroke and Topamax for migraine prevention.  This time she was readmitted on 04/09/2019 with left-sided numbness as well as increased weakness and headaches.  MRI  scan showed small punctate infarcts in the right MCA territory and CT angiogram of the head and neck again showed unchanged appearance of the right carotid bulb thrombus.  2D echo showed was not repeated since 20 November had normal ejection fraction.  Transcranial Doppler bubble study was negative.  LDL cholesterol was 27 mg percent hemoglobin A1c was 5.5.  Antiphospholipid antibodies were negative.  Patient had been on aspirin 81 prior to admission but since she has significant anemia during this admission requiring iron transfusion dual antiplatelet therapy was not initiated.  Patient states she is doing much better her last hemoglobin on 04/22/2019 was 11.6 and hematocrit 40.7.  Her iron studies also showed significant improvement compared to February.  She is feeling much better with more stamina.  She is also had no recurrent headaches or migraines.  She in fact has no deficits.  She has no complaints today.  Initial visit 02/10/2019 JM: Mallory Robinson is being seen today, 02/10/2019, for hospital follow-up accompanied by her wife.  Prior stroke deficits of left hemiparesis and balance impairment greatly improved previously with minimal to no residual deficits but reports worsening left hemiparesis, balance and dizziness after recent admission along with left-sided hypersensitivity.  She also complains of short-term memory loss feeling "scatterbrained" and has difficulty focusing.  She is able to maintain doing ADLs and majority of IADLs independently.  She was working with outpatient PT/ST after discharge but currently on hold as she was diagnosed with COVID-19 on 01/26/2019 and was quarantined until recently.  She  denies any residual symptoms from COVID-19 but does report increased fatigue and slightly worsening left-sided weakness.  She has continued on Topamax 50 mg twice daily but continues to experience visual aura migraines on a daily basis consisting of flashing lights in right periphery followed by a  headache associated with nausea/vomiting, photophobia and phonophobia.  Migraine will subside after resting in a dark quiet area.  She has not experienced any reoccurring worsening left leg weakness accompanied with migraine.  She has not used any ibuprofen or acetaminophen.  She does endorse increased anxiety and stress due to medical condition with underlying history of bipolar disorder.  She does not currently follow with psychiatry.  She has continued on aspirin 325 mg daily without bleeding or bruising.  Blood pressure today 124/86.  No further concerns at this time.  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  She received IV tPA for suspected stroke.  Felt as though left-sided weakness and sensory changes may be complicated migraine episodes especially with mild headaches after episodes with history of migraines and similar visual disturbances.  Also per note, admitted to being under significant stress with recently starting a new job.  CT head and MRI brain negative for acute stroke.  MRA head unremarkable.  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  CT perfusion unremarkable.  2D echo normal EF without cardiac source of embolus identified.  EEG negative for seizures.  Initiated Topamax 50 mg twice daily with improvement headaches.  Recommended restarting aspirin 81 mg daily for secondary stroke prevention.  HTN stable.  LDL 32.  Other stroke risk factors  include EtOH use, obesity, history of stroke/TIA and family history of stroke.  Recommended outpatient therapy and discharged in stable condition.     ROS:   14 system review of systems performed and negative with exception of those listed in HPI  PMH:  Past Medical History:  Diagnosis Date  . Bipolar 1 disorder ([place])   . Complication of anesthesia   . Depression   . Hypertension   . Low vitamin B12 level 04/11/2019  . PONV (postoperative nausea and vomiting)   . Stroke [facility])     PSH:  Past Surgical History:  Procedure Laterality Date  . CESAREAN SECTION     X2  . HERNIA REPAIR    . TEE WITHOUT CARDIOVERSION N/A 11/13/2017   Procedure: TRANSESOPHAGEAL ECHOCARDIOGRAM (TEE) bubble study;  Surgeon: Skeet Latch, MD;  Location: [facility];  Service: Cardiovascular;  Laterality: N/A;    Social History:  Social History   Socioeconomic History  . Marital status: Married    Spouse name: Not on file  . Number of children: Not on file  . Years of education: Not on file  . Highest education level: Not on file  Occupational History  . Not on file  Tobacco Use  . Smoking status: Never Smoker  . Smokeless tobacco: Never Used  Vaping Use  . Vaping Use: Never used  Substance and Sexual Activity  . Alcohol use: Yes    Comment: Occasional  . Drug use:  No  . Sexual activity: Yes    Birth control/protection: None  Other Topics Concern  . Not on file  Social History Narrative  . Not on file   Social Determinants of Health   Financial Resource Strain:   . Difficulty of Paying Living Expenses: Not on file  Food Insecurity:   . Worried About Charity fundraiser in the Last Year: Not on file  . Ran Out of Food in the Last Year: Not on file  Transportation Needs:   . Lack of Transportation (Medical): Not on file  . Lack of Transportation (Non-Medical): Not on file  Physical Activity:   . Days of Exercise per Week: Not on file  . Minutes of Exercise per Session: Not on  file  Stress:   . Feeling of Stress : Not on file  Social Connections:   . Frequency of Communication with Friends and Family: Not on file  . Frequency of Social Gatherings with Friends and Family: Not on file  . Attends Religious Services: Not on file  . Active Member of Clubs or Organizations: Not on file  . Attends Archivist Meetings: Not on file  . Marital Status: Not on file  Intimate Partner Violence:   . Fear of Current or Ex-Partner: Not on file  . Emotionally Abused: Not on file  . Physically Abused: Not on file  . Sexually Abused: Not on file    Family History:  Family History  Problem Relation Age of Onset  . Hypertension Mother   . Stroke Father   . Stroke Maternal Aunt     Medications:   Current Outpatient Medications on File Prior to Visit  Medication Sig Dispense Refill  . aspirin EC 81 MG tablet Take 81 mg by mouth daily.    . ferrous sulfate 325 (65 FE) MG tablet Take 1 tablet (325 mg total) by mouth 2 (two) times daily with a meal. 60 tablet 0  . folic acid (FOLVITE) 1 MG tablet Take 2 tablets (2 mg total) by mouth daily. (Patient taking differently: Take 1 mg by mouth daily. ) 30 tablet 0  . propranolol (INDERAL) 20 MG tablet TAKE 1 TABLET BY MOUTH TWICE DAILY 180 tablet 1  . pyridOXINE (B-6) 50 MG tablet Take 1 tablet (50 mg total) by mouth daily.    . vitamin B-12 (CYANOCOBALAMIN) 1000 MCG tablet Take 1 tablet (1,000 mcg total) by mouth daily.     No current facility-administered medications on file prior to visit.    Allergies:   Allergies  Allergen Reactions  . Food Itching and Swelling    Melons : Reaction to Melons (lips and eyes swelled)  . Penicillins Hives, Itching and Other (See Comments)    Hair fell out Did it involve swelling of the face/tongue/throat, SOB, or low BP? No Did it involve sudden or severe rash/hives, skin peeling, or any reaction on the inside of your mouth or nose? Yes Did you need to seek medical attention at a  hospital or doctor's office? In hospital reaction When did it last happen?44 yrs old If all above answers are "NO", may proceed with cephalosporin use.     Physical Exam  Vitals:   12/09/19 0948  BP: (!) 156/104  Pulse: 77  Weight: 164 lb (74.4 kg)  Height: 5\' 1"  (1.549 m)   Body mass index is 30.99 kg/m. No exam data present  General: well developed, well nourished,  pleasant middle-aged African-American female, seated, in no evident distress  Head: head normocephalic and atraumatic.   Neck: supple with no carotid or supraclavicular bruits Cardiovascular: regular rate and rhythm, no murmurs Musculoskeletal: no deformity Skin:  no rash/petichiae Vascular:  Normal pulses all extremities   Neurologic Exam Mental Status: Awake and fully alert.  Normal speech and language.  Oriented to place and time. Recent and remote memory intact during visit. Attention span, concentration and fund of knowledge appropriate. Mood and affect appropriate.  Cranial Nerves: Fundoscopic exam reveals sharp disc margins. Pupils equal, briskly reactive to light. Extraocular movements full without nystagmus. Visual fields full to confrontation. Hearing intact. Facial sensation intact.  Mild left lower facial weakness. Motor: Normal bulk and tone.  Full strength tested in all extremities except slightly decreased left hand dexterity and mild left hip flexor weakness. Sensory.:  Intact to light touch, vibratory and pinprick sensation Coordination: Rapid alternating movements normal in all extremities except slightly decreased left hand. Finger-to-nose and heel-to-shin performed accurately bilaterally Gait and Station: Arises from chair without difficulty. Stance is normal. Gait demonstrates  normal stride length and balance without use of assistive device Reflexes: 1+ and symmetric. Toes downgoing.       ASSESSMENT/PLAN: Mallory Robinson is a 44 y.o. year old female with multiple punctate right MCA  territory infarcts likely embolic related to recurrent right carotid thrombus in 03/2019.  History of sudden onset left-sided weakness, left-sided paresthesias and visual disturbances followed by migraine x4 episodes on 12/27/2018 with suspected stroke treated with TPA versus possible complicated migraine episodes.  CTA shows possible residual versus new thrombus or carotid web. 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  Was initiated on warfarin with repeat imaging 02/11/2018 without residual dissection therefore were turned discontinued and placed on aspirin.  Vascular risk factors include HTN, prior stroke, EtOH use and migraines.      1. R MCA territory infarcts 2. Hx of prior strokes 3. Hx of stroke like episode s/p tPA: a. Residual deficits of mild left-sided weakness which has been stable with improvement b. Etiology right carotid thrombus -repeat CTA neck 06/24/2019 showed resolution c. Continue aspirin 81 mg daily for secondary stroke prevention d. Discussed secondary stroke prevention measures and importance of close PCP follow-up for aggressive stroke risk factor management 4. Visual aura migraines:  a. Stable without recent migraines  b. Does experience occasional tension type headache and discussed prevention strategies such as limiting computer time, use of bluelight filters, taking frequent breaks and stress reduction techniques c. Continue propranolol 20 mg twice daily for migraine prophylaxis d. Previously on Topamax but discontinued due to concerns of memory loss  e. Discussed avoidance of migraine triggers along with stress reduction techniques.     Follow up in 6 months or call earlier if needed   I spent 25 minutes of face-to-face and non-face-to-face time with patient.  This included previsit chart review, lab review, study review, order entry, electronic health record  documentation, patient education and discussion regarding history of prior strokes and repeat imaging, importance of managing stroke risk factors and secondary stroke prevention, visual aura migraines with ongoing use of propranolol and occasional tension headaches and answered all other questions to patient satisfaction   Frann Rider, AGNP-BC  [facility] [address] [place], [place] [address]  Phone [phone] Fax [phone] Note: This document was prepared with digital dictation and possible smart phrase technology. Any transcriptional errors that result from this process are unintentional.

## 2019-12-11 NOTE — Progress Notes (Signed)
I agree with the above plan 

## 2019-12-25 ENCOUNTER — Encounter: Payer: Self-pay | Admitting: Hematology and Oncology

## 2019-12-25 ENCOUNTER — Inpatient Hospital Stay: Payer: Medicaid Other

## 2019-12-25 ENCOUNTER — Other Ambulatory Visit: Payer: Self-pay

## 2019-12-25 ENCOUNTER — Other Ambulatory Visit: Payer: Self-pay | Admitting: Hematology and Oncology

## 2019-12-25 ENCOUNTER — Inpatient Hospital Stay: Payer: Medicaid Other | Attending: Hematology and Oncology | Admitting: Hematology and Oncology

## 2019-12-25 VITALS — BP 125/98 | HR 100 | Temp 97.7°F | Resp 17 | Ht 61.0 in | Wt 162.3 lb

## 2019-12-25 DIAGNOSIS — D500 Iron deficiency anemia secondary to blood loss (chronic): Secondary | ICD-10-CM

## 2019-12-25 DIAGNOSIS — N920 Excessive and frequent menstruation with regular cycle: Secondary | ICD-10-CM | POA: Insufficient documentation

## 2019-12-25 DIAGNOSIS — Z23 Encounter for immunization: Secondary | ICD-10-CM | POA: Diagnosis not present

## 2019-12-25 DIAGNOSIS — K5900 Constipation, unspecified: Secondary | ICD-10-CM | POA: Insufficient documentation

## 2019-12-25 LAB — CMP (CANCER CENTER ONLY)
ALT: 17 U/L (ref 0–44)
AST: 16 U/L (ref 15–41)
Albumin: 4.3 g/dL (ref 3.5–5.0)
Alkaline Phosphatase: 51 U/L (ref 38–126)
Anion gap: 9 (ref 5–15)
BUN: 11 mg/dL (ref 6–20)
CO2: 22 mmol/L (ref 22–32)
Calcium: 9.4 mg/dL (ref 8.9–10.3)
Chloride: 107 mmol/L (ref 98–111)
Creatinine: 0.8 mg/dL (ref 0.44–1.00)
GFR, Estimated: >60 mL/min (ref >60)
Glucose, Bld: 98 mg/dL (ref 70–99)
Potassium: 4 mmol/L (ref 3.5–5.1)
Sodium: 138 mmol/L (ref 135–145)
Total Bilirubin: 0.4 mg/dL (ref 0.3–1.2)
Total Protein: 7.3 g/dL (ref 6.5–8.1)

## 2019-12-25 LAB — IRON AND TIBC
Iron: 128 ug/dL (ref 41–142)
Saturation Ratios: 40 % (ref 21–57)
TIBC: 323 ug/dL (ref 236–444)
UIBC: 195 ug/dL (ref 120–384)

## 2019-12-25 LAB — CBC WITH DIFFERENTIAL (CANCER CENTER ONLY)
Abs Immature Granulocytes: 0.01 10*3/uL (ref 0.00–0.07)
Basophils Absolute: 0 10*3/uL (ref 0.0–0.1)
Basophils Relative: 1 %
Eosinophils Absolute: 0.1 10*3/uL (ref 0.0–0.5)
Eosinophils Relative: 1 %
HCT: 42 % (ref 36.0–46.0)
Hemoglobin: 13.5 g/dL (ref 12.0–15.0)
Immature Granulocytes: 0 %
Lymphocytes Relative: 22 %
Lymphs Abs: 1.3 10*3/uL (ref 0.7–4.0)
MCH: 26.9 pg (ref 26.0–34.0)
MCHC: 32.1 g/dL (ref 30.0–36.0)
MCV: 83.8 fL (ref 80.0–100.0)
Monocytes Absolute: 0.5 10*3/uL (ref 0.1–1.0)
Monocytes Relative: 8 %
Neutro Abs: 3.9 10*3/uL (ref 1.7–7.7)
Neutrophils Relative %: 68 %
Platelet Count: 305 10*3/uL (ref 150–400)
RBC: 5.01 MIL/uL (ref 3.87–5.11)
RDW: 15 % (ref 11.5–15.5)
WBC Count: 5.7 10*3/uL (ref 4.0–10.5)
nRBC: 0 % (ref 0.0–0.2)

## 2019-12-25 LAB — RETIC PANEL
Immature Retic Fract: 7.6 % (ref 2.3–15.9)
RBC.: 5 MIL/uL (ref 3.87–5.11)
Retic Count, Absolute: 47 10*3/uL (ref 19.0–186.0)
Retic Ct Pct: 0.9 % (ref 0.4–3.1)
Reticulocyte Hemoglobin: 32.9 pg (ref >27.9)

## 2019-12-25 LAB — FERRITIN: Ferritin: 24 ng/mL (ref 11–307)

## 2019-12-25 MED ORDER — INFLUENZA VAC SPLIT QUAD 0.5 ML IM SUSY
PREFILLED_SYRINGE | INTRAMUSCULAR | Status: AC
  Filled 2019-12-25: qty 0.5

## 2019-12-25 MED ORDER — INFLUENZA VAC SPLIT QUAD 0.5 ML IM SUSY
0.5000 mL | PREFILLED_SYRINGE | Freq: Once | INTRAMUSCULAR | Status: AC
  Administered 2019-12-25: 0.5 mL via INTRAMUSCULAR

## 2019-12-25 NOTE — Patient Instructions (Signed)
Provided flu vaccine info to patient

## 2019-12-25 NOTE — Progress Notes (Signed)
[place] [phone]   [phone]  PROGRESS NOTE  Patient Care Team: Nolene Ebbs, MD as PCP - General (Internal Medicine)  Hematological/Oncological History # Iron Deficiency Anemia 2/2 GYN Blood Loss #Thrombocytosis 1) 03/04/2013: WBC 6.7, Hgb 9.6, MCV 63.5, Plt 215 2) 11/10/2017: WBC 16.8, hgb 6.0, Plt 1301, MCV 61.2 3) 04/10/2019: Hematology consulted while patient hospitalized for CVA. WBC 10.1, Hgb 8.0, MCV 63.7, Plt 956. Iron studies showed Iron 16, TIBC 550, Sat 3%, Ferritin 2. 4) 04/11/19: received 1 dose of IV feraheme 510mg   5) 04/22/2019: return clinic visit. WBC 7.0, Hgb 11.6, MCV 74.5, Plt 235. Iron 265, TIBC 425, Sat 62%, Ferritin 139.  6) 06/22/2019: WBC 6.6, Hgb 12.6, Plt 450. Iron 20, TIBC 421, Iron sat 5%, Ferritin 22.  7) 09/21/2019: WBC 6.6, Hgb 13.8, MCV 84.6, Plt 381  Interval History:  Mallory Robinson 44 y.o. female with medical history significant for iron deficiency anemia who presents for a follow up visit. The was last seen on 09/21/2019 at which time her Hgb had improved to 13.8, but iron levels remained low. In the interim since her last visit she has been well with no hospitalizations or ED visits.    On exam today Mallory Robinson notes she has been doing well since our last visit in August.  She reports that she has been exercising and intentionally losing weight and has done an impressive job losing 14 pounds since her last visit.  She reports that she has been tolerating the iron pills well other than some occasional constipation for which she has been taking senna tea.  She notes that she is not having any stomach pain but that there is some occasional gassiness with these medications.  She notes that her energy has been good overall.  She notes that she continues to have heavy menstrual cycles and is currently on a cycle right now.  She is currently working to be seen by a gynecology group on [employer].  Otherwise she currently  denies any fevers, chills, sweats, nausea, vomiting or diarrhea.  A full 10 point ROS is listed below.  MEDICAL HISTORY:  Past Medical History:  Diagnosis Date  . Bipolar 1 disorder ([place])   . Complication of anesthesia   . Depression   . Hypertension   . Low vitamin B12 level 04/11/2019  . PONV (postoperative nausea and vomiting)   . Stroke [facility])     SURGICAL HISTORY: Past Surgical History:  Procedure Laterality Date  . CESAREAN SECTION     X2  . HERNIA REPAIR    . TEE WITHOUT CARDIOVERSION N/A 11/13/2017   Procedure: TRANSESOPHAGEAL ECHOCARDIOGRAM (TEE) bubble study;  Surgeon: Skeet Latch, MD;  Location: [facility];  Service: Cardiovascular;  Laterality: N/A;    ALLERGIES:  is allergic to food and penicillins.  MEDICATIONS:  Current Outpatient Medications  Medication Sig Dispense Refill  . aspirin EC 81 MG tablet Take 81 mg by mouth daily.    . ferrous sulfate 325 (65 FE) MG tablet Take 1 tablet (325 mg total) by mouth 2 (two) times daily with a meal. 60 tablet 0  . folic acid (FOLVITE) 1 MG tablet Take 2 tablets (2 mg total) by mouth daily. (Patient taking differently: Take 1 mg by mouth daily. ) 30 tablet 0  . propranolol (INDERAL) 20 MG tablet Take 1 tablet (20 mg total) by mouth 2 (two) times daily. 180 tablet 3  . pyridOXINE (B-6) 50 MG tablet Take 1 tablet (  50 mg total) by mouth daily.    . vitamin B-12 (CYANOCOBALAMIN) 1000 MCG tablet Take 1 tablet (1,000 mcg total) by mouth daily.     No current facility-administered medications for this visit.    REVIEW OF SYSTEMS:   Constitutional: ( - ) fevers, ( - )  chills , ( - ) night sweats Eyes: ( - ) blurriness of vision, ( - ) double vision, ( - ) watery eyes Ears, nose, mouth, throat, and face: ( - ) mucositis, ( - ) sore throat Respiratory: ( - ) cough, ( - ) dyspnea, ( - ) wheezes Cardiovascular: ( - ) palpitation, ( - ) chest discomfort, ( - ) lower extremity swelling Gastrointestinal:  ( - ) nausea, ( - )  heartburn, ( - ) change in bowel habits Skin: ( - ) abnormal skin rashes Lymphatics: ( - ) new lymphadenopathy, ( - ) easy bruising Neurological: ( - ) numbness, ( - ) tingling, ( - ) new weaknesses Behavioral/Psych: ( - ) mood change, ( - ) new changes  All other systems were reviewed with the patient and are negative.  PHYSICAL EXAMINATION: ECOG PERFORMANCE STATUS: 1 - Symptomatic but completely ambulatory  Vitals:   12/25/19 0829  BP: (!) 125/98  Pulse: 100  Resp: 17  Temp: 97.7 F (36.5 C)  SpO2: 100%   Filed Weights   12/25/19 0829  Weight: 162 lb 4.8 oz (73.6 kg)    GENERAL: well appearing middle aged [place] American female. Alert, no distress and comfortable SKIN: skin color, texture, turgor are normal, no rashes or significant lesions EYES: conjunctiva are pale and non-injected, sclera clear LUNGS: clear to auscultation and percussion with normal breathing effort HEART: regular rate & rhythm and no murmurs and no lower extremity edema Musculoskeletal: no cyanosis of digits and no clubbing  PSYCH: alert & oriented x 3, fluent speech NEURO: no focal motor/sensory deficits  LABORATORY DATA:  I have reviewed the data as listed CBC Latest Ref Rng & Units 12/25/2019 09/21/2019 06/22/2019  WBC 4.0 - 10.5 K/uL 5.7 6.6 6.6  Hemoglobin 12.0 - 15.0 g/dL 13.5 13.8 12.6  Hematocrit 36 - 46 % 42.0 43.4 41.3  Platelets 150 - 400 K/uL 305 381 450(H)    CMP Latest Ref Rng & Units 12/25/2019 09/21/2019 06/22/2019  Glucose 70 - 99 mg/dL 98 105(H) 89  BUN 6 - 20 mg/dL 11 8 10   Creatinine 0.44 - 1.00 mg/dL 0.80 0.99 1.02(H)  Sodium 135 - 145 mmol/L 138 137 141  Potassium 3.5 - 5.1 mmol/L 4.0 4.1 4.4  Chloride 98 - 111 mmol/L 107 107 105  CO2 22 - 32 mmol/L 22 21(L) 24  Calcium 8.9 - 10.3 mg/dL 9.4 10.4(H) 9.9  Total Protein 6.5 - 8.1 g/dL 7.3 7.8 8.1  Total Bilirubin 0.3 - 1.2 mg/dL 0.4 0.4 0.3  Alkaline Phos 38 - 126 U/L 51 77 88  AST 15 - 41 U/L 16 17 23   ALT 0 - 44 U/L 17 19 30      RADIOGRAPHIC STUDIES:  No results found.  ASSESSMENT & PLAN Mallory Robinson 44 y.o. female with medical history significant for iron deficiency anemia who presents for a follow up visit.  In the interim since her last visit Mallory Robinson has been taking daily iron and has intentionally lose 14 lbs of weight.   On exam today Mallory Robinson is doing quite well and has been taking her p.o. iron as prescribed with only some constipation which is  managed with senna tea.  She is currently changing her diet and increasing her exercise and doing quite well.  Unfortunately her iron levels still remain low despite her appropriate iron supplementation.  On review the labs today the patient's iron deficiency anemia has corrected and she now has a normal hemoglobin, normal MCV, and her platelet count has returned to normal.  Iron studies are still pending at the time of writing this note.  At this time would recommend that she continue p.o. iron supplementation as long as she is menstruating.  She notes that her periods are becoming more sporadic and that may be a sign that she is beginning to go through menopause.  Overall her findings are most consistent with anemia secondary to GYN blood loss and therefore I do not think GI evaluation is warranted at this time.  We will plan to see her back in approximately 3 months time in order to assure that her iron stores continue to be replete.   # Iron Deficiency Anemia Secondary to GYN Blood Loss # Thrombocytosis, Secondary to Iron Deficiency. Resolved --today will recheck CBC, CMP, retic panel, and iron studies --continue PO iron 325mg  daily. Recommend she take this with a source of vitamin C (orange juice) to improve absorption --Hgb and CBC has normalized. Iron levels still have not corrected.  --continued outpatient GYN evaluation for management of heavy menstrual cycles. No clear indication for GI evaluation at this time. --RTC in 3 months   #Constipation,  stable --patient currently self treating with tea.  --recommend continued senna-docusate, with the addition of magnesium citrate to help move her bowels PRN -- additionally can consider miralax to help move bowels --continue to monitor   No orders of the defined types were placed in this encounter.  All questions were answered. The patient knows to call the clinic with any problems, questions or concerns.  A total of more than 30 minutes were spent on this encounter and over half of that time was spent on counseling and coordination of care as outlined above.   Ledell Peoples, MD Department of Hematology/Oncology [place] [facility] Phone: [phone] Pager: [phone] Email: [email or web address]  12/25/2019 3:42 PM

## 2019-12-28 ENCOUNTER — Telehealth: Payer: Self-pay | Admitting: *Deleted

## 2019-12-28 ENCOUNTER — Telehealth: Payer: Self-pay | Admitting: Hematology and Oncology

## 2019-12-28 NOTE — Telephone Encounter (Signed)
Scheduled per los. Called, not able to leave msg. Mailed printout  

## 2019-12-28 NOTE — Telephone Encounter (Signed)
-----   Message from Orson Slick, MD sent at 12/28/2019  8:37 AM EST ----- Please let Mallory Robinson know that her iron stores are still running low. She should continue to take the iron pills as prescribed. We will see her back in about 3 months to check her labs again.  ----- Message ----- From: Interface, Lab In [place] Sent: 12/25/2019   8:28 AM EST To: Orson Slick, MD

## 2019-12-28 NOTE — Telephone Encounter (Signed)
TCT patient regarding lab results from last week. Spoke with patient and advised that her iron stores are still running low. Dr. Lorenso Courier recommends she continue her oral iron replacement. She voiced understanding. She is aware of her upcoming appts in 3 months

## 2020-03-10 ENCOUNTER — Encounter: Payer: Self-pay | Admitting: Physical Therapy

## 2020-03-10 NOTE — Therapy (Signed)
[facility] [address] [place], [place], [address] Phone: [phone]   Fax:  [phone]  Patient Details  Name: ELLSIE VIOLETTE MRN: [id number] Date of Birth: April 21, 1975 Referring Provider:  No ref. provider found  Encounter Date: 03/10/2020   PHYSICAL THERAPY DISCHARGE SUMMARY  Visits from Start of Care: 2  Current functional level related to goals / functional outcomes: Unable to formally assess; pt did not return to therapy after second visit.  Pt D/C due to not returning for remaining visits.   Remaining deficits: Impaired strength, pain   Education / Equipment: HEP  Plan: Patient agrees to discharge.  Patient goals were not met. Patient is being discharged due to not returning since the last visit.  ?????     Rico Junker, PT, DPT 03/10/20    12:51 PM    [facility] [address] [place], [place], [address] Phone: [phone]   Fax:  [phone]

## 2020-03-25 ENCOUNTER — Encounter: Payer: Self-pay | Admitting: Hematology and Oncology

## 2020-03-25 ENCOUNTER — Inpatient Hospital Stay: Payer: Medicaid Other

## 2020-03-25 ENCOUNTER — Other Ambulatory Visit: Payer: Self-pay

## 2020-03-25 ENCOUNTER — Other Ambulatory Visit: Payer: Self-pay | Admitting: Hematology and Oncology

## 2020-03-25 ENCOUNTER — Inpatient Hospital Stay: Payer: Medicaid Other | Attending: Hematology and Oncology | Admitting: Hematology and Oncology

## 2020-03-25 VITALS — BP 131/100 | HR 69 | Temp 97.0°F | Resp 20 | Ht 61.0 in | Wt 155.4 lb

## 2020-03-25 DIAGNOSIS — K5900 Constipation, unspecified: Secondary | ICD-10-CM | POA: Insufficient documentation

## 2020-03-25 DIAGNOSIS — D500 Iron deficiency anemia secondary to blood loss (chronic): Secondary | ICD-10-CM

## 2020-03-25 DIAGNOSIS — N920 Excessive and frequent menstruation with regular cycle: Secondary | ICD-10-CM | POA: Diagnosis not present

## 2020-03-25 LAB — RETIC PANEL
Immature Retic Fract: 24.2 % — ABNORMAL HIGH (ref 2.3–15.9)
RBC.: 4.31 MIL/uL (ref 3.87–5.11)
Retic Count, Absolute: 101.3 10*3/uL (ref 19.0–186.0)
Retic Ct Pct: 2.4 % (ref 0.4–3.1)
Reticulocyte Hemoglobin: 26.4 pg — ABNORMAL LOW (ref >27.9)

## 2020-03-25 LAB — CBC WITH DIFFERENTIAL (CANCER CENTER ONLY)
Abs Immature Granulocytes: 0 10*3/uL (ref 0.00–0.07)
Basophils Absolute: 0 10*3/uL (ref 0.0–0.1)
Basophils Relative: 1 %
Eosinophils Absolute: 0.1 10*3/uL (ref 0.0–0.5)
Eosinophils Relative: 2 %
HCT: 36.5 % (ref 36.0–46.0)
Hemoglobin: 11.4 g/dL — ABNORMAL LOW (ref 12.0–15.0)
Immature Granulocytes: 0 %
Lymphocytes Relative: 34 %
Lymphs Abs: 1.9 10*3/uL (ref 0.7–4.0)
MCH: 26.4 pg (ref 26.0–34.0)
MCHC: 31.2 g/dL (ref 30.0–36.0)
MCV: 84.5 fL (ref 80.0–100.0)
Monocytes Absolute: 0.5 10*3/uL (ref 0.1–1.0)
Monocytes Relative: 8 %
Neutro Abs: 3.2 10*3/uL (ref 1.7–7.7)
Neutrophils Relative %: 55 %
Platelet Count: 351 10*3/uL (ref 150–400)
RBC: 4.32 MIL/uL (ref 3.87–5.11)
RDW: 13.3 % (ref 11.5–15.5)
WBC Count: 5.7 10*3/uL (ref 4.0–10.5)
nRBC: 0 % (ref 0.0–0.2)

## 2020-03-25 LAB — IRON AND TIBC
Iron: 28 ug/dL — ABNORMAL LOW (ref 41–142)
Saturation Ratios: 8 % — ABNORMAL LOW (ref 21–57)
TIBC: 355 ug/dL (ref 236–444)
UIBC: 328 ug/dL (ref 120–384)

## 2020-03-25 LAB — CMP (CANCER CENTER ONLY)
ALT: 9 U/L (ref 0–44)
AST: 12 U/L — ABNORMAL LOW (ref 15–41)
Albumin: 4 g/dL (ref 3.5–5.0)
Alkaline Phosphatase: 61 U/L (ref 38–126)
Anion gap: 8 (ref 5–15)
BUN: 8 mg/dL (ref 6–20)
CO2: 23 mmol/L (ref 22–32)
Calcium: 9.2 mg/dL (ref 8.9–10.3)
Chloride: 108 mmol/L (ref 98–111)
Creatinine: 0.87 mg/dL (ref 0.44–1.00)
GFR, Estimated: >60 mL/min (ref >60)
Glucose, Bld: 86 mg/dL (ref 70–99)
Potassium: 3.9 mmol/L (ref 3.5–5.1)
Sodium: 139 mmol/L (ref 135–145)
Total Bilirubin: 0.2 mg/dL — ABNORMAL LOW (ref 0.3–1.2)
Total Protein: 6.8 g/dL (ref 6.5–8.1)

## 2020-03-25 LAB — FERRITIN: Ferritin: 25 ng/mL (ref 11–307)

## 2020-03-25 NOTE — Progress Notes (Signed)
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 WBC 10.1, Hgb 8.0, MCV 63.7, Plt 956. Iron studies showed Iron 16, TIBC 550, Sat 3%, Ferritin 2. 4) 04/11/19: received 1 dose of IV feraheme 510mg   5) 04/22/2019: return clinic visit. WBC 7.0, Hgb 11.6, MCV 74.5, Plt 235. Iron 265, TIBC 425, Sat 62%, Ferritin 139.  6) 06/22/2019: WBC 6.6, Hgb 12.6, Plt 450. Iron 20, TIBC 421, Iron sat 5%, Ferritin 22.  7) 09/21/2019: WBC 6.6, Hgb 13.8, MCV 84.6, Plt 381  Interval History:  Mallory Robinson 45 y.o. female with medical history significant for iron deficiency anemia who presents for a follow up visit. The was last seen on 12/25/2019 at which time her Hgb was stable at 13.5, but iron levels remained low. In the interim since her last visit she has been well with no hospitalizations or ED visits.    On exam today Mallory Robinson notes she has been well in the interim since her last visit.  She has been compliant with her iron pills on a daily basis and notes it is not causing her much trouble.  She notes that her menstrual cycles have actually become more regular and less heavy as result of her normal hemoglobin.  She reports that she use approximately 3-4 pads per day on the heaviest days of her menstrual cycle.  She is no longer having any issues with shortness of breath, fatigue, or ice cravings.  She also denies any other overt signs of bleeding such as dark stools or nosebleeds/bruising.  Otherwise she currently denies any fevers, chills, sweats, nausea, vomiting or diarrhea.  A full 10 point ROS is listed below.  MEDICAL HISTORY:   Past Medical History:  Diagnosis Date  . Bipolar 1 disorder ([facility])   . Complication of anesthesia   . Depression   . Hypertension   . Low vitamin B12 level 04/11/2019  . PONV (postoperative nausea and vomiting)   . Stroke [facility])     SURGICAL HISTORY: Past Surgical History:  Procedure Laterality Date  . CESAREAN SECTION     X2  . HERNIA REPAIR    . TEE WITHOUT CARDIOVERSION N/A 11/13/2017   Procedure: TRANSESOPHAGEAL ECHOCARDIOGRAM (TEE) bubble study;  Surgeon: Skeet Latch, MD;  Location: [facility];  Service: Cardiovascular;  Laterality: N/A;    ALLERGIES:  is allergic to food and penicillins.  MEDICATIONS:  Current Outpatient Medications  Medication Sig Dispense Refill  . aspirin EC 81 MG tablet Take 81 mg by mouth daily.    . ferrous sulfate 325 (65 FE) MG tablet Take 1 tablet (325 mg total) by mouth 2 (two) times daily with a meal. 60 tablet 0  . folic acid (FOLVITE) 1 MG tablet Take 2 tablets (2 mg total) by mouth daily. (Patient taking differently: Take 1 mg by mouth daily. ) 30 tablet 0  . propranolol (INDERAL) 20 MG tablet Take 1 tablet (20 mg total) by mouth 2 (two) times daily. 180 tablet 3  . pyridOXINE (B-6) 50 MG tablet Take 1 tablet (50 mg total) by mouth daily.    . vitamin B-12 (CYANOCOBALAMIN) 1000 MCG tablet Take 1 tablet (1,000 mcg  total) by mouth daily.     No current facility-administered medications for this visit.    REVIEW OF SYSTEMS:   Constitutional: ( - ) fevers, ( - )  chills , ( - ) night sweats Eyes: ( - ) blurriness of vision, ( - ) double vision, ( - ) watery eyes Ears, nose, mouth, throat, and face: ( - ) mucositis, ( - ) sore throat Respiratory: ( - ) cough, ( - ) dyspnea, ( - ) wheezes Cardiovascular: ( - ) palpitation, ( - ) chest discomfort, ( - ) lower extremity swelling Gastrointestinal:  ( - ) nausea, ( - ) heartburn, ( - ) change in bowel habits Skin: ( - ) abnormal skin rashes Lymphatics: ( - ) new lymphadenopathy, ( - ) easy  bruising Neurological: ( - ) numbness, ( - ) tingling, ( - ) new weaknesses Behavioral/Psych: ( - ) mood change, ( - ) new changes  All other systems were reviewed with the patient and are negative.  PHYSICAL EXAMINATION: ECOG PERFORMANCE STATUS: 1 - Symptomatic but completely ambulatory  Vitals:   03/25/20 0840  BP: (!) 131/100  Pulse: 69  Resp: 20  Temp: (!) 97 F (36.1 C)   Filed Weights   03/25/20 0840  Weight: 155 lb 6.4 oz (70.5 kg)    GENERAL: well appearing middle aged [place] American female. Alert, no distress and comfortable SKIN: skin color, texture, turgor are normal, no rashes or significant lesions EYES: conjunctiva are pale and non-injected, sclera clear LUNGS: clear to auscultation and percussion with normal breathing effort HEART: regular rate & rhythm and no murmurs and no lower extremity edema Musculoskeletal: no cyanosis of digits and no clubbing  PSYCH: alert & oriented x 3, fluent speech NEURO: no focal motor/sensory deficits  LABORATORY DATA:  I have reviewed the data as listed CBC Latest Ref Rng & Units 03/25/2020 12/25/2019 09/21/2019  WBC 4.0 - 10.5 K/uL 5.7 5.7 6.6  Hemoglobin 12.0 - 15.0 g/dL 11.4(L) 13.5 13.8  Hematocrit 36.0 - 46.0 % 36.5 42.0 43.4  Platelets 150 - 400 K/uL 351 305 381    CMP Latest Ref Rng & Units 03/25/2020 12/25/2019 09/21/2019  Glucose 70 - 99 mg/dL 86 98 105(H)  BUN 6 - 20 mg/dL 8 11 8   Creatinine 0.44 - 1.00 mg/dL 0.87 0.80 0.99  Sodium 135 - 145 mmol/L 139 138 137  Potassium 3.5 - 5.1 mmol/L 3.9 4.0 4.1  Chloride 98 - 111 mmol/L 108 107 107  CO2 22 - 32 mmol/L 23 22 21(L)  Calcium 8.9 - 10.3 mg/dL 9.2 9.4 10.4(H)  Total Protein 6.5 - 8.1 g/dL 6.8 7.3 7.8  Total Bilirubin 0.3 - 1.2 mg/dL 0.2(L) 0.4 0.4  Alkaline Phos 38 - 126 U/L 61 51 77  AST 15 - 41 U/L 12(L) 16 17  ALT 0 - 44 U/L 9 17 19     RADIOGRAPHIC STUDIES:  No results found.  ASSESSMENT & PLAN Mallory Robinson 45 y.o. female with medical history significant  for iron deficiency anemia who presents for a follow up visit.    On exam today Mallory Robinson is doing quite well and has been taking her p.o. iron. Her energy levels are good and her weight is stable. She has had regular, less heavy menstrual cycles.   On review the labs today the patient's iron deficiency anemia had corrected and she had a normal hemoglobin, normal MCV, and her platelet count has returned to normal. Unfortunately her Hgb has dropped  down to 11.4 today.  Iron studies are still pending at the time of writing this note.  At this time would recommend that she continue p.o. iron supplementation as long as she is menstruating.  She notes that her periods are becoming more regular once she started iron therapy.  Overall her findings are most consistent with anemia secondary to GYN blood loss and therefore I do not think GI evaluation is warranted at this time.  We will plan to see her back in approximately 3 months time in order to assure that her iron stores continue to be replete.   # Iron Deficiency Anemia Secondary to GYN Blood Loss # Thrombocytosis, Secondary to Iron Deficiency. Resolved --today will recheck CBC, CMP, retic panel, and iron studies --continue PO iron 325mg  daily. Recommend she take this with a source of vitamin C (orange juice) to improve absorption --Hgb and CBC previously normalized. Iron levels still have not corrected.  Unfortunately Hgb is below target today --if iron levels appear to still be low, would recommend IV Feraheme 510 mg q. 7 days x 2 doses --continued outpatient GYN evaluation for management of heavy menstrual cycles. No clear indication for GI evaluation at this time. --RTC in 3 months   #Constipation, stable --patient currently self treating with tea.  --recommend continued senna-docusate, with the addition of magnesium citrate to help move her bowels PRN -- additionally can consider miralax to help move bowels --continue to monitor   No orders of  the defined types were placed in this encounter.  All questions were answered. The patient knows to call the clinic with any problems, questions or concerns.  A total of more than 30 minutes were spent on this encounter and over half of that time was spent on counseling and coordination of care as outlined above.   Ledell Peoples, MD Department of Hematology/Oncology [facility] Phone: [phone] Pager: [phone] Email: [email or web address]  03/25/2020 9:28 AM

## 2020-03-28 ENCOUNTER — Telehealth: Payer: Self-pay | Admitting: Hematology and Oncology

## 2020-03-28 NOTE — Telephone Encounter (Signed)
Scheduled appts per 2/4 los. Left voicemail with appt date and time.  

## 2020-03-31 ENCOUNTER — Telehealth: Payer: Self-pay | Admitting: *Deleted

## 2020-03-31 NOTE — Telephone Encounter (Signed)
-----   Message from Orson Slick, MD sent at 03/28/2020 10:17 AM EST ----- Please let Mrs. Wickware know that she has had continued drop in her iron levels. Her Hgb is at 11.6 and her ferritin is now 8. If she would like we can set her up for IV feraheme infusions (2 doses separated by 7 days) to boost her iron levels. If she would like to continue the PO iron therapy alone she can certainly do that, but I am concerned the pills alone are not enough to keep her Hgb up.   ----- Message ----- From: Interface, Lab In [place] Sent: 03/25/2020   8:33 AM EST To: Orson Slick, MD

## 2020-03-31 NOTE — Telephone Encounter (Signed)
TCT patient regarding lab results. No answer but was able to leave vm message for pt to return this call @ [phone]

## 2020-04-01 ENCOUNTER — Other Ambulatory Visit: Payer: Self-pay | Admitting: Hematology and Oncology

## 2020-04-01 DIAGNOSIS — D500 Iron deficiency anemia secondary to blood loss (chronic): Secondary | ICD-10-CM | POA: Insufficient documentation

## 2020-04-01 NOTE — Telephone Encounter (Signed)
Received call back from patient. Reviewed her lab results with her and advised that Dr. Lorenso Courier recommends IV at this time.  Pt is very good with this . Advised to expect a call from scheduling in the next week or so to schedule these IV iron infusions-pending insurance approval.  Mallory Robinson voiced understanding.

## 2020-04-04 ENCOUNTER — Telehealth: Payer: Self-pay | Admitting: Hematology and Oncology

## 2020-04-04 NOTE — Telephone Encounter (Signed)
Scheduled appt per 2/11 sch msg - left message for patient with appt date and time

## 2020-04-07 ENCOUNTER — Inpatient Hospital Stay (HOSPITAL_BASED_OUTPATIENT_CLINIC_OR_DEPARTMENT_OTHER): Payer: Medicaid Other

## 2020-04-07 ENCOUNTER — Other Ambulatory Visit: Payer: Self-pay

## 2020-04-07 ENCOUNTER — Ambulatory Visit: Payer: Medicaid Other

## 2020-04-07 VITALS — BP 160/111 | HR 66 | Temp 98.7°F | Resp 20

## 2020-04-07 DIAGNOSIS — K5900 Constipation, unspecified: Secondary | ICD-10-CM | POA: Diagnosis not present

## 2020-04-07 DIAGNOSIS — D500 Iron deficiency anemia secondary to blood loss (chronic): Secondary | ICD-10-CM

## 2020-04-07 DIAGNOSIS — N920 Excessive and frequent menstruation with regular cycle: Secondary | ICD-10-CM | POA: Diagnosis not present

## 2020-04-07 MED ORDER — SODIUM CHLORIDE 0.9 % IV SOLN
510.0000 mg | Freq: Once | INTRAVENOUS | Status: AC
  Administered 2020-04-07: 510 mg via INTRAVENOUS
  Filled 2020-04-07: qty 510

## 2020-04-07 MED ORDER — SODIUM CHLORIDE 0.9 % IV SOLN
Freq: Once | INTRAVENOUS | Status: AC
  Filled 2020-04-07: qty 250

## 2020-04-07 NOTE — Patient Instructions (Signed)

## 2020-04-14 ENCOUNTER — Inpatient Hospital Stay: Payer: Medicaid Other

## 2020-04-14 ENCOUNTER — Ambulatory Visit: Payer: Medicaid Other

## 2020-04-14 ENCOUNTER — Other Ambulatory Visit: Payer: Self-pay

## 2020-04-14 VITALS — BP 119/82 | HR 84 | Temp 98.4°F | Resp 18 | Wt 152.5 lb

## 2020-04-14 DIAGNOSIS — D500 Iron deficiency anemia secondary to blood loss (chronic): Secondary | ICD-10-CM

## 2020-04-14 DIAGNOSIS — K5900 Constipation, unspecified: Secondary | ICD-10-CM | POA: Diagnosis not present

## 2020-04-14 DIAGNOSIS — N920 Excessive and frequent menstruation with regular cycle: Secondary | ICD-10-CM | POA: Diagnosis not present

## 2020-04-14 MED ORDER — SODIUM CHLORIDE 0.9 % IV SOLN
510.0000 mg | Freq: Once | INTRAVENOUS | Status: AC
  Administered 2020-04-14: 510 mg via INTRAVENOUS
  Filled 2020-04-14: qty 510

## 2020-04-14 MED ORDER — SODIUM CHLORIDE 0.9 % IV SOLN
Freq: Once | INTRAVENOUS | Status: AC
  Filled 2020-04-14: qty 250

## 2020-04-14 NOTE — Patient Instructions (Signed)

## 2020-04-14 NOTE — Progress Notes (Signed)
Pt declined to stay for 30 min observation.. Pt reported no symptoms, VSS and pt discharged in stable condition, ambulatory to lobby.

## 2020-06-09 ENCOUNTER — Encounter: Payer: Self-pay | Admitting: Adult Health

## 2020-06-09 ENCOUNTER — Ambulatory Visit: Payer: Medicaid Other | Admitting: Adult Health

## 2020-06-09 NOTE — Progress Notes (Deleted)
[name] [address] [place]. [place] [address] [phone]       STROKE FOLLOW UP NOTE  [name] Date of Birth:  Jul 14, 1975 Medical Record Number:  [id number]   Reason for Referral:  stroke follow up    CHIEF COMPLAINT:  No chief complaint on file.   HPI:   Today, 06/09/2020, Mallory Robinson returns for 46-month stroke follow-up Doing well since prior visit without new stroke/TIA symptoms.  She denies residual stroke deficits. Remains on propranolol 20 mg twice daily for migraine prophylaxis *** Compliant on aspirin without associated side effects.  Blood pressure today ***       History provided for reference purposes only Update 12/09/2019 JM: Mallory Robinson returns for 46-month stroke follow-up after prior visit with Dr. Leonie Man in 05/2019 for right MCA territory strokes.  She has been stable since prior visit without residual deficits and denies new or reoccurring stroke/TIA symptoms.  She has remained on aspirin 81 mg daily without bleeding or bruising.  Blood pressure today 146/104. Monitors at home typically 120s/80-90.  She remains on propranolol 20 mg twice daily for migraine prophylaxis with only occasional visual auras approximately 1 time weekly and will experience a mild tension headache later that day.  Typically aggravated by bright light or prolonged use of computer time.  She denies any reoccurring migraine headaches.  She continues to follow closely with oncology for iron deficiency anemia.  No further concerns at this time.  Update 06/09/2019 Dr. Leonie Man : She returns for follow-up after last office visit in October 2019.  She has been hospitalized twice in November 2020 as well as in February 2021.  She was admitted with left-sided weakness for strokelike episode in November 2020 and treated with TPA.  MRI was negative for acute infarct at that time.  CT head and neck showed recurrence of 2 mm right carotid thrombus.  Echocardiogram was unremarkable  LDL cholesterol was 32 mg percent and hemoglobin A1c was 5.6.  She she also had headaches and was discharged on aspirin for stroke and Topamax for migraine prevention.  This time she was readmitted on 04/09/2019 with left-sided numbness as well as increased weakness and headaches.  MRI scan showed small punctate infarcts in the right MCA territory and CT angiogram of the head and neck again showed unchanged appearance of the right carotid bulb thrombus.  2D echo showed was not repeated since 20 November had normal ejection fraction.  Transcranial Doppler bubble study was negative.  LDL cholesterol was 27 mg percent hemoglobin A1c was 5.5.  Antiphospholipid antibodies were negative.  Patient had been on aspirin 81 prior to admission but since she has significant anemia during this admission requiring iron transfusion dual antiplatelet therapy was not initiated.  Patient states she is doing much better her last hemoglobin on 04/22/2019 was 11.6 and hematocrit 40.7.  Her iron studies also showed significant improvement compared to February.  She is feeling much better with more stamina.  She is also had no recurrent headaches or migraines.  She in fact has no deficits.  She has no complaints today.  Initial visit 02/10/2019 JM: Mallory Robinson is being seen today, 02/10/2019, for hospital follow-up accompanied by her wife. Prior stroke deficits of left hemiparesis and balance impairment greatly improved previously with minimal to no residual deficits but reports worsening left hemiparesis, balance and dizziness after recent admission along with left-sided hypersensitivity.  She also complains of short-term memory loss feeling "scatterbrained" and has difficulty focusing.  She is able to maintain doing ADLs and majority of IADLs independently.  She was working with outpatient PT/ST after discharge but currently on hold as she was diagnosed with COVID-19 on 01/26/2019 and was quarantined until recently.  She denies any  residual symptoms from COVID-19 but does report increased fatigue and slightly worsening left-sided weakness.  She has continued on Topamax 50 mg twice daily but continues to experience visual aura migraines on a daily basis consisting of flashing lights in right periphery followed by a headache associated with nausea/vomiting, photophobia and phonophobia.  Migraine will subside after resting in a dark quiet area.  She has not experienced any reoccurring worsening left leg weakness accompanied with migraine.  She has not used any ibuprofen or acetaminophen.  She does endorse increased anxiety and stress due to medical condition with underlying history of bipolar disorder.  She does not currently follow with psychiatry.  She has continued on aspirin 325 mg daily without bleeding or bruising.  Blood pressure today 124/86.  No further concerns at this time.  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  She received IV tPA for suspected stroke.  Felt as though left-sided weakness and sensory changes may be complicated migraine episodes especially with mild headaches after episodes with history of migraines and similar visual disturbances.  Also per note, admitted to being under significant stress with recently starting a new job.  CT head and MRI brain negative for acute stroke.  MRA head unremarkable.  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  CT perfusion  unremarkable.  2D echo normal EF without cardiac source of embolus identified.  EEG negative for seizures.  Initiated Topamax 50 mg twice daily with imp, 02/10/2019, for hospital follow-up accompanied by her wife.  Prior stroke deficits of left hemiparesis and balance impairment greatly improved previously with minimal to no residual deficits but reports worsening left hemiparesis, balance and dizziness after recent admission along with left-sided hypersensitivity.  She also complains of short-term memory loss feeling "scatterbrained" and has difficulty focusing.  She is able to maintain doing ADLs and majority of IADLs independently.  She was working with outpatient PT/ST after discharge but currently on hold as she was diagnosed with COVID-19 on 01/26/2019 and was quarantined until recently.  She denies any  residual symptoms from COVID-19 but does report increased fatigue and slightly worsening left-sided weakness.  She has continued on Topamax 50 mg twice daily but continues to experience visual aura migraines on a daily basis consisting of flashing lights in right periphery followed by a headache associated with nausea/vomiting, photophobia and phonophobia.  Migraine will subside after resting in a dark quiet area.  She has not experienced any reoccurring worsening left leg weakness accompanied with migraine.  She has not used any ibuprofen or acetaminophen.  She does endorse increased anxiety and stress due to medical condition with underlying history of bipolar disorder.  She does not currently follow with psychiatry.  She has continued on aspirin 325 mg daily without bleeding or bruising.  Blood pressure today 124/86.  No further concerns at this time.  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  She received IV tPA for suspected stroke.  Felt as though left-sided weakness and sensory changes may be complicated migraine episodes especially with mild headaches after episodes with history of migraines and similar visual disturbances.  Also per note, admitted to being under significant stress with recently starting a new job.  CT head and MRI brain negative for acute stroke.  MRA head unremarkable.  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  CT perfusion  unremarkable.  2D echo normal EF without cardiac source of embolus identified.  EEG negative for seizures.  Initiated Topamax 50 mg twice daily with improvement headaches.  Recommended restarting aspirin 81 mg daily for secondary stroke prevention.  HTN stable.  LDL 32.  Other stroke risk factors include EtOH use, obesity, history of stroke/TIA and family history of stroke.  Recommended outpatient therapy and discharged in stable condition.     ROS:   14 system review of systems performed and negative with exception of those listed in HPI  PMH:  Past Medical History:  Diagnosis Date  . Bipolar 1 disorder ([place])   . Complication of anesthesia   . Depression   . Hypertension   . Low vitamin B12 level 04/11/2019  . PONV (postoperative nausea and vomiting)   . Stroke [facility])     PSH:  Past Surgical History:  Procedure Laterality Date  . CESAREAN SECTION     X2  . HERNIA REPAIR    . TEE WITHOUT CARDIOVERSION N/A 11/13/2017   Procedure: TRANSESOPHAGEAL ECHOCARDIOGRAM (TEE) bubble study;  Surgeon: Skeet Latch, MD;  Location: [facility];  Service: Cardiovascular;  Laterality: N/A;    Social History:  Social History   Socioeconomic History  . Marital status: Married    Spouse name: Not on file  . Number of children: Not on file  . Years of education: Not on file  . Highest education level: Not on file  Occupational History  . Not on file  Tobacco Use  .  Smoking status: Never Smoker  . Smokeless tobacco: Never Used  Vaping Use  . Vaping Use: Never used  Substance and Sexual Activity  . Alcohol use: Yes    Comment: Occasional  . Drug use: No  . Sexual activity: Yes    Birth control/protection: None  Other Topics Concern  . Not on file  Social History Narrative  . Not on file   Social Determinants of Health   Financial Resource Strain: Not on file  Food Insecurity: Not on file  Transportation Needs: Not on file  Physical Activity: Not on file  Stress: Not on file   Social Connections: Not on file  Intimate Partner Violence: Not on file    Family History:  Family History  Problem Relation Age of Onset  . Hypertension Mother   . Stroke Father   . Stroke Maternal Aunt     Medications:   Current Outpatient Medications on File Prior to Visit  Medication Sig Dispense Refill  . aspirin EC 81 MG tablet Take 81 mg by mouth daily.    . ferrous sulfate 325 (65 FE) MG tablet Take 1 tablet (325 mg total) by mouth 2 (two) times daily with a meal. 60 tablet 0  . folic acid (FOLVITE) 1 MG tablet Take 2 tablets (2 mg total) by mouth daily. (Patient taking differently: Take 1 mg by mouth daily. ) 30 tablet 0  . propranolol (INDERAL) 20 MG tablet Take 1 tablet (20 mg total) by mouth 2 (two) times daily. 180 tablet 3  . pyridOXINE (B-6) 50 MG tablet Take 1 tablet (50 mg total) by mouth daily.    . vitamin B-12 (CYANOCOBALAMIN) 1000 MCG tablet Take 1 tablet (1,000 mcg total) by mouth daily.     No current facility-administered medications on file prior to visit.    Allergies:   Allergies  Allergen Reactions  . Food Itching and Swelling    Melons : Reaction to Melons (lips and eyes swelled)  . Penicillins Hives, Itching and Other (See Comments)    Hair fell out Did it involve swelling of the face/tongue/throat, SOB, or low BP? No Did it involve sudden or severe rash/hives, skin peeling, or any reaction on the inside of your mouth or nose? Yes Did you need to seek medical attention at a hospital or doctor's office? In hospital reaction When did it last happen?45 yrs old If all above answers are "NO", may proceed with cephalosporin use.     Physical Exam  There were no vitals filed for this visit. There is no height or weight on file to calculate BMI. No exam data present  General: well developed, well nourished,  pleasant middle-aged African-American female, seated, in no evident distress Head: head normocephalic and atraumatic.   Neck: supple  with no carotid or supraclavicular bruits Cardiovascular: regular rate and rhythm, no murmurs Musculoskeletal: no deformity Skin:  no rash/petichiae Vascular:  Normal pulses all extremities   Neurologic Exam Mental Status: Awake and fully alert.  Normal speech and language.  Oriented to place and time. Recent and remote memory intact during visit. Attention span, concentration and fund of knowledge appropriate. Mood and affect appropriate.  Cranial Nerves: Fundoscopic exam reveals sharp disc margins. Pupils equal, briskly reactive to light. Extraocular movements full without nystagmus. Visual fields full to confrontation. Hearing intact. Facial sensation intact.  Mild left lower facial weakness. Motor: Normal bulk and tone.  Full strength tested in all extremities except slightly decreased left hand dexterity and mild left hip  flexor weakness. Sensory.:  Intact to light touch, vibratory and pinprick sensation Coordination: Rapid alternating movements normal in all extremities except slightly decreased left hand. Finger-to-nose and heel-to-shin performed accurately bilaterally Gait and Station: Arises from chair without difficulty. Stance is normal. Gait demonstrates  normal stride length and balance without use of assistive device Reflexes: 1+ and symmetric. Toes downgoing.       ASSESSMENT/PLAN: AUDIE WIESER is a 45 y.o. year old female with multiple punctate right MCA territory infarcts likely embolic related to recurrent right carotid thrombus in 03/2019.  History of sudden onset left-sided weakness, left-sided paresthesias and visual disturbances followed by migraine x4 episodes on 12/27/2018 with suspected stroke treated with TPA versus possible complicated migraine episodes.  CTA shows possible residual versus new thrombus or carotid web. 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  Was  initiated on warfarin with repeat imaging 02/11/2018 without residual dissection therefore were turned discontinued and placed on aspirin.  Vascular risk factors include HTN, prior stroke, EtOH use and migraines.      1. R MCA territory infarcts 2. Hx of prior strokes 3. Hx of stroke like episode s/p tPA: a. Residual deficits of mild left-sided weakness which has been stable with improvement b. Etiology right carotid thrombus -repeat CTA neck 06/24/2019 showed resolution c. Continue aspirin 81 mg daily for secondary stroke prevention d. Discussed secondary stroke prevention measures and importance of close PCP follow-up for aggressive stroke risk factor management 4. Visual aura migraines:  a. Stable without recent migraines  b. Does experience occasional tension type headache and discussed prevention strategies such as limiting computer time, use of bluelight filters, taking frequent breaks and stress reduction techniques c. Continue propranolol 20 mg twice daily for migraine prophylaxis d. Previously on Topamax but discontinued due to concerns of memory loss  e. Discussed avoidance of migraine triggers along with stress reduction techniques.     Follow up in 6 months or call earlier if needed   CC:  [facility] provider: Dr. Theodoro Doing, MD    I spent 25 minutes of face-to-face and non-face-to-face time with patient.  This included previsit chart review, lab review, study review, order entry, electronic health record documentation, patient education and discussion regarding history of prior strokes and repeat imaging, importance of managing stroke risk factors and secondary stroke prevention, visual aura migraines with ongoing use of propranolol and occasional tension headaches and answered all other questions to patient satisfaction   Frann Rider, AGNP-BC  [facility] [address] [place], [place] [address]  Phone [phone] Fax  [phone] Note: This document was prepared with digital dictation and possible smart phrase technology. Any transcriptional errors that result from this process are unintentional.

## 2020-06-27 ENCOUNTER — Inpatient Hospital Stay: Payer: Medicaid Other | Attending: Hematology and Oncology | Admitting: Hematology and Oncology

## 2020-06-27 ENCOUNTER — Other Ambulatory Visit: Payer: Self-pay | Admitting: Hematology and Oncology

## 2020-06-27 ENCOUNTER — Inpatient Hospital Stay: Payer: Medicaid Other

## 2020-06-27 DIAGNOSIS — D500 Iron deficiency anemia secondary to blood loss (chronic): Secondary | ICD-10-CM

## 2021-06-08 ENCOUNTER — Other Ambulatory Visit: Payer: Self-pay

## 2021-06-08 ENCOUNTER — Ambulatory Visit
Admission: RE | Admit: 2021-06-08 | Discharge: 2021-06-08 | Disposition: A | Discharge status: Home / Self Care | Payer: Medicaid Other | Source: Ambulatory Visit | Attending: Urgent Care | Admitting: Urgent Care

## 2021-06-08 VITALS — BP 142/93 | HR 81 | Temp 99.4°F | Resp 16

## 2021-06-08 DIAGNOSIS — J22 Unspecified acute lower respiratory infection: Secondary | ICD-10-CM

## 2021-06-08 DIAGNOSIS — J040 Acute laryngitis: Secondary | ICD-10-CM

## 2021-06-08 MED ORDER — AZITHROMYCIN 250 MG PO TABS: ORAL_TABLET | ORAL | 0 refills | Status: DC

## 2021-06-08 MED ORDER — FLUTICASONE PROPIONATE 50 MCG/ACT NA SUSP: 1.0000 | Freq: Every day | NASAL | 0 refills | Status: DC

## 2021-06-08 NOTE — ED Triage Notes (Signed)
Patient reports illness started 4 days ago.  Patient has laryngitis, patient reports throat drainage, but no results when blowing nose.  Patient is tired and some nausea.   ?

## 2021-06-08 NOTE — Discharge Instructions (Addendum)
You have laryngitis likely from your post nasal drainage. ?You also have symptoms concerning for a lower respiratory tract infection. ?Please start taking the antibiotic as prescribed. ?Voice rest. Salt water gargles. ?Use the nasal spray to help with drainage. ?If voice is still gone in 3 more days, RTC for additional treatment options ? ?

## 2021-06-08 NOTE — ED Provider Notes (Signed)
?[facility] ? ? ? ?CSN: [id number] ?Arrival date & time: 06/08/21  1348 ? ? ?  ? ?History   ?Chief Complaint ?Chief Complaint  ?Patient presents with  ? Wheezing  ?  Loss of voice for 4 days - Entered by patient  ? Appointment  ?  14:00  ? ? ?HPI ?Mallory Robinson is a 46 y.o. female.  ? ?Pleasant 46 year old female presents today on her fourth day of significant nasal congestion, postnasal drainage, "tasting infection", loss of voice, and cough.  She states over the past 2 days she has had a fever greater than 100.4.  She reports anytime that she moves or walks, she feels tight in her chest.  Her cough is nonproductive.  She denies sinus pain or pressure.  She is not having any nasal drainage anteriorly just posteriorly and reports near complete loss of voice. She denies throat pain, but describes it as a "tickle". She does not smoke. She has tried OTCs without relief. ? ? ?Wheezing ?Associated symptoms: cough and fever   ? ?Past Medical History:  ?Diagnosis Date  ? Bipolar 1 disorder ([place])   ? Complication of anesthesia   ? Depression   ? Hypertension   ? Low vitamin B12 level 04/11/2019  ? PONV (postoperative nausea and vomiting)   ? Stroke [facility])   ? ? ?Patient Active Problem List  ? Diagnosis Date Noted  ? Iron deficiency anemia due to chronic blood loss 04/01/2020  ? OSA (obstructive sleep apnea): Probable 04/12/2019  ? Folate deficiency 04/11/2019  ? Low vitamin B12 level 04/11/2019  ? Stroke-like episode 04/10/2019  ? Acute nonintractable headache   ? Left-sided weakness   ? 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) 04/09/2019  ? Microcytic anemia 04/09/2019  ? Thrombocytosis 04/09/2019  ? Chronic migraine 04/09/2019  ? Hypertension 04/09/2019  ? Acute ischemic stroke ([place]) 12/27/2018  ? Cerebral embolism with cerebral infarction 11/11/2017  ? Acute CVA (cerebrovascular accident) ([place]) 11/11/2017  ? Neurological deficit present 11/10/2017  ? Bipolar I disorder ([place]) 03/05/2013  ? PTSD (post-traumatic  stress disorder) 03/05/2013  ? Alcohol dependence ([place]) 03/04/2013  ? ? ?Past Surgical History:  ?Procedure Laterality Date  ? CESAREAN SECTION    ? X2  ? HERNIA REPAIR    ? TEE WITHOUT CARDIOVERSION N/A 11/13/2017  ? Procedure: TRANSESOPHAGEAL ECHOCARDIOGRAM (TEE) bubble study;  Surgeon: Skeet Latch, MD;  Location: [place];  Service: Cardiovascular;  Laterality: N/A;  ? ? ?OB History   ?No obstetric history on file. ?  ? ? ? ?Home Medications   ? ?Prior to Admission medications   ?Medication Sig Start Date End Date Taking? Authorizing Provider  ?azithromycin (ZITHROMAX Z-PAK) 250 MG tablet Take two tabs PO day one in a single dose followed by one tab PO daily days 2-5 06/08/21  Yes Maki Sweetser L, PA  ?fluticasone (FLONASE) 50 MCG/ACT nasal spray Place 1 spray into both nostrils daily. 06/08/21  Yes Zelene Barga L, PA  ?aspirin EC 81 MG tablet Take 81 mg by mouth daily.    [provider]  ?ferrous sulfate 325 (65 FE) MG tablet Take 1 tablet (325 mg total) by mouth 2 (two) times daily with a meal. 11/13/17   Ghimire, Henreitta Leber, MD  ?folic acid (FOLVITE) 1 MG tablet Take 2 tablets (2 mg total) by mouth daily. ?Patient taking differently: Take 1 mg by mouth daily.  11/14/17   Ghimire, Henreitta Leber, MD  ?propranolol (INDERAL) 20 MG tablet Take 1 tablet (  20 mg total) by mouth 2 (two) times daily. 12/09/19   Frann Rider, NP  ?pyridOXINE (B-6) 50 MG tablet Take 1 tablet (50 mg total) by mouth daily. 04/12/19   Eugenie Filler, MD  ?vitamin B-12 (CYANOCOBALAMIN) 1000 MCG tablet Take 1 tablet (1,000 mcg total) by mouth daily. 04/12/19   Eugenie Filler, MD  ? ? ?Family History ?Family History  ?Problem Relation Age of Onset  ? Hypertension Mother   ? Stroke Father   ? Stroke Maternal Aunt   ? ? ?Social History ?Social History  ? ?Tobacco Use  ? Smoking status: Never  ? Smokeless tobacco: Never  ?Vaping Use  ? Vaping Use: Never used  ?Substance Use Topics  ? Alcohol use: Not Currently  ?  Comment:  none in 2 years -06/08/2021  ? Drug use: No  ? ? ? ?Allergies   ?Food and Penicillins ? ? ?Review of Systems ?Review of Systems  ?Constitutional:  Positive for fever.  ?HENT:  Positive for postnasal drip and voice change.   ?Respiratory:  Positive for cough and wheezing.   ? ? ?Physical Exam ?Triage Vital Signs ?ED Triage Vitals  ?Enc Vitals Group  ?   BP 06/08/21 1424 (!) 142/93  ?   Pulse Rate 06/08/21 1424 81  ?   Resp 06/08/21 1424 16  ?   Temp 06/08/21 1424 99.4 ?F (37.4 ?C)  ?   Temp Source 06/08/21 1424 Oral  ?   SpO2 06/08/21 1424 99 %  ?   Weight --   ?   Height --   ?   Head Circumference --   ?   Peak Flow --   ?   Pain Score 06/08/21 1453 3  ?   Pain Loc --   ?   Pain Edu? --   ?   Excl. in [place]? --   ? ?No data found. ? ?Updated Vital Signs ?BP (!) 142/93 (BP Location: Left Arm)   Pulse 81   Temp 99.4 ?F (37.4 ?C) (Oral)   Resp 16   LMP 05/28/2021 (Approximate)   SpO2 99%  ? ?Visual Acuity ?Right Eye Distance:   ?Left Eye Distance:   ?Bilateral Distance:   ? ?Right Eye Near:   ?Left Eye Near:    ?Bilateral Near:    ? ?Physical Exam ?Vitals and nursing note reviewed.  ?Constitutional:   ?   General: She is not in acute distress. ?   Appearance: Normal appearance. She is well-developed. She is ill-appearing. She is not toxic-appearing.  ?   Comments: Very hoarse voice  ?HENT:  ?   Head: Normocephalic and atraumatic.  ?   Right Ear: Tympanic membrane, ear canal and external ear normal. There is no impacted cerumen.  ?   Left Ear: Tympanic membrane, ear canal and external ear normal. There is no impacted cerumen.  ?   Nose: Nose normal. No congestion or rhinorrhea.  ?   Mouth/Throat:  ?   Mouth: Mucous membranes are moist.  ?   Pharynx: Oropharynx is clear. No oropharyngeal exudate or posterior oropharyngeal erythema.  ?Eyes:  ?   General: No scleral icterus.    ?   Right eye: No discharge.     ?   Left eye: No discharge.  ?   Extraocular Movements: Extraocular movements intact.  ?   Conjunctiva/sclera:  Conjunctivae normal.  ?   Pupils: Pupils are equal, round, and reactive to light.  ?Cardiovascular:  ?   Rate  and Rhythm: Normal rate and regular rhythm.  ?   Pulses: Normal pulses.  ?   Heart sounds: No murmur heard. ?Pulmonary:  ?   Effort: Pulmonary effort is normal. No accessory muscle usage, respiratory distress or retractions.  ?   Breath sounds: Normal air entry. No stridor, decreased air movement or transmitted upper airway sounds. No decreased breath sounds, wheezing, rhonchi (decreased breath sounds with bibasilar rhonchi) or rales.  ?Abdominal:  ?   Palpations: Abdomen is soft.  ?   Tenderness: There is no abdominal tenderness.  ?Musculoskeletal:     ?   General: No swelling.  ?   Cervical back: Normal range of motion and neck supple. No rigidity or tenderness.  ?Lymphadenopathy:  ?   Cervical: No cervical adenopathy.  ?Skin: ?   General: Skin is warm and dry.  ?   Capillary Refill: Capillary refill takes less than 2 seconds.  ?   Findings: No erythema or rash.  ?Neurological:  ?   General: No focal deficit present.  ?   Mental Status: She is alert and oriented to person, place, and time.  ?Psychiatric:     ?   Mood and Affect: Mood normal.  ? ? ? ?UC Treatments / Results  ?Labs ?(all labs ordered are listed, but only abnormal results are displayed) ?Labs Reviewed - No data to display ? ?EKG ? ? ?Radiology ?No results found. ? ?Procedures ?Procedures (including critical care time) ? ?Medications Ordered in UC ?Medications - No data to display ? ?Initial Impression / Assessment and Plan / UC Course  ?I have reviewed the triage vital signs and the nursing notes. ? ?Pertinent labs & imaging results that were available during my care of the patient were reviewed by me and considered in my medical decision making (see chart for details). ? ?  ? ?Lower respiratory tract infection - symptoms concerning for possible CAP. Pt has allergy to PCN therefore will start azithromycin to cover atypicals ?Laryngitis -  likely from post nasal drainage. Start flonase. Voice rest. Humidification for nasal passages. RTC precautions reviewed ? ?Final Clinical Impressions(s) / UC Diagnoses  ? ?Final diagnoses:  ?Lower respiratory tract in

## 2021-06-13 ENCOUNTER — Ambulatory Visit: Payer: Self-pay

## 2021-10-17 NOTE — Progress Notes (Deleted)
  Subjective:    Mallory Robinson - 46 y.o. female MRN [id number]  Date of birth: 07-12-1975  HPI  Mallory Robinson is to establish care.  Current issues and/or concerns:  ROS per HPI     Health Maintenance:  Health Maintenance Due  Topic Date Due   Hepatitis C Screening  Never done   TETANUS/TDAP  Never done   PAP SMEAR-Modifier  Never done   COLONOSCOPY (Pts 45-76yr Insurance coverage will need to be confirmed)  Never done   COVID-19 Vaccine (3 - Pfizer series) 08/17/2019   INFLUENZA VACCINE  09/19/2021     Past Medical History: Patient Active Problem List   Diagnosis Date Noted   Iron deficiency anemia due to chronic blood loss 04/01/2020   OSA (obstructive sleep apnea): Probable 04/12/2019   Folate deficiency 04/11/2019   Low vitamin B12 level 04/11/2019   Stroke-like episode 04/10/2019   Acute nonintractable headache    Left-sided weakness    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 04/09/[place]   Microcytic anemia 04/09/2019   Thrombocytosis 04/09/2019   Chronic migraine 04/09/2019   Hypertension 04/09/2019   [place] 12/27/[place]   Cerebral embolism with cerebral infarction 11/11/2017   [place] 11/[place]   Neurological deficit present 11/10/2017   [place] disorder [place] 03/05/2013   PTSD (post-traumatic stress disorder) 03/05/2013   Alcohol dependence ([place] 03/04/2013      Social History   reports that she has never smoked. She has never used smokeless tobacco. She reports that she does not currently use alcohol. She reports that she does not use drugs.   Family History  family history includes Hypertension in her mother; Stroke in her father and maternal aunt.   Medications: reviewed and updated   Objective:   Physical Exam There were no vitals taken for this visit. Physical Exam      Assessment & Plan:         Patient was given clear instructions to go to Emergency Department or return to  medical center if symptoms don't improve, worsen, or new problems develop.The patient verbalized understanding.  I discussed the assessment and treatment plan with the patient. The patient was provided an opportunity to ask questions and all were answered. The patient agreed with the plan and demonstrated an understanding of the instructions.   The patient was advised to call back or seek an in-person evaluation if the symptoms worsen or if the condition fails to improve as anticipated.    ADurene Fruits NP 10/17/2021, 12:57 PM [facility]

## 2021-10-26 ENCOUNTER — Ambulatory Visit: Payer: Medicaid Other | Admitting: Family

## 2021-10-26 DIAGNOSIS — Z7689 Persons encountering health services in other specified circumstances: Secondary | ICD-10-CM

## 2022-01-16 ENCOUNTER — Ambulatory Visit: Payer: Medicaid Other | Admitting: Occupational Medicine

## 2022-01-16 ENCOUNTER — Ambulatory Visit
Admission: EM | Admit: 2022-01-16 | Discharge: 2022-01-16 | Disposition: A | Discharge status: Home / Self Care | Payer: Medicaid Other | Attending: Internal Medicine | Admitting: Internal Medicine

## 2022-01-16 VITALS — BP 140/85 | HR 81 | Temp 97.5°F

## 2022-01-16 DIAGNOSIS — Z20822 Contact with and (suspected) exposure to covid-19: Secondary | ICD-10-CM

## 2022-01-16 DIAGNOSIS — J069 Acute upper respiratory infection, unspecified: Secondary | ICD-10-CM

## 2022-01-16 LAB — RESP PANEL BY RT-PCR (FLU A&B, COVID) ARPGX2
Influenza A by PCR: NEGATIVE
Influenza B by PCR: NEGATIVE
SARS Coronavirus 2 by RT PCR: NEGATIVE

## 2022-01-16 MED ORDER — ALBUTEROL SULFATE (2.5 MG/3ML) 0.083% IN NEBU
2.5000 mg | INHALATION_SOLUTION | Freq: Once | RESPIRATORY_TRACT | Status: AC
  Administered 2022-01-16: 2.5 mg via RESPIRATORY_TRACT

## 2022-01-16 MED ORDER — ALBUTEROL SULFATE HFA 108 (90 BASE) MCG/ACT IN AERS: 1.0000 | INHALATION_SPRAY | Freq: Four times a day (QID) | RESPIRATORY_TRACT | 0 refills | Status: DC | PRN

## 2022-01-16 MED ORDER — BENZONATATE 100 MG PO CAPS: 100.0000 mg | ORAL_CAPSULE | Freq: Three times a day (TID) | ORAL | 0 refills | Status: DC | PRN

## 2022-01-16 MED ORDER — FLUTICASONE PROPIONATE 50 MCG/ACT NA SUSP: 1.0000 | Freq: Every day | NASAL | 0 refills | Status: DC

## 2022-01-16 NOTE — ED Triage Notes (Addendum)
Pt presents to uc with co of hoarseness, cough, nausea and otalgia since yesterday. Covid neg at work. Pt reports no otc medications.

## 2022-01-16 NOTE — ED Provider Notes (Signed)
[facility]    CSN: [id number] Arrival date & time: 01/16/22  1120      History   Chief Complaint Chief Complaint  Patient presents with   URI    HPI Mallory Robinson is a 46 y.o. female.   Patient presents with voice hoarseness, cough, nasal congestion, scratchy throat, ear pain that started yesterday.  She reports that she is having a sensation of not being able to take a deep breath as well that started today.  Denies chest pain, shortness of breath, nausea, vomiting, diarrhea, abdominal pain, sore throat.  Reports that most of her coworkers have tested positive for COVID-19.  She went to the occupational health nurse at work today and had a COVID test that was negative.  She reports that she has not taken any medications to alleviate symptoms.  Patient reports that she had asthma approximately 20 years ago but has not had any complications since.  Patient also has elevated blood pressure reading.  She reports that she takes propranolol but has not taken it today.  Denies headache, dizziness, blurred vision.   URI   Past Medical History:  Diagnosis Date   Bipolar 1 disorder ([place])    Complication of anesthesia    Depression    Hypertension    Low vitamin B12 level 04/11/2019   PONV (postoperative nausea and vomiting)    Stroke [facility])     Patient Active Problem List   Diagnosis Date Noted   Iron deficiency anemia due to chronic blood loss 04/01/2020   OSA (obstructive sleep apnea): Probable 04/12/2019   Folate deficiency 04/11/2019   Low vitamin B12 level 04/11/2019   Stroke-like episode 04/10/2019   Acute nonintractable headache    Left-sided weakness    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[place]) 04/09/2019   Microcytic anemia 04/09/2019   Thrombocytosis 04/09/2019   Chronic migraine 04/09/2019   Hypertension 04/09/2019   Acute ischemic stroke ([place]) 12/27/2018   Cerebral embolism with cerebral infarction 11/11/2017   Acute CVA (cerebrovascular accident)  ([place]) 11/11/2017   Neurological deficit present 11/10/2017   Bipolar I disorder ([place]) 03/05/2013   PTSD (post-traumatic stress disorder) 03/05/2013   Alcohol dependence ([place]) 03/04/2013    Past Surgical History:  Procedure Laterality Date   CESAREAN SECTION     X2   HERNIA REPAIR     TEE WITHOUT CARDIOVERSION N/A 11/13/2017   Procedure: TRANSESOPHAGEAL ECHOCARDIOGRAM (TEE) bubble study;  Surgeon: Skeet Latch, MD;  Location: [place];  Service: Cardiovascular;  Laterality: N/A;    OB History   No obstetric history on file.      Home Medications    Prior to Admission medications   Medication Sig Start Date End Date Taking? Authorizing Provider  albuterol (VENTOLIN HFA) 108 (90 Base) MCG/ACT inhaler Inhale 1-2 puffs into the lungs every 6 (six) hours as needed for wheezing or shortness of breath. 01/16/22  Yes , Hildred Alamin E, FNP  benzonatate (TESSALON) 100 MG capsule Take 1 capsule (100 mg total) by mouth every 8 (eight) hours as needed for cough. 01/16/22  Yes , Hildred Alamin E, FNP  fluticasone (FLONASE) 50 MCG/ACT nasal spray Place 1 spray into both nostrils daily. 01/16/22  Yes , Hildred Alamin E, FNP  oxycodone-acetaminophen (LYNOX) 5-300 MG tablet Take 1 tablet by mouth every 8 (eight) hours as needed for pain.   Yes [provider]  aspirin EC 81 MG tablet Take 81 mg by mouth daily.    [provider]  azithromycin (ZITHROMAX Z-PAK)  250 MG tablet Take two tabs PO day one in a single dose followed by one tab PO daily days 2-5 Patient not taking: Reported on 01/16/2022 06/08/21   Geryl Councilman L, PA  DULoxetine (CYMBALTA) 60 MG capsule Take 60 mg by mouth daily. Patient not taking: Reported on 01/16/2022    [provider]  ferrous sulfate 325 (65 FE) MG tablet Take 1 tablet (325 mg total) by mouth 2 (two) times daily with a meal. 11/13/17   Ghimire, Henreitta Leber, MD  folic acid (FOLVITE) 1 MG tablet Take 2 tablets (2 mg total) by mouth daily. Patient  taking differently: Take 1 mg by mouth daily.  11/14/17   Ghimire, Henreitta Leber, MD  hydrOXYzine (ATARAX) 25 MG tablet Take 25 mg by mouth 3 (three) times daily as needed. Patient not taking: Reported on 01/16/2022    [provider]  propranolol (INDERAL) 20 MG tablet Take 1 tablet (20 mg total) by mouth 2 (two) times daily. 12/09/19   Frann Rider, NP  pyridOXINE (B-6) 50 MG tablet Take 1 tablet (50 mg total) by mouth daily. 04/12/19   Eugenie Filler, MD  vitamin B-12 (CYANOCOBALAMIN) 1000 MCG tablet Take 1 tablet (1,000 mcg total) by mouth daily. 04/12/19   Eugenie Filler, MD    Family History Family History  Problem Relation Age of Onset   Hypertension Mother    Stroke Father    Stroke Maternal Aunt     Social History Social History   Tobacco Use   Smoking status: Never   Smokeless tobacco: Never  Vaping Use   Vaping Use: Never used  Substance Use Topics   Alcohol use: Not Currently    Comment: none in 2 years -06/08/2021   Drug use: No     Allergies   Food and Penicillins   Review of Systems Review of Systems Per HPI  Physical Exam Triage Vital Signs ED Triage Vitals  Enc Vitals Group     BP 01/16/22 1334 (!) 186/115     Pulse Rate 01/16/22 1334 82     Resp 01/16/22 1334 20     Temp 01/16/22 1334 98 F (36.7 C)     Temp Source 01/16/22 1334 Oral     SpO2 01/16/22 1334 98 %     Weight --      Height --      Head Circumference --      Peak Flow --      Pain Score 01/16/22 1333 1     Pain Loc --      Pain Edu? --      Excl. in [place]? --    No data found.  Updated Vital Signs BP (!) 186/115   Pulse 82   Temp 98 F (36.7 C) (Oral)   Resp 20   SpO2 98%   Visual Acuity Right Eye Distance:   Left Eye Distance:   Bilateral Distance:    Right Eye Near:   Left Eye Near:    Bilateral Near:     Physical Exam Constitutional:      General: She is not in acute distress.    Appearance: Normal appearance. She is not toxic-appearing or  diaphoretic.  HENT:     Head: Normocephalic and atraumatic.     Right Ear: Tympanic membrane and ear canal normal.     Left Ear: Tympanic membrane and ear canal normal.     Nose: Congestion present.     Mouth/Throat:  Mouth: Mucous membranes are moist.     Pharynx: No posterior oropharyngeal erythema.  Eyes:     Extraocular Movements: Extraocular movements intact.     Conjunctiva/sclera: Conjunctivae normal.     Pupils: Pupils are equal, round, and reactive to light.  Cardiovascular:     Rate and Rhythm: Normal rate and regular rhythm.     Pulses: Normal pulses.     Heart sounds: Normal heart sounds.  Pulmonary:     Effort: Pulmonary effort is normal. No respiratory distress.     Breath sounds: Normal breath sounds. No stridor. No wheezing, rhonchi or rales.  Abdominal:     General: Abdomen is flat. Bowel sounds are normal.     Palpations: Abdomen is soft.  Musculoskeletal:        General: Normal range of motion.     Cervical back: Normal range of motion.  Skin:    General: Skin is warm and dry.  Neurological:     General: No focal deficit present.     Mental Status: She is alert and oriented to person, place, and time. Mental status is at baseline.     Cranial Nerves: Cranial nerves 2-12 are intact.     Sensory: Sensation is intact.     Motor: Motor function is intact.     Coordination: Coordination is intact.     Gait: Gait is intact.  Psychiatric:        Mood and Affect: Mood normal.        Behavior: Behavior normal.      UC Treatments / Results  Labs (all labs ordered are listed, but only abnormal results are displayed) Labs Reviewed  RESP PANEL BY RT-PCR (FLU A&B, COVID) ARPGX2    EKG   Radiology No results found.  Procedures Procedures (including critical care time)  Medications Ordered in UC Medications  albuterol (PROVENTIL) (2.5 MG/3ML) 0.083% nebulizer solution 2.5 mg (2.5 mg Nebulization Given 01/16/22 1353)    Initial Impression /  Assessment and Plan / UC Course  I have reviewed the triage vital signs and the nursing notes.  Pertinent labs & imaging results that were available during my care of the patient were reviewed by me and considered in my medical decision making (see chart for details).     Patient presents with symptoms likely from a viral upper respiratory infection. Differential includes bacterial pneumonia, sinusitis, allergic rhinitis, COVID-19, flu, RSV.  Highly suspicious of COVID-19 given places close exposure despite negative rapid test today.  Will send for COVID and flu PCR.  Patient is nontoxic appearing and not in need of emergent medical intervention.  Albuterol nebulizer treatment administered in urgent care given patient's report of not being able to take a deep breath.  She reported improvement in symptoms after this medication.  Will send albuterol inhaler for patient to take consistently and as needed.  Vital signs and oxygen are stable so do not think that emergent evaluation is necessary.  Recommended symptom control with medications to help treat symptoms.  Patient sent prescriptions.  Patient has elevated blood pressure reading but reports that she has not taken her medication today.  Patient advised to restart her medication at previously prescribed dose and to monitor blood pressure very closely at home with her home blood pressure cuff.  No signs of hypertensive urgency on exam at this time as she is asymptomatic regarding blood pressure.  Therefore, do not think that emergent evaluation is necessary.  Patient was given strict return and ER  precautions.  I am still going to prescribe albuterol despite propranolol possibly lowering the effectiveness of albuterol as I do think benefits outweigh risks.  Return if symptoms fail to improve.  Discussed strict return and ER precautions.  Patient states understanding and is agreeable.  Discharged with PCP followup.  Final Clinical Impressions(s) / UC  Diagnoses   Final diagnoses:  Viral upper respiratory tract infection with cough  Close exposure to COVID-19 virus     Discharge Instructions      You have a viral upper respiratory infection which should run its course and self resolve with symptomatic treatment as we discussed.  I have prescribed you 3 medications including an inhaler to help alleviate symptoms.  Please follow-up if symptoms persist or worsen.  Please start taking your blood pressure medication again and monitor very closely at home.  Follow-up with primary care doctor or urgent care if remains elevated.    ED Prescriptions     Medication Sig Dispense Auth. Provider   albuterol (VENTOLIN HFA) 108 (90 Base) MCG/ACT inhaler Inhale 1-2 puffs into the lungs every 6 (six) hours as needed for wheezing or shortness of breath. 1 each [place], Hildred Alamin E, FNP   fluticasone [facility]) 50 MCG/ACT nasal spray Place 1 spray into both nostrils daily. 16 g , Hildred Alamin E, [place]   benzonatate (TESSALON) 100 MG capsule Take 1 capsule (100 mg total) by mouth every 8 (eight) hours as needed for cough. 21 capsule [place], Michele Rockers, [place]      PDMP not reviewed this encounter.   Teodora Medici, [place] 01/16/22 [phone]

## 2022-01-16 NOTE — Progress Notes (Signed)
Pt complains of symptoms starting last night and getting worse. HA cough hot losing voice and chest tightness hard to breath at times. Plans to go to [facility] supervisor aware. Covid test Negative. Given Saline spray and educated on OTC medications to take.

## 2022-01-16 NOTE — Discharge Instructions (Signed)
You have a viral upper respiratory infection which should run its course and self resolve with symptomatic treatment as we discussed.  I have prescribed you 3 medications including an inhaler to help alleviate symptoms.  Please follow-up if symptoms persist or worsen.  Please start taking your blood pressure medication again and monitor very closely at home.  Follow-up with primary care doctor or urgent care if remains elevated.

## 2022-03-22 ENCOUNTER — Ambulatory Visit: Payer: Medicaid Other | Admitting: Family Medicine

## 2022-03-22 ENCOUNTER — Encounter: Payer: Self-pay | Admitting: Family Medicine

## 2022-03-22 VITALS — BP 136/92 | HR 78 | Temp 97.1°F | Resp 16 | Ht 63.5 in | Wt 174.0 lb

## 2022-03-22 DIAGNOSIS — R531 Weakness: Secondary | ICD-10-CM

## 2022-03-22 DIAGNOSIS — I10 Essential (primary) hypertension: Secondary | ICD-10-CM

## 2022-03-22 DIAGNOSIS — D500 Iron deficiency anemia secondary to blood loss (chronic): Secondary | ICD-10-CM

## 2022-03-22 DIAGNOSIS — Z7689 Persons encountering health services in other specified circumstances: Secondary | ICD-10-CM | POA: Diagnosis not present

## 2022-03-22 MED ORDER — CARVEDILOL 6.25 MG PO TABS: 6.2500 mg | ORAL_TABLET | Freq: Two times a day (BID) | ORAL | 1 refills | Status: DC

## 2022-03-22 NOTE — Progress Notes (Signed)
Patient is here to established care with provider today. Patient has many health concern they would like to discuss with provider today  Care gaps discuss at appointment today  

## 2022-03-23 NOTE — Progress Notes (Signed)
New Patient Office Visit  Subjective    Patient ID: Mallory Robinson, female    DOB: [birth year]  Age: 47 y.o. MRN: [id number]  CC:  Chief Complaint  Patient presents with   Establish Care    HPI Mallory Robinson presents to establish care and for review of chronic med issues. Patient denies acute complaints.    Outpatient Encounter Medications as of 03/22/2022  Medication Sig   aspirin EC 81 MG tablet Take 81 mg by mouth daily.   carvedilol (COREG) 6.25 MG tablet Take 1 tablet (6.25 mg total) by mouth 2 (two) times daily with a meal.   ferrous sulfate 325 (65 FE) MG tablet Take 1 tablet (325 mg total) by mouth 2 (two) times daily with a meal.   propranolol (INDERAL) 20 MG tablet Take 1 tablet (20 mg total) by mouth 2 (two) times daily.   [DISCONTINUED] albuterol (VENTOLIN HFA) 108 (90 Base) MCG/ACT inhaler Inhale 1-2 puffs into the lungs every 6 (six) hours as needed for wheezing or shortness of breath.   [DISCONTINUED] azithromycin (ZITHROMAX Z-PAK) 250 MG tablet Take two tabs PO day one in a single dose followed by one tab PO daily days 2-5 (Patient not taking: Reported on 01/16/2022)   [DISCONTINUED] benzonatate (TESSALON) 100 MG capsule Take 1 capsule (100 mg total) by mouth every 8 (eight) hours as needed for cough.   [DISCONTINUED] DULoxetine (CYMBALTA) 60 MG capsule Take 60 mg by mouth daily. (Patient not taking: Reported on 01/16/2022)   [DISCONTINUED] fluticasone (FLONASE) 50 MCG/ACT nasal spray Place 1 spray into both nostrils daily. (Patient not taking: Reported on 03/22/2022)   [DISCONTINUED] folic acid (FOLVITE) 1 MG tablet Take 2 tablets (2 mg total) by mouth daily. (Patient taking differently: Take 1 mg by mouth daily. )   [DISCONTINUED] hydrOXYzine (ATARAX) 25 MG tablet Take 25 mg by mouth 3 (three) times daily as needed. (Patient not taking: Reported on 03/22/2022)   [DISCONTINUED] oxycodone-acetaminophen (LYNOX) 5-300 MG tablet Take 1 tablet by mouth every 8 (eight) hours as  needed for pain.   [DISCONTINUED] pyridOXINE (B-6) 50 MG tablet Take 1 tablet (50 mg total) by mouth daily.   [DISCONTINUED] vitamin B-12 (CYANOCOBALAMIN) 1000 MCG tablet Take 1 tablet (1,000 mcg total) by mouth daily.   No facility-administered encounter medications on file as of 03/22/2022.    Past Medical History:  Diagnosis Date   Bipolar 1 disorder ([place])    Complication of anesthesia    Depression    Hypertension    Low vitamin B12 level 04/11/2019   PONV (postoperative nausea and vomiting)    Stroke [facility])     Past Surgical History:  Procedure Laterality Date   CESAREAN SECTION     X2   HERNIA REPAIR     TEE WITHOUT CARDIOVERSION N/A 11/13/2017   Procedure: TRANSESOPHAGEAL ECHOCARDIOGRAM (TEE) bubble study;  Surgeon: Skeet Latch, MD;  Location: [place];  Service: Cardiovascular;  Laterality: N/A;    Family History  Problem Relation Age of Onset   Hypertension Mother    Stroke Father    Stroke Maternal Aunt     Social History   Socioeconomic History   Marital status: Married    Spouse name: Not on file   Number of children: Not on file   Years of education: Not on file   Highest education level: Not on file  Occupational History   Not on file  Tobacco Use   Smoking status: Never   Smokeless tobacco: Never  Vaping Use   Vaping Use: Never used  Substance and Sexual Activity   Alcohol use: Not Currently    Comment: none in 2 years -06/08/2021   Drug use: No   Sexual activity: Yes    Birth control/protection: None  Other Topics Concern   Not on file  Social History Narrative   Not on file   Social Determinants of Health   Financial Resource Strain: Not on file  Food Insecurity: Not on file  Transportation Needs: Not on file  Physical Activity: Not on file  Stress: Not on file  Social Connections: Not on file  Intimate Partner Violence: Not on file    Review of Systems  All other systems reviewed and are negative.       Objective     BP (!) 136/92   Pulse 78   Temp (!) 97.1 F (36.2 C) (Oral)   Resp 16   Ht 5' 3.5" (1.613 m)   Wt 174 lb (78.9 kg)   SpO2 99%   BMI 30.34 kg/m   Physical Exam Vitals and nursing note reviewed.  Constitutional:      General: She is not in acute distress. Cardiovascular:     Rate and Rhythm: Normal rate and regular rhythm.  Pulmonary:     Effort: Pulmonary effort is normal.     Breath sounds: Normal breath sounds.  Abdominal:     Palpations: Abdomen is soft.     Tenderness: There is no abdominal tenderness.  Neurological:     General: No focal deficit present.     Mental Status: She is alert and oriented to person, place, and time.     Motor: Weakness (left sided) present.  Psychiatric:        Mood and Affect: Mood normal.        Behavior: Behavior normal.         Assessment & Plan:   1. Essential hypertension Slightly elevated reading.   2. Iron deficiency anemia due to chronic blood loss Continue iron supplements.  3. 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 stable  4. Encounter to establish care     Return in about 3 weeks (around 04/12/2022) for physical.   Becky Sax, MD

## 2022-04-13 ENCOUNTER — Other Ambulatory Visit: Payer: Self-pay | Admitting: Family Medicine

## 2022-04-13 NOTE — Telephone Encounter (Signed)
Requested medication (s) are due for refill today - no  Requested medication (s) are on the active medication list -yes  Future visit scheduled -yes  Last refill: 03/22/22 #60 1RF  Notes to clinic: Request for 90 day supply- original Rx does no cover that amount- requires changes to original Rx.  Requested Prescriptions  Pending Prescriptions Disp Refills   carvedilol (COREG) 6.25 MG tablet [Pharmacy Med Name: CARVEDILOL 6.25 MG TABLET] 180 tablet 1    Sig: TAKE 1 TABLET BY MOUTH 2 TIMES DAILY WITH A MEAL.     Cardiovascular: Beta Blockers 3 Failed - 04/13/2022 10:30 AM      Failed - Cr in normal range and within 360 days    Creatinine  Date Value Ref Range Status  03/25/2020 0.87 0.44 - 1.00 mg/dL Final         Failed - AST in normal range and within 360 days    AST  Date Value Ref Range Status  03/25/2020 12 (L) 15 - 41 U/L Final         Failed - ALT in normal range and within 360 days    ALT  Date Value Ref Range Status  03/25/2020 9 0 - 44 U/L Final         Failed - Last BP in normal range    BP Readings from Last 1 Encounters:  03/22/22 (!) 136/92         Passed - Last Heart Rate in normal range    Pulse Readings from Last 1 Encounters:  03/22/22 78         Passed - Valid encounter within last 6 months    Recent Outpatient Visits           3 weeks ago Essential hypertension   [facility], MD       Future Appointments             In 1 week Dorna Mai, MD [facility]               Requested Prescriptions  Pending Prescriptions Disp Refills   carvedilol (COREG) 6.25 MG tablet [Pharmacy Med Name: CARVEDILOL 6.25 MG TABLET] 180 tablet 1    Sig: TAKE 1 TABLET BY MOUTH 2 TIMES DAILY WITH A MEAL.     Cardiovascular: Beta Blockers 3 Failed - 04/13/2022 10:30 AM      Failed - Cr in normal range and within 360 days    Creatinine  Date Value Ref Range Status  03/25/2020  0.87 0.44 - 1.00 mg/dL Final         Failed - AST in normal range and within 360 days    AST  Date Value Ref Range Status  03/25/2020 12 (L) 15 - 41 U/L Final         Failed - ALT in normal range and within 360 days    ALT  Date Value Ref Range Status  03/25/2020 9 0 - 44 U/L Final         Failed - Last BP in normal range    BP Readings from Last 1 Encounters:  03/22/22 (!) 136/92         Passed - Last Heart Rate in normal range    Pulse Readings from Last 1 Encounters:  03/22/22 78         Passed - Valid encounter within last 6 months    Recent Outpatient  Visits           3 weeks ago Essential hypertension   [facility], MD       Future Appointments             In 1 week Dorna Mai, MD [facility]

## 2022-04-26 ENCOUNTER — Ambulatory Visit (INDEPENDENT_AMBULATORY_CARE_PROVIDER_SITE_OTHER): Payer: Medicaid Other | Admitting: Family Medicine

## 2022-04-26 ENCOUNTER — Encounter: Payer: Self-pay | Admitting: Family Medicine

## 2022-04-26 VITALS — BP 131/88 | HR 82 | Temp 98.1°F | Resp 16 | Ht 63.0 in | Wt 168.6 lb

## 2022-04-26 DIAGNOSIS — Z130 Encounter for screening for diseases of the blood and blood-forming organs and certain disorders involving the immune mechanism: Secondary | ICD-10-CM

## 2022-04-26 DIAGNOSIS — Z1159 Encounter for screening for other viral diseases: Secondary | ICD-10-CM | POA: Diagnosis not present

## 2022-04-26 DIAGNOSIS — Z0000 Encounter for general adult medical examination without abnormal findings: Secondary | ICD-10-CM | POA: Diagnosis not present

## 2022-04-26 DIAGNOSIS — Z1231 Encounter for screening mammogram for malignant neoplasm of breast: Secondary | ICD-10-CM

## 2022-04-26 DIAGNOSIS — Z13228 Encounter for screening for other metabolic disorders: Secondary | ICD-10-CM | POA: Diagnosis not present

## 2022-04-26 DIAGNOSIS — Z13220 Encounter for screening for lipoid disorders: Secondary | ICD-10-CM | POA: Diagnosis not present

## 2022-04-26 DIAGNOSIS — Z1329 Encounter for screening for other suspected endocrine disorder: Secondary | ICD-10-CM

## 2022-04-26 DIAGNOSIS — Z1211 Encounter for screening for malignant neoplasm of colon: Secondary | ICD-10-CM

## 2022-04-26 MED ORDER — CARVEDILOL 6.25 MG PO TABS: 6.2500 mg | ORAL_TABLET | Freq: Two times a day (BID) | ORAL | 1 refills | Status: DC

## 2022-04-27 ENCOUNTER — Encounter: Payer: Self-pay | Admitting: Family Medicine

## 2022-04-27 LAB — CBC WITH DIFFERENTIAL/PLATELET
Basophils Absolute: 0.1 10*3/uL (ref 0.0–0.2)
Basos: 2 %
EOS (ABSOLUTE): 0 10*3/uL (ref 0.0–0.4)
Eos: 1 %
Hematocrit: 29.6 % — ABNORMAL LOW (ref 34.0–46.6)
Hemoglobin: 8 g/dL — ABNORMAL LOW (ref 11.1–15.9)
Immature Grans (Abs): 0 10*3/uL (ref 0.0–0.1)
Immature Granulocytes: 0 %
Lymphocytes Absolute: 0.9 10*3/uL (ref 0.7–3.1)
Lymphs: 21 %
MCH: 19.2 pg — ABNORMAL LOW (ref 26.6–33.0)
MCHC: 27 g/dL — ABNORMAL LOW (ref 31.5–35.7)
MCV: 71 fL — ABNORMAL LOW (ref 79–97)
Monocytes Absolute: 0.4 10*3/uL (ref 0.1–0.9)
Monocytes: 8 %
Neutrophils Absolute: 3 10*3/uL (ref 1.4–7.0)
Neutrophils: 68 %
Platelets: 398 10*3/uL (ref 150–450)
RBC: 4.17 x10E6/uL (ref 3.77–5.28)
RDW: 19.9 % — ABNORMAL HIGH (ref 11.7–15.4)
WBC: 4.4 10*3/uL (ref 3.4–10.8)

## 2022-04-27 LAB — VITAMIN D 25 HYDROXY (VIT D DEFICIENCY, FRACTURES): Vit D, 25-Hydroxy: 23.7 ng/mL — ABNORMAL LOW (ref 30.0–100.0)

## 2022-04-27 LAB — CMP14+EGFR
ALT: 8 IU/L (ref 0–32)
AST: 13 IU/L (ref 0–40)
Albumin/Globulin Ratio: 1.8 (ref 1.2–2.2)
Albumin: 4.8 g/dL (ref 3.9–4.9)
Alkaline Phosphatase: 48 IU/L (ref 44–121)
BUN/Creatinine Ratio: 4 — ABNORMAL LOW (ref 9–23)
BUN: 4 mg/dL — ABNORMAL LOW (ref 6–24)
Bilirubin Total: 0.3 mg/dL (ref 0.0–1.2)
CO2: 18 mmol/L — ABNORMAL LOW (ref 20–29)
Calcium: 9.7 mg/dL (ref 8.7–10.2)
Chloride: 101 mmol/L (ref 96–106)
Creatinine, Ser: 0.9 mg/dL (ref 0.57–1.00)
Globulin, Total: 2.6 g/dL (ref 1.5–4.5)
Glucose: 85 mg/dL (ref 70–99)
Potassium: 4.4 mmol/L (ref 3.5–5.2)
Sodium: 139 mmol/L (ref 134–144)
Total Protein: 7.4 g/dL (ref 6.0–8.5)
eGFR: 79 mL/min/{1.73_m2} (ref >59)

## 2022-04-27 LAB — HEPATITIS C ANTIBODY: Hep C Virus Ab: NONREACTIVE

## 2022-04-27 LAB — TSH: TSH: 1.44 u[IU]/mL (ref 0.450–4.500)

## 2022-04-27 LAB — LIPID PANEL
Chol/HDL Ratio: 2.2 ratio (ref 0.0–4.4)
Cholesterol, Total: 113 mg/dL (ref 100–199)
HDL: 51 mg/dL (ref >39)
LDL Chol Calc (NIH): 49 mg/dL (ref 0–99)
Triglycerides: 58 mg/dL (ref 0–149)
VLDL Cholesterol Cal: 13 mg/dL (ref 5–40)

## 2022-04-27 NOTE — Progress Notes (Unsigned)
Established Patient Office Visit  Subjective    Patient ID: Mallory Robinson, female    DOB: 03-02-1975  Age: 47 y.o. MRN: [id number]  CC:  Chief Complaint  Patient presents with   Annual Exam    HPI Mallory Robinson presents for routine annual exam. Patient denies acute complaints.    Outpatient Encounter Medications as of 04/26/2022  Medication Sig   aspirin EC 81 MG tablet Take 81 mg by mouth daily.   ferrous sulfate 325 (65 FE) MG tablet Take 1 tablet (325 mg total) by mouth 2 (two) times daily with a meal.   [DISCONTINUED] carvedilol (COREG) 6.25 MG tablet Take 1 tablet (6.25 mg total) by mouth 2 (two) times daily with a meal.   carvedilol (COREG) 6.25 MG tablet Take 1 tablet (6.25 mg total) by mouth 2 (two) times daily with a meal.   [DISCONTINUED] propranolol (INDERAL) 20 MG tablet Take 1 tablet (20 mg total) by mouth 2 (two) times daily.   No facility-administered encounter medications on file as of 04/26/2022.    Past Medical History:  Diagnosis Date   Bipolar 1 disorder ([place])    Complication of anesthesia    Depression    Hypertension    Low vitamin B12 level 04/11/2019   PONV (postoperative nausea and vomiting)    Stroke [facility])     Past Surgical History:  Procedure Laterality Date   CESAREAN SECTION     X2   HERNIA REPAIR     TEE WITHOUT CARDIOVERSION N/A 11/13/2017   Procedure: TRANSESOPHAGEAL ECHOCARDIOGRAM (TEE) bubble study;  Surgeon: Skeet Latch, MD;  Location: [place];  Service: Cardiovascular;  Laterality: N/A;    Family History  Problem Relation Age of Onset   Hypertension Mother    Stroke Father    Stroke Maternal Aunt     Social History   Socioeconomic History   Marital status: Married    Spouse name: Not on file   Number of children: Not on file   Years of education: Not on file   Highest education level: Not on file  Occupational History   Not on file  Tobacco Use   Smoking status: Never   Smokeless tobacco: Never  Vaping Use    Vaping Use: Never used  Substance and Sexual Activity   Alcohol use: Not Currently    Comment: none in 2 years -06/08/2021   Drug use: No   Sexual activity: Yes    Birth control/protection: None  Other Topics Concern   Not on file  Social History Narrative   Not on file   Social Determinants of Health   Financial Resource Strain: Not on file  Food Insecurity: Not on file  Transportation Needs: Not on file  Physical Activity: Not on file  Stress: Not on file  Social Connections: Not on file  Intimate Partner Violence: Not on file    Review of Systems  All other systems reviewed and are negative.       Objective    BP 131/88   Pulse 82   Temp 98.1 F (36.7 C) (Oral)   Resp 16   Ht '5\' 3"'$  (1.6 m)   Wt 168 lb 9.6 oz (76.5 kg)   SpO2 98%   BMI 29.87 kg/m   Physical Exam Vitals and nursing note reviewed.  Constitutional:      General: She is not in acute distress. HENT:     Head: Normocephalic and atraumatic.     Right Ear: Tympanic  membrane, ear canal and external ear normal.     Left Ear: Tympanic membrane, ear canal and external ear normal.     Nose: Nose normal.     Mouth/Throat:     Mouth: Mucous membranes are moist.     Pharynx: Oropharynx is clear.  Eyes:     Conjunctiva/sclera: Conjunctivae normal.     Pupils: Pupils are equal, round, and reactive to light.  Neck:     Thyroid: No thyromegaly.  Cardiovascular:     Rate and Rhythm: Normal rate and regular rhythm.     Heart sounds: Normal heart sounds. No murmur heard. Pulmonary:     Effort: Pulmonary effort is normal. No respiratory distress.     Breath sounds: Normal breath sounds.  Abdominal:     General: There is no distension.     Palpations: Abdomen is soft. There is no mass.     Tenderness: There is no abdominal tenderness.  Musculoskeletal:        General: Normal range of motion.     Cervical back: Normal range of motion and neck supple.  Skin:    General: Skin is warm and dry.   Neurological:     General: No focal deficit present.     Mental Status: She is alert and oriented to person, place, and time.  Psychiatric:        Mood and Affect: Mood normal.        Behavior: Behavior normal.     {Labs (Optional):23779}    Assessment & Plan:   1. Annual physical exam  - CMP14+EGFR  2. Screening for deficiency anemia  - CBC with Differential  3. Screening for lipid disorders  - Lipid Panel  4. Screening for endocrine/metabolic/immunity disorders  - TSH - Vitamin D, 25-hydroxy  5. Encounter for screening mammogram for malignant neoplasm of breast  - MM Digital Screening; Future  6. Screening for colon cancer  - Cologuard  7. Need for hepatitis C screening test  - Hepatitis C antibody  No follow-ups on file.   Becky Sax, MD

## 2022-05-01 ENCOUNTER — Other Ambulatory Visit: Payer: Self-pay | Admitting: Family Medicine

## 2022-05-01 MED ORDER — VITAMIN D (ERGOCALCIFEROL) 1.25 MG (50000 UNIT) PO CAPS: 50000.0000 [IU] | ORAL_CAPSULE | ORAL | 0 refills | Status: DC

## 2022-05-01 MED ORDER — FERROUS SULFATE 325 (65 FE) MG PO TABS: 325.0000 mg | ORAL_TABLET | Freq: Two times a day (BID) | ORAL | 5 refills | Status: AC

## 2022-05-25 ENCOUNTER — Other Ambulatory Visit: Payer: Self-pay

## 2022-05-25 ENCOUNTER — Encounter (HOSPITAL_COMMUNITY): Payer: Self-pay | Admitting: Emergency Medicine

## 2022-05-25 ENCOUNTER — Emergency Department (HOSPITAL_COMMUNITY)
Admission: EM | Admit: 2022-05-25 | Discharge: 2022-05-25 | Discharge status: Against Medical Advice | Payer: Medicaid Other | Attending: Emergency Medicine | Admitting: Emergency Medicine

## 2022-05-25 ENCOUNTER — Ambulatory Visit
Admission: EM | Admit: 2022-05-25 | Discharge: 2022-05-25 | Disposition: A | Discharge status: Home / Self Care | Payer: Medicaid Other

## 2022-05-25 DIAGNOSIS — Z7982 Long term (current) use of aspirin: Secondary | ICD-10-CM | POA: Insufficient documentation

## 2022-05-25 DIAGNOSIS — H538 Other visual disturbances: Secondary | ICD-10-CM | POA: Diagnosis not present

## 2022-05-25 DIAGNOSIS — H539 Unspecified visual disturbance: Secondary | ICD-10-CM

## 2022-05-25 DIAGNOSIS — Z5329 Procedure and treatment not carried out because of patient's decision for other reasons: Secondary | ICD-10-CM | POA: Insufficient documentation

## 2022-05-25 DIAGNOSIS — Z8673 Personal history of transient ischemic attack (TIA), and cerebral infarction without residual deficits: Secondary | ICD-10-CM | POA: Insufficient documentation

## 2022-05-25 NOTE — Discharge Instructions (Signed)
Advised to report to the emergency room for higher level of care due to right sided vision loss, vision changes and patient having a prior history of 2 strokes recently on the right side, 3 and 4 years ago.

## 2022-05-25 NOTE — ED Provider Notes (Signed)
[facility]    CSN: [id number] Arrival date & time: 05/25/22  0947      History   Chief Complaint Chief Complaint  Patient presents with   Eye Problem    HPI Mallory Robinson is a 47 y.o. female.   47 year old female presents with vision changes.  Patient indicates that she has had a stroke 4 years ago which she received tPA that was right-sided, and then again she had a stroke 3 years ago right side which she again received tPA.  Patient indicates that she has been doing well.  She indicates that she did have  right eye vision changes when she had her stroke before.  Patient indicates that when she had a stroke before she had right eye vision changes, she was seeing "prism in her right vision that would last for a point of time and then resolved but she also had other symptoms when she has her stroke.  She indicates today this morning at 9:00 AM she had right-sided vision changes which she describes as a prism, varying colors that were surrounding her normal vision but it was like she had tunnel vision right eye only, and then on the outside was the prism colors.  She indicates that this lasted for about 20 minutes and then she saw white light and then it resolved.  She indicates that after short period of time the prism returned in right eye with similar symptoms lasting another 20 minutes until it resolved.  Patient relates this is when she presented to the office.  She indicates that she is not having any headaches, dizziness, numbness, tingling, or weakness of the upper or lower extremities.  She indicates that her blood pressure medicine was changed a month ago to Coreg.  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  She is concerned about the right-sided vision changes without any other symptoms occurring at the present time.   Eye Problem   Past Medical History:  Diagnosis Date   Bipolar 1 disorder     Complication of anesthesia    Depression    Hypertension    Low vitamin B12 level 04/11/2019   PONV (postoperative nausea and vomiting)    Stroke     Patient Active Problem List   Diagnosis Date Noted   Iron deficiency anemia due to chronic blood loss 04/01/2020   OSA (obstructive sleep apnea): Probable 04/12/2019   Folate deficiency 04/11/2019   Low vitamin B12 level 04/11/2019   Stroke-like episode 04/10/2019   Acute nonintractable headache    Left-sided weakness    Acute ischemic right MCA stroke 04/09/2019   Microcytic anemia 04/09/2019   Thrombocytosis 04/09/2019   Chronic migraine 04/09/2019   Hypertension 04/09/2019   Acute ischemic stroke 12/27/2018   Cerebral embolism with cerebral infarction 11/11/2017   Acute CVA (cerebrovascular accident) 11/11/2017   Neurological deficit present 11/10/2017   Bipolar I disorder ([facility]) 03/05/2013   PTSD (post-traumatic stress disorder) 03/05/2013    Past Surgical History:  Procedure Laterality Date   CESAREAN SECTION     X2   HERNIA REPAIR     TEE WITHOUT CARDIOVERSION N/A 11/13/2017   Procedure: TRANSESOPHAGEAL ECHOCARDIOGRAM (TEE) bubble study;  Surgeon: Chilton Si, MD;  [facility];  Service: Cardiovascular;  Laterality: N/A;    OB History   No obstetric history on file.      Home Medications    Prior to  Admission medications   Medication Sig Start Date End Date Taking? Authorizing Provider  aspirin EC 81 MG tablet Take 81 mg by mouth daily.    [provider]  carvedilol (COREG) 6.25 MG tablet Take 1 tablet (6.25 mg total) by mouth 2 (two) times daily with a meal. 04/26/22   Georganna SkeansWilson, Amelia, MD  ferrous sulfate 325 (65 FE) MG tablet Take 1 tablet (325 mg total) by mouth 2 (two) times daily with a meal. 05/01/22   Georganna SkeansWilson, Amelia, MD  Vitamin D, Ergocalciferol, (DRISDOL) 1.25 MG (50000 UNIT) CAPS capsule Take 1 capsule (50,000 Units total) by mouth every 7 (seven) days. 05/01/22   Georganna SkeansWilson, Amelia, MD     Family History Family History  Problem Relation Age of Onset   Hypertension Mother    Stroke Father    Stroke Maternal Aunt     Social History Social History   Tobacco Use   Smoking status: Never   Smokeless tobacco: Never  Vaping Use   Vaping Use: Never used  Substance Use Topics   Alcohol use: Not Currently    Comment: none in 2 years -06/08/2021   Drug use: No     Allergies   Food and Penicillins   Review of Systems Review of Systems  Eyes:  Positive for visual disturbance (right eye vision changes, seeing prism).     Physical Exam Triage Vital Signs ED Triage Vitals  Enc Vitals Group     BP 05/25/22 1004 (!) 168/96     Pulse Rate 05/25/22 1004 74     Resp 05/25/22 1004 18     Temp 05/25/22 1004 98.3 F (36.8 C)     Temp Source 05/25/22 1004 Oral     SpO2 05/25/22 1004 96 %     Weight --      Height --      Head Circumference --      Peak Flow --      Pain Score 05/25/22 1005 0     Pain Loc --      Pain Edu? --      Excl. in GC? --    No data found.  Updated Vital Signs BP (!) 168/96 (BP Location: Left Arm)   Pulse 74   Temp 98.3 F (36.8 C) (Oral)   Resp 18   LMP 05/03/2022 (Within Days)   SpO2 96%   Visual Acuity Right Eye Distance:   Left Eye Distance:   Bilateral Distance:    Right Eye Near:   Left Eye Near:    Bilateral Near:     Physical Exam Constitutional:      Appearance: Normal appearance.  HENT:     Right Ear: Tympanic membrane and ear canal normal.     Left Ear: Tympanic membrane and ear canal normal.     Mouth/Throat:     Mouth: Mucous membranes are moist.     Pharynx: Oropharynx is clear. Uvula midline.  Eyes:     General: Lids are normal.     Extraocular Movements: Extraocular movements intact.     Conjunctiva/sclera: Conjunctivae normal.  Cardiovascular:     Rate and Rhythm: Normal rate and regular rhythm.     Heart sounds: Normal heart sounds.  Pulmonary:     Effort: Pulmonary effort is normal.      Breath sounds: Normal breath sounds and air entry. No wheezing, rhonchi or rales.  Lymphadenopathy:     Cervical: No cervical adenopathy.  Neurological:     Mental  Status: She is alert and oriented to person, place, and time.     Cranial Nerves: Cranial nerves 2-12 are intact.     Motor: Motor function is intact.     Coordination: Coordination is intact.      UC Treatments / Results  Labs (all labs ordered are listed, but only abnormal results are displayed) Labs Reviewed - No data to display  EKG   Radiology No results found.  Procedures Procedures (including critical care time)  Medications Ordered in UC Medications - No data to display  Initial Impression / Assessment and Plan / UC Course  I have reviewed the triage vital signs and the nursing notes.  Pertinent labs & imaging results that were available during my care of the patient were reviewed by me and considered in my medical decision making (see chart for details).    Plan: The diagnosis will be treated with the following: 1.  Vision changes: A.  Advised to report to the emergency room for higher level of care due to right vision changes and prior history of stroke. 2.  Blurred vision right eye: A.  Advised to report to the emergency room for higher level of care due to right vision changes and prior history of stroke x 2. 3.  Advised follow-up PCP return to urgent care as needed. Final Clinical Impressions(s) / UC Diagnoses   Final diagnoses:  Vision changes  Blurred vision, right eye     Discharge Instructions      Advised to report to the emergency room for higher level of care due to right sided vision loss, vision changes and patient having a prior history of 2 strokes recently on the right side, 3 and 4 years ago.    ED Prescriptions   None    PDMP not reviewed this encounter.   Ellsworth LennoxJames, Addi Pak, PA-C 05/25/22 1032

## 2022-05-25 NOTE — ED Notes (Signed)
Pt reports needs to leave. Providers and CN notified. AMA form signed. Pt alert, NAD, calm, interactive, steady gait. No eye guarding. States will f/u with PCP. Encouraged to stay, as well as return if changes mind, worsens, develops new sx. Pt agreeable.

## 2022-05-25 NOTE — ED Triage Notes (Signed)
Pt c/o right eye, pt has hx of stroke and states that her eye is creating a prism, only in the right eye. Pt denies headache, or stroke like sx currently. Pt describes as straining with new glasses

## 2022-05-25 NOTE — ED Provider Notes (Signed)
  [place] EM[facility] DE[facility]MENT AT [facility] Provider Note   CSN: [id number] Arrival date & time: 05/25/22  1109     History  Chief Complaint  Patient presents with   Eye Problem    Mallory Robinson is a 47 y.o. female.  Patient sent in from urgent care for concerns for possible stroke patient has a history of strokes.  Patient was at work at around 9 this morning and started having some blocking of her vision in the right eye.  Currently it is now all normal.  No other neurodeficits.  Patient was here for under 2 hours total time and ended up leaving without being seen.  Clinically without seeing the patient sounds that could be concerning for possible TIA.       Home Medications Prior to Admission medications   Medication Sig Start Date End Date Taking? Authorizing Provider  aspirin EC 81 MG tablet Take 81 mg by mouth daily.    [provider]  carvedilol (COREG) 6.25 MG tablet Take 1 tablet (6.25 mg total) by mouth 2 (two) times daily with a meal. 04/26/22   Georganna Skeans, MD  ferrous sulfate 325 (65 FE) MG tablet Take 1 tablet (325 mg total) by mouth 2 (two) times daily with a meal. 05/01/22   Georganna Skeans, MD  Vitamin D, Ergocalciferol, (DRISDOL) 1.25 MG (50000 UNIT) CAPS capsule Take 1 capsule (50,000 Units total) by mouth every 7 (seven) days. 05/01/22   Georganna Skeans, MD      Allergies    Food and Penicillins    Review of Systems   Review of Systems  Physical Exam Updated Vital Signs BP (!) 159/103 (BP Location: Right Arm)   Pulse 77   Temp 98.3 F (36.8 C)   Resp 16   Ht 1.6 m (5\' 3" )   Wt 75.8 kg   LMP 05/03/2022 (Within Days)   SpO2 100%   BMI 29.58 kg/m  Physical Exam  ED Results / Procedures / Treatments   Labs (all labs ordered are listed, but only abnormal results are displayed) Labs Reviewed - No data to display  EKG None  Radiology No results found.  Procedures Procedures    Medications Ordered in ED Medications -  No data to display  ED Course/ Medical Decision Making/ A&P                             Medical Decision Making   Final Clinical Impression(s) / ED Diagnoses Final diagnoses:  Changes in vision    Rx / DC Orders ED Discharge Orders     None         Vanetta Mulders, MD 05/25/22 1322

## 2022-05-25 NOTE — ED Triage Notes (Signed)
Patient arrives ambulatory by POV c/o right eye issues. Patient states while sitting at work around 9am this morning having a prism in her right eye blocking her vision. Reports hx of stroke. Vision currently normal. No neuro deficits noted in triage.

## 2022-05-28 ENCOUNTER — Ambulatory Visit: Payer: Medicaid Other | Admitting: Family Medicine

## 2022-05-28 VITALS — BP 143/94 | HR 78 | Temp 98.1°F | Resp 16 | Wt 170.8 lb

## 2022-05-28 DIAGNOSIS — F411 Generalized anxiety disorder: Secondary | ICD-10-CM

## 2022-05-28 DIAGNOSIS — Z8673 Personal history of transient ischemic attack (TIA), and cerebral infarction without residual deficits: Secondary | ICD-10-CM | POA: Diagnosis not present

## 2022-05-28 DIAGNOSIS — H5310 Unspecified subjective visual disturbances: Secondary | ICD-10-CM

## 2022-05-28 NOTE — Progress Notes (Unsigned)
Patiet is here with concerns of right eye blindness. Patient went to the ER and LWBS. Patient has had a previous stroke and is concern.

## 2022-05-29 NOTE — Progress Notes (Unsigned)
New Patient Office Visit  Subjective    Patient ID: Mallory Robinson, female    DOB: Jun 29, 1975  Age: 47 y.o. MRN: [id number]  CC: No chief complaint on file.   HPI Mallory Robinson presents to establish care ***  Outpatient Encounter Medications as of 05/28/2022  Medication Sig   aspirin EC 81 MG tablet Take 81 mg by mouth daily.   carvedilol (COREG) 6.25 MG tablet Take 1 tablet (6.25 mg total) by mouth 2 (two) times daily with a meal.   ferrous sulfate 325 (65 FE) MG tablet Take 1 tablet (325 mg total) by mouth 2 (two) times daily with a meal.   Vitamin D, Ergocalciferol, (DRISDOL) 1.25 MG (50000 UNIT) CAPS capsule Take 1 capsule (50,000 Units total) by mouth every 7 (seven) days.   No facility-administered encounter medications on file as of 05/28/2022.    Past Medical History:  Diagnosis Date   Bipolar 1 disorder    Complication of anesthesia    Depression    Hypertension    Low vitamin B12 level 04/11/2019   PONV (postoperative nausea and vomiting)    Stroke     Past Surgical History:  Procedure Laterality Date   CESAREAN SECTION     X2   HERNIA REPAIR     TEE WITHOUT CARDIOVERSION N/A 11/13/2017   Procedure: TRANSESOPHAGEAL ECHOCARDIOGRAM (TEE) bubble study;  Surgeon: Chilton Si, MD;  Location: [facility] ENDOSCOPY;  Service: Cardiovascular;  Laterality: N/A;    Family History  Problem Relation Age of Onset   Hypertension Mother    Stroke Father    Stroke Maternal Aunt     Social History   Socioeconomic History   Marital status: Married    Spouse name: Not on file   Number of children: Not on file   Years of education: Not on file   Highest education level: Some college, no degree  Occupational History   Not on file  Tobacco Use   Smoking status: Never   Smokeless tobacco: Never  Vaping Use   Vaping Use: Never used  Substance and Sexual Activity   Alcohol use: Not Currently    Comment: none in 2 years -06/08/2021   Drug use: No   Sexual activity: Yes     Birth control/protection: None  Other Topics Concern   Not on file  Social History Narrative   Not on file   Social Determinants of Health   Financial Resource Strain: Medium Risk (05/28/2022)   Overall Financial Resource Strain (CARDIA)    Difficulty of Paying Living Expenses: Somewhat hard  Food Insecurity: Food Insecurity Present (05/28/2022)   Hunger Vital Sign    Worried About Running Out of Food in the Last Year: Sometimes true    Ran Out of Food in the Last Year: Sometimes true  Transportation Needs: No Transportation Needs (05/28/2022)   PRAPARE - Administrator, Civil Service (Medical): No    Lack of Transportation (Non-Medical): No  Physical Activity: Unknown (05/28/2022)   Exercise Vital Sign    Days of Exercise per Week: 0 days    Minutes of Exercise per Session: Not on file  Stress: Stress Concern Present (05/28/2022)   [employer] of Occupational Health - Occupational Stress Questionnaire    Feeling of Stress : To some extent  Social Connections: Socially Isolated (05/28/2022)   Social Connection and Isolation Panel [NHANES]    Frequency of Communication with Friends and Family: Twice a week    Frequency of  Social Gatherings with Friends and Family: Never    Attends Religious Services: Never    Diplomatic Services operational officer: No    Attends Engineer, structural: Not on file    Marital Status: Married  Catering manager Violence: Not on file    ROS      Objective    BP (!) 143/94   Pulse 78   Temp 98.1 F (36.7 C) (Oral)   Resp 16   Wt 170 lb 12.8 oz (77.5 kg)   LMP 05/03/2022 (Within Days)   SpO2 99%   BMI 30.26 kg/m   Physical Exam  {Labs (Optional):23779}    Assessment & Plan:   Problem List Items Addressed This Visit   None Visit Diagnoses     Subjective visual disturbance of right eye    -  Primary   Relevant Orders   Ambulatory referral to Ophthalmology       No follow-ups on file.   Tommie Raymond,  MD

## 2022-05-30 ENCOUNTER — Encounter: Payer: Self-pay | Admitting: Family Medicine

## 2022-05-31 NOTE — Telephone Encounter (Signed)
Patient advice

## 2022-06-01 ENCOUNTER — Other Ambulatory Visit: Payer: Self-pay | Admitting: Family Medicine

## 2022-06-01 DIAGNOSIS — R29818 Other symptoms and signs involving the nervous system: Secondary | ICD-10-CM

## 2022-06-01 DIAGNOSIS — R531 Weakness: Secondary | ICD-10-CM

## 2022-06-01 DIAGNOSIS — Z8673 Personal history of transient ischemic attack (TIA), and cerebral infarction without residual deficits: Secondary | ICD-10-CM

## 2022-06-12 ENCOUNTER — Emergency Department (HOSPITAL_BASED_OUTPATIENT_CLINIC_OR_DEPARTMENT_OTHER)
Admission: EM | Admit: 2022-06-12 | Discharge: 2022-06-12 | Disposition: A | Discharge status: Home / Self Care | Payer: Medicaid Other | Attending: Emergency Medicine | Admitting: Emergency Medicine

## 2022-06-12 ENCOUNTER — Other Ambulatory Visit: Payer: Self-pay

## 2022-06-12 ENCOUNTER — Encounter (HOSPITAL_BASED_OUTPATIENT_CLINIC_OR_DEPARTMENT_OTHER): Payer: Self-pay

## 2022-06-12 DIAGNOSIS — I10 Essential (primary) hypertension: Secondary | ICD-10-CM | POA: Insufficient documentation

## 2022-06-12 DIAGNOSIS — Z79899 Other long term (current) drug therapy: Secondary | ICD-10-CM | POA: Diagnosis not present

## 2022-06-12 DIAGNOSIS — Y92410 Unspecified street and highway as the place of occurrence of the external cause: Secondary | ICD-10-CM | POA: Insufficient documentation

## 2022-06-12 DIAGNOSIS — S8011XA Contusion of right lower leg, initial encounter: Secondary | ICD-10-CM | POA: Insufficient documentation

## 2022-06-12 DIAGNOSIS — M5459 Other low back pain: Secondary | ICD-10-CM | POA: Diagnosis not present

## 2022-06-12 DIAGNOSIS — S8012XA Contusion of left lower leg, initial encounter: Secondary | ICD-10-CM | POA: Insufficient documentation

## 2022-06-12 DIAGNOSIS — Z8673 Personal history of transient ischemic attack (TIA), and cerebral infarction without residual deficits: Secondary | ICD-10-CM | POA: Insufficient documentation

## 2022-06-12 DIAGNOSIS — M545 Low back pain, unspecified: Secondary | ICD-10-CM | POA: Insufficient documentation

## 2022-06-12 DIAGNOSIS — Z7982 Long term (current) use of aspirin: Secondary | ICD-10-CM | POA: Insufficient documentation

## 2022-06-12 MED ORDER — LIDOCAINE 5 % EX PTCH
2.0000 | MEDICATED_PATCH | CUTANEOUS | Status: DC
  Administered 2022-06-12: 2 via TRANSDERMAL
  Filled 2022-06-12: qty 2
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
## 2022-06-12 NOTE — ED Notes (Signed)
Discharge instructions provided by edp were reinforced to pt. Pt verbalized understanding with no additional questions at this time. Pt to go home with family at bedside . Ambulatory, gait stable at discharge.
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
## 2022-06-14 ENCOUNTER — Emergency Department (HOSPITAL_BASED_OUTPATIENT_CLINIC_OR_DEPARTMENT_OTHER)
Admission: EM | Admit: 2022-06-14 | Discharge: 2022-06-14 | Disposition: A | Discharge status: Home / Self Care | Payer: Medicaid Other | Attending: Emergency Medicine | Admitting: Emergency Medicine

## 2022-06-14 ENCOUNTER — Telehealth (HOSPITAL_BASED_OUTPATIENT_CLINIC_OR_DEPARTMENT_OTHER): Payer: Self-pay | Admitting: Emergency Medicine

## 2022-06-14 ENCOUNTER — Encounter: Payer: Self-pay | Admitting: Hematology and Oncology

## 2022-06-14 ENCOUNTER — Emergency Department (HOSPITAL_BASED_OUTPATIENT_CLINIC_OR_DEPARTMENT_OTHER): Payer: Medicaid Other

## 2022-06-14 ENCOUNTER — Other Ambulatory Visit: Payer: Self-pay

## 2022-06-14 ENCOUNTER — Encounter (HOSPITAL_BASED_OUTPATIENT_CLINIC_OR_DEPARTMENT_OTHER): Payer: Self-pay | Admitting: Emergency Medicine

## 2022-06-14 DIAGNOSIS — D72819 Decreased white blood cell count, unspecified: Secondary | ICD-10-CM | POA: Diagnosis not present

## 2022-06-14 DIAGNOSIS — Y92410 Unspecified street and highway as the place of occurrence of the external cause: Secondary | ICD-10-CM | POA: Insufficient documentation

## 2022-06-14 DIAGNOSIS — D649 Anemia, unspecified: Secondary | ICD-10-CM | POA: Insufficient documentation

## 2022-06-14 DIAGNOSIS — Z7982 Long term (current) use of aspirin: Secondary | ICD-10-CM | POA: Diagnosis not present

## 2022-06-14 DIAGNOSIS — R1013 Epigastric pain: Secondary | ICD-10-CM | POA: Diagnosis not present

## 2022-06-14 LAB — BASIC METABOLIC PANEL
Anion gap: 8 (ref 5–15)
BUN: 10 mg/dL (ref 6–20)
CO2: 24 mmol/L (ref 22–32)
Calcium: 8.9 mg/dL (ref 8.9–10.3)
Chloride: 106 mmol/L (ref 98–111)
Creatinine, Ser: 0.75 mg/dL (ref 0.44–1.00)
GFR, Estimated: >60 mL/min (ref >60)
Glucose, Bld: 88 mg/dL (ref 70–99)
Potassium: 3.7 mmol/L (ref 3.5–5.1)
Sodium: 138 mmol/L (ref 135–145)

## 2022-06-14 LAB — LIPASE, BLOOD: Lipase: 28 U/L (ref 11–51)

## 2022-06-14 LAB — HEPATIC FUNCTION PANEL
ALT: 7 U/L (ref 0–44)
AST: 11 U/L — ABNORMAL LOW (ref 15–41)
Albumin: 4.1 g/dL (ref 3.5–5.0)
Alkaline Phosphatase: 32 U/L — ABNORMAL LOW (ref 38–126)
Bilirubin, Direct: 0.1 mg/dL (ref 0.0–0.2)
Indirect Bilirubin: 0.2 mg/dL — ABNORMAL LOW (ref 0.3–0.9)
Total Bilirubin: 0.3 mg/dL (ref 0.3–1.2)
Total Protein: 6.7 g/dL (ref 6.5–8.1)

## 2022-06-14 LAB — CBC
HCT: 24.9 % — ABNORMAL LOW (ref 36.0–46.0)
Hemoglobin: 7.2 g/dL — ABNORMAL LOW (ref 12.0–15.0)
MCH: 19.1 pg — ABNORMAL LOW (ref 26.0–34.0)
MCHC: 28.9 g/dL — ABNORMAL LOW (ref 30.0–36.0)
MCV: 66 fL — ABNORMAL LOW (ref 80.0–100.0)
Platelets: 234 10*3/uL (ref 150–400)
RBC: 3.77 MIL/uL — ABNORMAL LOW (ref 3.87–5.11)
RDW: 23.3 % — ABNORMAL HIGH (ref 11.5–15.5)
WBC: 3.8 10*3/uL — ABNORMAL LOW (ref 4.0–10.5)
nRBC: 0 % (ref 0.0–0.2)

## 2022-06-14 MED ORDER — IOHEXOL 300 MG/ML  SOLN
100.0000 mL | Freq: Once | INTRAMUSCULAR | Status: AC | PRN
  Administered 2022-06-14: 85 mL via INTRAVENOUS

## 2022-06-14 NOTE — Discharge Instructions (Signed)
Your evaluated here in the emergency department with labs.  Your anemia is significant.  You do not appear to be symptomatic from it.  Please continue your iron and follow-up with your primary care doctor as scheduled. CAT scan was obtained of your chest and abdomen and shows no evidence of acute injury. Please follow-up with your primary care doctor regarding the abnormal pelvic findings and further workup that may be needed due to this. Return to the emergency department if you are worse at any time.  Please follow-up with your primary care doctor within the next week
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
## 2022-06-14 NOTE — ED Notes (Signed)
Discharge paperwork given and verbally understood. 

## 2022-10-12 ENCOUNTER — Encounter: Payer: Self-pay | Admitting: Registered Nurse

## 2022-10-12 ENCOUNTER — Telehealth: Payer: Self-pay | Admitting: Registered Nurse

## 2022-10-12 DIAGNOSIS — Z0000 Encounter for general adult medical examination without abnormal findings: Secondary | ICD-10-CM

## 2022-10-12 DIAGNOSIS — R7989 Other specified abnormal findings of blood chemistry: Secondary | ICD-10-CM

## 2022-10-12 NOTE — Telephone Encounter (Signed)
Patient contacted via telephone notified lab appt scheduled for Monday 10/15/22 at 0830 may drink black coffee or water after midnight but no solid food until after labs drawn.  May take am medications.  Epic reviewed had vitamin D level drawn already this year by [facility] Collected: 04/26/22 1045  Result status: Final  Resulting lab: [id number]  Reference range: 30.0 - 100.0 ng/mL  Value: 23.7 Low   Comment: Vitamin D deficiency has been defined by the Institute of Medicine and an Endocrine Society practice guideline as a level of serum 25-OH vitamin D less than 20 ng/mL (1,2). The Endocrine Society went on to further define vitamin D insufficiency as a level between 21 and 29 ng/mL (2). 1. IOM (Institute of Medicine). 2010. Dietary reference    intakes for calcium and D. [place]: The    [employer]. 2. Holick MF, Binkley [place], Bischoff-Ferrari HA, et al.    Evaluation, treatment, and prevention of vitamin D    deficiency: an Endocrine Society clinical practice    guideline. JCEM. 2011 Jul; 96(7):1911-30.  *Additional information available - comment, narrative, result note  Fasting executive panel and Hgba1c ordered for patient.  History of anemia on CBC last checked 06/14/22  Patient notified results will be posted to my chart next day with recommendations/findings and NP in clinic 9-12 that day if she wanted to discuss results further with provider.  RN onsite 8a-5p M-Th at work.  Patient agreed with plan of care and had no further questions at this time.

## 2022-10-15 ENCOUNTER — Other Ambulatory Visit: Payer: Self-pay

## 2022-10-15 ENCOUNTER — Other Ambulatory Visit: Payer: Self-pay | Admitting: Registered Nurse

## 2022-10-15 DIAGNOSIS — D500 Iron deficiency anemia secondary to blood loss (chronic): Secondary | ICD-10-CM

## 2022-10-15 NOTE — Progress Notes (Signed)
Pt in clinic today for [facility]. Venipuncture right AC w/o complications. Pt tolerated well.

## 2022-10-16 ENCOUNTER — Encounter: Payer: Self-pay | Admitting: Registered Nurse

## 2022-10-16 LAB — CMP12+LP+TP+TSH+6AC+CBC/D/PLT
ALT: 6 IU/L (ref 0–32)
AST: 11 IU/L (ref 0–40)
Albumin: 5 g/dL — ABNORMAL HIGH (ref 3.9–4.9)
Alkaline Phosphatase: 60 IU/L (ref 44–121)
BUN/Creatinine Ratio: 18 (ref 9–23)
BUN: 12 mg/dL (ref 6–24)
Basophils Absolute: 0.1 10*3/uL (ref 0.0–0.2)
Basos: 2 %
Bilirubin Total: 0.3 mg/dL (ref 0.0–1.2)
Calcium: 9.1 mg/dL (ref 8.7–10.2)
Chloride: 104 mmol/L (ref 96–106)
Chol/HDL Ratio: 2.1 ratio (ref 0.0–4.4)
Cholesterol, Total: 114 mg/dL (ref 100–199)
Creatinine, Ser: 0.68 mg/dL (ref 0.57–1.00)
EOS (ABSOLUTE): 0 10*3/uL (ref 0.0–0.4)
Eos: 0 %
Estimated CHD Risk: <0.5 times avg. (ref 0.0–1.0)
Free Thyroxine Index: 2.4 (ref 1.2–4.9)
GGT: 28 IU/L (ref 0–60)
Globulin, Total: 2.7 g/dL (ref 1.5–4.5)
Glucose: 96 mg/dL (ref 70–99)
HDL: 54 mg/dL (ref >39)
Hematocrit: 30 % — ABNORMAL LOW (ref 34.0–46.6)
Hemoglobin: 7.7 g/dL — ABNORMAL LOW (ref 11.1–15.9)
Immature Grans (Abs): 0 10*3/uL (ref 0.0–0.1)
Immature Granulocytes: 0 %
Iron: 477 ug/dL (ref 27–159)
LDH: 134 IU/L (ref 119–226)
LDL Chol Calc (NIH): 48 mg/dL (ref 0–99)
Lymphocytes Absolute: 1.2 10*3/uL (ref 0.7–3.1)
Lymphs: 22 %
MCH: 16.8 pg — ABNORMAL LOW (ref 26.6–33.0)
MCHC: 25.7 g/dL — ABNORMAL LOW (ref 31.5–35.7)
MCV: 66 fL — ABNORMAL LOW (ref 79–97)
Monocytes Absolute: 0.5 10*3/uL (ref 0.1–0.9)
Monocytes: 10 %
Neutrophils Absolute: 3.6 10*3/uL (ref 1.4–7.0)
Neutrophils: 66 %
Phosphorus: 2.7 mg/dL — ABNORMAL LOW (ref 3.0–4.3)
Platelets: 229 10*3/uL (ref 150–450)
Potassium: 4.4 mmol/L (ref 3.5–5.2)
RBC: 4.57 x10E6/uL (ref 3.77–5.28)
RDW: 18.6 % — ABNORMAL HIGH (ref 11.7–15.4)
Sodium: 138 mmol/L (ref 134–144)
T3 Uptake Ratio: 27 % (ref 24–39)
T4, Total: 8.9 ug/dL (ref 4.5–12.0)
TSH: 1.42 u[IU]/mL (ref 0.450–4.500)
Total Protein: 7.7 g/dL (ref 6.0–8.5)
Triglycerides: 54 mg/dL (ref 0–149)
Uric Acid: 4.1 mg/dL (ref 2.6–6.2)
VLDL Cholesterol Cal: 12 mg/dL (ref 5–40)
WBC: 5.5 10*3/uL (ref 3.4–10.8)
eGFR: 107 mL/min/{1.73_m2} (ref >59)

## 2022-10-16 LAB — HGB A1C W/O EAG: Hgb A1c MFr Bld: 5.5 % (ref 4.8–5.6)

## 2022-10-16 NOTE — Addendum Note (Signed)
Addended by: Albina Billet A on: 10/16/2022 01:58 PM   Modules accepted: Orders, Level of Service

## 2022-10-16 NOTE — Progress Notes (Signed)
Please schedule patient for appt with me Thursday 10/18/22   Phosphorus slightly low and albumin slightly elevated.  CBC still with anemia improved from 06/14/22.  Iron very elevated.  Vitamin D low 04/26/22 23.7  I recommend follow up with PCM last appt 05/28/22 and has been seen in ER per epic review.  Please see RN Burna Mortimer if you want printed copy of results or results electronically to [facility].  Follow up with PCM elevated blood pressure and anemia.  Exitcare handouts sent on low vitamin D, phosphorus, blood pressure management, dash diet and anemia.  BP 151/104 and BMI 30.82 elevated above recommended levels blood pressure greater than 110s/60s and height weight ratio of 25 I recommend weight loss, exercise 150 minutes per week; dietary fiber daily by mouth 20 grams women per up to date; eat whole grains/fruits/vegetables; keep added sugars to less than 100 calories/ 5 teaspoons for women per American Heart Association;  blood sugar spot and 3 month average, cholesterol, electrolytes, kidney/liver function, thyroid.  No infection noted on complete blood count   I recommend repeat CBC and iron level in 3 months and fasting labs in 1 year.  I recommend taking a vitamin D supplement daily with meal will discuss dose at appt.  Please let us know if you have further questions.  Sincerely,  Albina Billet NP-C

## 2022-10-17 NOTE — Telephone Encounter (Signed)
See results note dated 10/16/22 RN Burna Mortimer notified to contact patient and schedule appt with me for 10/18/22

## 2022-10-18 ENCOUNTER — Ambulatory Visit: Payer: Medicaid Other | Admitting: Registered Nurse

## 2022-10-18 VITALS — BP 130/76 | HR 75 | Temp 98.4°F

## 2022-10-18 DIAGNOSIS — N951 Menopausal and female climacteric states: Secondary | ICD-10-CM

## 2022-10-18 DIAGNOSIS — D500 Iron deficiency anemia secondary to blood loss (chronic): Secondary | ICD-10-CM

## 2022-10-18 DIAGNOSIS — R7989 Other specified abnormal findings of blood chemistry: Secondary | ICD-10-CM

## 2022-10-18 NOTE — Progress Notes (Signed)
Subjective:    Patient ID: Mallory Robinson, female    DOB: August 11, 1975, 47 y.o.   MRN: [id number]  47y/o established patient african [place] female established patient here for lab results; Last gyn appt a few years ago right after my stroke; menses has decreased in flow now only 2 days versus 10 and flow much lighter.  Previously had to wear ultra super tampon and pad and change hourly due to heavy blood loss x 10 days per month. Has required blood transfusions in the past. In the past couple of months flow lighter and only lasting 2 days now "I think I am in menopause"  She is only taking iron once a day not twice a day and still making her nauseous.  Has been eating out more over the summer and not as healthy of choices and notices it affects her blood pressure and how she feels.  Recently cut back on soda sugar sweetened intake as she noticed it made her feel bad.  Previously she and spouse followed low fat keto with more vegetables fewer fruits and she plans to return to this meal prep after the holiday weekend/September.  Child just returned to university and was stressful getting them moved.  Has a dog walks dog.  Denied visual changes, headache, chest pain, symptoms of another TIA/Stroke in the past month.  Has blood pressure monitor at home and taking her blood pressure when not feeling great to ensure not too high due to stroke history.  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  Patient reported took vitamin D supplement short term and then stopped  History of low level with PCM     Review of Systems  Constitutional:  Negative for chills, diaphoresis and fever.  HENT:  Negative for trouble swallowing and voice change.   Eyes:  Negative for photophobia, pain, discharge, redness and visual disturbance.  Respiratory:  Negative for cough, choking, chest tightness, shortness of breath, wheezing and  stridor.   Cardiovascular:  Negative for chest pain and palpitations.  Gastrointestinal:  Positive for nausea. Negative for abdominal pain, diarrhea and vomiting.  Genitourinary:  Negative for difficulty urinating.  Musculoskeletal:  Negative for gait problem, neck pain and neck stiffness.  Skin:  Negative for rash.  Neurological:  Negative for dizziness, tremors, seizures, syncope, facial asymmetry, speech difficulty, weakness, light-headedness and numbness.  Psychiatric/Behavioral:  Negative for agitation, confusion and sleep disturbance.        Objective:   Physical Exam Vitals and nursing note reviewed.  Constitutional:      General: She is awake. She is not in acute distress.    Appearance: Normal appearance. She is well-developed, well-groomed and overweight. She is not ill-appearing, toxic-appearing or diaphoretic.  HENT:     Head: Normocephalic and atraumatic.     Jaw: There is normal jaw occlusion.     Salivary Glands: Right salivary gland is not diffusely enlarged or tender. Left salivary gland is not diffusely enlarged or tender.     Right Ear: Hearing and external ear normal. No decreased hearing noted.     Left Ear: Hearing and external ear normal. No decreased hearing noted.     Nose: Nose normal. No congestion or rhinorrhea.     Mouth/Throat:     Lips: Pink. No lesions.     Mouth: Mucous membranes are moist. No oral lesions or angioedema.     Dentition: No gum  lesions.     Tongue: No lesions. Tongue does not deviate from midline.     Palate: No mass and lesions.     Pharynx: Oropharynx is clear. Uvula midline.  Eyes:     General: Lids are normal. Vision grossly intact. Gaze aligned appropriately. No allergic shiner or scleral icterus.       Right eye: No discharge.        Left eye: No discharge.     Extraocular Movements: Extraocular movements intact.     Conjunctiva/sclera: Conjunctivae normal.     Pupils: Pupils are equal, round, and reactive to light.  Neck:      Trachea: Trachea and phonation normal.  Cardiovascular:     Rate and Rhythm: Normal rate and regular rhythm.     Pulses:          Radial pulses are 2+ on the right side and 2+ on the left side.     Heart sounds: Normal heart sounds, S1 normal and S2 normal.  Pulmonary:     Effort: Pulmonary effort is normal.     Breath sounds: Normal breath sounds and air entry. No stridor or transmitted upper airway sounds. No wheezing.     Comments: Spoke full sentences without difficulty; no cough observed in exam room Abdominal:     Palpations: Abdomen is soft.  Musculoskeletal:        General: Normal range of motion.     Right hand: Normal strength. Normal capillary refill.     Left hand: Normal strength. Normal capillary refill.     Cervical back: Normal range of motion and neck supple. No edema, erythema, signs of trauma, rigidity, torticollis or crepitus. No pain with movement. Normal range of motion.     Right ankle: No swelling.     Left ankle: No swelling.  Lymphadenopathy:     Head:     Right side of head: No submandibular or preauricular adenopathy.     Left side of head: No submandibular or preauricular adenopathy.     Cervical:     Right cervical: No superficial cervical adenopathy.    Left cervical: No superficial cervical adenopathy.  Skin:    General: Skin is warm and dry.     Capillary Refill: Capillary refill takes less than 2 seconds.     Coloration: Skin is not ashen, cyanotic, jaundiced, mottled, pale or sallow.     Findings: No abrasion, abscess, acne, bruising, burn, ecchymosis, erythema, signs of injury, laceration, lesion, petechiae, rash or wound.     Nails: There is no clubbing.     Comments: Unclothed skin visible in exam room  Neurological:     General: No focal deficit present.     Mental Status: She is alert and oriented to person, place, and time. Mental status is at baseline.     GCS: GCS eye subscore is 4. GCS verbal subscore is 5. GCS motor subscore is 6.      Cranial Nerves: No cranial nerve deficit or dysarthria.     Motor: Motor function is intact. No weakness, tremor, atrophy, abnormal muscle tone or seizure activity.     Coordination: Coordination is intact. Coordination normal.     Gait: Gait is intact. Gait normal.     Comments: In/out of chair and on/off exam table without difficulty; gait sure and steady in clinic; bilateral hand grasp equal 5/5  Psychiatric:        Attention and Perception: Attention and perception normal.  Mood and Affect: Mood and affect normal.        Speech: Speech normal.        Behavior: Behavior normal. Behavior is cooperative.        Thought Content: Thought content normal.        Cognition and Memory: Cognition and memory normal.        Judgment: Judgment normal.     Latest Reference Range & Units 10/15/22 08:55  Sodium 134 - 144 mmol/L 138  Potassium 3.5 - 5.2 mmol/L 4.4  Chloride 96 - 106 mmol/L 104  Glucose 70 - 99 mg/dL 96  BUN 6 - 24 mg/dL 12  Creatinine 6.96 - 2.95 mg/dL 2.84  Calcium 8.7 - 13.2 mg/dL 9.1  BUN/Creatinine Ratio 9 - 23  18  eGFR >59 mL/min/1.73 107  Phosphorus 3.0 - 4.3 mg/dL 2.7 (L)  Alkaline Phosphatase 44 - 121 IU/L 60  Albumin 3.9 - 4.9 g/dL 5.0 (H)  Uric Acid 2.6 - 6.2 mg/dL 4.1  AST 0 - 40 IU/L 11  ALT 0 - 32 IU/L 6  Total Protein 6.0 - 8.5 g/dL 7.7  Total Bilirubin 0.0 - 1.2 mg/dL 0.3  GGT 0 - 60 IU/L 28  Estimated CHD Risk 0.0 - 1.0 times avg.  < 0.5  LDH 119 - 226 IU/L 134  Total CHOL/HDL Ratio 0.0 - 4.4 ratio 2.1  Cholesterol, Total 100 - 199 mg/dL 440  HDL Cholesterol >10 mg/dL 54  Triglycerides 0 - 272 mg/dL 54  VLDL Cholesterol Cal 5 - 40 mg/dL 12  LDL Chol Calc (NIH) 0 - 99 mg/dL 48  Iron 27 - 536 ug/dL 644 (HH)  Globulin, Total 1.5 - 4.5 g/dL 2.7  WBC 3.4 - 03.4 V42V9/DG 5.5  RBC 3.77 - 5.28 x10E6/uL 4.57  Hemoglobin 11.1 - 15.9 g/dL 7.7 (L)  HCT 38.7 - 56.4 % 30.0 (L)  MCV 79 - 97 fL 66 (L)  MCH 26.6 - 33.0 pg 16.8 (L)  MCHC 31.5 - 35.7 g/dL  33.2 (L)  RDW 95.1 - 88.4 % 18.6 (H)  Platelets 150 - 450 x10E3/uL 229  Neutrophils Not Estab. % 66  Immature Granulocytes Not Estab. % 0  NEUT# 1.4 - 7.0 x10E3/uL 3.6  Lymphocyte # 0.7 - 3.1 x10E3/uL 1.2  Monocytes Absolute 0.1 - 0.9 x10E3/uL 0.5  Basophils Absolute 0.0 - 0.2 x10E3/uL 0.1  Immature Grans (Abs) 0.0 - 0.1 x10E3/uL 0.0  Lymphs Not Estab. % 22  Monocytes Not Estab. % 10  Basos Not Estab. % 2  Eos Not Estab. % 0  EOS (ABSOLUTE) 0.0 - 0.4 x10E3/uL 0.0  (HH): Data is critically high (L): Data is abnormally low (H): Data is abnormally high  Patient given printed copy of lab results.  Discussed results in detail and recommendations.  Discussed Hgba1c results can be affected by anemia.  Spot glucose was normal.  Patient was wondering if blood sugar was off when she was feeling fatigued in the past few months.  Discussed lab recommendations and patient plans to have follow up labs with PCM.  Blood pressure much improved today but was very elevated at [facility] appt with RN.  Discussed phosphorus can be related to soda/tea intake and low vitamin D level also.  Phosphorus slightly low and albumin slightly elevated.  CBC still with anemia improved from 06/14/22.  Iron very elevated.  Vitamin D low 04/26/22 23.7  I recommend follow up with PCM last appt 05/28/22 and has been seen in ER per epic  review.  Please see RN Burna Mortimer if you want printed copy of results or results electronically to [facility].  Follow up with PCM elevated blood pressure and anemia.  Exitcare handouts sent on low vitamin D, phosphorus, blood pressure management, dash diet and anemia.  BP 151/104 and BMI 30.82 elevated above recommended levels blood pressure greater than 110s/60s and height weight ratio of 25 I recommend weight loss, exercise 150 minutes per week; dietary fiber daily by mouth 20 grams women per up to date; eat whole grains/fruits/vegetables; keep added sugars to less than 100 calories/ 5 teaspoons for women per American  Heart Association;  blood sugar spot and 3 month average, cholesterol, electrolytes, kidney/liver function, thyroid.  No infection noted on complete blood count   I recommend repeat CBC and iron level in 3 months and fasting labs in 1 year.  I recommend taking a vitamin D supplement daily with meal will discuss dose at appt.     Assessment & Plan:  A-Iron deficiency anemia due to chronic blood loss, low serum D, perimenopause  P-Schedule follow up with GYN last appt 3 years ago per epic review unable to see PAP results.  Discussed with patient cervical cancer screening every 3-5 years depending on if HPV screening also performed and if positive in the past. Continue DASH diet patient plans to increase servings of vegetables and eat out less/meal prep with spouse/keep sodium under 2000mg  per day, walk dog/exercise more and work on weight loss.  Encouraged 5-6 servings of fruits and vegetables per day.  Lean proteins to include beans/nuts/dairy.  Patient stated doesn't eat many beans.  She reported weight gain over the past few months due to eating out and stopping meal prep at home/bringing lunch to work.  Patient has blood pressure cuff at home and monitors pressures since she had stroke Bring log to her PCM appt.  Recommended routine follow up with PCM since she has had ER visits and not seen her PCM since ER visits  Serum iron elevated take iron every other day since menses flow has decreased from 10 to 2 days per month now.  Anemia slightly improved but still anemic.  Exitcare handout on perimenopause  Discussed anemia can cause fatigue  Has not been taking vitamin D supplement discussed restarting 2000 units by mouth daily with meal as history of deficiency on lab check by PCM  [employer] not drawing vitamin D levels due to new national guidelines.  Patient agreed with plan of care and had no further questions at this time.

## 2022-10-18 NOTE — Patient Instructions (Addendum)
Perimenopause Perimenopause is the normal time of a woman's life when the levels of estrogen, the female hormone produced by the ovaries, begin to decrease. This leads to changes in menstrual periods before they stop completely (menopause). Perimenopause can begin 2-8 years before menopause. During perimenopause, the ovaries may or may not produce an egg and a woman can still become pregnant. What are the causes? This condition is caused by a natural change in hormone levels that happens as you get older. What increases the risk? This condition is more likely to start at an earlier age if you have certain medical conditions or have undergone treatments, including: A tumor of the pituitary gland in the brain. A disease that affects the ovaries and hormone production. Certain cancer treatments, such as chemotherapy or hormone therapy, or radiation therapy on the pelvis. Heavy smoking and excessive alcohol use. Family history of early menopause. What are the signs or symptoms? Perimenopausal changes affect each woman differently. Symptoms of this condition may include: Hot flashes. Irregular menstrual periods. Night sweats. Changes in feelings about sex. This could be a decrease in sex drive or an increased discomfort around your sexuality. Vaginal dryness. Headaches. Mood swings. Depression. Problems sleeping (insomnia). Memory problems or trouble concentrating. Irritability. Tiredness. Weight gain. Anxiety. Trouble getting pregnant. How is this diagnosed? This condition is diagnosed based on your medical history, a physical exam, your age, your menstrual history, and your symptoms. Hormone tests may also be done. How is this treated? In some cases, no treatment is needed. You and your health care provider should make a decision together about whether treatment is necessary. Treatment will be based on your individual condition and preferences. Various treatments are available, such  as: Menopausal hormone therapy (MHT). Medicines to treat specific symptoms. Acupuncture. Vitamin or herbal supplements. Before starting treatment, make sure to let your health care provider know if you have a personal or family history of: Heart disease. Breast cancer. Blood clots. Diabetes. Osteoporosis. Follow these instructions at home: Medicines Take over-the-counter and prescription medicines only as told by your health care provider. Take vitamin supplements only as told by your health care provider. Talk with your health care provider before starting any herbal supplements. Lifestyle  Do not use any products that contain nicotine or tobacco, such as cigarettes, e-cigarettes, and chewing tobacco. If you need help quitting, ask your health care provider. Get at least 30 minutes of physical activity on 5 or more days each week. Eat a balanced diet that includes fresh fruits and vegetables, whole grains, soybeans, eggs, lean meat, and low-fat dairy. Avoid alcoholic and caffeinated beverages, as well as spicy foods. This may help prevent hot flashes. Get 7-8 hours of sleep each night. Dress in layers that can be removed to help you manage hot flashes. Find ways to manage stress, such as deep breathing, meditation, or journaling. General instructions  Keep track of your menstrual periods, including: When they occur. How heavy they are and how long they last. How much time passes between periods. Keep track of your symptoms, noting when they start, how often you have them, and how long they last. Use vaginal lubricants or moisturizers to help with vaginal dryness and improve comfort during sex. You can still become pregnant if you are having irregular periods. Make sure you use contraception during perimenopause if you do not want to get pregnant. Keep all follow-up visits. This is important. This includes any group therapy or counseling. Contact a health care provider if: You  have  heavy vaginal bleeding or pass blood clots. Your period lasts more than 2 days longer than normal. Your periods are recurring sooner than 21 days. You bleed after having sex. You have pain during sex. Get help right away if you have: Chest pain, trouble breathing, or trouble talking. Severe depression. Pain when you urinate. Severe headaches. Vision problems. Summary Perimenopause is the time when a woman's body begins to move into menopause. This may happen naturally or as a result of other health problems or medical treatments. Perimenopause can begin 2-8 years before menopause, and it can last for several years. Perimenopausal symptoms can be managed through medicines, lifestyle changes, and complementary therapies such as acupuncture. This information is not intended to replace advice given to you by your health care provider. Make sure you discuss any questions you have with your health care provider. Document Revised: 07/23/2019 Document Reviewed: 07/23/2019 Elsevier Patient Education  2024 [employer].Vitamin D Deficiency Vitamin D deficiency is when your body does not have enough vitamin D. Vitamin D is important to your body because: It helps the body maintain calcium and phosphorus levels. These are important minerals. It plays a role in bone health. It reduces inflammation. It improves the body's defense system (immune system). If vitamin D deficiency is severe, it can cause a condition in which your bones become soft. In adults, this condition is called osteomalacia. In children, this condition is called rickets. What are the causes? This condition may be caused by: Not eating enough foods that contain vitamin D. Not getting enough natural sun exposure. Having certain digestive system diseases that make it difficult for your body to absorb vitamin D. These diseases include Crohn's disease, long-term (chronic) pancreatitis, and cystic fibrosis. Having had a surgery in  which a part of the stomach or a part of the small intestine was removed. What increases the risk? You are more likely to develop this condition if you: Are an older adult. Do not spend much time outdoors. Live in a long-term care facility. Have dark skin. Take certain medicines, such as steroid medicines or certain seizure medicines. Are overweight or obese. Have chronic kidney or liver disease. What are the signs or symptoms? In mild cases of vitamin D deficiency, there may not be any symptoms. If the condition is severe, symptoms may include: Bone pain. Muscle pain. Not being able to walk normally (abnormal gait). Broken bones caused by a minor injury. Joint pain. How is this diagnosed? This condition may be diagnosed with blood tests. Imaging tests such as X-rays may also be done to look for changes in the bone. How is this treated? Treatment may include taking supplements as told by your health care provider. Your health care provider will tell you what dose is best for you. Supplements may include: Vitamin D. Calcium. Follow these instructions at home: Eating and drinking Eat foods that contain vitamin D, such as: Dairy products, cereals, or juices that have vitamin D added to them (are fortified). Check the label. Fish, such as salmon or trout. Eggs. The vitamin D is in the yolk. Mushrooms that were treated with UV light. Beef liver. The items listed above may not be a complete list of foods and beverages you can eat and drink. Contact a dietitian for more information. General instructions Take over-the-counter and prescription medicines only as told by your health care provider. Take supplements only as told by your health care provider. Get regular, safe exposure to natural sunlight. Do not use a tanning bed.  Maintain a healthy weight. Lose weight if needed. Keep all follow-up visits. This is important. How is this prevented? You can get vitamin D by: Eating foods  that naturally contain vitamin D. Eating or drinking products that have been fortified with vitamin D, such as cereals, juices, and dairy products, including milk. Taking a vitamin D supplement or a multivitamin that contains vitamin D. Being in the sun. Your body naturally makes vitamin D when your skin is exposed to sunlight. Your body changes the sunlight into a form of the vitamin that it can use. Contact a health care provider if: Your symptoms do not go away. You feel nauseous or you vomit. You have fewer bowel movements than usual or are constipated. Summary Vitamin D deficiency is when your body does not have enough vitamin D. Vitamin D helps to keep your bones healthy. Vitamin D deficiency is primarily treated by taking supplements. Your health care provider will suggest what dose is best for you. You can get vitamin D by eating foods that contain vitamin D, by being in the sun, and by taking a vitamin D supplement or a multivitamin that contains vitamin D. This information is not intended to replace advice given to you by your health care provider. Make sure you discuss any questions you have with your health care provider. Document Revised: 11/11/2020 Document Reviewed: 11/11/2020 Elsevier Patient Education  2024 [employer] Inc. Anemia  Anemia is a condition in which there are not enough red blood cells or hemoglobin in the blood. Hemoglobin is a substance in red blood cells that carries oxygen. When you do not have enough red blood cells or hemoglobin (are anemic), your body cannot get enough oxygen, and your organs may not work properly. As a result, you may feel very tired or have other problems. What are the causes? Common causes of anemia include: Excessive bleeding. Anemia can be caused by excessive bleeding inside or outside the body, including bleeding from the intestines or from heavy menstrual periods in females. Poor nutrition. Long-lasting (chronic) kidney, thyroid, and  liver disease. Bone marrow disorders, spleen problems, and blood disorders. Cancer and treatments for cancer. Human immunodeficiency virus (HIV) and acquired immunodeficiency syndrome (AIDS). Infections, medicines, and autoimmune disorders that destroy red blood cells. What are the signs or symptoms? Symptoms of this condition include: Minor weakness. Dizziness. Headache, or difficulties concentrating and sleeping. Heartbeats that feel irregular or faster than normal (palpitations). Shortness of breath, especially with exercise. Pale skin, lips, and nails, or cold hands and feet. Upset stomach (indigestion) and nausea. Symptoms may occur suddenly or develop slowly. If your anemia is mild, you may not have symptoms. How is this diagnosed? This condition is diagnosed based on blood tests, your medical history, and a physical exam. In some cases, a test may be needed in which cells are removed from the soft tissue inside of a bone and looked at under a microscope (bone marrow biopsy). Your health care provider may also check your stool (feces) for blood and may do more testing to look for the cause of your bleeding. Other tests may include: Imaging tests, such as a CT scan or MRI. A procedure to see inside your esophagus and stomach (endoscopy). The esophagus is the part of the body that moves food from your mouth to your stomach. A procedure to see inside your colon and rectum (colonoscopy). How is this treated? Treatment for this condition depends on the cause. If you continue to lose a lot of blood, you  may need to be treated at a hospital. Treatment may include: Taking supplements of iron, vitamin B12, or folic acid. Taking a hormone medicine (erythropoietin) that can help to stimulate red blood cell growth. Receiving donated blood through an IV (blood transfusion). This may be needed if you lose a lot of blood. Making changes to your diet. Having surgery to remove your spleen. Follow  these instructions at home: Take over-the-counter and prescription medicines only as told by your health care provider. Take supplements only as told by your health care provider. Follow any diet instructions that you were given by your health care provider. Keep all follow-up visits. Your health care provider will want to recheck your blood tests. Contact a health care provider if: You develop new bleeding anywhere in the body. You are very weak. Get help right away if: You are short of breath. You have pain in your abdomen or chest. You are dizzy or feel faint. You have trouble concentrating. You have bloody stools, black stools, or tarry stools. You vomit repeatedly or you vomit up blood. These symptoms may be an emergency. Get help right away. Call 911. Do not wait to see if the symptoms will go away. Do not drive yourself to the hospital. Summary Anemia is a condition in which you do not have enough red blood cells or enough of a substance in your red blood cells that carries oxygen. Symptoms may occur suddenly or develop slowly. If your anemia is mild, you may not have symptoms. This condition is diagnosed with blood tests, a medical history, and a physical exam. Other tests may be needed. Treatment for this condition depends on the cause of the anemia. This information is not intended to replace advice given to you by your health care provider. Make sure you discuss any questions you have with your health care provider. Document Revised: 05/01/2021 Document Reviewed: 05/01/2021 Elsevier Patient Education  2024 [employer].

## 2022-10-19 ENCOUNTER — Encounter: Payer: Self-pay | Admitting: Registered Nurse

## 2022-10-19 MED ORDER — VITAMIN D3 50 MCG (2000 UT) PO CAPS: 2000.0000 [IU] | ORAL_CAPSULE | Freq: Every day | ORAL | 3 refills | Status: DC

## 2022-10-24 ENCOUNTER — Telehealth: Payer: Self-pay | Admitting: Registered Nurse

## 2022-10-24 ENCOUNTER — Other Ambulatory Visit: Payer: Self-pay | Admitting: *Deleted

## 2022-10-24 ENCOUNTER — Encounter: Payer: Self-pay | Admitting: Registered Nurse

## 2022-10-24 DIAGNOSIS — J4521 Mild intermittent asthma with (acute) exacerbation: Secondary | ICD-10-CM

## 2022-10-24 DIAGNOSIS — U071 COVID-19: Secondary | ICD-10-CM

## 2022-10-24 MED ORDER — CARVEDILOL 6.25 MG PO TABS: 6.2500 mg | ORAL_TABLET | Freq: Two times a day (BID) | ORAL | 1 refills | Status: AC

## 2022-10-24 NOTE — Telephone Encounter (Unsigned)
pt reported positive home test 10/24/2022 stated headache started this afternoon and did home test after work and positive.  Denied symptoms yesterday.  Day 0 10/24/2022  Denied known sick contacts.  Reported close contacts in previous 48hours at work e.g. no mask greater than 15 minutes within 6 feet face to face contact.  Contacts notified to monitor for symptoms and home test per CDC guidelines.  Pt began quarantine this evening. Patient did not develop symptoms of  trouble breathing, chest pain, nausea, vomiting, diarrhea, sore throat,body aches, fever or chills.   5 day quarantine per [facility] recommendations. Day 1 of quarantine 10/25/22. Patient to contact clinic staff if vomiting after coughing or unable to tolerate po fluids.  Discussed flu and other viral illnesses circulating in community and some causing GI upset.  If GI upset I have recommended clear fluids then bland diet.  Avoid dairy/spicy, fried and large portions of meat while having nausea.  If vomiting hold po intake x 1 hour.  Then sips clear fluids like broths, ginger ale, power ade, gatorade, pedialyte may advance to soft/bland if no vomiting x 24 hours and appetite returned otherwise hydration main focus. Call me at work from home number if symptoms not improved with plan of care  patient to call if high fever, dehydration, marked weakness, fainting, increased abdominal pain, blood in stool or vomit (red or black).     Reviewed possible Covid symptoms including cough, shortness of breath with exertion or at rest, runny nose, congestion, sinus pain/pressure, sore throat, fever/chills, body aches, fatigue, loss of taste/smell, GI symptoms of nausea/vomiting/diarrhea. same day/emergent eval/ER precautions of dizziness/syncope, confusion, blue tint to lips/face, severe shortness of breath/difficulty breathing/wheezing.   Patient has BP monitor at home asked her to check BP 144/86  Patient to isolate in own room and if possible use only one bathroom  if living with others in home.  Wear mask when out of room to help prevent spread to others in household.  Sanitize high touch surfaces with lysol/chlorox/bleach spray or wipes daily as viruses are known to live on surfaces from 24 hours to days.  Patient does not want antivirals.  I have had covid many times and these symptoms are very mild compared to other times.  Patient at higher risk for hospitalization due anemia, low vitamin D, thrombocytosis, migraine obesity, hypertension,and immune suppression treatment/disease.  Recommend annual booster in covid vaccine 60 days after infection resolution.  Patient is on prescription medications or daily medications. If taking medications interaction checker epocrates used to verify if any drug interactions. Paxlovid not recommended with carvedilol use.  Molnupiravir preferred antiviral for patient at this time if worsening symptoms.  Molnupiravir emergency use handout sent to patient electronically along with covid quarantine exitcare handout both in my chart and to personal email.  molnupiravir 400mg  take 4 tabs by mouth every 12 hours x 5 days.  most common side effects GI upset and bad taste in mouth.  Use birth control/avoid getting pregnant while on molnupiravir and no breastfeeding.  Discussed I recommended not having sex with anyone while sick/testing positive/10 day quarantine as could spread virus to partner.  Discussed with patient I would call again this weekend to follow up symptoms/see if questions/concerns.  Exitcare handouts on covid quarantine/home care sent to patient my chart/email.  FDA handout on molnupiravir sent to patient.  Patient has nasal saline and honey at home.  Prefers homeopathic remedies due to stroke history.  May use salt water gargles and nasal saline  2 sprays each nostril q2h prn congestion/sore throat.  Research has shown it helps to prevent hospitalizations and decrease discomfort.   May use flonase nasal 1 spray each  nostril BID prn rhinitis.  Discussed honey 1 tablespoon every 4 hours is a natural cough suppressant but caution due to his diabetes.  Avoid dehydration and drink water to keep urine pale yellow clear and voiding every 2-4 hours while awake.  Patient alert and oriented x3, spoke full sentences without difficulty.    No nasal congestion/cough/throat clearing/hoarse voice/wheezing/shortness of breath during 5 minute telephone call.  Discussed with patient can contact NP Inetta Fermo through my chart/[phone] when clinic closed if questions or concerns until RN  returns to clinic on Monday [phone].   Pt verbalized understanding and agreement with plan of care. No further questions/concerns at this time. Pt reminded to contact clinic with any changes in symptoms or questions/concerns. HR notified patient to work remote/quarantine through Day 5 RTW estimated 10/29/22 with strict mask wear through Day 10 and no eating in employee lunch room.    Supervisor notified of excused absence.

## 2022-10-25 MED ORDER — MOLNUPIRAVIR EUA 200MG CAPSULE: 4.0000 | ORAL_CAPSULE | Freq: Two times a day (BID) | ORAL | 0 refills | Status: AC

## 2022-10-25 MED ORDER — PREDNISONE 10 MG PO TABS: ORAL_TABLET | ORAL | 0 refills | Status: AC

## 2022-10-25 NOTE — Telephone Encounter (Signed)
Patient reported had to use albuterol inhaler last night and around 4pm today chest tightness.  My asthma flaring up I feel like I need steroids.  Has tolerated 60/50/40/30/20/10mg  prednisone 10mg  taper in the past without difficulty  Electronic Rx sent to her pharmacy of choice #21 RF0 take with breakfast daily x 6 days.  She would like to start molnupiravir today as feeling worse fatigue running low grade fever 100.76F, congestion/rhinitis today also.  Electronic Rx molnupiravir 400mg  sig t4 po BID x 5 days #40 RF0 sent to her pharmacy of choice.  Patient A&Ox3 spoke full sentences without difficulty no cough audible during 5 minute telephone call but positive nasal sniffing/congestion and hoarse voice.  She is using flonase and nasal saline BID prn.  Discussed honey 1 tablespoon po every 4 hours as she does not want to take other cough suppressants.  Tolerating po intake without difficulty.  Urinating on regular basis.  Drinking water.  Continue quarantine at home.  Patient agreed with plan of care and had no further questions at this time.

## 2022-10-28 NOTE — Telephone Encounter (Signed)
Patient reported antiviral molnupiravir and prednisone has helped a great deal chest cleared up and doesn't feel like elephant on her chest any longer breathing improved.  A&Ox3 spoke full sentences without difficulty.  No audible cough/congestion/throat clearing or nasal sniffing during 4 minute call.  Some fatigue still.  Feels like she will be able to return to work onsite with mask as expected 10/30/22.  Discussed mask wear when around others for additional five days and no eating in employee lunch room this week discussed eating outside or see HR member who will notify her what private room available for use to eat indoors.  Discussed retesting at home prior to removing mask around family members to help prevent spread of infection recommend retesting once symptoms improved off meds discussed usually if symptoms test still positive.  If 2 negative tests on separate days prior to day 10 send picture to [email or web address] and will remove mask wear at work restriction.  Patient verbalized understanding information/instructions and had no further questions at this time.

## 2022-10-31 NOTE — Telephone Encounter (Signed)
Patient seen in workcenter returned to work as expected wearing disposable KN 95 mask at work.  Feeling fatigued and notes HR elevated from baseline all weekend and now.  Prednisone use, covid infection most likely cause will continue to monitor.  70 on pulse check RRR skin warm dry and pink A&Ox3 spoke full sentences without difficulty respirations even and unlabored without audible congestion/throat clearing/nasal sniffing/wheezing or shortness of breath.  Gait sure and stead in workcenter.  Discussed with patient avoid dehydration as would worsen heart rate.  Discussed clinic closed tomorrow and I will be onsite again Thursday 11a-2p.  Patient to notify NP if new or worsening symptoms.  Rest as needed, eat regular meals.  Patient reported appetite is decreased and salad for lunch upset stomach today.  Discussed bland diet e.g. pasta, toast, crackers, eggs, soups trial for meals later this week avoid spicy, fried, large portions or meat or dairy while having GI upset.  Denied vomiting/diarrhea/fever/chills.  Patient agreed with plan of care and had no further questions at this time.

## 2022-11-02 NOTE — Telephone Encounter (Signed)
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  Day 10 11/03/2022

## 2022-11-02 NOTE — Telephone Encounter (Signed)
See office visit note dated 10/18/22

## 2022-11-04 NOTE — Telephone Encounter (Signed)
Spoke with patient via telephone stated symptoms continued to improve and feeling much better.  Heart rate back to baseline and fatigue resolved.  Discussed day 10 was yesterday and if any needs this week RN in clinic M-W 08a-5p and NP in clinic 9-12 Tues 11-2 Thur  Patient A&Ox3 spoke full sentences without difficulty no audible cough/congestion/throat clearing/nasal sniffing.

## 2022-11-20 ENCOUNTER — Other Ambulatory Visit: Payer: Self-pay

## 2022-11-20 ENCOUNTER — Encounter: Payer: Self-pay | Admitting: Registered Nurse

## 2022-11-20 ENCOUNTER — Encounter: Payer: Self-pay | Admitting: Hematology and Oncology

## 2022-11-20 ENCOUNTER — Ambulatory Visit: Payer: Medicaid Other | Admitting: Registered Nurse

## 2022-11-20 ENCOUNTER — Ambulatory Visit
Admission: RE | Admit: 2022-11-20 | Discharge: 2022-11-20 | Disposition: A | Discharge status: Home / Self Care | Payer: Medicaid Other | Source: Ambulatory Visit | Attending: Internal Medicine | Admitting: Internal Medicine

## 2022-11-20 ENCOUNTER — Ambulatory Visit: Payer: Medicaid Other

## 2022-11-20 VITALS — BP 135/89 | HR 80 | Temp 99.0°F | Resp 16

## 2022-11-20 VITALS — BP 131/86 | HR 99 | Temp 98.9°F | Ht 63.0 in | Wt 170.0 lb

## 2022-11-20 DIAGNOSIS — M5442 Lumbago with sciatica, left side: Secondary | ICD-10-CM | POA: Diagnosis not present

## 2022-11-20 DIAGNOSIS — R0602 Shortness of breath: Secondary | ICD-10-CM

## 2022-11-20 DIAGNOSIS — H6593 Unspecified nonsuppurative otitis media, bilateral: Secondary | ICD-10-CM

## 2022-11-20 DIAGNOSIS — M5441 Lumbago with sciatica, right side: Secondary | ICD-10-CM

## 2022-11-20 DIAGNOSIS — M549 Dorsalgia, unspecified: Secondary | ICD-10-CM

## 2022-11-20 MED ORDER — METHOCARBAMOL 500 MG PO TABS: 500.0000 mg | ORAL_TABLET | Freq: Two times a day (BID) | ORAL | 0 refills | Status: DC | PRN

## 2022-11-20 MED ORDER — PREDNISONE 10 MG PO TABS: ORAL_TABLET | ORAL | 0 refills | Status: AC

## 2022-11-20 NOTE — Discharge Instructions (Signed)
I have prescribed you a muscle relaxer for your back pain.  This should also be helpful with your shortness of breath as well as your fluid behind your eardrums.  Take with food to avoid stomach upset.  Follow-up if any symptoms persist or worsen.  I have also provided you with contact information for cardiology for follow-up.

## 2022-11-20 NOTE — Progress Notes (Unsigned)
Subjective:    Patient ID: Mallory Robinson, female    DOB: September 18, 1975, 47 y.o.   MRN: [id number]  47y/o african [place] female here for evaluation back pain "It feels like I have covid again"  Home bp checks improved since we stopped eating out as much.  Denied known sick contacts.  Has had some nasal congestion ?allergies  Denied cough/wheezing/shortness of breath/fever/chills      Review of Systems  Constitutional:  Positive for fatigue. Negative for chills, diaphoresis and fever.  HENT:  Positive for congestion and postnasal drip. Negative for trouble swallowing and voice change.   Eyes:  Negative for photophobia and visual disturbance.  Respiratory:  Negative for cough, shortness of breath, wheezing and stridor.   Cardiovascular:  Negative for chest pain.  Gastrointestinal:  Negative for diarrhea, nausea and vomiting.  Genitourinary:  Negative for difficulty urinating.  Musculoskeletal:  Positive for back pain. Negative for gait problem, neck pain and neck stiffness.  Allergic/Immunologic: Positive for environmental allergies and food allergies.  Neurological:  Negative for dizziness, tremors, seizures, syncope, facial asymmetry, speech difficulty, weakness and light-headedness.  Psychiatric/Behavioral:  Negative for agitation, confusion and sleep disturbance.        Objective:   Physical Exam Vitals and nursing note reviewed.  Constitutional:      General: She is not in acute distress.    Appearance: She is well-developed. She is obese. She is ill-appearing. She is not toxic-appearing or diaphoretic.  HENT:     Head: Normocephalic and atraumatic.     Right Ear: Hearing, tympanic membrane, ear canal and external ear normal. There is no impacted cerumen.     Left Ear: Hearing, tympanic membrane, ear canal and external ear normal. There is no impacted cerumen.     Nose: Rhinorrhea present. No congestion.     Right Sinus: Frontal sinus tenderness present.     Left Sinus:  Frontal sinus tenderness present.     Mouth/Throat:     Mouth: Mucous membranes are normal. Mucous membranes are moist.     Pharynx: Uvula midline. Posterior oropharyngeal edema and posterior oropharyngeal erythema present. No oropharyngeal exudate.  Eyes:     General: Lids are normal.     Extraocular Movements: Extraocular movements intact and EOM normal.     Conjunctiva/sclera: Conjunctivae normal.     Pupils: Pupils are equal, round, and reactive to light.  Neck:     Trachea: Trachea normal.  Cardiovascular:     Rate and Rhythm: Normal rate and regular rhythm.     Pulses: Normal pulses.     Heart sounds: Normal heart sounds.  Pulmonary:     Effort: Pulmonary effort is normal. No respiratory distress.     Breath sounds: Normal breath sounds. No stridor. No wheezing.  Abdominal:     Palpations: Abdomen is soft.  Musculoskeletal:        General: No swelling, tenderness, deformity or signs of injury. Normal range of motion.     Cervical back: Normal range of motion and neck supple. No rigidity or tenderness.     Right lower leg: No edema.     Left lower leg: No edema.  Lymphadenopathy:     Cervical: No cervical adenopathy.  Skin:    General: Skin is warm, dry and intact.     Capillary Refill: Capillary refill takes less than 2 seconds.     Coloration: Skin is not jaundiced or pale.     Findings: No bruising, erythema, lesion or rash.  Neurological:     General: No focal deficit present.     Mental Status: She is alert and oriented to person, place, and time. Mental status is at baseline.     Cranial Nerves: No cranial nerve deficit.     Sensory: No sensory deficit.     Motor: No weakness.     Coordination: Coordination normal.     Gait: Gait normal.  Psychiatric:        Mood and Affect: Mood and affect and mood normal.        Speech: Speech normal.        Behavior: Behavior normal. Behavior is cooperative.        Thought Content: Thought content normal.        Cognition  and Memory: Cognition and memory normal.        Judgment: Judgment normal.      Home covid test negative repeat in 48 hours if new or worsening symptoms as new variant circulating and it is possible to get reinfected     Assessment & Plan:   Given thermacare patch from clinic stock may use tylenol 1000mg  po q6h prn pain.  Discussed gentle stretching, epsom salt bath. Given free [place] govt home covid test to perform prior to returning to workstation

## 2022-11-20 NOTE — ED Triage Notes (Signed)
Patient presents with fluid in right ear, unable to walk for a long distance without getting short of breath, pain radiating in back and headache. Symptoms started Saturday night.  Treated with Tylenol and heat.

## 2022-11-20 NOTE — ED Provider Notes (Signed)
[facility]    CSN: [id number] Arrival date & time: 11/20/22  1313      History   Chief Complaint Chief Complaint  Patient presents with   Fatigue    Dizzy. Leg back pain. - Entered by patient    HPI Mallory Robinson is a 47 y.o. female.   Patient presents with multiple different chief complaints today.  Patient states that she has a feeling of fluid "swishing" in her right ear for the past 4 days or so.  Denies nasal congestion, runny nose, cough, fever.  Patient states that she went to her health at work nurse today who told her that she had fluid in her eardrum but no medications were suggested or prescribed.   Patient also reporting that she has been having some shortness of breath with exertion over the past few days.  She does have a history of asthma.  She recently had COVID-19 a few weeks prior and was prescribed a prednisone steroid taper.  Reports that symptoms resolved, and these are new symptoms.  Denies any known sick contacts.  States that she had a negative COVID test at health at work today.  Denies coughing, nasal congestion, runny nose, fever.  Patient also reporting some bilateral lower back pain that radiates down bilateral legs that started around 4 days ago as well.  Denies any injury to the area.  She reports that she does a lot of prolonged sitting at work.  Denies history of chronic back pain.  Denies urinary burning, urinary frequency, urinary or bowel continence, saddle anesthesia.  Patient has taken Tylenol and applied heat patches with minimal improvement. Back pain typically only occurs with movement.      Past Medical History:  Diagnosis Date   Bipolar 1 disorder (HCC)    Complication of anesthesia    Depression    Hypertension    Low vitamin B12 level 04/11/2019   PONV (postoperative nausea and vomiting)    Stroke [facility])     Patient Active Problem List   Diagnosis Date Noted   Low serum vitamin D 10/12/2022   Iron deficiency anemia  due to chronic blood loss 04/01/2020   OSA (obstructive sleep apnea): Probable 04/12/2019   Folate deficiency 04/11/2019   Low vitamin B12 level 04/11/2019   Stroke-like episode 04/10/2019   Acute nonintractable headache    Left-sided weakness    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HCC) 04/09/2019   Microcytic anemia 04/09/2019   Thrombocytosis 04/09/2019   Chronic migraine 04/09/2019   Hypertension 04/09/2019   Acute ischemic stroke (HCC) 12/27/2018   Cerebral embolism with cerebral infarction 11/11/2017   Acute CVA (cerebrovascular accident) (HCC) 11/11/2017   Neurological deficit present 11/10/2017   Bipolar I disorder (HCC) 03/05/2013   PTSD (post-traumatic stress disorder) 03/05/2013    Past Surgical History:  Procedure Laterality Date   CESAREAN SECTION     X2   HERNIA REPAIR     TEE WITHOUT CARDIOVERSION N/A 11/13/2017   Procedure: TRANSESOPHAGEAL ECHOCARDIOGRAM (TEE) bubble study;  Surgeon: Chilton Si, MD;  Location: [facility] ENDOSCOPY;  Service: Cardiovascular;  Laterality: N/A;    OB History   No obstetric history on file.      Home Medications    Prior to Admission medications   Medication Sig Start Date End Date Taking? Authorizing Provider  methocarbamol (ROBAXIN) 500 MG tablet Take 1 tablet (500 mg total) by mouth 2 (two) times daily as needed for muscle spasms. 11/20/22  Yes , Mickleton E,  FNP  predniSONE (DELTASONE) 10 MG tablet Take 6 tablets (60 mg total) by mouth daily for 1 day, THEN 5 tablets (50 mg total) daily for 1 day, THEN 4 tablets (40 mg total) daily for 1 day, THEN 3 tablets (30 mg total) daily for 1 day, THEN 2 tablets (20 mg total) daily for 1 day, THEN 1 tablet (10 mg total) daily for 1 day. 11/20/22 11/26/22 Yes Gustavus Bryant, FNP  aspirin EC 81 MG tablet Take 81 mg by mouth daily.    [provider]  carvedilol (COREG) 6.25 MG tablet Take 1 tablet (6.25 mg total) by mouth 2 (two) times daily with a meal. 10/24/22   Georganna Skeans, MD   Cholecalciferol (VITAMIN D3) 50 MCG (2000 UT) capsule Take 1 capsule (2,000 Units total) by mouth daily. Take with meal 10/19/22   Betancourt, Jarold Song, NP  ferrous sulfate 325 (65 FE) MG tablet Take 1 tablet (325 mg total) by mouth 2 (two) times daily with a meal. 05/01/22   Georganna Skeans, MD    Family History Family History  Problem Relation Age of Onset   Hypertension Mother    Stroke Father    Stroke Maternal Aunt     Social History Social History   Tobacco Use   Smoking status: Never   Smokeless tobacco: Never  Vaping Use   Vaping status: Never Used  Substance Use Topics   Alcohol use: Not Currently    Comment: none in 2 years -06/08/2021   Drug use: No     Allergies   Food and Penicillins   Review of Systems Review of Systems Per HPI  Physical Exam Triage Vital Signs ED Triage Vitals  Encounter Vitals Group     BP 11/20/22 1351 131/86     Systolic BP Percentile --      Diastolic BP Percentile --      Pulse Rate 11/20/22 1351 99     Resp --      Temp 11/20/22 1351 98.9 F (37.2 C)     Temp Source 11/20/22 1351 Oral     SpO2 11/20/22 1351 98 %     Weight 11/20/22 1350 170 lb (77.1 kg)     Height 11/20/22 1350 5\' 3"  (1.6 m)     Head Circumference --      Peak Flow --      Pain Score 11/20/22 1350 6     Pain Loc --      Pain Education --      Exclude from Growth Chart --    No data found.  Updated Vital Signs BP 131/86 (BP Location: Left Arm)   Pulse 99   Temp 98.9 F (37.2 C) (Oral)   Ht 5\' 3"  (1.6 m)   Wt 170 lb (77.1 kg)   LMP 11/16/2022   SpO2 98%   BMI 30.11 kg/m   Visual Acuity Right Eye Distance:   Left Eye Distance:   Bilateral Distance:    Right Eye Near:   Left Eye Near:    Bilateral Near:     Physical Exam Constitutional:      General: She is not in acute distress.    Appearance: Normal appearance. She is not toxic-appearing or diaphoretic.  HENT:     Head: Normocephalic and atraumatic.     Right Ear: Ear canal and  external ear normal. No drainage, swelling or tenderness. A middle ear effusion is present. Tympanic membrane is not perforated, erythematous or bulging.  Left Ear: Tympanic membrane, ear canal and external ear normal.  Eyes:     Extraocular Movements: Extraocular movements intact.     Conjunctiva/sclera: Conjunctivae normal.  Cardiovascular:     Rate and Rhythm: Normal rate and regular rhythm.     Pulses: Normal pulses.     Heart sounds: Normal heart sounds.  Pulmonary:     Effort: Pulmonary effort is normal. No respiratory distress.     Breath sounds: Normal breath sounds. No stridor. No wheezing, rhonchi or rales.  Musculoskeletal:       Back:     Comments: No tenderness to palpation of back but patient reports pain typically occurs with walking and movement.  No crepitus or step-off noted.  No swelling or discoloration noted.  Neurological:     General: No focal deficit present.     Mental Status: She is alert and oriented to person, place, and time. Mental status is at baseline.     Deep Tendon Reflexes: Reflexes are normal and symmetric.  Psychiatric:        Mood and Affect: Mood normal.        Behavior: Behavior normal.        Thought Content: Thought content normal.        Judgment: Judgment normal.      UC Treatments / Results  Labs (all labs ordered are listed, but only abnormal results are displayed) Labs Reviewed - No data to display  EKG   Radiology DG Chest 2 View  Result Date: 11/20/2022 CLINICAL DATA:  Shortness of breath. EXAM: CHEST - 2 VIEW COMPARISON:  None Available. FINDINGS: The heart size and mediastinal contours are within normal limits. Both lungs are clear. The visualized skeletal structures are unremarkable. IMPRESSION: No active cardiopulmonary disease. Electronically Signed   By: Lupita Raider M.D.   On: 11/20/2022 17:11    Procedures Procedures (including critical care time)  Medications Ordered in UC Medications - No data to  display  Initial Impression / Assessment and Plan / UC Course  I have reviewed the triage vital signs and the nursing notes.  Pertinent labs & imaging results that were available during my care of the patient were reviewed by me and considered in my medical decision making (see chart for details).     1.  Fluid behind TM  Patient does have fluid behind TM.  Patient was prescribed prednisone steroid taper for concern for asthma exacerbation so this should be helpful with this as well.  2.  Shortness of breath with exertion  Patient was originally not complaining of any respiratory viral symptoms so EKG was completed to ensure cardiac etiology was ruled out.  EKG was normal sinus rhythm.  I did hear a heart murmur on examination.  Patient reports that she was told that she had this in childhood but has not been told that she had it since.  Therefore, recommended that she follow-up with cardiology for further evaluation of this and she was provided with contact information.  Chest x-ray completed that was negative for any acute cardiopulmonary process.  Suspect the patient is developing another asthma exacerbation possibly due to a viral illness versus season changes.  Patient had prednisone steroid taper approximately 1 month ago which she tolerated well and I do think patient would benefit from additional steroid taper so this was prescribed for patient for approximately 6 days.  Patient encouraged to use her albuterol inhaler as needed as well.  3.  Lower back pain  I do not think that lower back pain is related to other chief complaints today.  Appears to be musculoskeletal in etiology.  Imaging deferred given no direct spinal tenderness or injury.  Will prescribe muscle relaxer to take as needed.  Patient reports that she has taken a muscle relaxer previously along with narcotic pain medication and her cheeks became itchy so she is not sure if she is allergic to muscle relaxers or narcotic pain  medication.  Will trial muscle relaxer to see if this helps but patient was advised to discontinue medication if she develops allergic reaction and was given strict ER precautions for any anaphylaxis.  Reminded patient this can make her drowsy and do not drive or drink alcohol with taking it.  Advised strict return and ER precautions for all chief complaints today.  Patient verbalized understanding and was agreeable with plan.  Patient is not in any acute distress and vital signs are stable so do not think that emergent evaluation is necessary.  Attempted to call patient to discuss x-ray results but there was no answer. Final Clinical Impressions(s) / UC Diagnoses   Final diagnoses:  Shortness of breath  Acute bilateral low back pain with bilateral sciatica  Fluid level behind tympanic membrane of both ears     Discharge Instructions      I have prescribed you a muscle relaxer for your back pain.  This should also be helpful with your shortness of breath as well as your fluid behind your eardrums.  Take with food to avoid stomach upset.  Follow-up if any symptoms persist or worsen.  I have also provided you with contact information for cardiology for follow-up.     ED Prescriptions     Medication Sig Dispense Auth. Provider   predniSONE (DELTASONE) 10 MG tablet Take 6 tablets (60 mg total) by mouth daily for 1 day, THEN 5 tablets (50 mg total) daily for 1 day, THEN 4 tablets (40 mg total) daily for 1 day, THEN 3 tablets (30 mg total) daily for 1 day, THEN 2 tablets (20 mg total) daily for 1 day, THEN 1 tablet (10 mg total) daily for 1 day. 21 tablet [place], Weston E, [place]   methocarbamol (ROBAXIN) 500 MG tablet Take 1 tablet (500 mg total) by mouth 2 (two) times daily as needed for muscle spasms. 20 tablet [place], Acie Fredrickson, [place]      PDMP not reviewed this encounter.   Gustavus Bryant, [place] 11/20/22 [phone]

## 2022-11-20 NOTE — Patient Instructions (Signed)
Acute Back Pain, Adult Acute back pain is sudden and usually short-lived. It is often caused by an injury to the muscles and tissues in the back. The injury may result from: A muscle, tendon, or ligament getting overstretched or torn. Ligaments are tissues that connect bones to each other. Lifting something improperly can cause a back strain. Wear and tear (degeneration) of the spinal disks. Spinal disks are circular tissue that provide cushioning between the bones of the spine (vertebrae). Twisting motions, such as while playing sports or doing yard work. A hit to the back. Arthritis. You may have a physical exam, lab tests, and imaging tests to find the cause of your pain. Acute back pain usually goes away with rest and home care. Follow these instructions at home: Managing pain, stiffness, and swelling Take over-the-counter and prescription medicines only as told by your health care provider. Treatment may include medicines for pain and inflammation that are taken by mouth or applied to the skin, or muscle relaxants. Your health care provider may recommend applying ice during the first 24-48 hours after your pain starts. To do this: Put ice in a plastic bag. Place a towel between your skin and the bag. Leave the ice on for 20 minutes, 2-3 times a day. Remove the ice if your skin turns bright red. This is very important. If you cannot feel pain, heat, or cold, you have a greater risk of damage to the area. If directed, apply heat to the affected area as often as told by your health care provider. Use the heat source that your health care provider recommends, such as a moist heat pack or a heating pad. Place a towel between your skin and the heat source. Leave the heat on for 20-30 minutes. Remove the heat if your skin turns bright red. This is especially important if you are unable to feel pain, heat, or cold. You have a greater risk of getting burned. Activity  Do not stay in bed. Staying in  bed for more than 1-2 days can delay your recovery. Sit up and stand up straight. Avoid leaning forward when you sit or hunching over when you stand. If you work at a desk, sit close to it so you do not need to lean over. Keep your chin tucked in. Keep your neck drawn back, and keep your elbows bent at a 90-degree angle (right angle). Sit high and close to the steering wheel when you drive. Add lower back (lumbar) support to your car seat, if needed. Take short walks on even surfaces as soon as you are able. Try to increase the length of time you walk each day. Do not sit, drive, or stand in one place for more than 30 minutes at a time. Sitting or standing for long periods of time can put stress on your back. Do not drive or use heavy machinery while taking prescription pain medicine. Use proper lifting techniques. When you bend and lift, use positions that put less stress on your back: Bend your knees. Keep the load close to your body. Avoid twisting. Exercise regularly as told by your health care provider. Exercising helps your back heal faster and helps prevent back injuries by keeping muscles strong and flexible. Work with a physical therapist to make a safe exercise program, as recommended by your health care provider. Do any exercises as told by your physical therapist. Lifestyle Maintain a healthy weight. Extra weight puts stress on your back and makes it difficult to have good   posture. Avoid activities or situations that make you feel anxious or stressed. Stress and anxiety increase muscle tension and can make back pain worse. Learn ways to manage anxiety and stress, such as through exercise. General instructions Sleep on a firm mattress in a comfortable position. Try lying on your side with your knees slightly bent. If you lie on your back, put a pillow under your knees. Keep your head and neck in a straight line with your spine (neutral position) when using electronic equipment like  smartphones or pads. To do this: Raise your smartphone or pad to look at it instead of bending your head or neck to look down. Put the smartphone or pad at the level of your face while looking at the screen. Follow your treatment plan as told by your health care provider. This may include: Cognitive or behavioral therapy. Acupuncture or massage therapy. Meditation or yoga. Contact a health care provider if: You have pain that is not relieved with rest or medicine. You have increasing pain going down into your legs or buttocks. Your pain does not improve after 2 weeks. You have pain at night. You lose weight without trying. You have a fever or chills. You develop nausea or vomiting. You develop abdominal pain. Get help right away if: You develop new bowel or bladder control problems. You have unusual weakness or numbness in your arms or legs. You feel faint. These symptoms may represent a serious problem that is an emergency. Do not wait to see if the symptoms will go away. Get medical help right away. Call your local emergency services (911 in the [place] Do not drive yourself to the hospital. Summary Acute back pain is sudden and usually short-lived. Use proper lifting techniques. When you bend and lift, use positions that put less stress on your back. Take over-the-counter and prescription medicines only as told by your health care provider, and apply heat or ice as told. This information is not intended to replace advice given to you by your health care provider. Make sure you discuss any questions you have with your health care provider. Document Revised: 04/29/2020 Document Reviewed: 04/29/2020 [employer] Patient Education  2024 [employer]  

## 2022-11-21 ENCOUNTER — Encounter: Payer: Self-pay | Admitting: Internal Medicine

## 2022-11-21 ENCOUNTER — Ambulatory Visit: Payer: Medicaid Other | Attending: Internal Medicine | Admitting: Internal Medicine

## 2022-11-21 VITALS — BP 134/82 | HR 87 | Ht 63.0 in | Wt 167.0 lb

## 2022-11-21 DIAGNOSIS — R0602 Shortness of breath: Secondary | ICD-10-CM | POA: Diagnosis not present

## 2022-11-21 DIAGNOSIS — Z01812 Encounter for preprocedural laboratory examination: Secondary | ICD-10-CM | POA: Diagnosis not present

## 2022-11-21 DIAGNOSIS — R079 Chest pain, unspecified: Secondary | ICD-10-CM

## 2022-11-21 DIAGNOSIS — R011 Cardiac murmur, unspecified: Secondary | ICD-10-CM | POA: Diagnosis not present

## 2022-11-21 MED ORDER — METOPROLOL TARTRATE 100 MG PO TABS: 100.0000 mg | ORAL_TABLET | ORAL | 0 refills | Status: DC

## 2022-11-21 NOTE — Patient Instructions (Addendum)
Medication Instructions:  NO CHANGES  You will take a one-time dose of metoprolol tartrate 2 hours prior to CT test.  *If you need a refill on your cardiac medications before your next appointment, please call your pharmacy*   Lab Work: Non-Fasting BMET prior to CT test  If you have labs (blood work) drawn today and your tests are completely normal, you will receive your results only by: MyChart Message (if you have MyChart) OR A paper copy in the mail If you have any lab test that is abnormal or we need to change your treatment, we will call you to review the results.   Testing/Procedures: Your physician has requested that you have an echocardiogram. Echocardiography is a painless test that uses sound waves to create images of your heart. It provides your doctor with information about the size and shape of your heart and how well your heart's chambers and valves are working. This procedure takes approximately one hour. There are no restrictions for this procedure. Please do NOT wear cologne, perfume, aftershave, or lotions (deodorant is allowed). Please arrive 15 minutes prior to your appointment time.  Coronary CTA at [facility] -- you'll get a call to schedule once approved with insurance   Follow-Up: At [facility], you and your health needs are our priority.  As part of our continuing mission to provide you with exceptional heart care, we have created designated Provider Care Teams.  These Care Teams include your primary Cardiologist (physician) and Advanced Practice Providers (APPs -  Physician Assistants and Nurse Practitioners) who all work together to provide you with the care you need, when you need it.  We recommend signing up for the patient portal called "MyChart".  Sign up information is provided on this After Visit Summary.  MyChart is used to connect with patients for Virtual Visits (Telemedicine).  Patients are able to view lab/test results, encounter notes,  upcoming appointments, etc.  Non-urgent messages can be sent to your provider as well.   To learn more about what you can do with MyChart, go to [email or web address].    Your next appointment:    6-8 weeks with Dr. Rennis Golden or NP/PA for testing follow up     Your cardiac CT will be scheduled at one of the below locations:   [facility] [address] [place], [place] [address] [phone]  OR  [facility] [address] [place], [place] [address] [phone]  OR   [facility] [address] [place], [place] [address] [phone]  If scheduled at [facility], please arrive at the [facility] and [facility]'s Entrance (Entrance C2) of [facility] 30 minutes prior to test start time. You can use the FREE valet parking offered at entrance C (encouraged to control the heart rate for the test)  Proceed to the [facility] Radiology Department (first floor) to check-in and test prep.  All radiology patients and guests should use entrance C2 at [facility], accessed from [facility], even though the hospital's physical address listed is [address].    If scheduled at [facility] or [facility], please arrive 15 mins early for check-in and test prep.  There is spacious parking and easy access to the radiology department from the [facility] entrance. Please enter here and check-in with the desk attendant.   Please follow these instructions carefully (unless otherwise directed):  An IV will be required for this test and Nitroglycerin will be given.  Hold all erectile dysfunction medications at least 3 days (72 hrs) prior to test. (Ie viagra, cialis, sildenafil, tadalafil, etc)   On the Night Before the Test: Be sure to Drink plenty of water. Do not consume any caffeinated/decaffeinated beverages  or chocolate 12 hours prior to your test. Do not take any antihistamines 12 hours prior to your test.  On the Day of the Test: Drink plenty of water until 1 hour prior to the test. Do not eat any food 1 hour prior to test. You may take your regular medications prior to the test.  Take metoprolol (Lopressor) two hours prior to test. FEMALES- please wear underwire-free bra if available, avoid dresses & tight clothing      After the Test: Drink plenty of water. After receiving IV contrast, you may experience a mild flushed feeling. This is normal. On occasion, you may experience a mild rash up to 24 hours after the test. This is not dangerous. If this occurs, you can take Benadryl 25 mg and increase your fluid intake. If you experience trouble breathing, this can be serious. If it is severe call 911 IMMEDIATELY. If it is mild, please call our office.  We will call to schedule your test 2-4 weeks out understanding that some insurance companies will need an authorization prior to the service being performed.   For more information and frequently asked questions, please visit our website : [email or web address]  For non-scheduling related questions, please contact the cardiac imaging nurse navigator should you have any questions/concerns: Cardiac Imaging Nurse Navigators Direct Office Dial: [phone]   For scheduling needs, including cancellations and rescheduling, please call [place], [phone].

## 2022-11-21 NOTE — Progress Notes (Signed)
OFFICE CONSULT NOTE  Chief Complaint:  Chest pain, dyspnea  Primary Care Physician: Georganna Skeans, MD  HPI:  Mallory Robinson is a 47 y.o. female who is being seen today for the evaluation of chest pain and dyspnea at the request of Georganna Skeans, MD. This is a pleasant 47 year old female kindly referred for evaluation of chest pain and dyspnea.  She reports has been having episodes of shortness of breath and marked fatigue over the past several weeks after having COVID-19.  She has subsequently tested negative and was seen at an urgent care yesterday.  She has been having symptoms of fullness in her ears and was thought to have an effusion.  She completed a course of steroids.  She notes that she gets significantly fatigued and short of breath with minimal exertion and then this is associated with chest pain that radiates up into her neck and feels like a burning sensation.  She has not noted that at rest.  She notes that her heart races during these episodes and she has captured heart rates up to 200 on her smart watch.  She does have history of stroke back in 2019.  Echo at the time showed normal LVEF and a negative bubble study.  No clear etiology for the stroke was noted and she has been maintained on aspirin and beta-blocker.  There is also strong family history on both sides of her family of heart disease, stroke and hypertension.  PMHx:  Past Medical History:  Diagnosis Date   Bipolar 1 disorder (HCC)    Complication of anesthesia    Depression    Hypertension    Low vitamin B12 level 04/11/2019   PONV (postoperative nausea and vomiting)    Stroke [facility])     Past Surgical History:  Procedure Laterality Date   CESAREAN SECTION     X2   HERNIA REPAIR     TEE WITHOUT CARDIOVERSION N/A 11/13/2017   Procedure: TRANSESOPHAGEAL ECHOCARDIOGRAM (TEE) bubble study;  Surgeon: Chilton Si, MD;  Location: [facility] ENDOSCOPY;  Service: Cardiovascular;  Laterality: N/A;    FAMHx:  Family  History  Problem Relation Age of Onset   Hypertension Mother    Stroke Father    Stroke Maternal Aunt     SOCHx:   reports that she has never smoked. She has never used smokeless tobacco. She reports that she does not currently use alcohol. She reports that she does not use drugs.  ALLERGIES:  Allergies  Allergen Reactions   Food Itching and Swelling    Melons : Reaction to Melons (lips and eyes swelled)   Penicillins Hives, Itching and Other (See Comments)    Hair fell out Did it involve swelling of the face/tongue/throat, SOB, or low BP? No Did it involve sudden or severe rash/hives, skin peeling, or any reaction on the inside of your mouth or nose? Yes Did you need to seek medical attention at a hospital or doctor's office? In hospital reaction When did it last happen?      47 yrs old If all above answers are "NO", may proceed with cephalosporin use.    ROS: Pertinent items noted in HPI and remainder of comprehensive ROS otherwise negative.  HOME MEDS: Current Outpatient Medications on File Prior to Visit  Medication Sig Dispense Refill   aspirin EC 81 MG tablet Take 81 mg by mouth daily.     carvedilol (COREG) 6.25 MG tablet Take 1 tablet (6.25 mg total) by mouth 2 (two) times  daily with a meal. 180 tablet 1   ferrous sulfate 325 (65 FE) MG tablet Take 1 tablet (325 mg total) by mouth 2 (two) times daily with a meal. 60 tablet 5   methocarbamol (ROBAXIN) 500 MG tablet Take 1 tablet (500 mg total) by mouth 2 (two) times daily as needed for muscle spasms. 20 tablet 0   predniSONE (DELTASONE) 10 MG tablet Take 6 tablets (60 mg total) by mouth daily for 1 day, THEN 5 tablets (50 mg total) daily for 1 day, THEN 4 tablets (40 mg total) daily for 1 day, THEN 3 tablets (30 mg total) daily for 1 day, THEN 2 tablets (20 mg total) daily for 1 day, THEN 1 tablet (10 mg total) daily for 1 day. 21 tablet 0   Cholecalciferol (VITAMIN D3) 50 MCG (2000 UT) capsule Take 1 capsule (2,000 Units  total) by mouth daily. Take with meal 90 capsule 3   No current facility-administered medications on file prior to visit.    LABS/IMAGING: No results found for this or any previous visit (from the past 48 hour(s)). DG Chest 2 View  Result Date: 11/20/2022 CLINICAL DATA:  Shortness of breath. EXAM: CHEST - 2 VIEW COMPARISON:  None Available. FINDINGS: The heart size and mediastinal contours are within normal limits. Both lungs are clear. The visualized skeletal structures are unremarkable. IMPRESSION: No active cardiopulmonary disease. Electronically Signed   By: Lupita Raider M.D.   On: 11/20/2022 17:11    LIPID PANEL:    Component Value Date/Time   CHOL 114 10/15/2022 0855   TRIG 54 10/15/2022 0855   HDL 54 10/15/2022 0855   CHOLHDL 2.1 10/15/2022 0855   CHOLHDL 2.3 04/10/2019 0400   VLDL 16 04/10/2019 0400   LDLCALC 48 10/15/2022 0855    WEIGHTS: Wt Readings from Last 3 Encounters:  11/21/22 167 lb (75.8 kg)  11/20/22 170 lb (77.1 kg)  10/15/22 174 lb (78.9 kg)    VITALS: BP 134/82 (Patient Position: Sitting)   Pulse 87   Ht 5\' 3"  (1.6 m)   Wt 167 lb (75.8 kg)   LMP 11/16/2022   SpO2 99%   BMI 29.58 kg/m   EXAM: General appearance: alert and no distress Neck: no carotid bruit, no JVD, and thyroid not enlarged, symmetric, no tenderness/mass/nodules Lungs: clear to auscultation bilaterally Heart: regular rate and rhythm, S1, S2 normal, and systolic murmur: early systolic 3/6, blowing at 2nd right intercostal space Abdomen: soft, non-tender; bowel sounds normal; no masses,  no organomegaly Extremities: extremities normal, atraumatic, no cyanosis or edema Pulses: 2+ and symmetric Skin: Skin color, texture, turgor normal. No rashes or lesions Neurologic: Grossly normal Psych: Pleasant  EKG: EKG from urgent care on 11/20/2022 personally reviewed-normal sinus rhythm at 84  ASSESSMENT: Progressive chest pain and dyspnea on exertion Recent COVID-19  infection History of stroke HTN  PLAN: 1.   Ms. Labus has multiple cardiovascular risk factors but also had recent COVID-19 infection.  She has had this number of times and had steroids for it.  She has had some progressive dyspnea on exertion over the past several weeks including feelings of her heart racing and chest tightness which is a burning quality that radiates to her left jaw.  This sounds anginal in nature although I would have a lower suspicion based on her age of coronary disease however she has a strong family history of heart disease and has had prior stroke.  I would like her to have a coronary CT angiogram  to rule out any obstructive coronary disease.  Will also get an echocardiogram.  She has a louder murmur on exam today and only had trivial TR and MR on echo back in 2020.  Will follow-up on those results.  It may be that she is having arrhythmia as well.  These also could be long COVID symptoms.  Follow-up with me in a few weeks.  Thanks again for the kind referral.  Chrystie Nose, MD, [facility]  [place]  [facility]  Medical Director of the Advanced Lipid Disorders &  Cardiovascular Risk Reduction Clinic Diplomate of the American Board of Clinical Lipidology Attending Cardiologist  Direct Dial: [phone]  Fax: [phone]  Website:  [email or web address] Nicely Gricel Copen 11/21/2022, 9:06 AM

## 2022-11-22 LAB — BASIC METABOLIC PANEL
BUN/Creatinine Ratio: 8 — ABNORMAL LOW (ref 9–23)
BUN: 6 mg/dL (ref 6–24)
CO2: 24 mmol/L (ref 20–29)
Calcium: 9.8 mg/dL (ref 8.7–10.2)
Chloride: 102 mmol/L (ref 96–106)
Creatinine, Ser: 0.76 mg/dL (ref 0.57–1.00)
Glucose: 97 mg/dL (ref 70–99)
Potassium: 4.9 mmol/L (ref 3.5–5.2)
Sodium: 137 mmol/L (ref 134–144)
eGFR: 97 mL/min/{1.73_m2} (ref >59)

## 2022-11-26 ENCOUNTER — Encounter (HOSPITAL_COMMUNITY): Payer: Self-pay

## 2022-11-27 ENCOUNTER — Telehealth (HOSPITAL_COMMUNITY): Payer: Self-pay | Admitting: *Deleted

## 2022-11-27 NOTE — Telephone Encounter (Signed)
Reaching out to patient to offer assistance regarding upcoming cardiac imaging study; pt verbalizes understanding of appt date/time, parking situation and where to check in, pre-test NPO status and medications ordered, and verified current allergies; name and call back number provided for further questions should they arise  Gordy Clement RN Navigator Cardiac Imaging [facility] [phone] office [phone] cell  Patient to take '100mg'$  metoprolol tartrate two hours prior to her cardiac CT scan. She is aware to arrive at [facility].

## 2022-11-28 ENCOUNTER — Encounter (HOSPITAL_COMMUNITY): Payer: Self-pay

## 2022-11-28 ENCOUNTER — Ambulatory Visit (HOSPITAL_COMMUNITY)
Admission: RE | Admit: 2022-11-28 | Discharge: 2022-11-28 | Disposition: A | Discharge status: Home / Self Care | Payer: Medicaid Other | Source: Ambulatory Visit | Attending: Internal Medicine | Admitting: Internal Medicine

## 2022-11-28 DIAGNOSIS — R079 Chest pain, unspecified: Secondary | ICD-10-CM | POA: Diagnosis present

## 2022-11-28 DIAGNOSIS — R0602 Shortness of breath: Secondary | ICD-10-CM | POA: Diagnosis not present

## 2022-11-28 DIAGNOSIS — R072 Precordial pain: Secondary | ICD-10-CM

## 2022-11-28 MED ORDER — IOHEXOL 350 MG/ML SOLN
100.0000 mL | Freq: Once | INTRAVENOUS | Status: AC | PRN
  Administered 2022-11-28: 100 mL via INTRAVENOUS

## 2022-11-28 MED ORDER — NITROGLYCERIN 0.4 MG SL SUBL
0.8000 mg | SUBLINGUAL_TABLET | Freq: Once | SUBLINGUAL | Status: AC
  Administered 2022-11-28: 0.8 mg via SUBLINGUAL

## 2022-11-28 MED ORDER — NITROGLYCERIN 0.4 MG SL SUBL
SUBLINGUAL_TABLET | SUBLINGUAL | Status: AC
  Filled 2022-11-28: qty 2

## 2022-12-01 NOTE — Telephone Encounter (Signed)
Patient reported had follow up with her provider for elevated heart rate post covid infection.  Echocardiogram completed and waiting on results.  Denied concerns at this time not having symptoms at work today.  She will follow up with PCM/cardiology as scheduled.

## 2022-12-11 ENCOUNTER — Ambulatory Visit (HOSPITAL_COMMUNITY): Payer: Medicaid Other | Attending: Cardiology

## 2022-12-11 DIAGNOSIS — R079 Chest pain, unspecified: Secondary | ICD-10-CM | POA: Diagnosis not present

## 2022-12-11 DIAGNOSIS — R011 Cardiac murmur, unspecified: Secondary | ICD-10-CM | POA: Diagnosis not present

## 2022-12-11 DIAGNOSIS — R0602 Shortness of breath: Secondary | ICD-10-CM | POA: Diagnosis not present

## 2022-12-11 LAB — ECHOCARDIOGRAM COMPLETE
Area-P 1/2: 3.81 cm2
S' Lateral: 2.7 cm

## 2022-12-27 ENCOUNTER — Encounter: Payer: Self-pay | Admitting: Hematology and Oncology

## 2023-01-01 ENCOUNTER — Ambulatory Visit: Payer: Self-pay | Admitting: Registered Nurse

## 2023-01-01 ENCOUNTER — Encounter: Payer: Self-pay | Admitting: Registered Nurse

## 2023-01-01 VITALS — Temp 98.2°F | Resp 16

## 2023-01-01 DIAGNOSIS — H6993 Unspecified Eustachian tube disorder, bilateral: Secondary | ICD-10-CM

## 2023-01-01 NOTE — Patient Instructions (Addendum)
How to Perform a Sinus Rinse A sinus rinse is a home treatment that is used to rinse your sinuses with a germ-free (sterile) mixture of salt and water (saline solution). Sinuses are air-filled spaces in your skull that are behind the bones of your face and forehead. They open into your nasal cavity. A sinus rinse can help to clear mucus, dirt, dust, or pollen from your nasal cavity. You may do a sinus rinse when you have a cold, a virus, nasal allergy symptoms, a sinus infection, or stuffiness in your nose or sinuses. What are the risks? A sinus rinse is generally safe and effective. However, there are a few risks, which include: A burning sensation in your sinuses. This may happen if you do not make the saline solution as directed. Be sure to follow all directions when making the saline solution. Nasal irritation. Infection. This may be from unclean supplies or from contaminated water. Infection from contaminated water is rare, but possible. Do not do a sinus rinse if you have had ear or nasal surgery, ear infection, or plugged ears, unless recommended by your health care provider. Supplies needed: Saline solution or powder. Distilled or sterile water to mix with saline powder. You may use boiled and cooled tap water. Boil tap water for 5 minutes; cool until it is lukewarm. Use within 24 hours. Do not use regular tap water to mix with the saline solution. Neti pot or nasal rinse bottle. These supplies release the saline solution into your nose and through your sinuses. Neti pots and nasal rinse bottles can be purchased at [employer], a health food store, or online. How to perform a sinus rinse  Wash your hands with soap and water for at least 20 seconds. If soap and water are not available, use hand sanitizer. Wash your device according to the directions that came with the product and then dry it. Use the solution that comes with your product or one that is sold separately in stores.  Follow the mixing directions on the package to mix with sterile or distilled water. Fill the device with the amount of saline solution noted in the device instructions. Stand by a sink and tilt your head sideways over the sink. Place the spout of the device in your upper nostril (the one closer to the ceiling). Gently pour or squeeze the saline solution into your nasal cavity. The liquid should drain out from the lower nostril if you are not too congested. While rinsing, breathe through your open mouth. Gently blow your nose to clear any mucus and rinse solution. Blowing too hard may cause ear pain. Turn your head in the other direction and repeat in your other nostril. Clean and rinse your device with clean water and then air-dry it. Talk with your health care provider or pharmacist if you have questions about how to do a sinus rinse. Summary A sinus rinse is a home treatment that is used to rinse your sinuses with a sterile mixture of salt and water (saline solution). You may do a sinus rinse when you have a cold, a virus, nasal allergy symptoms, a sinus infection, or stuffiness in your nose or sinuses. A sinus rinse is generally safe and effective. Follow all instructions carefully. This information is not intended to replace advice given to you by your health care provider. Make sure you discuss any questions you have with your health care provider. Document Revised: 07/25/2020 Document Reviewed: 07/25/2020 Elsevier Patient Education  2024 [employer]. Allergic  Rhinitis, Adult  Allergic rhinitis is an allergic reaction that affects the mucous membrane inside the nose. The mucous membrane is the tissue that produces mucus. There are two types of allergic rhinitis: Seasonal. This type is also called hay fever and happens only during certain seasons. Perennial. This type can happen at any time of the year. Allergic rhinitis cannot be spread from person to person. This condition can be mild,  bad, or very bad. It can develop at any age and may be outgrown. What are the causes? This condition is caused by allergens. These are things that can cause an allergic reaction. Allergens may differ for seasonal allergic rhinitis and perennial allergic rhinitis. Seasonal allergic rhinitis is caused by pollen. Pollen can come from grasses, trees, and weeds. Perennial allergic rhinitis may be caused by: Dust mites. Proteins in a pet's pee (urine), saliva, or dander. Dander is dead skin cells from a pet. Smoke, mold, or car fumes. Remains of or waste from insects such as cockroaches. What increases the risk? You are more likely to develop this condition if you have a family history of allergies or other conditions related to allergies, including: Allergic conjunctivitis. This is irritation and swelling of parts of the eyes and eyelids. Asthma. This condition affects the lungs and makes it hard to breathe. Atopic dermatitis or eczema. This is long term (chronic) irritation and swelling of the skin. Food allergies. What are the signs or symptoms? Symptoms of this condition include: Sneezing or coughing. A stuffy nose (nasal congestion), itchy nose, or nasal discharge. Itchy eyes and tearing of the eyes. A feeling of mucus dripping down the back of your throat (postnasal drip). This may cause a sore throat. Trouble sleeping. Tiredness. Headache. How is this diagnosed? This condition may be diagnosed with your symptoms, your medical history, and a physical exam. Your health care provider may check for related conditions, such as: Asthma. Pink eye. This is eye swelling and irritation caused by infection (conjunctivitis). Ear infection. Upper respiratory infection. This is an infection in the nose, throat, or upper airways. You may also have tests to find out which allergens cause your symptoms. These may include skin tests or blood tests. How is this treated? There is no cure for this  condition, but treatment can help control symptoms. Treatment may include: Taking medicines that block allergy symptoms, such as corticosteroids (anti-inflammatories) and antihistamines. Medicine may be given as a shot, nasal spray, or pill. Avoiding any allergens. Being exposed again and again to tiny amounts of allergens to help you build a defense against allergens (allergenimmunotherapy). This is done if other treatments have not helped. It may include: Allergy shots. These are injected medicines that have small amounts of an allergen in them. Sublingual immunotherapy. This involves taking small doses of a medicine with an allergen in it under your tongue. If these treatments do not work, your provider may prescribe newer, stronger medicines. Follow these instructions at home: Avoiding allergens Find out what you are allergic to and avoid those allergens. These are some things you can do to help avoid allergens: If you have perennial allergies: Replace carpet with wood, tile, or vinyl flooring. Carpet can trap dander and dust. Do not smoke. Do not allow smoking in your home Change your heating and air conditioning filters at least once a month. If you have seasonal allergies, take these steps during allergy season: Keep windows closed as much as possible. Plan outdoor activities when pollen counts are lowest. Check pollen counts before  you plan outdoor activities When coming indoors, change clothing and shower before sitting on furniture or bedding. If you have a pet in the house that produces allergens: Keep the pet out of the bedroom. Vacuum, sweep, and dust regularly. General instructions Take over-the-counter and prescription medicines only as told by your provider. Drink enough fluid to keep your pee pale yellow. Where to find more information American Academy of Allergy, Asthma & Immunology: aaaai.org Contact a health care provider if: You have a fever. You develop a cough that  does not go away. You make high-pitched whistling sounds when you breathe, most often when you breathe out (wheeze). Your symptoms slow you down or stop you from doing your normal activities each day. Get help right away if: You have shortness of breath. This symptom may be an emergency. Get help right away. Call 911. Do not wait to see if the symptoms will go away. Do not drive yourself to the hospital. This information is not intended to replace advice given to you by your health care provider. Make sure you discuss any questions you have with your health care provider. Document Revised: 10/16/2021 Document Reviewed: 10/16/2021 Elsevier Patient Education  2024 [employer] Eustachian Tube Dysfunction  Eustachian tube dysfunction refers to a condition in which a blockage develops in the narrow passage that connects the middle ear to the back of the nose (eustachian tube). The eustachian tube regulates air pressure in the middle ear by letting air move between the ear and nose. It also helps to drain fluid from the middle ear space. Eustachian tube dysfunction can affect one or both ears. When the eustachian tube does not function properly, air pressure, fluid, or both can build up in the middle ear. What are the causes? This condition occurs when the eustachian tube becomes blocked or cannot open normally. Common causes of this condition include: Ear infections. Colds and other infections that affect the nose, mouth, and throat (upper respiratory tract). Allergies. Irritation from cigarette smoke. Irritation from stomach acid coming up into the esophagus (gastroesophageal reflux). The esophagus is the part of the body that moves food from the mouth to the stomach. Sudden changes in air pressure, such as from descending in an airplane or scuba diving. Abnormal growths in the nose or throat, such as: Growths that line the nose (nasal polyps). Abnormal growth of cells (tumors). Enlarged  tissue at the back of the throat (adenoids). What increases the risk? You are more likely to develop this condition if: You smoke. You are overweight. You are a child who has: Certain birth defects of the mouth, such as cleft palate. Large tonsils or adenoids. What are the signs or symptoms? Common symptoms of this condition include: A feeling of fullness in the ear. Ear pain. Clicking or popping noises in the ear. Ringing in the ear (tinnitus). Hearing loss. Loss of balance. Dizziness. Symptoms may get worse when the air pressure around you changes, such as when you travel to an area of high elevation, fly on an airplane, or go scuba diving. How is this diagnosed? This condition may be diagnosed based on: Your symptoms. A physical exam of your ears, nose, and throat. Tests, such as those that measure: The movement of your eardrum. Your hearing (audiometry). How is this treated? Treatment depends on the cause and severity of your condition. In mild cases, you may relieve your symptoms by moving air into your ears. This is called "popping the ears." In more severe cases, or if  you have symptoms of fluid in your ears, treatment may include: Medicines to relieve congestion (decongestants). Medicines that treat allergies (antihistamines). Nasal sprays or ear drops that contain medicines that reduce swelling (steroids). A procedure to drain the fluid in your eardrum. In this procedure, a small tube may be placed in the eardrum to: Drain the fluid. Restore the air in the middle ear space. A procedure to insert a balloon device through the nose to inflate the opening of the eustachian tube (balloon dilation). Follow these instructions at home: Lifestyle Do not do any of the following until your health care provider approves: Travel to high altitudes. Fly in airplanes. Work in a Estate agent or room. Scuba dive. Do not use any products that contain nicotine or tobacco. These  products include cigarettes, chewing tobacco, and vaping devices, such as e-cigarettes. If you need help quitting, ask your health care provider. Keep your ears dry. Wear fitted earplugs during showering and bathing. Dry your ears completely after. General instructions Take over-the-counter and prescription medicines only as told by your health care provider. Use techniques to help pop your ears as recommended by your health care provider. These may include: Chewing gum. Yawning. Frequent, forceful swallowing. Closing your mouth, holding your nose closed, and gently blowing as if you are trying to blow air out of your nose. Keep all follow-up visits. This is important. Contact a health care provider if: Your symptoms do not go away after treatment. Your symptoms come back after treatment. You are unable to pop your ears. You have: A fever. Pain in your ear. Pain in your head or neck. Fluid draining from your ear. Your hearing suddenly changes. You become very dizzy. You lose your balance. Get help right away if: You have a sudden, severe increase in any of your symptoms. Summary Eustachian tube dysfunction refers to a condition in which a blockage develops in the eustachian tube. It can be caused by ear infections, allergies, inhaled irritants, or abnormal growths in the nose or throat. Symptoms may include ear pain or fullness, hearing loss, or ringing in the ears. Mild cases are treated with techniques to unblock the ears, such as yawning or chewing gum. More severe cases are treated with medicines or procedures. This information is not intended to replace advice given to you by your health care provider. Make sure you discuss any questions you have with your health care provider. Document Revised: 04/18/2020 Document Reviewed: 04/18/2020 Elsevier Patient Education  2024 [employer].

## 2023-01-01 NOTE — Progress Notes (Signed)
Subjective:    Patient ID: Mallory Robinson, female    DOB: 1975/02/21, 47 y.o.   MRN: [id number]  47y/o african [place] established female here for evaluation hearing water in right ear doesn't feel right wondering if she has ear infection.  Denied discharge/bleeding/decreased hearing/fever/chills/n/v/headache.  Has had some post nasal drip and nasal congestion doesn't feel sick.  Has not home covid tested.  Denied known sick contacts.      Review of Systems  Constitutional:  Negative for chills, diaphoresis, fatigue, fever and unexpected weight change.  HENT:  Positive for congestion and postnasal drip. Negative for dental problem, drooling, ear discharge, ear pain, facial swelling, hearing loss, mouth sores, nosebleeds, rhinorrhea, sinus pressure, sinus pain, sneezing, sore throat, tinnitus, trouble swallowing and voice change.   Eyes:  Negative for photophobia, pain, discharge, redness, itching and visual disturbance.  Respiratory:  Negative for cough, choking, chest tightness, shortness of breath, wheezing and stridor.   Gastrointestinal:  Negative for diarrhea, nausea and vomiting.  Genitourinary:  Negative for difficulty urinating.  Musculoskeletal:  Negative for gait problem, neck pain and neck stiffness.  Skin:  Negative for color change, pallor, rash and wound.  Allergic/Immunologic: Positive for environmental allergies.  Neurological:  Negative for dizziness, tremors, seizures, syncope, facial asymmetry, speech difficulty, weakness, light-headedness, numbness and headaches.  Hematological:  Negative for adenopathy. Does not bruise/bleed easily.  Psychiatric/Behavioral:  Negative for agitation, behavioral problems, confusion and sleep disturbance.        Objective:   Physical Exam Vitals reviewed.  Constitutional:      General: She is awake. She is not in acute distress.    Appearance: Normal appearance. She is well-developed and well-groomed. She is not ill-appearing,  toxic-appearing or diaphoretic.  HENT:     Head: Normocephalic and atraumatic.     Jaw: There is normal jaw occlusion. No trismus.     Salivary Glands: Right salivary gland is not diffusely enlarged or tender. Left salivary gland is not diffusely enlarged or tender.     Comments: Bilateral TMs intact air fluid level clear; no debris noted in auditory canals    Right Ear: Hearing, ear canal and external ear normal. No decreased hearing noted. No laceration, drainage, swelling or tenderness. A middle ear effusion is present. There is no impacted cerumen. No foreign body. No mastoid tenderness. No PE tube. No hemotympanum. Tympanic membrane is not injected, scarred, perforated, erythematous, retracted or bulging.     Left Ear: Hearing, ear canal and external ear normal. No decreased hearing noted. No laceration, drainage, swelling or tenderness. A middle ear effusion is present. There is no impacted cerumen. No foreign body. No mastoid tenderness. No PE tube. No hemotympanum. Tympanic membrane is not injected, scarred, perforated, erythematous, retracted or bulging.     Ears:     Comments: Bilateral TMs intact air fluid level clear no debris noted in auditory canals    Nose: Mucosal edema, congestion and rhinorrhea present. No nasal deformity, septal deviation or laceration. Rhinorrhea is clear.     Right Turbinates: Not enlarged, swollen or pale.     Left Turbinates: Not enlarged, swollen or pale.     Right Sinus: No maxillary sinus tenderness or frontal sinus tenderness.     Left Sinus: No maxillary sinus tenderness or frontal sinus tenderness.     Mouth/Throat:     Lips: Pink. No lesions.     Mouth: Mucous membranes are moist. Mucous membranes are not pale, not dry and not cyanotic. No lacerations,  oral lesions or angioedema.     Dentition: No dental abscesses or gum lesions.     Tongue: No lesions.     Palate: No mass.     Pharynx: Uvula midline. Pharyngeal swelling, posterior oropharyngeal  erythema and postnasal drip present. No oropharyngeal exudate or uvula swelling.     Tonsils: No tonsillar exudate or tonsillar abscesses. 1+ on the right. 1+ on the left.     Comments: Cobblestoning posterior pharynx; bilateral allergic shiners; clear discharge bilateral nasal turbinates; tonsils enlarged without exudate/erythema 1+/4 cryptic Eyes:     General: Lids are normal. Vision grossly intact. Gaze aligned appropriately. Allergic shiner present. No scleral icterus.       Right eye: No foreign body, discharge or hordeolum.        Left eye: No foreign body, discharge or hordeolum.     Extraocular Movements: Extraocular movements intact.     Right eye: Normal extraocular motion and no nystagmus.     Left eye: Normal extraocular motion and no nystagmus.     Conjunctiva/sclera: Conjunctivae normal.     Right eye: Right conjunctiva is not injected. No chemosis, exudate or hemorrhage.    Left eye: Left conjunctiva is not injected. No chemosis, exudate or hemorrhage.    Pupils: Pupils are equal, round, and reactive to light. Pupils are equal.     Right eye: Pupil is round and reactive.     Left eye: Pupil is round and reactive.  Neck:     Thyroid: No thyroid mass or thyromegaly.     Trachea: Trachea and phonation normal. No tracheal tenderness or tracheal deviation.  Cardiovascular:     Rate and Rhythm: Normal rate and regular rhythm.     Pulses:          Radial pulses are 2+ on the right side and 2+ on the left side.  Pulmonary:     Effort: Pulmonary effort is normal. No accessory muscle usage or respiratory distress.     Breath sounds: Normal breath sounds and air entry. No stridor, decreased air movement or transmitted upper airway sounds. No decreased breath sounds, wheezing, rhonchi or rales.     Comments: No cough observed in exam room; spoke full sentences without difficulty Chest:     Chest wall: No tenderness.  Abdominal:     General: There is no distension.     Palpations:  Abdomen is soft.  Musculoskeletal:        General: Normal range of motion.     Right hand: Normal strength. Normal capillary refill.     Left hand: Normal strength. Normal capillary refill.     Cervical back: Normal range of motion and neck supple. No edema, erythema, signs of trauma, rigidity, torticollis, tenderness or crepitus. No pain with movement. Normal range of motion.     Right hip: Normal.     Left hip: Normal.     Right knee: Normal.     Left knee: Normal.     Right lower leg: No edema.     Left lower leg: No edema.  Lymphadenopathy:     Head:     Right side of head: No submental, submandibular, tonsillar, preauricular, posterior auricular or occipital adenopathy.     Left side of head: No submental, submandibular, tonsillar, preauricular, posterior auricular or occipital adenopathy.     Cervical: No cervical adenopathy.     Right cervical: No superficial, deep or posterior cervical adenopathy.    Left cervical: No superficial, deep or  posterior cervical adenopathy.  Skin:    General: Skin is warm and dry.     Capillary Refill: Capillary refill takes less than 2 seconds.     Coloration: Skin is not ashen, cyanotic, jaundiced, mottled, pale or sallow.     Findings: No abrasion, abscess, acne, bruising, burn, ecchymosis, erythema, signs of injury, laceration, lesion, petechiae, rash or wound.     Nails: There is no clubbing.  Neurological:     General: No focal deficit present.     Mental Status: She is alert and oriented to person, place, and time. Mental status is at baseline. She is not disoriented.     GCS: GCS eye subscore is 4. GCS verbal subscore is 5. GCS motor subscore is 6.     Cranial Nerves: No cranial nerve deficit, dysarthria or facial asymmetry.     Sensory: No sensory deficit.     Motor: Motor function is intact. No weakness, tremor, atrophy, abnormal muscle tone or seizure activity.     Coordination: Coordination is intact. Coordination normal.     Gait:  Gait is intact. Gait normal.     Comments: On/off exam table without difficulty; gait sure and steady in clinic; bilateral hand grasp equal 5/5  Psychiatric:        Attention and Perception: Attention and perception normal.        Mood and Affect: Mood and affect normal.        Speech: Speech normal.        Behavior: Behavior normal. Behavior is cooperative.        Thought Content: Thought content normal.        Cognition and Memory: Cognition and memory normal.        Judgment: Judgment normal.           Assessment & Plan:   A-eustachian tube dysfunction bilateral, allergic rhinitis  P- No evidence of invasive bacterial infection, non toxic and well hydrated.  I do not see where any further testing or imaging is necessary at this time.   I will suggest supportive care, rest, good hygiene and encourage the patient to take adequate fluids.  The patient is to return to clinic or EMERGENCY ROOM if symptoms worsen or change significantly e.g. ear pain, fever, purulent discharge from ears or bleeding.  Exitcare handout on eustachian tube dysfunction.  Discussed with patient post nasal drip irritates throat/causes swelling blocks eustachian tubes from draining and fluid fills up middle ear.  Bacteria/viruses can grow in fluid and with moving head tube compressed and increases pressure in tube/ear worsening pain.  Studies show will take 30 days for fluid to resolve after post nasal drip controlled with nasal steroid/antihistamine. Antibiotics and steroids do not speed up fluid removal.  Patient verbalized agreement and understanding of treatment plan and had no further questions at this time.   Patient may use normal saline nasal spray 2 sprays each nostril q2h wa as needed. flonase 1 spray each nostril BID.  Patient denied personal or family history of ENT cancer.  Avoid triggers if possible.  Shower prior to bedtime if exposed to triggers.  If allergic dust/dust mites recommend  mattress/pillow covers/encasements; washing linens, vacuuming, sweeping, dusting weekly.  Call or return to clinic as needed if these symptoms worsen or fail to improve as anticipated.   Exitcare handout on allergic rhinitis and sinus rinse.  Patient verbalized understanding of instructions, agreed with plan of care and had no further questions at this time.  P2:  Avoidance and hand washing.

## 2023-01-01 NOTE — Progress Notes (Unsigned)
Cardiology Clinic Note   Patient Name: Mallory Robinson Date of Encounter: 01/03/2023  Primary Care Provider:  Christell Constant, MD Primary Cardiologist:  None  Patient Profile    Mallory Robinson 47 year old female presents to the clinic today for follow-up evaluation of her hypertension and OSA.  Past Medical History    Past Medical History:  Diagnosis Date   Bipolar 1 disorder (HCC)    Complication of anesthesia    Depression    Hypertension    Low vitamin B12 level 04/11/2019   PONV (postoperative nausea and vomiting)    Stroke [facility])    Past Surgical History:  Procedure Laterality Date   CESAREAN SECTION     X2   HERNIA REPAIR     TEE WITHOUT CARDIOVERSION N/A 11/13/2017   Procedure: TRANSESOPHAGEAL ECHOCARDIOGRAM (TEE) bubble study;  Surgeon: Chilton Si, MD;  Location: [facility] ENDOSCOPY;  Service: Cardiovascular;  Laterality: N/A;    Allergies  Allergies  Allergen Reactions   Food Itching and Swelling    Melons : Reaction to Melons (lips and eyes swelled)   Penicillins Hives, Itching and Other (See Comments)    Hair fell out Did it involve swelling of the face/tongue/throat, SOB, or low BP? No Did it involve sudden or severe rash/hives, skin peeling, or any reaction on the inside of your mouth or nose? Yes Did you need to seek medical attention at a hospital or doctor's office? In hospital reaction When did it last happen?      47 yrs old If all above answers are "NO", may proceed with cephalosporin use.    History of Present Illness    Mallory Robinson has a PMH of OSA, hypertension, CVA, and chest discomfort.  She was referred to Dr. Rennis Golden for evaluation of her chest pain and dyspnea by her PCP.  She reported episodes of shortness of breath and increased fatigue over several weeks after having COVID-19.  She was subsequently seen in the urgent care and tested negative for COVID-19.  She reported symptoms of fullness in her ears.  This was thought to be  related to effusion.  She had completed a course of steroids.  She noted significant fatigue with minimal exertion and a burning type sensation.  Her breathing at rest was normal.  She also had episodes of heart racing.  The elevated heart rate was captured on her smart watch and showed rates up to 200.  Her echocardiogram from 2019 showed normal LV function.  There was no clear etiology for her previous stroke.  She was maintained on beta-blocker therapy and aspirin.  She reported a family history of stroke and hypertension.  Coronary CTA and echocardiogram were ordered.  She underwent coronary CTA on 11/28/2022.  She was noted to have normal coronary origin with right dominance.  Her RCA showed noncalcified plaque 0-24% in her proximal RCA, her circumflex artery and LAD showed no plaque.  Her coronary calcium score was 0.  She presents to the clinic today for follow-up evaluation and states she continues to notice fatigue and increased short of breath with increased physical activity.  We reviewed her echocardiogram and coronary CTA.  She expressed understanding.  I recommended that she continue her low-sodium diet, continue to monitor blood pressure and we will plan follow-up in 9 to 12 months.  Today she denies chest pain,  lower extremity edema, melena, hematuria, hemoptysis, diaphoresis, weakness, presyncope, syncope, orthopnea, and PND.     Home Medications  Prior to Admission medications   Medication Sig Start Date End Date Taking? Authorizing Provider  aspirin EC 81 MG tablet Take 81 mg by mouth daily.    [provider]  carvedilol (COREG) 6.25 MG tablet Take 1 tablet (6.25 mg total) by mouth 2 (two) times daily with a meal. 10/24/22   Georganna Skeans, MD  Cholecalciferol (VITAMIN D3) 50 MCG (2000 UT) capsule Take 1 capsule (2,000 Units total) by mouth daily. Take with meal 10/19/22   Betancourt, Jarold Song, NP  ferrous sulfate 325 (65 FE) MG tablet Take 1 tablet (325 mg total) by mouth  2 (two) times daily with a meal. 05/01/22   Georganna Skeans, MD  methocarbamol (ROBAXIN) 500 MG tablet Take 1 tablet (500 mg total) by mouth 2 (two) times daily as needed for muscle spasms. 11/20/22   Gustavus Bryant, FNP  metoprolol tartrate (LOPRESSOR) 100 MG tablet Take 1 tablet (100 mg total) by mouth as directed. TAKE TWO HOURS PRIOR TO CT TEST 11/21/22 02/19/23  Chrystie Nose, MD    Family History    Family History  Problem Relation Age of Onset   Hypertension Mother    Stroke Father    Stroke Maternal Aunt    She indicated that her mother is alive. She indicated that her father is deceased. She indicated that her maternal aunt is deceased.  Social History    Social History   Socioeconomic History   Marital status: Married    Spouse name: Not on file   Number of children: Not on file   Years of education: Not on file   Highest education level: Some college, no degree  Occupational History   Not on file  Tobacco Use   Smoking status: Never   Smokeless tobacco: Never  Vaping Use   Vaping status: Never Used  Substance and Sexual Activity   Alcohol use: Not Currently    Comment: none in 2 years -06/08/2021   Drug use: No   Sexual activity: Yes    Birth control/protection: None  Other Topics Concern   Not on file  Social History Narrative   Not on file   Social Determinants of Health   Financial Resource Strain: Medium Risk (05/28/2022)   Overall Financial Resource Strain (CARDIA)    Difficulty of Paying Living Expenses: Somewhat hard  Food Insecurity: Food Insecurity Present (05/28/2022)   Hunger Vital Sign    Worried About Running Out of Food in the Last Year: Sometimes true    Ran Out of Food in the Last Year: Sometimes true  Transportation Needs: No Transportation Needs (05/28/2022)   PRAPARE - Administrator, Civil Service (Medical): No    Lack of Transportation (Non-Medical): No  Physical Activity: Unknown (05/28/2022)   Exercise Vital Sign    Days of  Exercise per Week: 0 days    Minutes of Exercise per Session: Not on file  Stress: Stress Concern Present (05/28/2022)   Mallory Robinson [employer] - Occupational Stress Questionnaire    Feeling of Stress : To some extent  Social Connections: Socially Isolated (05/28/2022)   Social Connection and Isolation Panel [NHANES]    Frequency of Communication with Friends and Family: Twice a week    Frequency of Social Gatherings with Friends and Family: Never    Attends Religious Services: Never    Database administrator or Organizations: No    Attends Engineer, structural: Not on file    Marital Status: Married  Intimate Partner Violence: Low Risk  (08/30/2020)   Received from [facility]   Intimate Partner Violence    Insults You: Not on file    Threatens You: Not on file    Screams at You: Not on file    Physically Hurt: Not on file    Intimate Partner Violence Score: Not on file     Review of Systems    General:  No chills, fever, night sweats or weight changes.  Cardiovascular:  No chest pain, dyspnea on exertion, edema, orthopnea, palpitations, paroxysmal nocturnal dyspnea. Dermatological: No rash, lesions/masses Respiratory: No cough, dyspnea Urologic: No hematuria, dysuria Abdominal:   No nausea, vomiting, diarrhea, bright red blood per rectum, melena, or hematemesis Neurologic:  No visual changes, wkns, changes in mental status. All other systems reviewed and are otherwise negative except as noted above.  Physical Exam    VS:  BP 128/82 (BP Location: Left Arm, Patient Position: Sitting, Cuff Size: Normal)   Pulse 86   Ht 5\' 3"  (1.6 m)   Wt 162 lb (73.5 kg)   BMI 28.70 kg/m  , BMI Body mass index is 28.7 kg/m. GEN: Well nourished, well developed, in no acute distress. HEENT: normal. Neck: Supple, no JVD, carotid bruits, or masses. Cardiac: RRR, no murmurs, rubs, or gallops. No clubbing, cyanosis, edema.  Radials/DP/PT 2+ and equal  bilaterally.  Respiratory:  Respirations regular and unlabored, clear to auscultation bilaterally. GI: Soft, nontender, nondistended, BS + x 4. MS: no deformity or atrophy. Skin: warm and dry, no rash. Neuro:  Strength and sensation are intact. Psych: Normal affect.  Accessory Clinical Findings    Recent Labs: 10/15/2022: ALT 6; Hemoglobin 7.7; Platelets 229; TSH 1.420 11/21/2022: BUN 6; Creatinine, Ser 0.76; Potassium 4.9; Sodium 137   Recent Lipid Panel    Component Value Date/Time   CHOL 114 10/15/2022 0855   TRIG 54 10/15/2022 0855   HDL 54 10/15/2022 0855   CHOLHDL 2.1 10/15/2022 0855   CHOLHDL 2.3 04/10/2019 0400   VLDL 16 04/10/2019 0400   LDLCALC 48 10/15/2022 0855         ECG personally reviewed by me today-none today.    Echocardiogram 12/11/2022 IMPRESSIONS     1. Left ventricular ejection fraction, by estimation, is 60 to 65%. Left  ventricular ejection fraction by 3D volume is 63 %. The left ventricle has  normal function. The left ventricle has no regional wall motion  abnormalities. There is mild left  ventricular hypertrophy. Left ventricular diastolic parameters were  normal. The average left ventricular global longitudinal strain is 22.5 %.  The global longitudinal strain is normal.   2. Right ventricular systolic function is normal. The right ventricular  size is normal. There is normal pulmonary artery systolic pressure.   3. Left atrial size was mildly dilated.   4. The mitral valve is abnormal. No evidence of mitral valve  regurgitation. No evidence of mitral stenosis. There is mild late systolic  prolapse of the middle segment of the anterior leaflet of the mitral  valve.   5. The aortic valve is tricuspid. Aortic valve regurgitation is trivial.  No aortic stenosis is present.   6. Mildly dilated pulmonary artery.   7. The inferior vena cava is normal in size with greater than 50%  respiratory variability, suggesting right atrial pressure of 3  mmHg.   8. Cannot exclude a small PFO.   Comparison(s): No significant change from prior study. Prior images  reviewed side by side.  FINDINGS   Left Ventricle: Left ventricular ejection fraction, by estimation, is 60  to 65%. Left ventricular ejection fraction by 3D volume is 63 %. The left  ventricle has normal function. The left ventricle has no regional wall  motion abnormalities. The average  left ventricular global longitudinal strain is 22.5 %. The global  longitudinal strain is normal. 3D ejection fraction reviewed and evaluated  as part of the interpretation. Alternate measurement of EF is felt to be  most reflective of LV function. The left   ventricular internal cavity size was normal in size. There is mild left  ventricular hypertrophy. Left ventricular diastolic parameters were  normal.   Right Ventricle: The right ventricular size is normal. No increase in  right ventricular wall thickness. Right ventricular systolic function is  normal. There is normal pulmonary artery systolic pressure. The tricuspid  regurgitant velocity is 2.49 m/s, and   with an assumed right atrial pressure of 3 mmHg, the estimated right  ventricular systolic pressure is 27.8 mmHg.   Left Atrium: Left atrial size was mildly dilated.   Right Atrium: Right atrial size was normal in size.   Pericardium: There is no evidence of pericardial effusion.   Mitral Valve: The mitral valve is abnormal. There is mild late systolic  prolapse of the middle segment of the anterior leaflet of the mitral  valve. No evidence of mitral valve regurgitation. No evidence of mitral  valve stenosis.   Tricuspid Valve: The tricuspid valve is normal in structure. Tricuspid  valve regurgitation is mild.   Aortic Valve: The aortic valve is tricuspid. Aortic valve regurgitation is  trivial. No aortic stenosis is present.   Pulmonic Valve: The pulmonic valve was normal in structure. Pulmonic valve  regurgitation is  mild. No evidence of pulmonic stenosis.   Aorta: The aortic root and ascending aorta are structurally normal, with  no evidence of dilitation.   Pulmonary Artery: The pulmonary artery is mildly dilated.   Venous: The inferior vena cava is normal in size with greater than 50%  respiratory variability, suggesting right atrial pressure of 3 mmHg.   IAS/Shunts: Cannot exclude a small PFO.   Coronary CTA 11/28/2022  FINDINGS: A 100 kV prospective scan was triggered in the descending thoracic aorta at 111 HU's. Axial non-contrast 3 mm slices were carried out through the heart. The data set was analyzed on a dedicated work station and scored using the Agatson method. Gantry rotation speed was 250 msecs and collimation was .6 mm. No beta blockade and 0.8 mg of sl NTG was given. The 3D data set was reconstructed in 5% intervals of the 35-75% of the R-R cycle. Phases were analyzed on a dedicated work station using MPR, MIP and VRT modes. The patient received 100 cc of contrast.   Coronary Arteries:  Normal coronary origin.  Right dominance.   RCA is a large dominant artery that gives rise to PDA and PLA. Noncalcified plaque causes minimal (0-24%) stenosis in the proximal RCA   Left main is a large artery that gives rise to LAD and LCX arteries.   LAD is a large vessel that has no plaque.   LCX is a non-dominant artery that gives rise to one large OM1 branch. There is no plaque.   Other findings:   Left Ventricle: Normal size   Left Atrium: Mild enlargement   Pulmonary Veins: Normal configuration   Right Ventricle: Normal size   Right Atrium: Normal size   Cardiac valves: No calcifications  Thoracic aorta: Normal size   Pulmonary Arteries: Normal size   Systemic Veins: Normal drainage   Pericardium: Normal thickness   IMPRESSION: 1.  Coronary calcium score of 0.   2. Total plaque volume 76mm3 which is 38th percentile for age and sex-matched controls (calcified  plaque 5mm3; noncalcified plaque 62mm3). TPV is mild   3.  Normal coronary origin with right dominance.   4. Nonobstructive CAD, with noncalcified plaque causing minimal (0-24%) stenosis in the proximal RCA   CAD-RADS 1. Minimal non-obstructive CAD (0-24%). Consider non-atherosclerotic causes of chest pain. Consider preventive therapy and risk factor modification.   Electronically Signed: By: Epifanio Lesches M.D. On: 11/28/2022 17:48      Assessment & Plan   1.  Essential hypertension-BP today 128/82 Maintain blood pressure log Continue current medical therapy Increase physical activity as tolerated  OSA-has not been using. Plans to start using.  Reviewed importance of compliance and physiology of OSA. Continue CPAP use Avoid sedatives prior to bed Avoid supine sleeping  Chest discomfort-no chest pain today.  Reassuring coronary CTA.  Details above. Heart healthy low-sodium diet.  Increase physical activity as tolerated  History of CVA-neurologically intact. Continue aspirin Follows with PCP  Disposition: follow up with Dr. Izora Ribas or me in 9-12 months   Thomasene Ripple. Dylann Gallier NP-C     01/03/2023, 8:19 AM [facility] [address] Office [phone] Fax [phone]    I spent 14 minutes examining this patient, reviewing medications, and using patient centered shared decision making involving her cardiac care.   I spent greater than 20 minutes reviewing her past medical history,  medications, and prior cardiac tests.

## 2023-01-03 ENCOUNTER — Encounter: Payer: Self-pay | Admitting: General Practice

## 2023-01-03 ENCOUNTER — Ambulatory Visit: Payer: No Typology Code available for payment source | Attending: General Practice | Admitting: General Practice

## 2023-01-03 VITALS — BP 128/82 | HR 86 | Ht 63.0 in | Wt 162.0 lb

## 2023-01-03 DIAGNOSIS — I10 Essential (primary) hypertension: Secondary | ICD-10-CM

## 2023-01-03 DIAGNOSIS — Z8673 Personal history of transient ischemic attack (TIA), and cerebral infarction without residual deficits: Secondary | ICD-10-CM

## 2023-01-03 DIAGNOSIS — G4733 Obstructive sleep apnea (adult) (pediatric): Secondary | ICD-10-CM

## 2023-01-03 DIAGNOSIS — R079 Chest pain, unspecified: Secondary | ICD-10-CM

## 2023-01-03 NOTE — Patient Instructions (Addendum)
Medication Instructions:  The current medical regimen is effective;  continue present plan and medications as directed. Please refer to the Current Medication list given to you today.  *If you need a refill on your cardiac medications before your next appointment, please call your pharmacy*  Lab Work: NONE  Other Instructions TAKE AND LOG YOUR BLOOD PRESSURE 2-3 TIMES WEEKLY PLEASE READ AND FOLLOW ATTACHED  SALTY 6 INCREASE PHYSICAL ACTIVITY AS TOLERATED  Follow-Up: At [facility], you and your health needs are our priority.  As part of our continuing mission to provide you with exceptional heart care, we have created designated Provider Care Teams.  These Care Teams include your primary Cardiologist (physician) and Advanced Practice Providers (APPs -  Physician Assistants and Nurse Practitioners) who all work together to provide you with the care you need, when you need it.  Your next appointment:   9-12 month(s)  Provider:   Christell Constant, MD

## 2023-01-31 ENCOUNTER — Other Ambulatory Visit: Payer: Self-pay | Admitting: Registered Nurse

## 2023-01-31 ENCOUNTER — Other Ambulatory Visit: Payer: No Typology Code available for payment source

## 2023-01-31 NOTE — Progress Notes (Signed)
EE in clinic this morning to have fasting labs drawn for CBC and iron.  This is a 53-month F/U lab draw,  Blood drawn from right antecubital and procedure was tolerated well.  EE advised to go to MyChart tomorrow to look for a message from [place] for the results and instructions. Reece Packer, RN, BSN, MPH, COHN-S

## 2023-02-01 ENCOUNTER — Emergency Department (HOSPITAL_COMMUNITY)
Admission: EM | Admit: 2023-02-01 | Discharge: 2023-02-01 | Disposition: A | Discharge status: Home / Self Care | Payer: No Typology Code available for payment source | Attending: Emergency Medicine | Admitting: Emergency Medicine

## 2023-02-01 ENCOUNTER — Other Ambulatory Visit: Payer: Self-pay

## 2023-02-01 DIAGNOSIS — I10 Essential (primary) hypertension: Secondary | ICD-10-CM | POA: Diagnosis not present

## 2023-02-01 DIAGNOSIS — D649 Anemia, unspecified: Secondary | ICD-10-CM

## 2023-02-01 DIAGNOSIS — Z7982 Long term (current) use of aspirin: Secondary | ICD-10-CM | POA: Insufficient documentation

## 2023-02-01 DIAGNOSIS — D509 Iron deficiency anemia, unspecified: Secondary | ICD-10-CM | POA: Diagnosis not present

## 2023-02-01 DIAGNOSIS — D500 Iron deficiency anemia secondary to blood loss (chronic): Secondary | ICD-10-CM | POA: Insufficient documentation

## 2023-02-01 LAB — COMPREHENSIVE METABOLIC PANEL
ALT: 7 U/L (ref 0–44)
AST: 11 U/L — ABNORMAL LOW (ref 15–41)
Albumin: 4.5 g/dL (ref 3.5–5.0)
Alkaline Phosphatase: 37 U/L — ABNORMAL LOW (ref 38–126)
Anion gap: 8 (ref 5–15)
BUN: 8 mg/dL (ref 6–20)
CO2: 20 mmol/L — ABNORMAL LOW (ref 22–32)
Calcium: 9.1 mg/dL (ref 8.9–10.3)
Chloride: 106 mmol/L (ref 98–111)
Creatinine, Ser: 0.68 mg/dL (ref 0.44–1.00)
GFR, Estimated: >60 mL/min (ref >60)
Glucose, Bld: 93 mg/dL (ref 70–99)
Potassium: 3.5 mmol/L (ref 3.5–5.1)
Sodium: 134 mmol/L — ABNORMAL LOW (ref 135–145)
Total Bilirubin: 0.7 mg/dL (ref <1.2)
Total Protein: 7.3 g/dL (ref 6.5–8.1)

## 2023-02-01 LAB — CBC WITH DIFFERENTIAL/PLATELET
Basophils Absolute: 0.1 10*3/uL (ref 0.0–0.2)
Basos: 1 %
EOS (ABSOLUTE): 0 10*3/uL (ref 0.0–0.4)
Eos: 1 %
Hematocrit: 22 % — ABNORMAL LOW (ref 34.0–46.6)
Hemoglobin: 5.5 g/dL — CL (ref 11.1–15.9)
Immature Grans (Abs): 0 10*3/uL (ref 0.0–0.1)
Immature Granulocytes: 0 %
Lymphocytes Absolute: 1 10*3/uL (ref 0.7–3.1)
Lymphs: 22 %
MCH: 15.1 pg — ABNORMAL LOW (ref 26.6–33.0)
MCHC: 25 g/dL — ABNORMAL LOW (ref 31.5–35.7)
MCV: 60 fL — ABNORMAL LOW (ref 79–97)
Monocytes Absolute: 0.5 10*3/uL (ref 0.1–0.9)
Monocytes: 10 %
Neutrophils Absolute: 3.1 10*3/uL (ref 1.4–7.0)
Neutrophils: 66 %
Platelets: 544 10*3/uL — ABNORMAL HIGH (ref 150–450)
RBC: 3.65 x10E6/uL — ABNORMAL LOW (ref 3.77–5.28)
RDW: 28 % — ABNORMAL HIGH (ref 11.7–15.4)
WBC: 4.7 10*3/uL (ref 3.4–10.8)

## 2023-02-01 LAB — IRON AND TIBC
Iron Saturation: 2 % — CL (ref 15–55)
Iron: 8 ug/dL — CL (ref 27–159)
Total Iron Binding Capacity: 467 ug/dL — ABNORMAL HIGH (ref 250–450)
UIBC: 459 ug/dL — ABNORMAL HIGH (ref 131–425)

## 2023-02-01 LAB — CBC
HCT: 19 % — ABNORMAL LOW (ref 36.0–46.0)
Hemoglobin: 5 g/dL — CL (ref 12.0–15.0)
MCH: 15.8 pg — ABNORMAL LOW (ref 26.0–34.0)
MCHC: 26.3 g/dL — ABNORMAL LOW (ref 30.0–36.0)
MCV: 59.9 fL — ABNORMAL LOW (ref 80.0–100.0)
Platelets: 472 10*3/uL — ABNORMAL HIGH (ref 150–400)
RBC: 3.17 MIL/uL — ABNORMAL LOW (ref 3.87–5.11)
RDW: 29.6 % — ABNORMAL HIGH (ref 11.5–15.5)
WBC: 4.2 10*3/uL (ref 4.0–10.5)
nRBC: 0 % (ref 0.0–0.2)

## 2023-02-01 LAB — PREPARE RBC (CROSSMATCH)

## 2023-02-01 LAB — HCG, SERUM, QUALITATIVE: Preg, Serum: NEGATIVE

## 2023-02-01 MED ORDER — SODIUM CHLORIDE 0.9% IV SOLUTION: Freq: Once | INTRAVENOUS | Status: AC

## 2023-02-01 NOTE — ED Notes (Signed)
Blood consent signed and on chart

## 2023-02-01 NOTE — Discharge Instructions (Addendum)
A referral was made back to hematology so they can follow-up in evaluate if any further testing needs to be done to figure out why you become increasingly anemic.  Continue to take your iron.

## 2023-02-01 NOTE — ED Triage Notes (Signed)
Pt arrived reporting she was told to come for a transfusion from MD office. Hemoglobin came back at 5. Pt states she has hx of anemia and is normal to feel tired. Denies shob, dizziness. States a little weak. No other symptoms
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
## 2023-02-01 NOTE — ED Provider Triage Note (Signed)
Emergency Medicine Provider Triage Evaluation Note  Mallory Robinson , a 47 y.o. female  was evaluated in triage.  Pt complains of anemia with some fatigue.  Patient was sent by her doctor for a Hgb of 5.5.  Currently on menstrual cycle.  Starting menopause.   Review of Systems  Positive: As above Negative: As above  Physical Exam  BP 139/89 (BP Location: Left Arm)   Pulse 88   Temp 99 F (37.2 C) (Oral)   Ht 5\' 3"  (1.6 m)   Wt 73.5 kg   LMP 02/01/2023 (Approximate)   SpO2 100%   BMI 28.70 kg/m  Gen:   Awake, no distress   Resp:  Normal effort  MSK:   Moves extremities without difficulty  Other:    Medical Decision Making  Medically screening exam initiated at 12:50 PM.  Appropriate orders placed.  Mallory Robinson was informed that the remainder of the evaluation will be completed by another provider, this initial triage assessment does not replace that evaluation, and the importance of remaining in the ED until their evaluation is complete.     Lenard Simmer, PA-C 02/01/23 1250

## 2023-02-01 NOTE — ED Notes (Signed)
Blood bank has 2 units of blood ready for this patient, informed Ashley,RN.

## 2023-02-01 NOTE — ED Provider Notes (Signed)
EMERGENCY DEPARTMENT AT [facility] Provider Note   CSN: [id number] Arrival date & time: 02/01/23  1236     History  Chief Complaint  Patient presents with   Anemia    Mallory Robinson is a 47 y.o. female.  Patient is a 47 year old female with a history of hypertension, prior stroke and chronic anemia typically with hemoglobin between 7 and 8 on chronic iron supplements who is presenting today due to low hemoglobin.  Patient reports that she is always tired but denies any shortness of breath, chest pain, syncope.  She reports in the past they always thought her low hemoglobin was related to heavy menses but she reports since starting perimenopause timing at her menses have been a lot shorter typically only bleeding for 2 days.  She does report backing off on her iron and not taking in as much because it causes her to be nauseated but otherwise denies any other source of bleeding.  No black stools no blood thinners.  She has had no abdominal surgeries that would decrease absorption.  She has required blood transfusions in the past and did see hematology several years ago but is not seeing them actively.  The history is provided by the patient.  Anemia       Home Medications Prior to Admission medications   Medication Sig Start Date End Date Taking? Authorizing Provider  aspirin EC 81 MG tablet Take 81 mg by mouth daily.   Yes [provider]  carvedilol (COREG) 6.25 MG tablet Take 1 tablet (6.25 mg total) by mouth 2 (two) times daily with a meal. 10/24/22  Yes Georganna Skeans, MD  ferrous sulfate 325 (65 FE) MG tablet Take 1 tablet (325 mg total) by mouth 2 (two) times daily with a meal. Patient taking differently: Take 325 mg by mouth every other day. 05/01/22  Yes Georganna Skeans, MD  Cholecalciferol (VITAMIN D3) 50 MCG (2000 UT) capsule Take 1 capsule (2,000 Units total) by mouth daily. Take with meal Patient not taking: Reported on 02/01/2023 10/19/22    Barbaraann Barthel, NP      Allergies    Food and Penicillins    Review of Systems   Review of Systems  Physical Exam Updated Vital Signs BP 130/70   Pulse 77   Temp 98 F (36.7 C)   Resp 20   Ht 5\' 3"  (1.6 m)   Wt 73.5 kg   LMP 02/01/2023 (Approximate)   SpO2 100%   BMI 28.70 kg/m  Physical Exam Vitals and nursing note reviewed.  Constitutional:      General: She is not in acute distress.    Appearance: She is well-developed.  HENT:     Head: Normocephalic and atraumatic.  Eyes:     Conjunctiva/sclera: Conjunctivae normal.     Pupils: Pupils are equal, round, and reactive to light.  Cardiovascular:     Rate and Rhythm: Normal rate and regular rhythm.     Heart sounds: Murmur heard.  Pulmonary:     Effort: Pulmonary effort is normal. No respiratory distress.     Breath sounds: Normal breath sounds. No wheezing or rales.  Abdominal:     General: There is no distension.     Palpations: Abdomen is soft.     Tenderness: There is no abdominal tenderness. There is no guarding or rebound.  Musculoskeletal:        General: No tenderness. Normal range of motion.     Cervical back: Normal  range of motion and neck supple.  Skin:    General: Skin is warm and dry.     Coloration: Skin is pale.     Findings: No erythema or rash.  Neurological:     Mental Status: She is alert and oriented to person, place, and time.  Psychiatric:        Behavior: Behavior normal.     ED Results / Procedures / Treatments   Labs (all labs ordered are listed, but only abnormal results are displayed) Labs Reviewed  COMPREHENSIVE METABOLIC PANEL - Abnormal; Notable for the following components:      Result Value   Sodium 134 (*)    CO2 20 (*)    AST 11 (*)    Alkaline Phosphatase 37 (*)    All other components within normal limits  CBC - Abnormal; Notable for the following components:   RBC 3.17 (*)    Hemoglobin 5.0 (*)    HCT 19.0 (*)    MCV 59.9 (*)    MCH 15.8 (*)    MCHC 26.3  (*)    RDW 29.6 (*)    Platelets 472 (*)    All other components within normal limits  HCG, SERUM, QUALITATIVE  TYPE AND SCREEN  PREPARE RBC (CROSSMATCH)    EKG None  Radiology No results found.  Procedures Procedures    Medications Ordered in ED Medications  0.9 %  sodium chloride infusion (Manually program via Guardrails IV Fluids) ( Intravenous New Bag/Given 02/01/23 1455)    ED Course/ Medical Decision Making/ A&P                                 Medical Decision Making Amount and/or Complexity of Data Reviewed External Data Reviewed: notes. Labs: ordered. Decision-making details documented in ED Course.  Risk Prescription drug management.   Pt with multiple medical problems and comorbidities and presenting today with a complaint that caries a high risk for morbidity and mortality.  Here today with abnormal lab.  Patient found to be anemic.  She is tired but denies any other symptoms of anemia.  Typical hemoglobin is between 7 and 8 based on recent labs in the last year and a half.  Patient did stop taking her iron as regularly but otherwise does not take anticoagulation or any other increased risk of bleeding.  However she is also not having heavy menses anymore so it is unclear what is causing her persistent anemia.  Based on studies done by her doctor her iron and iron saturation is low but binding is elevated.  Feel that patient would benefit from 2 units of blood but then will need hematology follow-up.  She will start back on her regular iron regiment and may need an infusion.  No indication that patient requires admission today as she is otherwise stable with stable vital signs.  I indolent pendantly interpreted patient's labs today and patient CBC with a hemoglobin of 5 which appears to be microcytic and a CMP without acute findings.  CRITICAL CARE Performed by: Ruth Kovich Total critical care time: 30 minutes Critical care time was exclusive of separately  billable procedures and treating other patients. Critical care was necessary to treat or prevent imminent or life-threatening deterioration. Critical care was time spent personally by me on the following activities: development of treatment plan with patient and/or surrogate as well as nursing, discussions with consultants, evaluation of patient's response to  treatment, examination of patient, obtaining history from patient or surrogate, ordering and performing treatments and interventions, ordering and review of laboratory studies, ordering and review of radiographic studies, pulse oximetry and re-evaluation of patient's condition.         Final Clinical Impression(s) / ED Diagnoses Final diagnoses:  Symptomatic anemia  Iron deficiency anemia due to chronic blood loss    Rx / DC Orders ED Discharge Orders          Ordered    Ambulatory referral to Hematology / Oncology        02/01/23 1728              Gwyneth Sprout, MD 02/01/23 1728

## 2023-02-04 ENCOUNTER — Telehealth: Payer: Self-pay | Admitting: Hematology and Oncology

## 2023-02-04 LAB — TYPE AND SCREEN
ABO/RH(D): B POS
Antibody Screen: NEGATIVE
Unit division: 0
Unit division: 0

## 2023-02-04 LAB — BPAM RBC
Blood Product Expiration Date: 202412302359
Blood Product Expiration Date: 202412312359
ISSUE DATE / TIME: 202412131451
ISSUE DATE / TIME: 202412131655
Unit Type and Rh: 7300
Unit Type and Rh: 7300

## 2023-02-06 ENCOUNTER — Other Ambulatory Visit: Payer: Self-pay

## 2023-02-07 ENCOUNTER — Encounter: Payer: Self-pay | Admitting: Nurse Practitioner

## 2023-02-07 ENCOUNTER — Other Ambulatory Visit: Payer: Self-pay | Admitting: Nurse Practitioner

## 2023-02-07 ENCOUNTER — Inpatient Hospital Stay: Payer: No Typology Code available for payment source | Attending: Nurse Practitioner | Admitting: Nurse Practitioner

## 2023-02-07 ENCOUNTER — Telehealth: Payer: Self-pay | Admitting: Pharmacy Technician

## 2023-02-07 ENCOUNTER — Inpatient Hospital Stay: Payer: No Typology Code available for payment source

## 2023-02-07 VITALS — BP 148/93 | HR 61 | Temp 97.3°F | Resp 16 | Wt 156.1 lb

## 2023-02-07 DIAGNOSIS — D75839 Thrombocytosis, unspecified: Secondary | ICD-10-CM

## 2023-02-07 DIAGNOSIS — D509 Iron deficiency anemia, unspecified: Secondary | ICD-10-CM

## 2023-02-07 DIAGNOSIS — Z8673 Personal history of transient ischemic attack (TIA), and cerebral infarction without residual deficits: Secondary | ICD-10-CM | POA: Diagnosis not present

## 2023-02-07 DIAGNOSIS — Z7982 Long term (current) use of aspirin: Secondary | ICD-10-CM | POA: Diagnosis not present

## 2023-02-07 LAB — CBC WITH DIFFERENTIAL (CANCER CENTER ONLY)
Abs Immature Granulocytes: 0 10*3/uL (ref 0.00–0.07)
Basophils Absolute: 0.1 10*3/uL (ref 0.0–0.1)
Basophils Relative: 1 %
Eosinophils Absolute: 0 10*3/uL (ref 0.0–0.5)
Eosinophils Relative: 1 %
HCT: 24.8 % — ABNORMAL LOW (ref 36.0–46.0)
Hemoglobin: 7.1 g/dL — ABNORMAL LOW (ref 12.0–15.0)
Immature Granulocytes: 0 %
Lymphocytes Relative: 26 %
Lymphs Abs: 1 10*3/uL (ref 0.7–4.0)
MCH: 19.3 pg — ABNORMAL LOW (ref 26.0–34.0)
MCHC: 28.6 g/dL — ABNORMAL LOW (ref 30.0–36.0)
MCV: 67.6 fL — ABNORMAL LOW (ref 80.0–100.0)
Monocytes Absolute: 0.4 10*3/uL (ref 0.1–1.0)
Monocytes Relative: 10 %
Neutro Abs: 2.5 10*3/uL (ref 1.7–7.7)
Neutrophils Relative %: 62 %
Platelet Count: 132 10*3/uL — ABNORMAL LOW (ref 150–400)
RBC: 3.67 MIL/uL — ABNORMAL LOW (ref 3.87–5.11)
WBC Count: 3.9 10*3/uL — ABNORMAL LOW (ref 4.0–10.5)
nRBC: 0 % (ref 0.0–0.2)

## 2023-02-07 LAB — IRON AND IRON BINDING CAPACITY (CC-WL,HP ONLY)
Iron: 394 ug/dL — ABNORMAL HIGH (ref 28–170)
Saturation Ratios: 79 % — ABNORMAL HIGH (ref 10.4–31.8)
TIBC: 497 ug/dL — ABNORMAL HIGH (ref 250–450)
UIBC: 103 ug/dL — ABNORMAL LOW (ref 148–442)

## 2023-02-07 LAB — FERRITIN: Ferritin: 4 ng/mL — ABNORMAL LOW (ref 11–307)

## 2023-02-07 NOTE — Telephone Encounter (Signed)
Mosetta Pigeon note:  patient will be scheduled as soon as possible.  Auth Submission: NO AUTH NEEDED Site of care: Site of care: [facility] Payer: MEDCOST / HEALTHY BLUE Medication & CPT/J Code(s) submitted: Feraheme (ferumoxytol) [id number] Route of submission (phone, fax, portal):  Phone # Fax # Auth type: Buy/Bill PB Units/visits requested: 2 Reference number: [id number] Approval from: 02/19/23 to 03/22/23
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
## 2023-02-08 ENCOUNTER — Telehealth: Payer: Self-pay | Admitting: Hematology and Oncology

## 2023-02-11 ENCOUNTER — Other Ambulatory Visit: Payer: Self-pay | Admitting: Registered Nurse

## 2023-02-11 DIAGNOSIS — D500 Iron deficiency anemia secondary to blood loss (chronic): Secondary | ICD-10-CM

## 2023-02-19 ENCOUNTER — Ambulatory Visit: Payer: No Typology Code available for payment source

## 2023-02-19 VITALS — BP 144/94 | HR 76 | Temp 98.6°F | Resp 16 | Ht 63.0 in | Wt 156.0 lb

## 2023-02-19 DIAGNOSIS — D509 Iron deficiency anemia, unspecified: Secondary | ICD-10-CM

## 2023-02-19 MED ORDER — DIPHENHYDRAMINE HCL 25 MG PO CAPS
25.0000 mg | ORAL_CAPSULE | Freq: Once | ORAL | Status: AC
  Administered 2023-02-19: 25 mg via ORAL
  Filled 2023-02-19: qty 1

## 2023-02-19 MED ORDER — SODIUM CHLORIDE 0.9 % IV SOLN
510.0000 mg | Freq: Once | INTRAVENOUS | Status: AC
  Administered 2023-02-19: 510 mg via INTRAVENOUS
  Filled 2023-02-19: qty 17

## 2023-02-19 MED ORDER — ACETAMINOPHEN 325 MG PO TABS
650.0000 mg | ORAL_TABLET | Freq: Once | ORAL | Status: AC
  Administered 2023-02-19: 650 mg via ORAL
  Filled 2023-02-19: qty 2

## 2023-02-19 NOTE — Progress Notes (Signed)
 Diagnosis: Acute Anemia  Provider:  Mannam, Praveen MD  Procedure: IV Infusion  IV Type: Peripheral, IV Location: R Antecubital  Feraheme  (Ferumoxytol ), Dose: 510 mg  Infusion Start Time: 1554  Infusion Stop Time: 1611  Post Infusion IV Care: Patient declined observation and Peripheral IV Discontinued  Discharge: Condition: Good, Destination: Home . AVS Declined  Performed by:  Rocky FORBES Sar, RN

## 2023-02-22 ENCOUNTER — Inpatient Hospital Stay: Payer: No Typology Code available for payment source | Attending: Hematology and Oncology

## 2023-02-22 ENCOUNTER — Telehealth: Payer: Self-pay

## 2023-02-22 DIAGNOSIS — Z79899 Other long term (current) drug therapy: Secondary | ICD-10-CM | POA: Insufficient documentation

## 2023-02-22 DIAGNOSIS — D509 Iron deficiency anemia, unspecified: Secondary | ICD-10-CM | POA: Insufficient documentation

## 2023-02-22 LAB — CBC WITH DIFFERENTIAL (CANCER CENTER ONLY)
Abs Immature Granulocytes: 0.02 K/uL (ref 0.00–0.07)
Basophils Absolute: 0.1 K/uL (ref 0.0–0.1)
Basophils Relative: 1 %
Eosinophils Absolute: 0.1 K/uL (ref 0.0–0.5)
Eosinophils Relative: 1 %
HCT: 32.5 % — ABNORMAL LOW (ref 36.0–46.0)
Hemoglobin: 9.4 g/dL — ABNORMAL LOW (ref 12.0–15.0)
Immature Granulocytes: 0 %
Lymphocytes Relative: 16 %
Lymphs Abs: 1.2 K/uL (ref 0.7–4.0)
MCH: 21.2 pg — ABNORMAL LOW (ref 26.0–34.0)
MCHC: 28.9 g/dL — ABNORMAL LOW (ref 30.0–36.0)
MCV: 73.2 fL — ABNORMAL LOW (ref 80.0–100.0)
Monocytes Absolute: 0.6 K/uL (ref 0.1–1.0)
Monocytes Relative: 8 %
Neutro Abs: 5.5 K/uL (ref 1.7–7.7)
Neutrophils Relative %: 74 %
Platelet Count: 1159 K/uL (ref 150–400)
RBC: 4.44 MIL/uL (ref 3.87–5.11)
WBC Count: 7.4 K/uL (ref 4.0–10.5)
nRBC: 0 % (ref 0.0–0.2)

## 2023-02-22 LAB — RETIC PANEL

## 2023-02-22 LAB — IRON AND IRON BINDING CAPACITY (CC-WL,HP ONLY)
Iron: 581 ug/dL — ABNORMAL HIGH (ref 28–170)
Saturation Ratios: 134 % — ABNORMAL HIGH (ref 10.4–31.8)
TIBC: 433 ug/dL (ref 250–450)
UIBC: UNDETERMINED ug/dL (ref 148–442)

## 2023-02-22 LAB — FERRITIN: Ferritin: 191 ng/mL (ref 11–307)

## 2023-02-22 LAB — FOLATE: Folate: 7.6 ng/mL (ref >5.9)

## 2023-02-22 LAB — VITAMIN B12: Vitamin B-12: 260 pg/mL (ref 180–914)

## 2023-02-22 NOTE — Telephone Encounter (Signed)
 CRITICAL VALUE STICKER  CRITICAL VALUE: Platelets [id number]  RECEIVER (on-site recipient of call): Norleen Spain CMA  DATE & TIME NOTIFIED: 02/22/2023 0800  MESSENGER (representative from lab): Almarie in Lab  MD NOTIFIED: Lacie Burton NP  TIME OF NOTIFICATION: 0800  RESPONSE: Made doctor aware

## 2023-02-24 LAB — METHYLMALONIC ACID, SERUM: Methylmalonic Acid, Quantitative: 148 nmol/L (ref 0–378)

## 2023-02-26 ENCOUNTER — Ambulatory Visit: Payer: No Typology Code available for payment source

## 2023-02-26 VITALS — BP 153/97 | HR 64 | Temp 98.6°F | Resp 16 | Ht 63.0 in | Wt 156.2 lb

## 2023-02-26 DIAGNOSIS — D509 Iron deficiency anemia, unspecified: Secondary | ICD-10-CM | POA: Diagnosis not present

## 2023-02-26 LAB — CELIAC PANEL 10
Antigliadin Abs, IgA: 4 U (ref 0–19)
Endomysial Ab, IgA: NEGATIVE
Gliadin IgG: 2 U (ref 0–19)
IgA: 135 mg/dL (ref 87–352)
Tissue Transglut Ab: <2 U/mL (ref 0–5)
Tissue Transglutaminase Ab, IgA: <2 U/mL (ref 0–3)

## 2023-02-26 MED ORDER — SODIUM CHLORIDE 0.9 % IV SOLN
510.0000 mg | Freq: Once | INTRAVENOUS | Status: AC
  Administered 2023-02-26: 510 mg via INTRAVENOUS
  Filled 2023-02-26: qty 17

## 2023-02-26 MED ORDER — ACETAMINOPHEN 325 MG PO TABS
650.0000 mg | ORAL_TABLET | Freq: Once | ORAL | Status: AC
  Administered 2023-02-26: 650 mg via ORAL
  Filled 2023-02-26: qty 2

## 2023-02-26 MED ORDER — DIPHENHYDRAMINE HCL 25 MG PO CAPS
25.0000 mg | ORAL_CAPSULE | Freq: Once | ORAL | Status: AC
  Administered 2023-02-26: 25 mg via ORAL
  Filled 2023-02-26: qty 1

## 2023-02-26 NOTE — Progress Notes (Signed)
 Diagnosis: Iron  Deficiency Anemia  Provider:  Praveen Mannam MD  Procedure: IV Infusion  IV Type: Peripheral, IV Location: R Antecubital  Feraheme  (Ferumoxytol ), Dose: 510 mg  Infusion Start Time: 1608  Infusion Stop Time: 1625  Post Infusion IV Care: Patient declined observation and Peripheral IV Discontinued  Discharge: Condition: Good, Destination: Home . AVS Declined  Performed by:  Leita FORBES Miles, LPN

## 2023-03-05 ENCOUNTER — Encounter: Payer: Self-pay | Admitting: Registered Nurse

## 2023-03-05 ENCOUNTER — Ambulatory Visit: Payer: Self-pay | Admitting: Registered Nurse

## 2023-03-05 VITALS — BP 144/94 | HR 63 | Temp 97.2°F | Resp 16

## 2023-03-05 DIAGNOSIS — A084 Viral intestinal infection, unspecified: Secondary | ICD-10-CM

## 2023-03-05 NOTE — Patient Instructions (Signed)
 Viral Gastroenteritis, Adult  Viral gastroenteritis is also known as the stomach flu. This condition may affect your stomach, small intestine, and large intestine. It can cause sudden watery diarrhea, fever, and vomiting. This condition is caused by many different viruses. These viruses can be passed from person to person very easily (are contagious). Diarrhea and vomiting can make you feel weak and cause you to become dehydrated. You may not be able to keep fluids down. Dehydration can make you tired and thirsty, cause you to have a dry mouth, and decrease how often you urinate. It is important to replace the fluids that you lose from diarrhea and vomiting. What are the causes? Gastroenteritis is caused by many viruses, including rotavirus and norovirus. Norovirus is the most common cause in adults. You can get sick after being exposed to the viruses from other people. You can also get sick by: Eating food, drinking water, or touching a surface contaminated with one of these viruses. Sharing utensils or other personal items with an infected person. What increases the risk? You are more likely to develop this condition if you: Have a weak body defense system (immune system). Live with one or more children who are younger than 2 years. Live in a nursing home. Travel on cruise ships. What are the signs or symptoms? Symptoms of this condition start suddenly 1-3 days after exposure to a virus. Symptoms may last for a few days or for as long as a week. Common symptoms include watery diarrhea and vomiting. Other symptoms include: Fever. Headache. Fatigue. Pain in the abdomen. Chills. Weakness. Nausea. Muscle aches. Loss of appetite. How is this diagnosed? This condition is diagnosed with a medical history and physical exam. You may also have a stool test to check for viruses or other infections. How is this treated? This condition typically goes away on its own. The focus of treatment is to  prevent dehydration and restore lost fluids (rehydration). This condition may be treated with: An oral rehydration solution (ORS) to replace important salts and minerals (electrolytes) in your body. Take this if told by your health care provider. This is a drink that is sold at pharmacies and retail stores. Medicines to help with your symptoms. Probiotic supplements to reduce symptoms of diarrhea. Fluids given through an IV, if dehydration is severe. Older adults and people with other diseases or a weak immune system are at higher risk for dehydration. Follow these instructions at home: Eating and drinking  Take an ORS as told by your health care provider. Drink clear fluids in small amounts as you are able. Clear fluids include: Water. Ice chips. Diluted fruit juice. Low-calorie sports drinks. Drink enough fluid to keep your urine pale yellow. Eat small amounts of healthy foods every 3-4 hours as you are able. This may include whole grains, fruits, vegetables, lean meats, and yogurt. Avoid fluids that contain a lot of sugar or caffeine, such as energy drinks, sports drinks, and soda. Avoid spicy or fatty foods. Avoid alcohol . General instructions  Wash your hands often, especially after having diarrhea or vomiting. If soap and water are not available, use hand sanitizer. Make sure that all people in your household wash their hands well and often. Take over-the-counter and prescription medicines only as told by your health care provider. Rest at home while you recover. Watch your condition for any changes. Take a warm bath to relieve any burning or pain from frequent diarrhea episodes. Keep all follow-up visits. This is important. Contact a health care  provider if you: Cannot keep fluids down. Have symptoms that get worse. Have new symptoms. Feel light-headed or dizzy. Have muscle cramps. Get help right away if you: Have chest pain. Have trouble breathing or you are breathing  very quickly. Have a fast heartbeat. Feel extremely weak or you faint. Have a severe headache, a stiff neck, or both. Have a rash. Have severe pain, cramping, or bloating in your abdomen. Have skin that feels cold and clammy. Feel confused. Have pain when you urinate. Have signs of dehydration, such as: Dark urine, very little urine, or no urine. Cracked lips. Dry mouth. Sunken eyes. Sleepiness. Weakness. Have signs of bleeding, such as: Seeing blood in your vomit. Having vomit that looks like coffee grounds. Having bloody or black stools or stools that look like tar. These symptoms may be an emergency. Get help right away. Call 911. Do not wait to see if the symptoms will go away. Do not drive yourself to the hospital. Summary Viral gastroenteritis is also known as the stomach flu. It can cause sudden watery diarrhea, fever, and vomiting. This condition can be passed from person to person very easily (is contagious). Take an oral rehydration solution (ORS) if told by your health care provider. This is a drink that is sold at pharmacies and retail stores. Wash your hands often, especially after having diarrhea or vomiting. If soap and water are not available, use hand sanitizer. This information is not intended to replace advice given to you by your health care provider. Make sure you discuss any questions you have with your health care provider. Document Revised: 12/05/2020 Document Reviewed: 12/05/2020 Elsevier Patient Education  2024 [employer] Food Choices to Help Relieve Diarrhea, Adult Diarrhea can make you feel weak and cause you to become dehydrated. Dehydration is a condition in which there is not enough water or other fluids in the body. It is important to choose the right foods and drinks to: Relieve diarrhea. Replace lost fluids and nutrients. Prevent dehydration. What are tips for following this plan? Relieving diarrhea Avoid foods that make your diarrhea worse.  These may include: Foods and drinks that are sweetened with high-fructose corn syrup, honey, or sweeteners such as xylitol, sorbitol, and mannitol. Check food labels for these ingredients. Fried, greasy, or spicy foods. Raw fruits and vegetables. Eat foods that are rich in probiotics. These include foods such as yogurt and fermented milk products. Probiotics can help increase healthy bacteria in your stomach and intestines (gastrointestinal or GI tract). This may help digestion and stop diarrhea. If you have lactose intolerance, avoid dairy products. These may make your diarrhea worse. Take medicine to help stop diarrhea only as told by your health care provider. Replacing nutrients  Eat bland, easy-to-digest foods in small amounts as you are able, until your diarrhea starts to get better. These foods include bananas, applesauce, rice, toast, and crackers. Over time, add nutrient-rich foods as your body tolerates them or as told by your health care provider. These include: Well-cooked protein foods, such as eggs, lean meats like fish or chicken without skin, and tofu. Peeled, seeded, and soft-cooked fruits and vegetables. Low-fat dairy products. Whole grains. Take vitamin and mineral supplements as told by your health care provider. Preventing dehydration  Start by sipping water or a solution to prevent dehydration (oral rehydration solution, or ORS). This is a drink that helps replace fluids and minerals your body has lost. You can buy an ORS at pharmacies and retail stores. Try to drink at least  8-10 cups (2,000-2,500 mL) of fluid each day to help replace lost fluids. If your urine is pale yellow, you are getting enough fluids. You may drink other liquids in addition to water, such as fruit juice that you have added water to (diluted fruit juice) or low-calorie sports drinks, as tolerated or as told by your health care provider. Avoid drinks with caffeine, such as coffee, tea, or soft  drinks. Avoid alcohol . This information is not intended to replace advice given to you by your health care provider. Make sure you discuss any questions you have with your health care provider. Document Revised: 07/25/2021 Document Reviewed: 07/25/2021 Elsevier Patient Education  2024 [employer] Diarrhea, Adult Diarrhea is frequent loose and sometimes watery bowel movements. Diarrhea can make you feel weak and cause you to become dehydrated. Dehydration is a condition in which there is not enough water or other fluids in the body. Dehydration can make you tired and thirsty, cause you to have a dry mouth, and decrease how often you urinate. Diarrhea typically lasts 2-3 days. However, it can last longer if it is a sign of something more serious. It is important to treat your diarrhea as told by your health care provider. Follow these instructions at home: Eating and drinking     Follow these recommendations as told by your health care provider: Take an oral rehydration solution (ORS). This is an over-the-counter medicine that helps return your body to its normal balance of nutrients and water. It is found at pharmacies and retail stores. Drink enough fluid to keep your urine pale yellow. Drink fluids such as water, diluted fruit juice, and low-calorie sports drinks. You can drink milk also, if desired. Sucking on ice chips is another way to get fluids. Avoid drinking fluids that contain a lot of sugar or caffeine, such as soda, energy drinks, and regular sports drinks. Avoid alcohol . Eat bland, easy-to-digest foods in small amounts as you are able. These foods include bananas, applesauce, rice, lean meats, toast, and crackers. Avoid spicy or fatty foods.  Medicines Take over-the-counter and prescription medicines only as told by your health care provider. If you were prescribed antibiotics, take them as told by your health care provider. Do not stop using the antibiotic even if you start to  feel better. General instructions  Wash your hands often using soap and water for at least 20 seconds. If soap and water are not available, use hand sanitizer. Others in the household should wash their hands as well. Hands should be washed: After using the toilet or changing a diaper. Before preparing, cooking, or serving food. While caring for a sick person or while visiting someone in a hospital. Rest at home while you recover. Take a warm bath to relieve any burning or pain from frequent diarrhea episodes. Watch your condition for any changes. Contact a health care provider if: You have a fever. Your diarrhea gets worse. You have new symptoms. You vomit every time you eat or drink. You feel light-headed, dizzy, or have a headache. You have muscle cramps. You have signs of dehydration, such as: Dark urine, very little urine, or no urine. Cracked lips. Dry mouth. Sunken eyes. Sleepiness. Weakness. You have bloody or black stools or stools that look like tar. You have severe pain, cramping, or bloating in your abdomen. Your skin feels cold and clammy. You feel confused. Get help right away if: You have chest pain or your heart is beating very quickly. You have trouble breathing or you  are breathing very quickly. You feel extremely weak or you faint. These symptoms may be an emergency. Get help right away. Call 911. Do not wait to see if the symptoms will go away. Do not drive yourself to the hospital. This information is not intended to replace advice given to you by your health care provider. Make sure you discuss any questions you have with your health care provider. Document Revised: 07/25/2021 Document Reviewed: 07/25/2021 Elsevier Patient Education  2024 [employer].

## 2023-03-05 NOTE — Progress Notes (Signed)
 Subjective:    Patient ID: Mallory Robinson, female    DOB: 08-Apr-1975, 48 y.o.   MRN: [id number]  47y/o african american female established patient stated she woke up today and had loose stool, felt bloated.  After trying to eat some breakfast noted stabbing pains in abdomen and stools then much looser so switched to ginger ale and tea instead.  Denied explosive diarrhea, fever, blood in urine/stool.  Has had some chills and feeling cold at work today.  Has not home covid tested.  Other individuals at work are sick/coughing/running nose denied contact with others with diarrhea/known flu/covid.  Decreased appetite, bloating feeling has lessened as day progressed.  Abdomen sore if she presses it.  Took peptobismol prior to coming to work and she thinks it has helped some also      Review of Systems  Constitutional:  Positive for appetite change and chills. Negative for diaphoresis, fatigue and fever.  HENT:  Negative for congestion, ear discharge, ear pain, facial swelling, hearing loss, mouth sores, nosebleeds, postnasal drip, rhinorrhea, sinus pressure, sinus pain, sneezing, sore throat, tinnitus, trouble swallowing and voice change.   Eyes:  Negative for photophobia and visual disturbance.  Respiratory:  Negative for cough, shortness of breath, wheezing and stridor.   Cardiovascular:  Negative for chest pain, palpitations and leg swelling.  Gastrointestinal:  Positive for abdominal distention, abdominal pain, diarrhea and nausea. Negative for anal bleeding, blood in stool, constipation, rectal pain and vomiting.  Genitourinary:  Negative for difficulty urinating.  Musculoskeletal:  Negative for gait problem, myalgias, neck pain and neck stiffness.  Neurological:  Negative for dizziness, tremors, seizures, syncope, facial asymmetry, speech difficulty, weakness, light-headedness and headaches.  Hematological:  Negative for adenopathy. Does not bruise/bleed easily.  Psychiatric/Behavioral:   Negative for agitation, confusion and sleep disturbance.        Objective:   Physical Exam Vitals reviewed.  Constitutional:      General: She is awake. She is not in acute distress.    Appearance: Normal appearance. She is well-developed, well-groomed and overweight. She is not ill-appearing, toxic-appearing or diaphoretic.  HENT:     Head: Normocephalic and atraumatic.     Jaw: There is normal jaw occlusion.     Salivary Glands: Right salivary gland is not diffusely enlarged or tender. Left salivary gland is not diffusely enlarged or tender.     Right Ear: Hearing, ear canal and external ear normal. No decreased hearing noted. No laceration, drainage, swelling or tenderness. A middle ear effusion is present.     Left Ear: Hearing, ear canal and external ear normal. No decreased hearing noted. No laceration, drainage, swelling or tenderness. A middle ear effusion is present.     Nose: Nose normal. No congestion or rhinorrhea.     Right Nostril: No epistaxis.     Left Nostril: No epistaxis.     Right Turbinates: Not enlarged, swollen or pale.     Left Turbinates: Not enlarged, swollen or pale.     Right Sinus: No maxillary sinus tenderness or frontal sinus tenderness.     Left Sinus: No maxillary sinus tenderness or frontal sinus tenderness.     Mouth/Throat:     Lips: Pink. No lesions.     Mouth: Mucous membranes are moist. No oral lesions or angioedema.     Dentition: No gum lesions.     Tongue: No lesions. Tongue does not deviate from midline.     Palate: No mass and lesions.  Pharynx: Uvula midline. Pharyngeal swelling present. No oropharyngeal exudate, posterior oropharyngeal erythema or uvula swelling.     Tonsils: No tonsillar exudate.     Comments: Cobblestoning posterior pharynx; bilateral allergic shiners; bilateral TMs intact air fluid level clear; no debris noted in auditory canals Eyes:     General: Lids are normal. Vision grossly intact. Gaze aligned appropriately.  Allergic shiner present. No scleral icterus.       Right eye: No discharge.        Left eye: No discharge.     Extraocular Movements: Extraocular movements intact.     Conjunctiva/sclera: Conjunctivae normal.     Pupils: Pupils are equal, round, and reactive to light.  Neck:     Trachea: Trachea and phonation normal.  Cardiovascular:     Rate and Rhythm: Normal rate and regular rhythm.     Pulses:          Radial pulses are 2+ on the right side and 2+ on the left side.     Heart sounds: Normal heart sounds, S1 normal and S2 normal.  Pulmonary:     Effort: Pulmonary effort is normal.     Breath sounds: Normal breath sounds and air entry. No stridor, decreased air movement or transmitted upper airway sounds. No decreased breath sounds, wheezing or rhonchi.     Comments: Spoke full sentences without difficulty; no cough observed in exam room Abdominal:     General: Abdomen is flat. Bowel sounds are normal. There is no distension or abdominal bruit. There are no signs of injury.     Palpations: Abdomen is soft. There is no shifting dullness, fluid wave, hepatomegaly, splenomegaly, mass or pulsatile mass.     Tenderness: There is generalized abdominal tenderness. There is no right CVA tenderness, left CVA tenderness, guarding or rebound. Negative signs include Murphy's sign.     Comments: Normoactive bowel sounds x 4 quads; dull to percussion x 4 quads; standing to sitting to supine and reversed quickly without assistance on exam table back to sitting and standing  Musculoskeletal:        General: Normal range of motion.     Right hand: Normal strength. Normal capillary refill.     Left hand: Normal strength. Normal capillary refill.     Cervical back: Normal range of motion and neck supple. No swelling, edema, deformity, erythema, signs of trauma, lacerations, rigidity, spasms, torticollis, tenderness or crepitus. No pain with movement. Normal range of motion.     Thoracic back: No swelling,  edema, deformity, signs of trauma, lacerations, spasms or tenderness. Normal range of motion.     Right lower leg: No edema.     Left lower leg: No edema.  Lymphadenopathy:     Head:     Right side of head: No submental, submandibular, tonsillar, preauricular, posterior auricular or occipital adenopathy.     Left side of head: No submental, submandibular, tonsillar, preauricular, posterior auricular or occipital adenopathy.     Cervical: No cervical adenopathy.     Right cervical: No superficial, deep or posterior cervical adenopathy.    Left cervical: No superficial, deep or posterior cervical adenopathy.  Skin:    General: Skin is warm and dry.     Capillary Refill: Capillary refill takes less than 2 seconds.     Coloration: Skin is not ashen, cyanotic, jaundiced, mottled, pale or sallow.     Findings: No abrasion, abscess, acne, bruising, burn, ecchymosis, erythema, signs of injury, laceration, lesion, petechiae, rash or wound.  Nails: There is no clubbing.  Neurological:     General: No focal deficit present.     Mental Status: She is alert and oriented to person, place, and time. Mental status is at baseline.     GCS: GCS eye subscore is 4. GCS verbal subscore is 5. GCS motor subscore is 6.     Cranial Nerves: No cranial nerve deficit, dysarthria or facial asymmetry.     Motor: Motor function is intact. No weakness, tremor, atrophy, abnormal muscle tone or seizure activity.     Coordination: Coordination is intact. Coordination normal.     Gait: Gait is intact. Gait normal.     Comments: In/out of chair and on/off exam table without difficulty; gait sure and steady in clinic; bilateral hand grasp equal 5/5  Psychiatric:        Attention and Perception: Attention and perception normal.        Mood and Affect: Mood and affect normal.        Speech: Speech normal.        Behavior: Behavior normal. Behavior is cooperative.        Thought Content: Thought content normal.         Cognition and Memory: Cognition and memory normal.        Judgment: Judgment normal.     Home covid test negative      Assessment & Plan:   A-viral gastroenteritis  P-Discussed with patient influenza/covid/norovirus circulating in high numbers in local community.  Without fever do not think this is flu.  Given 1 free [place]  govt home test to complete prior to returning to work.  Has ondansetron  ODT at home for prn use n/v.  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  I have recommended clear fluids and advanced to soft as tolerated.  Avoid dairy, spicy and fried foods until diarrhea resolves. Avoid dehydration drink noncaffeinated beverages (water, ginger ale, soup broth, popsicles, no sugar added gatorade/powerade) to urinate every 2-4 hours pale yellow urine.  It is easy to become dehydrated when having diarrhea along with electrolyte imbalances.  Patient to take  temperature and if less than 100.5 F/no fever and having frequent watery stools may take over the counter Imodium  per manufacturer's instructions but cautioned patient it can increase BP/HR use sparingly.  Discussed with patient seek re-evaluation same day if having more than 10 watery stools per day/blood red or black in stool. Discussed antibiotics do not help resolve viral GI infection.   Patient given [facility] handouts on viral gastroenteritis, diarrhea and foods to relieve diarrhea. Medications as directed.  Return to the clinic if symptoms persist or worsen; I have alerted the patient to call if high fever, dehydration, marked weakness, fainting, increased abdominal pain, blood in stool or vomit. Patient given work excuse note for 48 hours per employer communicable disease policy.  Supervisor and HR notified patient sent home from clinic today and excused for today and tomorrow.  Discussed with patient RN Apolinar will call  her to follow up tomorrow regarding if RTW 03/07/23 discussed diarrhea must be resolved no fever/vomiting in previous 24 hours for RTW also.   Patient verbalized agreement and understanding of treatment plan and had no further questions at this time. P2: Hand washing and fitness

## 2023-03-08 ENCOUNTER — Inpatient Hospital Stay: Payer: No Typology Code available for payment source

## 2023-03-08 DIAGNOSIS — D509 Iron deficiency anemia, unspecified: Secondary | ICD-10-CM

## 2023-03-08 LAB — CBC WITH DIFFERENTIAL (CANCER CENTER ONLY)
Abs Immature Granulocytes: 0.01 10*3/uL (ref 0.00–0.07)
Basophils Absolute: 0 10*3/uL (ref 0.0–0.1)
Basophils Relative: 1 %
Eosinophils Absolute: 0 10*3/uL (ref 0.0–0.5)
Eosinophils Relative: 0 %
HCT: 36.6 % (ref 36.0–46.0)
Hemoglobin: 11 g/dL — ABNORMAL LOW (ref 12.0–15.0)
Immature Granulocytes: 0 %
Lymphocytes Relative: 14 %
Lymphs Abs: 0.5 10*3/uL — ABNORMAL LOW (ref 0.7–4.0)
MCH: 23.6 pg — ABNORMAL LOW (ref 26.0–34.0)
MCHC: 30.1 g/dL (ref 30.0–36.0)
MCV: 78.4 fL — ABNORMAL LOW (ref 80.0–100.0)
Monocytes Absolute: 0.5 10*3/uL (ref 0.1–1.0)
Monocytes Relative: 13 %
Neutro Abs: 2.6 10*3/uL (ref 1.7–7.7)
Neutrophils Relative %: 72 %
Platelet Count: 241 10*3/uL (ref 150–400)
RBC: 4.67 MIL/uL (ref 3.87–5.11)
WBC Count: 3.6 10*3/uL — ABNORMAL LOW (ref 4.0–10.5)
nRBC: 0 % (ref 0.0–0.2)

## 2023-03-08 LAB — IRON AND IRON BINDING CAPACITY (CC-WL,HP ONLY)
Iron: 54 ug/dL (ref 28–170)
Saturation Ratios: 15 % (ref 10.4–31.8)
TIBC: 351 ug/dL (ref 250–450)
UIBC: 297 ug/dL (ref 148–442)

## 2023-03-08 LAB — FERRITIN: Ferritin: 221 ng/mL (ref 11–307)

## 2023-03-11 ENCOUNTER — Encounter: Payer: Self-pay | Admitting: Nurse Practitioner

## 2023-03-22 ENCOUNTER — Inpatient Hospital Stay: Payer: No Typology Code available for payment source

## 2023-03-22 ENCOUNTER — Other Ambulatory Visit: Payer: Self-pay | Admitting: Hematology and Oncology

## 2023-03-22 DIAGNOSIS — D75839 Thrombocytosis, unspecified: Secondary | ICD-10-CM

## 2023-03-22 DIAGNOSIS — D509 Iron deficiency anemia, unspecified: Secondary | ICD-10-CM

## 2023-03-22 LAB — IRON AND IRON BINDING CAPACITY (CC-WL,HP ONLY)
Iron: 64 ug/dL (ref 28–170)
Saturation Ratios: 19 % (ref 10.4–31.8)
TIBC: 344 ug/dL (ref 250–450)
UIBC: 280 ug/dL (ref 148–442)

## 2023-03-22 LAB — CBC WITH DIFFERENTIAL (CANCER CENTER ONLY)
Abs Immature Granulocytes: 0.01 10*3/uL (ref 0.00–0.07)
Basophils Absolute: 0 10*3/uL (ref 0.0–0.1)
Basophils Relative: 1 %
Eosinophils Absolute: 0 10*3/uL (ref 0.0–0.5)
Eosinophils Relative: 1 %
HCT: 43.3 % (ref 36.0–46.0)
Hemoglobin: 13 g/dL (ref 12.0–15.0)
Immature Granulocytes: 0 %
Lymphocytes Relative: 16 %
Lymphs Abs: 0.7 10*3/uL (ref 0.7–4.0)
MCH: 25.1 pg — ABNORMAL LOW (ref 26.0–34.0)
MCHC: 30 g/dL (ref 30.0–36.0)
MCV: 83.6 fL (ref 80.0–100.0)
Monocytes Absolute: 0.3 10*3/uL (ref 0.1–1.0)
Monocytes Relative: 7 %
Neutro Abs: 3.5 10*3/uL (ref 1.7–7.7)
Neutrophils Relative %: 75 %
Platelet Count: 233 10*3/uL (ref 150–400)
RBC: 5.18 MIL/uL — ABNORMAL HIGH (ref 3.87–5.11)
RDW: 23.9 % — ABNORMAL HIGH (ref 11.5–15.5)
WBC Count: 4.7 10*3/uL (ref 4.0–10.5)
nRBC: 0 % (ref 0.0–0.2)

## 2023-03-22 LAB — FERRITIN: Ferritin: 47 ng/mL (ref 11–307)

## 2023-03-29 ENCOUNTER — Encounter: Payer: Self-pay | Admitting: Nurse Practitioner

## 2023-03-31 NOTE — Progress Notes (Signed)
 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 WBC 10.1, Hgb 8.0, MCV 63.7, Plt 956. Iron  studies showed Iron  16, TIBC 550, Sat 3%, Ferritin 2. 4) 04/11/19: received 1 dose of IV feraheme  510mg   5) 04/22/2019: return clinic visit. WBC 7.0, Hgb 11.6, MCV 74.5, Plt 235. Iron  265, TIBC 425, Sat 62%, Ferritin 139.  6) 06/22/2019: WBC 6.6, Hgb 12.6, Plt 450. Iron  20, TIBC 421, Iron  sat 5%, Ferritin 22.  7) 09/21/2019: WBC 6.6, Hgb 13.8, MCV 84.6, Plt 381  Interval History:  Mallory Robinson 48 y.o. female with medical history significant for iron  deficiency anemia who presents for a follow up visit. The was last seen on 02/07/2023. In the interim since her last visit she received 2 doses of IV feraheme .   On exam today Mallory Robinson notes she tolerated her 2 doses of IV Feraheme  well.  She reports that her blood pressure has been high today but she did drink some coffee.  She did take her blood pressure medication.  She reports her energy has been a 10 out of 10 so she received iron  infusions.  She reports she is not having any overt signs of bleeding, bruising, or dark stools.  She notes her menstrual cycles have been under good control and last for about 2 to 3 days without 2 tampons per day.  She notes she is taking her iron  pills faithfully though they do cause a little bit of constipation but she is able to "work around".  She does that she does do her best to try to eat iron  rich foods as well.   She has had no recent viral illness, though with the last 6 months she did have norovirus and COVID.  Otherwise she currently denies any fevers, chills, sweats, nausea, vomiting or diarrhea.  A full 10 point ROS is listed below.  MEDICAL HISTORY:  Past Medical History:  Diagnosis Date   Bipolar 1 disorder (HCC)    Complication of anesthesia    Depression    Hypertension    Low vitamin B12 level 04/11/2019   PONV (postoperative nausea and vomiting)    Stroke [facility])     SURGICAL HISTORY: Past Surgical History:  Procedure Laterality Date   CESAREAN SECTION     X2   HERNIA REPAIR     TEE WITHOUT CARDIOVERSION N/A 11/13/2017   Procedure: TRANSESOPHAGEAL ECHOCARDIOGRAM (TEE) bubble study;  Surgeon: Maudine Sos, MD;  Location: [facility] ENDOSCOPY;  Service: Cardiovascular;  Laterality: N/A;    ALLERGIES:  is allergic to food and penicillins.  MEDICATIONS:  Current Outpatient Medications  Medication Sig Dispense Refill   aspirin  EC 81 MG tablet Take 81 mg by mouth daily.     bismuth subsalicylate (PEPTO BISMOL) 262 MG/15ML suspension Take 30 mLs by mouth every 6 (six) hours as needed for diarrhea or loose stools or indigestion.     carvedilol  (COREG ) 6.25 MG tablet Take 1 tablet (6.25 mg total) by mouth 2 (two) times daily with a meal. 180 tablet 1   ferrous sulfate   325 (65 FE) MG tablet Take 1 tablet (325 mg total) by mouth 2 (two) times daily with a meal. (Patient taking differently: Take 325 mg by mouth every other day.) 60 tablet 5   No current facility-administered medications for this visit.    REVIEW OF SYSTEMS:   Constitutional: ( - ) fevers, ( - )  chills , ( - ) night sweats Eyes: ( - ) blurriness of vision, ( - ) double vision, ( - ) watery eyes Ears, nose, mouth, throat, and face: ( - ) mucositis, ( - ) sore throat Respiratory: ( - ) cough, ( - ) dyspnea, ( - ) wheezes Cardiovascular: ( - ) palpitation, ( - ) chest discomfort, ( - ) lower extremity  swelling Gastrointestinal:  ( - ) nausea, ( - ) heartburn, ( - ) change in bowel habits Skin: ( - ) abnormal skin rashes Lymphatics: ( - ) new lymphadenopathy, ( - ) easy bruising Neurological: ( - ) numbness, ( - ) tingling, ( - ) new weaknesses Behavioral/Psych: ( - ) mood change, ( - ) new changes  All other systems were reviewed with the patient and are negative.  PHYSICAL EXAMINATION: ECOG PERFORMANCE STATUS: 1 - Symptomatic but completely ambulatory  Vitals:   04/01/23 0825  BP: (!) 153/102  Pulse: 69  Resp: 13  Temp: (!) 97.2 F (36.2 C)  SpO2: 100%    Filed Weights   04/01/23 0825  Weight: 154 lb 11.2 oz (70.2 kg)     GENERAL: well appearing middle aged [place] American female. Alert, no distress and comfortable SKIN: skin color, texture, turgor are normal, no rashes or significant lesions EYES: conjunctiva are pale and non-injected, sclera clear LUNGS: clear to auscultation and percussion with normal breathing effort HEART: regular rate & rhythm and no murmurs and no lower extremity edema Musculoskeletal: no cyanosis of digits and no clubbing  PSYCH: alert & oriented x 3, fluent speech NEURO: no focal motor/sensory deficits  LABORATORY DATA:  I have reviewed the data as listed    Latest Ref Rng & Units 04/01/2023    7:44 AM 03/22/2023    7:50 AM 03/08/2023    8:02 AM  CBC  WBC 4.0 - 10.5 K/uL 3.7  4.7  3.6   Hemoglobin 12.0 - 15.0 g/dL 52.8  41.3  24.4   Hematocrit 36.0 - 46.0 % 34.6  43.3  36.6   Platelets 150 - 400 K/uL 286  233  241        Latest Ref Rng & Units 04/01/2023    7:44 AM 02/01/2023    1:01 PM 11/21/2022    9:26 AM  CMP  Glucose 70 - 99 mg/dL 010  93  97   BUN 6 - 20 mg/dL 6  8  6    Creatinine 0.44 - 1.00 mg/dL 2.72  5.36  6.44   Sodium 135 - 145 mmol/L 139  134  137   Potassium 3.5 - 5.1 mmol/L 3.8  3.5  4.9   Chloride 98 - 111 mmol/L 104  106  102   CO2 22 - 32 mmol/L 29  20  24    Calcium  8.9 - 10.3 mg/dL 9.6  9.1  9.8   Total  Protein 6.5 - 8.1 g/dL 7.5  7.3    Total Bilirubin 0.0 - 1.2 mg/dL 0.3  0.7    Alkaline Phos 38 - 126 U/L 49  37    AST 15 - 41 U/L 12  11  ALT 0 - 44 U/L 7  7      RADIOGRAPHIC STUDIES:  No results found.  ASSESSMENT & PLAN Mallory Robinson 48 y.o. female with medical history significant for iron  deficiency anemia who presents for a follow up visit.    On exam today Mallory Robinson is doing quite well and has been taking her p.o. iron . Her energy levels are good and her weight is stable. She has had regular, less heavy menstrual cycles.   On review the labs today the patient's iron  deficiency anemia had corrected and she had a normal hemoglobin, normal MCV, and her platelet count has returned to normal. Unfortunately her Hgb has dropped down to 11.4 today.  Iron  studies are still pending at the time of writing this note.  At this time would recommend that she continue p.o. iron  supplementation as long as she is menstruating.  She notes that her periods are becoming more regular once she started iron  therapy.  Overall her findings are most consistent with anemia secondary to GYN blood loss and therefore I do not think GI evaluation is warranted at this time.  We will plan to see her back in approximately 3 months time in order to assure that her iron  stores continue to be replete.   # Iron  Deficiency Anemia Secondary to GYN Blood Loss # Thrombocytosis, Secondary to Iron  Deficiency. Resolved --today will recheck CBC, CMP, retic panel, and iron  studies --continue PO iron  325mg  daily. Recommend she take this with a source of vitamin C (orange juice) to improve absorption --labs today show white blood cell 3.7, hemoglobin 10.7, MCV 83.4, platelets 286. --2 weeks ago patient had a ferritin of 47.  She notes that she just completed her menstrual cycle, this may be a drop due to that cycle. --if iron  levels appear to still be low, would recommend IV Feraheme  510 mg q. 7 days x 2 doses --continued  outpatient GYN evaluation for management of heavy menstrual cycles. No clear indication for GI evaluation at this time. --RTC in 3 months with labs 1 week before  #Constipation, stable --patient currently self treating with tea.  --recommend continued senna-docusate, with the addition of magnesium  citrate to help move her bowels PRN -- additionally can consider miralax  to help move bowels --continue to monitor   No orders of the defined types were placed in this encounter.  All questions were answered. The patient knows to call the clinic with any problems, questions or concerns.  A total of more than 30 minutes were spent on this encounter and over half of that time was spent on counseling and coordination of care as outlined above.   Rogerio Clay, MD Department of Hematology/Oncology [facility] Phone: [phone] Pager: [phone] Email: [email or web address]  04/01/2023 8:50 AM

## 2023-04-01 ENCOUNTER — Inpatient Hospital Stay: Payer: No Typology Code available for payment source | Attending: Hematology and Oncology

## 2023-04-01 ENCOUNTER — Encounter: Payer: Self-pay | Admitting: Family Medicine

## 2023-04-01 ENCOUNTER — Inpatient Hospital Stay (HOSPITAL_BASED_OUTPATIENT_CLINIC_OR_DEPARTMENT_OTHER): Payer: No Typology Code available for payment source | Admitting: Hematology and Oncology

## 2023-04-01 VITALS — BP 153/102 | HR 69 | Temp 97.2°F | Resp 13 | Wt 154.7 lb

## 2023-04-01 DIAGNOSIS — Z79899 Other long term (current) drug therapy: Secondary | ICD-10-CM | POA: Diagnosis not present

## 2023-04-01 DIAGNOSIS — D500 Iron deficiency anemia secondary to blood loss (chronic): Secondary | ICD-10-CM | POA: Insufficient documentation

## 2023-04-01 DIAGNOSIS — D75839 Thrombocytosis, unspecified: Secondary | ICD-10-CM

## 2023-04-01 DIAGNOSIS — D509 Iron deficiency anemia, unspecified: Secondary | ICD-10-CM | POA: Diagnosis not present

## 2023-04-01 DIAGNOSIS — K5900 Constipation, unspecified: Secondary | ICD-10-CM | POA: Insufficient documentation

## 2023-04-01 LAB — CMP (CANCER CENTER ONLY)
ALT: 7 U/L (ref 0–44)
AST: 12 U/L — ABNORMAL LOW (ref 15–41)
Albumin: 4.6 g/dL (ref 3.5–5.0)
Alkaline Phosphatase: 49 U/L (ref 38–126)
Anion gap: 6 (ref 5–15)
BUN: 6 mg/dL (ref 6–20)
CO2: 29 mmol/L (ref 22–32)
Calcium: 9.6 mg/dL (ref 8.9–10.3)
Chloride: 104 mmol/L (ref 98–111)
Creatinine: 0.83 mg/dL (ref 0.44–1.00)
GFR, Estimated: >60 mL/min (ref >60)
Glucose, Bld: 104 mg/dL — ABNORMAL HIGH (ref 70–99)
Potassium: 3.8 mmol/L (ref 3.5–5.1)
Sodium: 139 mmol/L (ref 135–145)
Total Bilirubin: 0.3 mg/dL (ref 0.0–1.2)
Total Protein: 7.5 g/dL (ref 6.5–8.1)

## 2023-04-01 LAB — CBC WITH DIFFERENTIAL (CANCER CENTER ONLY)
Abs Immature Granulocytes: 0.02 10*3/uL (ref 0.00–0.07)
Basophils Absolute: 0 10*3/uL (ref 0.0–0.1)
Basophils Relative: 1 %
Eosinophils Absolute: 0 10*3/uL (ref 0.0–0.5)
Eosinophils Relative: 1 %
HCT: 34.6 % — ABNORMAL LOW (ref 36.0–46.0)
Hemoglobin: 10.7 g/dL — ABNORMAL LOW (ref 12.0–15.0)
Immature Granulocytes: 1 %
Lymphocytes Relative: 19 %
Lymphs Abs: 0.7 10*3/uL (ref 0.7–4.0)
MCH: 25.8 pg — ABNORMAL LOW (ref 26.0–34.0)
MCHC: 30.9 g/dL (ref 30.0–36.0)
MCV: 83.4 fL (ref 80.0–100.0)
Monocytes Absolute: 0.2 10*3/uL (ref 0.1–1.0)
Monocytes Relative: 6 %
Neutro Abs: 2.7 10*3/uL (ref 1.7–7.7)
Neutrophils Relative %: 72 %
Platelet Count: 286 10*3/uL (ref 150–400)
RBC: 4.15 MIL/uL (ref 3.87–5.11)
RDW: 20.4 % — ABNORMAL HIGH (ref 11.5–15.5)
WBC Count: 3.7 10*3/uL — ABNORMAL LOW (ref 4.0–10.5)
nRBC: 0 % (ref 0.0–0.2)

## 2023-04-01 LAB — IRON AND IRON BINDING CAPACITY (CC-WL,HP ONLY)
Iron: 326 ug/dL — ABNORMAL HIGH (ref 28–170)
Saturation Ratios: 92 % — ABNORMAL HIGH (ref 10.4–31.8)
TIBC: 354 ug/dL (ref 250–450)
UIBC: 28 ug/dL — ABNORMAL LOW (ref 148–442)

## 2023-04-01 LAB — RETIC PANEL
Immature Retic Fract: 21.9 % — ABNORMAL HIGH (ref 2.3–15.9)
RBC.: 4.2 MIL/uL (ref 3.87–5.11)
Retic Count, Absolute: 59.6 10*3/uL (ref 19.0–186.0)
Retic Ct Pct: 1.4 % (ref 0.4–3.1)
Reticulocyte Hemoglobin: 29.1 pg (ref >27.9)

## 2023-04-01 LAB — FERRITIN: Ferritin: 16 ng/mL (ref 11–307)

## 2023-04-03 ENCOUNTER — Telehealth: Payer: Self-pay | Admitting: Pharmacy Technician

## 2023-04-03 ENCOUNTER — Telehealth: Payer: Self-pay | Admitting: *Deleted

## 2023-04-03 ENCOUNTER — Other Ambulatory Visit: Payer: Self-pay | Admitting: Hematology and Oncology

## 2023-04-03 ENCOUNTER — Encounter: Payer: Self-pay | Admitting: Registered Nurse

## 2023-04-03 ENCOUNTER — Telehealth: Payer: Self-pay | Admitting: Registered Nurse

## 2023-04-03 DIAGNOSIS — U071 COVID-19: Secondary | ICD-10-CM

## 2023-04-03 MED ORDER — MOLNUPIRAVIR EUA 200MG CAPSULE: 4.0000 | ORAL_CAPSULE | Freq: Two times a day (BID) | ORAL | 0 refills | Status: AC

## 2023-04-03 MED ORDER — COVID-19 ANTIGEN TEST VI KIT
1.0000 | PACK | Freq: Every day | 1 refills | Status: AC | PRN
Start: 2023-04-03 — End: 2023-06-02

## 2023-04-03 NOTE — Telephone Encounter (Signed)
TCT patient regarding her recent lab results.  Spoke with her. Advised that her iron levels are persistently low. I have put in a request for a repeat infusion of Feraheme x 2 to help bolster her ferritin levels. We will see her back as scheduled in May 2025. She voiced understanding and is aware of her iron infusions to be given at [facility].

## 2023-04-03 NOTE — Telephone Encounter (Signed)
Auth Submission: NO AUTH NEEDED Site of care: Site of care: [facility] Payer: MEDCOST / HEALTHY BLUE Medication & CPT/J Code(s) submitted: Feraheme (ferumoxytol) [id number] Route of submission (phone, fax, portal):  Phone # [employer] [phone] Fax # Auth type: Buy/Bill PB Units/visits requested: 2 DOSES Reference number: MEDCOST: [id number] Approval from: 04/03/23 to 08/31/23   Healthy[employer]; no auth needed

## 2023-04-03 NOTE — Telephone Encounter (Signed)
Patient contacted NP stated home covid test positive today.  04/03/23.  Day 0 03/31/23 cold symptoms congestion/runny nose.  Woke up with fever today 04/03/23 so tested for covid and scheduled appt for tomorrow for flu testing.  Asking if she should cancel tomorrow appt.  Discussed if covid test positive most likely the cause of fever but if feeling worse tomorrow/dyspnea would recommend evaluation with provider.  Discussed save covid test receipt from today as can submit for reimbursement from insurance.  Discussed insurance covers 4 free tests per month electronic Rx sent to her pharmacy of choice [facility] home covid antigen test #4 RF1.  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  Pt began quarantine today.  Recommended partner test for covid today also and monitor for symptoms x 10 days/wear mask when around others.  Patient did not develop symptoms of  trouble breathing, chest pain, nausea, vomiting, diarrhea.  Discussed with patient may return to work with mask as long as no fever/vomiting/diarrhea in previous 24 hours and symptoms improving.  Re-evaluation tomorrow since fever today.  Patient to contact clinic staff if vomiting after coughing or unable to tolerate po fluids.  Discussed flu and other viral illnesses circulating in community and some causing GI upset.  If GI upset I have recommended clear fluids then bland diet.  Avoid dairy/spicy, fried and large portions of meat while having nausea.  If vomiting hold po intake x 1 hour.  Then sips clear fluids like broths, ginger ale, power ade, gatorade, pedialyte may advance to soft/bland if no vomiting x 24 hours and appetite returned otherwise hydration main focus. Call me at work from home number if symptoms not improved with plan of care  patient to call if high fever, dehydration, marked weakness, fainting, increased abdominal pain, blood in stool or vomit (red or black).     Reviewed possible Covid symptoms  including cough, shortness of breath with exertion or at rest, runny nose, congestion, sinus pain/pressure, sore throat, fever/chills, body aches, fatigue, loss of taste/smell, GI symptoms of nausea/vomiting/diarrhea. Also reviewed same day/emergent eval/ER precautions of dizziness/syncope, confusion, blue tint to lips/face, severe shortness of breath/difficulty breathing/wheezing..  Patient to isolate in own room and if possible use only one bathroom if living with others in home.  Wear mask when out of room to help prevent spread to others in household.  Sanitize high touch surfaces with lysol/chlorox/bleach spray or wipes daily as viruses are known to live on surfaces from 24 hours to days.  Patient does want antivirals.  Patient at higher risk for hospitalization due to migraines, history CVA, bipolar, anemia regarding transfusion recently, low vitamin D and B12 level, and hypertension  Last covid infection Sep 2024.  Patient is not up to date on covid vaccines.  Recommend annual booster 60 days after infection resolution.  Patient is on prescription medications or daily medications. If taking medications epocrates interaction checker used to verify if any drug interactions. Patient molnupiravir emergency use handout sent to patient electronically along with covid quarantine exitcare handout in my chart.  Discussed how to take  molnupiravir 400mg  take 4 tabs by mouth every 12 hours x 5 days #40 RF0 electronic Rx sent to her pharmacy of choice  Discussed most common side effects GI upset and bad taste in mouth.  Use birth control/avoid getting pregnant while on molnupiravir and no breastfeeding.  Discussed I recommended not having sex with anyone while sick/testing positive/10 day quarantine as could  spread virus to partner.  Discussed with patient I would call again tomorrow to follow up symptoms/see if questions/concerns.  Exitcare handouts on covid quarantine/home care sent to patient my chart.  FDA  handout on molnupiravir sent to patient.  Patient has nasal saline/flonase/albuterol inhaler at home  May use salt water gargles and nasal saline 2 sprays each nostril q2h prn congestion/sore throat.  Research has shown it helps to prevent hospitalizations and decrease discomfort.   May use flonase nasal 1 spray each nostril BID prn rhinitis. Patient has proair inhaler at home for prn use protracted cough/wheezing/chest tightness.  Hasn't used yet .  Discussed albuterol use with patient 1-2 puffs po q4-6 hours prn.   Discussed honey 1 tablespoon every 4 hours is a natural cough suppressant.  Avoid dehydration and drink water to keep urine pale yellow clear and voiding every 2-4 hours while awake.  Patient alert and oriented x3, spoke full sentences without difficulty.  Some nasal sniffing audible during 9 minute telephone call.  No nasal congestion/cough/throat clearing/hoarse voice/wheezing/shortness of breath during call.  Discussed with patient can contact NP Inetta Fermo through my chart/[phone] when clinic closed if questions or concerns until RN returns to clinic on Thursday [id number] [phone].   Pt verbalized understanding and agreement with plan of care. No further questions/concerns at this time. Pt reminded to contact clinic with any changes in symptoms or questions/concerns. HR notified patient to work remote/quarantine re-evaluation tomorrow for RTW 2/14 if symptoms improving strict mask wear through Day 10 and no eating in employee lunch room.  Supervisor notified of excused absence 2/12-2/13/25.

## 2023-04-03 NOTE — Telephone Encounter (Signed)
-----   Message from Ulysees Barns IV sent at 04/03/2023  8:45 AM EST ----- Please let Mallory Robinson know that her iron levels are persistently low. I have put in a request for a repeat infusion of Feraheme x 2 to help bolster her ferritin levels. We will see her back as scheduled in May 2025. ----- Message ----- From: Leory Plowman, [facility] [place] Sent: 04/01/2023   8:09 AM EST To: Jaci Standard, MD

## 2023-04-04 ENCOUNTER — Ambulatory Visit: Payer: Self-pay

## 2023-04-04 NOTE — Telephone Encounter (Signed)
Spoke with patient via telephone stated picked up molnupiravir yesterday and started medication last night.  Feeling better today. Worked remote from home entire shift a little fatigued at end of day but patient reported didn't need nap and this covid infection is not as bad as last fall so far.  Denied n/v/d/f/c tolerating po intake without difficulty.  No audible cough/congestion/throat clearing during 2 minute call.  Discussed cleared to return onsite with mask through 04/13/23 unless 2 negative covid tests 24 hours apart pictures received from patient.  Use normal call out procedures if new symptoms overnight prior to shift tomorrow.  Patient to not eat at desk or in employee lunch room see HR team member for which personal room to use for eating indoors at work.  Patient spoke full sentences without difficulty.  HR team notified cleared to return onsite with mask through 04/13/23

## 2023-04-05 ENCOUNTER — Other Ambulatory Visit (HOSPITAL_COMMUNITY)
Admission: RE | Admit: 2023-04-05 | Discharge: 2023-04-05 | Disposition: A | Discharge status: Home / Self Care | Payer: No Typology Code available for payment source | Source: Ambulatory Visit | Attending: Family Medicine | Admitting: Family Medicine

## 2023-04-05 ENCOUNTER — Encounter: Payer: Self-pay | Admitting: Family Medicine

## 2023-04-05 ENCOUNTER — Ambulatory Visit (INDEPENDENT_AMBULATORY_CARE_PROVIDER_SITE_OTHER): Payer: No Typology Code available for payment source | Admitting: Family Medicine

## 2023-04-05 VITALS — BP 158/102 | HR 72 | Ht 61.0 in | Wt 153.0 lb

## 2023-04-05 DIAGNOSIS — N920 Excessive and frequent menstruation with regular cycle: Secondary | ICD-10-CM | POA: Diagnosis not present

## 2023-04-05 DIAGNOSIS — Z01419 Encounter for gynecological examination (general) (routine) without abnormal findings: Secondary | ICD-10-CM

## 2023-04-05 DIAGNOSIS — N898 Other specified noninflammatory disorders of vagina: Secondary | ICD-10-CM | POA: Insufficient documentation

## 2023-04-05 DIAGNOSIS — N760 Acute vaginitis: Secondary | ICD-10-CM | POA: Diagnosis not present

## 2023-04-05 DIAGNOSIS — B9689 Other specified bacterial agents as the cause of diseases classified elsewhere: Secondary | ICD-10-CM

## 2023-04-05 NOTE — Progress Notes (Signed)
GYNECOLOGY ANNUAL PREVENTATIVE CARE ENCOUNTER NOTE  Subjective:   Mallory Robinson is a 48 y.o. G31P1102 female here for a routine annual gynecologic exam. Medical historyf s/f CV/Stroke, HTN, OSA, microcytic anemia.    Current complaints: heavy cycles necessitating Fe infusions and transfusions (past).     Menses are regular periods every 28 days.lasting 3-4 days with heavy bleeding and clots. Reports soaking a tampon every 1-2 hours. She reports previously her cycles were 10-11 days and similarly heavy but in past year they have decreased in # of days. She reports she feels worse after cycle, notes higher BP during this time period. Works with Heme/Onc. HGB nadir was 7.1 in Dec 2024.  She has been improved received blood transfusion and get feraheme regularly. Hem suspect GYN losses account for anemia. Women in family have heavy cycles. Avg age of menopause is early 66s. Reports mother had menses until late 67s.   Vaginal irritation: notable for the 1 week before her cycle. Notes burning with urination and general irritation and sometimes odor.   Denies  pelvic pain, problems with intercourse or other gynecologic concerns.    Gynecologic History Patient's last menstrual period was 03/25/2023. Contraception: none Last Pap: years ago. Normal per patient Last mammogram: Never had  Health Maintenance Due  Topic Date Due   Pneumococcal Vaccine 65-15 Years old (1 of 2 - PCV) Never done   Cervical Cancer Screening (HPV/Pap Cotest)  Never done   Colonoscopy  Never done    The following portions of the patient's history were reviewed and updated as appropriate: allergies, current medications, past family history, past medical history, past social history, past surgical history and problem list.  Review of Systems Pertinent items are noted in HPI.   Objective:  BP (!) 158/102   Pulse 72   Ht 5\' 1"  (1.549 m)   Wt 153 lb (69.4 kg)   LMP 03/25/2023   BMI 28.91 kg/m  CONSTITUTIONAL:  Well-developed, well-nourished female in no acute distress.  HENT:  Normocephalic, atraumatic, External right and left ear normal. Oropharynx is clear and moist EYES:  No scleral icterus.  NECK: Normal range of motion, supple, no masses.  Normal thyroid.  SKIN: Skin is warm and dry. No rash noted. Not diaphoretic. No erythema. No pallor. NEUROLOGIC: Alert and oriented to person, place, and time. Normal reflexes, muscle tone coordination. No cranial nerve deficit noted. PSYCHIATRIC: Normal mood and affect. Normal behavior. Normal judgment and thought content. CARDIOVASCULAR: Normal heart rate noted, regular rhythm. 2+ distal pulses. RESPIRATORY: Effort and breath sounds normal, no problems with respiration noted. BREASTS: Symmetric in size. No masses, skin changes, nipple drainage, or lymphadenopathy. Bilateral nipple piercings. ABDOMEN: Soft,  no distention noted.  No tenderness, rebound or guarding.  PELVIC: Normal appearing external genitalia; normal appearing vaginal mucosa and cervix.  No abnormal discharge noted.  Pap smear obtained.  12wk uterine size- nodularity present, concern for fibroids.  no other palpable masses, no uterine or adnexal tenderness. Chaperone present for exam.  MUSCULOSKELETAL: Normal range of motion.    Assessment and Plan:  1) Annual gynecologic examination with pap smear:  Will follow up results of pap smear and manage accordingly. STI screening desired No.  Routine preventative health maintenance measures emphasized. Reviewed perimenopausal symptoms and management.   2) Contraception counseling: same sex partners  1. Well woman exam with routine gynecological exam (Primary) - Cytology - PAP - MM 3D SCREENING MAMMOGRAM BILATERAL BREAST; Future - Cervicovaginal ancillary only  2. Menorrhagia with  regular cycle [phone]) - Reviewed options for bleeding management - Options- IUD, RFA ablation if fibroid burden amenable. Patient wants to avoid a hysterectomy if  possible - Could also consider Rx meds [place] or [place] - has PCP visit and they will do labs-- recommend Vitamin D - targeting 50-100 for levels - Cytology - PAP - US PELVIC COMPLETE WITH TRANSVAGINAL; Future  3. Vaginal irritation [phone]) Recommended OTC methods, provided counseling and handout about BV management as I think this is most likely cause of her sx 1 wk prior to menses. Likely also an estrogen depletion aspect. Might continued vaginal estrogen if other methods are not working.  - Cervicovaginal ancillary only    Please refer to After Visit Summary for other counseling recommendations.   Return in about 2 months (around 06/03/2023) for Review [place] and planning for bleeding management.  Future Appointments  Date Time Provider Department Center  04/09/2023  3:00 PM [facility]-CHAIR 5 CH-INFWM None  04/10/2023  3:30 PM WL-US 1 WL-US [place]  04/16/2023  9:40 AM Georganna Skeans, MD PCE-PCE None  04/16/2023  3:00 PM [facility]-CHAIR 3 CH-INFWM None  04/24/2023  8:10 AM GI-BCG MM 3 GI-BCGMM GI-BREAST CE  06/28/2023  7:30 AM [facility]-MED-ONC LAB [facility]-MEDONC None  07/05/2023  8:20 AM Thayil, Nelia Shi, PA-C CH[facility]-MEDONC None    Federico Flake, MD, MPH, ABFM Attending Physician Center for [facility]

## 2023-04-05 NOTE — Progress Notes (Signed)
Patient presents for Annual.  LMP: 03/25/23 Last pap:  long time ago  Contraception:  Same sex relationship Mammogram:  Never STD Screening: Declines-gets blood transfusions so declines the blood portions  Flu Vaccine : N/A  CC:  Annual

## 2023-04-05 NOTE — Patient Instructions (Signed)
Natural Remedies for Bacterial Vaginosis  Option #1 1 Tbsp Fractitionated Coconut Oil 10 drops of Melaleuca (Tea Tree) Oil  Mix ingredients together well.  Soak 3-4 tampons (in applicators) in that mixture until all or mostly all mixture is soaked up into the tampons.  Insert 1 saturated tampon vaginally and wear overnight for 3-4 nights. Then can use weekly or every 3 days as needed for symptoms    Option #2 (sometimes to be used in conjunction with option #1) Fill tub with enough to cover lap/lower abdomen warm water.  Mix 1/2 cup of baking soda in water.  Soak in water/baking soda mixture for at least 20 minutes.  Be sure to swish water in between legs to get as much in vagina as possible.  This soak should be done after sexual intercourse and menstrual cycles.     Option #3 (sometimes to be used in conjunction with option #1 and 2) Fill tub with enough to cover lap/lower abdomen warm water.  Mix 2-4 cup of apple cider vinegar in water.  Soak in water/vinegar mixture for at least 20 minutes.  Be sure to swish water in between legs to get as much in vagina as possible.  This soak should be done after sexual intercourse and menstrual cycles.    GO WHITE: Soap: UNSCENTED Dove (white box light green writing) Laundry detergent (underwear)- Dreft or [employer] unscented WHITE 100% cotton panties (NOT just cotton crouch) Sanitary napkin/panty liners: UNSCENTED.  If it doesn't SAY unscented it can have a scent/perfume    NO PERFUMES OR LOTIONS OR POTIONS in the vulvar area (may use regular [place]) Condoms: hypoallergenic only. Non dyed (no color) Toilet papers: white only Wash clothes: use a separate wash cloth. WHITE.  Wash in [place].   You can purchase Tea Tree Oil locally at:  [employer] [address] [place] [address]  [employer] [address] [place] [address]  Advise that these alternatives will not replace the need to be evaluated if symptoms  persist. You will need to seek care at an OB/GYN provider.

## 2023-04-08 LAB — CYTOLOGY - PAP
Chlamydia: NEGATIVE
Comment: NEGATIVE
Comment: NEGATIVE
Comment: NEGATIVE
Comment: NORMAL
Diagnosis: NEGATIVE
High risk HPV: NEGATIVE
Neisseria Gonorrhea: NEGATIVE
Trichomonas: NEGATIVE

## 2023-04-08 LAB — CERVICOVAGINAL ANCILLARY ONLY
Bacterial Vaginitis (gardnerella): POSITIVE — AB
Candida Glabrata: NEGATIVE
Candida Vaginitis: NEGATIVE
Chlamydia: NEGATIVE
Comment: NEGATIVE
Comment: NEGATIVE
Comment: NEGATIVE
Comment: NEGATIVE
Comment: NEGATIVE
Comment: NORMAL
Neisseria Gonorrhea: NEGATIVE
Trichomonas: NEGATIVE

## 2023-04-09 ENCOUNTER — Encounter: Payer: Self-pay | Admitting: Family Medicine

## 2023-04-09 ENCOUNTER — Ambulatory Visit: Payer: No Typology Code available for payment source

## 2023-04-09 VITALS — BP 148/97 | HR 66 | Temp 98.2°F | Resp 18 | Ht 60.0 in | Wt 154.6 lb

## 2023-04-09 DIAGNOSIS — D509 Iron deficiency anemia, unspecified: Secondary | ICD-10-CM

## 2023-04-09 DIAGNOSIS — D500 Iron deficiency anemia secondary to blood loss (chronic): Secondary | ICD-10-CM

## 2023-04-09 MED ORDER — FERUMOXYTOL INJECTION 510 MG/17 ML
510.0000 mg | Freq: Once | INTRAVENOUS | Status: AC
  Administered 2023-04-09: 510 mg via INTRAVENOUS
  Filled 2023-04-09: qty 17

## 2023-04-09 MED ORDER — METRONIDAZOLE 500 MG PO TABS
500.0000 mg | ORAL_TABLET | Freq: Two times a day (BID) | ORAL | 0 refills | Status: AC
Start: 2023-04-09 — End: ?

## 2023-04-09 MED ORDER — ACETAMINOPHEN 325 MG PO TABS
650.0000 mg | ORAL_TABLET | Freq: Once | ORAL | Status: AC
  Administered 2023-04-09: 650 mg via ORAL
  Filled 2023-04-09: qty 2

## 2023-04-09 MED ORDER — DIPHENHYDRAMINE HCL 25 MG PO CAPS
25.0000 mg | ORAL_CAPSULE | Freq: Once | ORAL | Status: AC
  Administered 2023-04-09: 25 mg via ORAL
  Filled 2023-04-09: qty 1

## 2023-04-09 NOTE — Addendum Note (Signed)
 Addended by: Geanie Berlin on: 04/09/2023 11:18 AM   Modules accepted: Orders

## 2023-04-09 NOTE — Progress Notes (Signed)
 Diagnosis: Iron Deficiency Anemia  Provider:  Chilton Greathouse MD  Procedure: IV Infusion  IV Type: Peripheral, IV Location: R Forearm  Feraheme (Ferumoxytol), Dose: 510 mg  Infusion Start Time: 1557  Infusion Stop Time: 1611  Post Infusion IV Care: Observation period completed and Peripheral IV Discontinued  Discharge: Condition: Good, Destination: Home . AVS Declined  Performed by:  Marlow Baars [place], RN

## 2023-04-10 ENCOUNTER — Ambulatory Visit (HOSPITAL_COMMUNITY): Admission: RE | Admit: 2023-04-10 | Payer: No Typology Code available for payment source | Source: Ambulatory Visit

## 2023-04-10 NOTE — Telephone Encounter (Signed)
 Spoke with patient in workcenter she stated trying to wear her mask but feeling restricted with breathing.  Hematology contacted her and she needs to having another blood transfusion tomorrow.  She has vaginal [place] for uterine fibroids scheduled with GYN also still trying to determine her cause of anemia.  She stated menses not always heavy menses.  Denied blood in urine/stool or other known blood loss.  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  She stated symptoms have improved otherwise and denied new or worsening URI symptoms.  A&Ox3 respirations even and unlabored RA skin warm dry and pink

## 2023-04-13 ENCOUNTER — Ambulatory Visit (HOSPITAL_COMMUNITY)
Admission: RE | Admit: 2023-04-13 | Discharge: 2023-04-13 | Disposition: A | Discharge status: Home / Self Care | Payer: No Typology Code available for payment source | Source: Ambulatory Visit | Attending: Family Medicine | Admitting: Family Medicine

## 2023-04-13 DIAGNOSIS — N920 Excessive and frequent menstruation with regular cycle: Secondary | ICD-10-CM | POA: Insufficient documentation

## 2023-04-13 DIAGNOSIS — D259 Leiomyoma of uterus, unspecified: Secondary | ICD-10-CM | POA: Diagnosis not present

## 2023-04-13 DIAGNOSIS — N854 Malposition of uterus: Secondary | ICD-10-CM | POA: Diagnosis not present

## 2023-04-13 DIAGNOSIS — N939 Abnormal uterine and vaginal bleeding, unspecified: Secondary | ICD-10-CM | POA: Diagnosis not present

## 2023-04-16 ENCOUNTER — Ambulatory Visit (INDEPENDENT_AMBULATORY_CARE_PROVIDER_SITE_OTHER): Payer: No Typology Code available for payment source

## 2023-04-16 ENCOUNTER — Ambulatory Visit (INDEPENDENT_AMBULATORY_CARE_PROVIDER_SITE_OTHER): Payer: No Typology Code available for payment source | Admitting: Family Medicine

## 2023-04-16 VITALS — BP 138/90 | HR 77 | Temp 98.3°F | Resp 18 | Ht 60.0 in | Wt 153.8 lb

## 2023-04-16 VITALS — BP 155/99 | HR 60 | Temp 98.1°F | Resp 16 | Ht 60.0 in | Wt 153.0 lb

## 2023-04-16 DIAGNOSIS — D500 Iron deficiency anemia secondary to blood loss (chronic): Secondary | ICD-10-CM | POA: Diagnosis not present

## 2023-04-16 DIAGNOSIS — D509 Iron deficiency anemia, unspecified: Secondary | ICD-10-CM | POA: Diagnosis not present

## 2023-04-16 DIAGNOSIS — I10 Essential (primary) hypertension: Secondary | ICD-10-CM | POA: Diagnosis not present

## 2023-04-16 MED ORDER — ACETAMINOPHEN 325 MG PO TABS
650.0000 mg | ORAL_TABLET | Freq: Once | ORAL | Status: AC
  Administered 2023-04-16: 650 mg via ORAL
  Filled 2023-04-16: qty 2

## 2023-04-16 MED ORDER — DIPHENHYDRAMINE HCL 25 MG PO CAPS
25.0000 mg | ORAL_CAPSULE | Freq: Once | ORAL | Status: AC
  Administered 2023-04-16: 25 mg via ORAL
  Filled 2023-04-16: qty 1

## 2023-04-16 MED ORDER — LISINOPRIL 10 MG PO TABS
10.0000 mg | ORAL_TABLET | Freq: Every day | ORAL | 0 refills | Status: DC
Start: 2023-04-16 — End: 2023-06-27

## 2023-04-16 MED ORDER — SODIUM CHLORIDE 0.9 % IV SOLN
510.0000 mg | Freq: Once | INTRAVENOUS | Status: AC
  Administered 2023-04-16: 510 mg via INTRAVENOUS
  Filled 2023-04-16: qty 17

## 2023-04-16 NOTE — Progress Notes (Signed)
 Diagnosis: Iron Deficiency Anemia  Provider:  Chilton Greathouse MD  Procedure: IV Infusion  IV Type: Peripheral, IV Location: R Antecubital  Feraheme (Ferumoxytol), Dose: 510 mg  Infusion Start Time: 1553  Infusion Stop Time: 1611  Post Infusion IV Care: Observation period completed and Peripheral IV Discontinued  Discharge: Condition: Good, Destination: Home . AVS Declined  Performed by:  Adriana Mccallum, RN

## 2023-04-16 NOTE — Progress Notes (Unsigned)
 Patient is her for concerns of HTN. Patient thinks she may need other medication

## 2023-04-17 ENCOUNTER — Encounter: Payer: Self-pay | Admitting: Family Medicine

## 2023-04-18 ENCOUNTER — Encounter: Payer: Self-pay | Admitting: Family Medicine

## 2023-04-18 NOTE — Progress Notes (Signed)
 Established Patient Office Visit  Subjective    Patient ID: Mallory Robinson, female    DOB: 1975-11-23  Age: 48 y.o. MRN: [id number]  CC:  Chief Complaint  Patient presents with   Medical Management of Chronic Issues    HPI Mallory Robinson presents for chronic med issues including hypertension and anemia.   Outpatient Encounter Medications as of 04/16/2023  Medication Sig   aspirin EC 81 MG tablet Take 81 mg by mouth daily.   carvedilol (COREG) 6.25 MG tablet Take 1 tablet (6.25 mg total) by mouth 2 (two) times daily with a meal.   ferrous sulfate 325 (65 FE) MG tablet Take 1 tablet (325 mg total) by mouth 2 (two) times daily with a meal. (Patient taking differently: Take 325 mg by mouth every other day.)   lisinopril (ZESTRIL) 10 MG tablet Take 1 tablet (10 mg total) by mouth daily.   COVID-19 Antigen Test KIT Place 1 Device into the nose daily as needed. (Patient not taking: Reported on 04/16/2023)   metroNIDAZOLE (FLAGYL) 500 MG tablet Take 1 tablet (500 mg total) by mouth 2 (two) times daily. (Patient not taking: Reported on 04/16/2023)   [EXPIRED] acetaminophen (TYLENOL) tablet 650 mg    [EXPIRED] diphenhydrAMINE (BENADRYL) capsule 25 mg    No facility-administered encounter medications on file as of 04/16/2023.    Past Medical History:  Diagnosis Date   Bipolar 1 disorder (HCC)    Complication of anesthesia    Depression    Hypertension    Low vitamin B12 level 04/11/2019   PONV (postoperative nausea and vomiting)    Stroke [facility])     Past Surgical History:  Procedure Laterality Date   CESAREAN SECTION     X2   HERNIA REPAIR     TEE WITHOUT CARDIOVERSION N/A 11/13/2017   Procedure: TRANSESOPHAGEAL ECHOCARDIOGRAM (TEE) bubble study;  Surgeon: Chilton Si, MD;  Location: [facility] ENDOSCOPY;  Service: Cardiovascular;  Laterality: N/A;    Family History  Problem Relation Age of Onset   Hypertension Mother    Stroke Father    Stroke Maternal Aunt     Social History    Socioeconomic History   Marital status: Married    Spouse name: Not on file   Number of children: Not on file   Years of education: Not on file   Highest education level: 12th grade  Occupational History   Not on file  Tobacco Use   Smoking status: Never   Smokeless tobacco: Never  Vaping Use   Vaping status: Never Used  Substance and Sexual Activity   Alcohol use: Not Currently    Comment: none in 2 years -06/08/2021   Drug use: No   Sexual activity: Yes    Birth control/protection: None  Other Topics Concern   Not on file  Social History Narrative   Not on file   Social Drivers of Health   Financial Resource Strain: Low Risk  (04/12/2023)   Overall Financial Resource Strain (CARDIA)    Difficulty of Paying Living Expenses: Not hard at all  Food Insecurity: Food Insecurity Present (04/12/2023)   Hunger Vital Sign    Worried About Running Out of Food in the Last Year: Sometimes true    Ran Out of Food in the Last Year: Often true  Transportation Needs: No Transportation Needs (04/12/2023)   PRAPARE - Administrator, Civil Service (Medical): No    Lack of Transportation (Non-Medical): No  Physical Activity: Insufficiently Active (  04/12/2023)   Exercise Vital Sign    Days of Exercise per Week: 2 days    Minutes of Exercise per Session: 10 min  Stress: No Stress Concern Present (04/12/2023)   [employer] of Occupational Health - Occupational Stress Questionnaire    Feeling of Stress : Only a little  Social Connections: Socially Isolated (04/12/2023)   Social Connection and Isolation Panel [NHANES]    Frequency of Communication with Friends and Family: More than three times a week    Frequency of Social Gatherings with Friends and Family: Three times a week    Attends Religious Services: Never    Active Member of Clubs or Organizations: No    Attends Banker Meetings: Not on file    Marital Status: Separated  Intimate Partner Violence: Low  Risk  (08/30/2020)   Received from [facility], [facility]   Intimate Partner Violence    Insults You: Not on file    Threatens You: Not on file    Screams at [place]: Not on file    Physically Hurt: Not on file    Intimate Partner Violence Score: Not on file    Review of Systems  All other systems reviewed and are negative.       Objective    BP (!) 155/99   Pulse 60   Temp 98.1 F (36.7 C) (Oral)   Resp 16   Ht 5' (1.524 m)   Wt 153 lb (69.4 kg)   LMP 03/25/2023   SpO2 99%   BMI 29.88 kg/m   Physical Exam Vitals and nursing note reviewed.  Constitutional:      General: She is not in acute distress. HENT:     Head: Normocephalic and atraumatic.  Cardiovascular:     Rate and Rhythm: Normal rate and regular rhythm.  Pulmonary:     Effort: Pulmonary effort is normal.     Breath sounds: Normal breath sounds.  Abdominal:     Palpations: Abdomen is soft.     Tenderness: There is no abdominal tenderness.  Neurological:     General: No focal deficit present.     Mental Status: She is alert and oriented to person, place, and time.  Psychiatric:        Mood and Affect: Mood normal.        Behavior: Behavior normal.         Assessment & Plan:  1. Essential hypertension (Primary) Elevated readings. Lisinopril 10 mg prescribed.   2. Iron deficiency anemia due to chronic blood loss Continue ferritin   Return in about 4 weeks (around 05/14/2023) for follow up.   Tommie Raymond, MD

## 2023-04-24 ENCOUNTER — Ambulatory Visit: Payer: No Typology Code available for payment source

## 2023-05-03 ENCOUNTER — Ambulatory Visit: Payer: No Typology Code available for payment source | Admitting: Family Medicine

## 2023-05-03 ENCOUNTER — Other Ambulatory Visit (HOSPITAL_COMMUNITY)
Admission: RE | Admit: 2023-05-03 | Discharge: 2023-05-03 | Disposition: A | Discharge status: Home / Self Care | Source: Ambulatory Visit | Attending: Family Medicine | Admitting: Family Medicine

## 2023-05-03 ENCOUNTER — Encounter: Payer: Self-pay | Admitting: Family Medicine

## 2023-05-03 VITALS — BP 138/92 | HR 63 | Wt 154.0 lb

## 2023-05-03 DIAGNOSIS — Z30430 Encounter for insertion of intrauterine contraceptive device: Secondary | ICD-10-CM | POA: Diagnosis not present

## 2023-05-03 DIAGNOSIS — N939 Abnormal uterine and vaginal bleeding, unspecified: Secondary | ICD-10-CM

## 2023-05-03 MED ORDER — LEVONORGESTREL 20 MCG/DAY IU IUD
1.0000 | INTRAUTERINE_SYSTEM | Freq: Once | INTRAUTERINE | Status: AC
  Administered 2023-05-03: 1 via INTRAUTERINE

## 2023-05-03 NOTE — Progress Notes (Signed)
 RGYN here for IUD insertion.   Last pap: 04/05/23. LMP: 04/29/23

## 2023-05-03 NOTE — Progress Notes (Signed)
   GYNECOLOGY CLINIC- IUD Insertion  History:  48 y.o. Mallory Robinson here today for today for IUD insertion, planning LNG IUD which will help with heavy cycles. She also reports she has been spotting for about 1 month since getting her Fe infusion. She reports she typically would have heavy cycles but discrete periods of no bleeding but this was not true this month.   The following portions of the patient's history were reviewed and updated as appropriate: allergies, current medications, past family history, past medical history, past social history, past surgical history and problem list. Last pap smear on 04/05/23 was normal, neg HRHPV.  Review of Systems:  Pertinent items are noted in HPI.   Objective:  Physical Exam Blood pressure (!) 138/92, pulse 63, weight 154 lb (69.9 kg), last menstrual period 04/29/2023. Gen: NAD Abd: Soft, nontender and nondistended Pelvic: Normal appearing external genitalia; normal appearing vaginal mucosa and cervix.  IUD strings visualized, about 3 cm in length outside cervix after procedure.   ENDOMETRIAL BIOPSY     The indications for endometrial biopsy were reviewed.   Risks of the biopsy including cramping, bleeding, infection, uterine perforation, inadequate specimen and need for additional procedures  were discussed. The patient states she understands and agrees to undergo procedure today. Consent was signed. Time out was performed. Urine HCG was negative. During the pelvic exam, the cervix was prepped with Betadine. A single-toothed tenaculum was placed on the anterior lip of the cervix to stabilize it. The 3 mm pipelle was introduced into the endometrial cavity without difficulty to a depth of 9cm, and a moderate amount of tissue was obtained and sent to pathology. The instruments were removed from the patient's vagina. Minimal bleeding from the cervix was noted. The patient tolerated the procedure well. Routine post-procedure instructions were given to the  patient.    IUD Insertion Procedure Note Patient identified, informed consent performed, consent signed.   Discussed risks of irregular bleeding, cramping, infection, malpositioning or misplacement of the IUD outside the uterus which may require further procedure such as laparoscopy. Time out was performed.  Urine pregnancy test negative.  Speculum placed in the vagina.  Cervix visualized.  Cleaned with Betadine x 2.  Grasped anteriorly with a single tooth tenaculum.  Uterus sounded to 9 cm.  IUD placed per manufacturer's recommendations.  Strings trimmed to 3 cm. Tenaculum was removed, good hemostasis noted.  Patient tolerated procedure well.   Assessment & Plan:   Patient was given post-procedure instructions.  Patient was also asked to check IUD strings periodically and follow up in 5 weeks for IUD check. Reviewed endometrial bx results will return in 5-7 days typically. Reviewed possible options for results and reasons for checking.    Future Appointments  Date Time Provider Department Center  05/14/2023  8:20 AM Georganna Skeans, MD [facility] None  06/28/2023  7:30 AM [facility] None  07/01/2023  9:00 AM [facility] MM 3 [facility] CE  07/05/2023  8:20 AM Thayil, Nelia Shi, PA-C [facility] None    Federico Flake, MD  [facility] for [employer], [facility]

## 2023-05-06 LAB — SURGICAL PATHOLOGY

## 2023-05-07 ENCOUNTER — Encounter: Payer: Self-pay | Admitting: Family Medicine

## 2023-05-14 ENCOUNTER — Ambulatory Visit (INDEPENDENT_AMBULATORY_CARE_PROVIDER_SITE_OTHER): Payer: No Typology Code available for payment source | Admitting: Family Medicine

## 2023-05-14 ENCOUNTER — Encounter: Payer: Self-pay | Admitting: Family Medicine

## 2023-05-14 VITALS — BP 135/95 | HR 70 | Temp 98.5°F | Resp 16 | Ht 61.0 in | Wt 158.2 lb

## 2023-05-14 DIAGNOSIS — I10 Essential (primary) hypertension: Secondary | ICD-10-CM | POA: Diagnosis not present

## 2023-05-14 MED ORDER — HYDROCHLOROTHIAZIDE 25 MG PO TABS: 25.0000 mg | ORAL_TABLET | Freq: Every day | ORAL | 0 refills | Status: DC

## 2023-05-15 ENCOUNTER — Encounter: Payer: Self-pay | Admitting: Family Medicine

## 2023-05-15 NOTE — Progress Notes (Signed)
 Established Patient Office Visit  Subjective    Patient ID: Mallory Robinson, female    DOB: [birth year]  Age: 48 y.o. MRN: [id number]  CC:  Chief Complaint  Patient presents with   Follow-up    B/P    HPI Mallory Robinson presents for follow up of hypertension. Patient reports med compliance  and denies acute compliaints.   Outpatient Encounter Medications as of 05/14/2023  Medication Sig   aspirin EC 81 MG tablet Take 81 mg by mouth daily.   carvedilol (COREG) 6.25 MG tablet Take 1 tablet (6.25 mg total) by mouth 2 (two) times daily with a meal.   hydrochlorothiazide (HYDRODIURIL) 25 MG tablet Take 1 tablet (25 mg total) by mouth daily.   lisinopril (ZESTRIL) 10 MG tablet Take 1 tablet (10 mg total) by mouth daily.   COVID-19 Antigen Test KIT Place 1 Device into the nose daily as needed. (Patient not taking: Reported on 04/05/2023)   ferrous sulfate 325 (65 FE) MG tablet Take 1 tablet (325 mg total) by mouth 2 (two) times daily with a meal. (Patient taking differently: Take 325 mg by mouth every other day.)   metroNIDAZOLE (FLAGYL) 500 MG tablet Take 1 tablet (500 mg total) by mouth 2 (two) times daily. (Patient not taking: Reported on 04/16/2023)   No facility-administered encounter medications on file as of 05/14/2023.    Past Medical History:  Diagnosis Date   Bipolar 1 disorder (HCC)    Complication of anesthesia    Depression    Hypertension    Low vitamin B12 level 04/11/2019   PONV (postoperative nausea and vomiting)    Stroke [facility])     Past Surgical History:  Procedure Laterality Date   CESAREAN SECTION     X2   HERNIA REPAIR     TEE WITHOUT CARDIOVERSION N/A 11/13/2017   Procedure: TRANSESOPHAGEAL ECHOCARDIOGRAM (TEE) bubble study;  Surgeon: Chilton Si, MD;  Location: [facility] ENDOSCOPY;  Service: Cardiovascular;  Laterality: N/A;    Family History  Problem Relation Age of Onset   Hypertension Mother    Stroke Father    Stroke Maternal Aunt     Social History    Socioeconomic History   Marital status: Married    Spouse name: Not on file   Number of children: Not on file   Years of education: Not on file   Highest education level: 12th grade  Occupational History   Not on file  Tobacco Use   Smoking status: Never   Smokeless tobacco: Never  Vaping Use   Vaping status: Never Used  Substance and Sexual Activity   Alcohol use: Not Currently    Comment: none in 2 years -06/08/2021   Drug use: No   Sexual activity: Yes    Birth control/protection: None  Other Topics Concern   Not on file  Social History Narrative   Not on file   Social Drivers of Health   Financial Resource Strain: Low Risk  (04/12/2023)   Overall Financial Resource Strain (CARDIA)    Difficulty of Paying Living Expenses: Not hard at all  Food Insecurity: Food Insecurity Present (04/12/2023)   Hunger Vital Sign    Worried About Running Out of Food in the Last Year: Sometimes true    Ran Out of Food in the Last Year: Often true  Transportation Needs: No Transportation Needs (04/12/2023)   PRAPARE - Administrator, Civil Service (Medical): No    Lack of Transportation (Non-Medical): No  Physical Activity: Insufficiently Active (04/12/2023)   Exercise Vital Sign    Days of Exercise per Week: 2 days    Minutes of Exercise per Session: 10 min  Stress: No Stress Concern Present (04/12/2023)   [employer] of Occupational Health - Occupational Stress Questionnaire    Feeling of Stress : Only a little  Social Connections: Socially Isolated (04/12/2023)   Social Connection and Isolation Panel [NHANES]    Frequency of Communication with Friends and Family: More than three times a week    Frequency of Social Gatherings with Friends and Family: Three times a week    Attends Religious Services: Never    Active Member of Clubs or Organizations: No    Attends Banker Meetings: Not on file    Marital Status: Separated  Intimate Partner Violence: Low  Risk  (08/30/2020)   Received from [facility], [facility]   Intimate Partner Violence    Insults You: Not on file    Threatens You: Not on file    Screams at [place]: Not on file    Physically Hurt: Not on file    Intimate Partner Violence Score: Not on file    Review of Systems  All other systems reviewed and are negative.       Objective    BP (!) 135/95   Pulse 70   Temp 98.5 F (36.9 C) (Oral)   Resp 16   Ht 5\' 1"  (1.549 m)   Wt 158 lb 3.2 oz (71.8 kg)   LMP 04/29/2023 (Approximate)   SpO2 99%   BMI 29.89 kg/m   Physical Exam Vitals and nursing note reviewed.  Constitutional:      General: She is not in acute distress. HENT:     Head: Normocephalic and atraumatic.  Cardiovascular:     Rate and Rhythm: Normal rate and regular rhythm.  Pulmonary:     Effort: Pulmonary effort is normal.     Breath sounds: Normal breath sounds.  Abdominal:     Palpations: Abdomen is soft.     Tenderness: There is no abdominal tenderness.  Neurological:     General: No focal deficit present.     Mental Status: She is alert and oriented to person, place, and time.  Psychiatric:        Mood and Affect: Mood normal.        Behavior: Behavior normal.         Assessment & Plan:   1. Essential hypertension (Primary) Improved but readings remain some elevated. Will add hydrochlorothiazide 25 mg to regimen.    Return in about 4 weeks (around 06/11/2023) for follow up, chronic med issues.   Tommie Raymond, MD

## 2023-05-28 ENCOUNTER — Encounter (HOSPITAL_BASED_OUTPATIENT_CLINIC_OR_DEPARTMENT_OTHER): Payer: Self-pay | Admitting: Emergency Medicine

## 2023-05-28 ENCOUNTER — Emergency Department (HOSPITAL_BASED_OUTPATIENT_CLINIC_OR_DEPARTMENT_OTHER)

## 2023-05-28 ENCOUNTER — Emergency Department (HOSPITAL_BASED_OUTPATIENT_CLINIC_OR_DEPARTMENT_OTHER)
Admission: EM | Admit: 2023-05-28 | Discharge: 2023-05-28 | Discharge status: Against Medical Advice | Attending: Emergency Medicine | Admitting: Emergency Medicine

## 2023-05-28 ENCOUNTER — Other Ambulatory Visit: Payer: Self-pay

## 2023-05-28 DIAGNOSIS — R110 Nausea: Secondary | ICD-10-CM | POA: Diagnosis not present

## 2023-05-28 DIAGNOSIS — Z5329 Procedure and treatment not carried out because of patient's decision for other reasons: Secondary | ICD-10-CM | POA: Insufficient documentation

## 2023-05-28 DIAGNOSIS — R531 Weakness: Secondary | ICD-10-CM | POA: Diagnosis not present

## 2023-05-28 DIAGNOSIS — I639 Cerebral infarction, unspecified: Secondary | ICD-10-CM | POA: Insufficient documentation

## 2023-05-28 DIAGNOSIS — I10 Essential (primary) hypertension: Secondary | ICD-10-CM | POA: Diagnosis not present

## 2023-05-28 DIAGNOSIS — R42 Dizziness and giddiness: Secondary | ICD-10-CM

## 2023-05-28 DIAGNOSIS — Z7982 Long term (current) use of aspirin: Secondary | ICD-10-CM | POA: Insufficient documentation

## 2023-05-28 LAB — APTT: aPTT: 26 s (ref 24–36)

## 2023-05-28 LAB — CBC
HCT: 42.1 % (ref 36.0–46.0)
Hemoglobin: 13.7 g/dL (ref 12.0–15.0)
MCH: 27.8 pg (ref 26.0–34.0)
MCHC: 32.5 g/dL (ref 30.0–36.0)
MCV: 85.4 fL (ref 80.0–100.0)
Platelets: 353 10*3/uL (ref 150–400)
RBC: 4.93 MIL/uL (ref 3.87–5.11)
RDW: 14.8 % (ref 11.5–15.5)
WBC: 7.5 10*3/uL (ref 4.0–10.5)
nRBC: 0 % (ref 0.0–0.2)

## 2023-05-28 LAB — ETHANOL: Alcohol, Ethyl (B): <10 mg/dL (ref <10)

## 2023-05-28 LAB — PROTIME-INR
INR: 0.9 (ref 0.8–1.2)
Prothrombin Time: 12.7 s (ref 11.4–15.2)

## 2023-05-28 LAB — DIFFERENTIAL
Abs Immature Granulocytes: 0.03 10*3/uL (ref 0.00–0.07)
Basophils Absolute: 0.1 10*3/uL (ref 0.0–0.1)
Basophils Relative: 1 %
Eosinophils Absolute: 0.1 10*3/uL (ref 0.0–0.5)
Eosinophils Relative: 1 %
Immature Granulocytes: 0 %
Lymphocytes Relative: 18 %
Lymphs Abs: 1.4 10*3/uL (ref 0.7–4.0)
Monocytes Absolute: 0.8 10*3/uL (ref 0.1–1.0)
Monocytes Relative: 10 %
Neutro Abs: 5.2 10*3/uL (ref 1.7–7.7)
Neutrophils Relative %: 70 %

## 2023-05-28 LAB — COMPREHENSIVE METABOLIC PANEL WITH GFR
ALT: 7 U/L (ref 0–44)
AST: 11 U/L — ABNORMAL LOW (ref 15–41)
Albumin: 4.6 g/dL (ref 3.5–5.0)
Alkaline Phosphatase: 60 U/L (ref 38–126)
Anion gap: 9 (ref 5–15)
BUN: 15 mg/dL (ref 6–20)
CO2: 26 mmol/L (ref 22–32)
Calcium: 9.9 mg/dL (ref 8.9–10.3)
Chloride: 101 mmol/L (ref 98–111)
Creatinine, Ser: 0.79 mg/dL (ref 0.44–1.00)
GFR, Estimated: >60 mL/min (ref >60)
Glucose, Bld: 103 mg/dL — ABNORMAL HIGH (ref 70–99)
Potassium: 3.6 mmol/L (ref 3.5–5.1)
Sodium: 136 mmol/L (ref 135–145)
Total Bilirubin: 0.2 mg/dL (ref 0.0–1.2)
Total Protein: 7.4 g/dL (ref 6.5–8.1)

## 2023-05-28 LAB — CBG MONITORING, ED: Glucose-Capillary: 106 mg/dL — ABNORMAL HIGH (ref 70–99)

## 2023-05-28 MED ORDER — ASPIRIN EC 81 MG PO TBEC: 81.0000 mg | DELAYED_RELEASE_TABLET | Freq: Every day | ORAL | 0 refills | Status: AC

## 2023-05-28 MED ORDER — SODIUM CHLORIDE 0.9% FLUSH: 3.0000 mL | Freq: Once | INTRAVENOUS | Status: DC

## 2023-05-28 MED ORDER — CLOPIDOGREL BISULFATE 75 MG PO TABS
75.0000 mg | ORAL_TABLET | Freq: Once | ORAL | Status: AC
  Administered 2023-05-28: 75 mg via ORAL
  Filled 2023-05-28: qty 1

## 2023-05-28 MED ORDER — IOHEXOL 350 MG/ML SOLN
100.0000 mL | Freq: Once | INTRAVENOUS | Status: AC | PRN
  Administered 2023-05-28: 75 mL via INTRAVENOUS

## 2023-05-28 MED ORDER — ASPIRIN 81 MG PO CHEW
81.0000 mg | CHEWABLE_TABLET | Freq: Once | ORAL | Status: AC
Start: 2023-05-28 — End: 2023-05-28
  Administered 2023-05-28: 81 mg via ORAL
  Filled 2023-05-28: qty 1

## 2023-05-28 MED ORDER — CLOPIDOGREL BISULFATE 75 MG PO TABS: 75.0000 mg | ORAL_TABLET | Freq: Every day | ORAL | 2 refills | Status: AC

## 2023-05-28 NOTE — ED Notes (Signed)
 Return from CT

## 2023-05-28 NOTE — Consult Note (Signed)
 NEUROLOGY TELESTROKE CONSULT NOTE   Date of service: May 28, 2023 Patient Name: Mallory Robinson MRN:  [id number] DOB:  1975/07/09 Chief Complaint: L sided weakness and numbness Requesting Provider: Alvira Monday, MD  Consult Participants: myself, patient, bedside RN, telestroke RN Location of the provider: [facility], [place] Location of the patient: [facility]  This consult was provided via telemedicine with 2-way video and audio communication. The patient/family was informed that care would be provided in this way and agreed to receive care in this manner.   History of Present Illness   This is a 48 year old woman with past medical history significant for bipolar disorder, hypertension, B12 deficiency, stroke in 2019 and 2021 who presents with left-sided weakness and numbness.  Her last known well was 1600 today when she began to feel lightheaded and weak and numb on her left side.  She has very slight baseline right-sided weakness and numbness from her prior strokes.  NIH stroke scale was 1 for drift in her left leg.  CT head showed no acute process on personal review.  CTA showed no LVO.  TNK was not administered due to symptoms being too mild to treat.  LKW: 1600 Modified rankin score: 1-No significant post stroke disability and can perform usual duties with stroke symptoms IV Thrombolysis:no too mild to treat  NIHSS components Score: Comment  1a Level of Conscious 0[]  1[]  2[]  3[]      1b LOC Questions 0[]  1[]  2[]       1c LOC Commands 0[]  1[]  2[]       2 Best Gaze 0[]  1[]  2[]       3 Visual 0[]  1[]  2[]  3[]      4 Facial Palsy 0[]  1[]  2[]  3[]      5a Motor Arm - left 0[]  1[]  2[]  3[]  4[]  UN[]    5b Motor Arm - Right 0[]  1[]  2[]  3[]  4[]  UN[]    6a Motor Leg - Left 0[]  1[x]  2[]  3[]  4[]  UN[]    6b Motor Leg - Right 0[]  1[]  2[]  3[]  4[]  UN[]    7 Limb Ataxia 0[]  1[]  2[]  3[]  UN[]     8 Sensory 0[]  1[]  2[]  UN[]      9 Best Language 0[]  1[]  2[]  3[]      10 Dysarthria 0[]  1[]  2[]  UN[]      11 Extinct. and  Inattention 0[]  1[]  2[]       TOTAL:  1      ROS  Comprehensive ROS performed and pertinent positives documented in HPI   Past History   Past Medical History:  Diagnosis Date   Bipolar 1 disorder (HCC)    Complication of anesthesia    Depression    Hypertension    Low vitamin B12 level 04/11/2019   PONV (postoperative nausea and vomiting)    Stroke [facility])     Past Surgical History:  Procedure Laterality Date   CESAREAN SECTION     X2   HERNIA REPAIR     TEE WITHOUT CARDIOVERSION N/A 11/13/2017   Procedure: TRANSESOPHAGEAL ECHOCARDIOGRAM (TEE) bubble study;  Surgeon: Chilton Si, MD;  Location: [facility] ENDOSCOPY;  Service: Cardiovascular;  Laterality: N/A;    Family History: Family History  Problem Relation Age of Onset   Hypertension Mother    Stroke Father    Stroke Maternal Aunt     Social History  reports that she has never smoked. She has never used smokeless tobacco. She reports that she does not currently use alcohol. She reports that she does not use drugs.  Allergies  Allergen  Reactions   Food Itching, Swelling and Other (See Comments)    Melons : Reaction to Melons (lips and eyes became swollen)   Penicillins Hives, Itching and Other (See Comments)    Hair fell out Did it involve swelling of the face/tongue/throat, SOB, or low BP? No Did it involve sudden or severe rash/hives, skin peeling, or any reaction on the inside of your mouth or nose? Yes Did you need to seek medical attention at a hospital or doctor's office? In hospital reaction When did it last happen?      48 yrs old If all above answers are "NO", may proceed with cephalosporin use.    Medications  No current facility-administered medications for this encounter.  Current Outpatient Medications:    aspirin EC 81 MG tablet, Take 81 mg by mouth daily., Disp: , Rfl:    carvedilol (COREG) 6.25 MG tablet, Take 1 tablet (6.25 mg total) by mouth 2 (two) times daily with a meal., Disp: 180 tablet,  Rfl: 1   COVID-19 Antigen Test KIT, Place 1 Device into the nose daily as needed. (Patient not taking: Reported on 04/05/2023), Disp: 4 kit, Rfl: 1   ferrous sulfate 325 (65 FE) MG tablet, Take 1 tablet (325 mg total) by mouth 2 (two) times daily with a meal. (Patient taking differently: Take 325 mg by mouth every other day.), Disp: 60 tablet, Rfl: 5   hydrochlorothiazide (HYDRODIURIL) 25 MG tablet, Take 1 tablet (25 mg total) by mouth daily., Disp: 90 tablet, Rfl: 0   lisinopril (ZESTRIL) 10 MG tablet, Take 1 tablet (10 mg total) by mouth daily., Disp: 90 tablet, Rfl: 0   metroNIDAZOLE (FLAGYL) 500 MG tablet, Take 1 tablet (500 mg total) by mouth 2 (two) times daily. (Patient not taking: Reported on 04/16/2023), Disp: 14 tablet, Rfl: 0  Vitals   Vitals:   06-02-2023 1719 02-Jun-2023 1745  BP:  126/79  Pulse:  80  SpO2:  99%  Weight: 70.9 kg     Body mass index is 29.53 kg/m.  Physical Exam   Physical Exam Gen: A&O x4, NAD Resp: normal WOB CV: extremities appear well-perfused  Neurologic Examination   Neuro: *MS: A&O x4. Follows multi-step commands.  *Speech: nondysarthric, no aphasia, able to name and repeat *CN: PERRL 3mm, EOMI, VFF by confrontation, sensation intact, smile symmetric, hearing intact to voice *Motor:   Normal bulk.  No tremor, rigidity or bradykinesia. Minimal drift LLE no drift in any other extremity. *Sensory: SILT. Symmetric. No double-simultaneous extinction.  *Coordination:  Finger-to-nose, heel-to-shin, rapid alternating motions were intact. *Reflexes:  UTA 2/2 tele-exam *Gait: deferred  Labs/Imaging/Neurodiagnostic studies   CBC:  Recent Labs  Lab 06-02-2023 1735  WBC 7.5  NEUTROABS 5.2  HGB 13.7  HCT 42.1  MCV 85.4  PLT 353   Basic Metabolic Panel:  Lab Results  Component Value Date   NA 136 2023-06-02   K 3.6 June 02, 2023   CO2 26 06/02/2023   GLUCOSE 103 (H) 06-02-23   BUN 15 02-Jun-2023   CREATININE 0.79 2023/06/02   CALCIUM 9.9  June 02, 2023   GFRNONAA >60 06-02-2023   GFRAA >60 09/21/2019   Lipid Panel:  Lab Results  Component Value Date   LDLCALC 48 10/15/2022   HgbA1c:  Lab Results  Component Value Date   HGBA1C 5.5 10/15/2022   Urine Drug Screen:     Component Value Date/Time   LABOPIA NONE DETECTED 04/09/2019 2005   COCAINSCRNUR NONE DETECTED 04/09/2019 2005   LABBENZ NONE DETECTED 04/09/2019 2005  AMPHETMU NONE DETECTED 04/09/2019 2005   THCU NONE DETECTED 04/09/2019 2005   LABBARB NONE DETECTED 04/09/2019 2005    Alcohol Level     Component Value Date/Time   ETH <10 05/28/2023 1735   INR  Lab Results  Component Value Date   INR 0.9 05/28/2023   APTT  Lab Results  Component Value Date   APTT 26 05/28/2023   AED levels: No results found for: "PHENYTOIN", "ZONISAMIDE", "LAMOTRIGINE", "LEVETIRACETA"  CT Head without contrast(Personally reviewed): No acute process  CT angio Head and Neck with contrast(Personally reviewed): No LVO  ASSESSMENT   This is a 48 year old woman with past medical history significant for bipolar disorder, hypertension, B12 deficiency, stroke in 2019 and 2021 who presents with left-sided weakness and numbness.  Sx are currently too mild to treat with TNK but she will be monitored in ED until she is outside the window prior to being admitted to [facility].  RECOMMENDATIONS   - Recommend close monitoring in ED with vital signs and NIHSS both q 30 min until outside of the TNK window at 2030.  If neurologic exam is worsens a stroke code should be immediately reactivated.  If exam is stable or improved at 2030 pt should at that point be admitted to the hospitalist service at [facility] for stroke/TIA work-up.   After transfer to [facility]: - Permissive HTN x48 hrs from sx onset or until stroke ruled out by MRI goal BP <220/120. PRN labetalol or hydralazine if BP above these parameters. Avoid oral antihypertensives. 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  This previously measured approximately 1.5 mm.  This finding will be addressed by the stroke team after she is admitted to [facility]. ______________________________________________________________________    Signed, Jefferson Fuel, MD [facility]

## 2023-05-28 NOTE — Progress Notes (Signed)
 [facility] admit request: TIA VS CVA. 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  Symptoms today dizziness episode and nausea and left arm numbness. Left sided weakness.  Recommend: Orthostatic / TNI / Dimer and accepted med telemetry.  Admit for TIA/ CVA rule out.

## 2023-05-28 NOTE — ED Provider Notes (Signed)
 [place] EMERGENCY DEPARTMENT AT [facility] Provider Note   CSN: [id number] Arrival date & time: 05/28/23  1709     History {Add pertinent medical, surgical, social history, OB history to HPI:1} Chief Complaint  Patient presents with   Dizziness    Mallory Robinson is a 48 y.o. female.  HPI     48 year old female with a history of bipolar disorder type I, hypertension, depression, CVA, TIA, who presents with concern for episode of dizziness, nausea, and left sided hand numbness, found to have left sided weakness on exam.  She reports her prior stroke affected the right side (although per notes had prior right sided infarcts with left side affected) and does not believe she had left sided weakness before.  Suddenly felt sensation of dizziness like a lightheadedness with nausea and left hand and arm numbness.  The dizziness lasted seconds, numbness also lasted seconds, and nausea lasted about 30 minutes. At time of my evaluation reports she is feeling better but then was noted on exam to have left sided weakness which she believes is new and code stroke was called.  She reports she was herself until 4PM. No chest pain, fever, or dyspnea.     In 2020 had left-sided weakness and received tPA, MRI was negative for acute infarct at that time.  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  She was readmitted and February 2021 with left-sided numbness and had punctate infarcts in the right MCA territory with a right carotid bulb thrombus  Past Medical History:  Diagnosis Date   Bipolar 1 disorder (HCC)    Complication of anesthesia    Depression    Hypertension    Low vitamin B12 level 04/11/2019   PONV (postoperative nausea and vomiting)    Stroke [facility])     Home Medications Prior to Admission medications   Medication Sig Start Date End Date Taking? Authorizing Provider  aspirin EC 81 MG tablet Take 81 mg by mouth daily.    [provider]   carvedilol (COREG) 6.25 MG tablet Take 1 tablet (6.25 mg total) by mouth 2 (two) times daily with a meal. 10/24/22   Georganna Skeans, MD  COVID-19 Antigen Test KIT Place 1 Device into the nose daily as needed. Patient not taking: Reported on 04/05/2023 04/03/23 06/02/23  Barbaraann Barthel, NP  ferrous sulfate 325 (65 FE) MG tablet Take 1 tablet (325 mg total) by mouth 2 (two) times daily with a meal. Patient taking differently: Take 325 mg by mouth every other day. 05/01/22   Georganna Skeans, MD  hydrochlorothiazide (HYDRODIURIL) 25 MG tablet Take 1 tablet (25 mg total) by mouth daily. 05/14/23   Georganna Skeans, MD  lisinopril (ZESTRIL) 10 MG tablet Take 1 tablet (10 mg total) by mouth daily. 04/16/23   Georganna Skeans, MD  metroNIDAZOLE (FLAGYL) 500 MG tablet Take 1 tablet (500 mg total) by mouth 2 (two) times daily. Patient not taking: Reported on 04/16/2023 04/09/23   Federico Flake, MD      Allergies    Food and Penicillins    Review of Systems   Review of Systems  Physical Exam Updated Vital Signs BP 126/79   Pulse 80   Wt 70.9 kg   LMP 05/28/2023 (Approximate)   SpO2 99%   BMI 29.53 kg/m  Physical Exam  ED Results / Procedures / Treatments   Labs (all labs ordered are listed, but only abnormal results are displayed) Labs Reviewed  CBG MONITORING, ED - Abnormal; Notable for the following components:      Result Value   Glucose-Capillary 106 (*)    All other components within normal limits  CBC  DIFFERENTIAL  PROTIME-INR  APTT  COMPREHENSIVE METABOLIC PANEL WITH GFR  ETHANOL  PREGNANCY, URINE  CBG MONITORING, ED    EKG None  Radiology CT HEAD CODE STROKE WO CONTRAST Result Date: 05/28/2023 CLINICAL DATA:  Code stroke. Neuro deficit, episode of dizziness and nausea. EXAM: CT HEAD WITHOUT CONTRAST TECHNIQUE: Contiguous axial images were obtained from the base of the skull through the vertex without intravenous contrast. RADIATION DOSE REDUCTION: This exam was  performed according to the departmental dose-optimization program which includes automated exposure control, adjustment of the mA and/or kV according to patient size and/or use of iterative reconstruction technique. COMPARISON:  MRI head 04/10/2019.  CT head 04/09/2019. FINDINGS: Brain: No acute intracranial hemorrhage. No CT evidence of acute infarct. No edema, mass effect, or midline shift. The basilar cisterns are patent. Ventricles: The ventricles are normal. Vascular: No hyperdense vessel or unexpected calcification. Skull: No acute or aggressive finding. Orbits: Orbits are symmetric. Sinuses: The visualized paranasal sinuses are clear. Other: Mastoid air cells are clear. ASPECTS [facility] Stroke Program Early CT Score) - Ganglionic level infarction (caudate, lentiform nuclei, internal capsule, insula, M1-M3 cortex): 7 - Supraganglionic infarction (M4-M6 cortex): 3 Total score (0-10 with 10 being normal): 10 IMPRESSION: 1. No CT evidence of acute intracranial abnormality. 2. ASPECTS is 10 These results were called by telephone at the time of interpretation on 05/28/2023 at 5:49 pm to provider [facility] , who verbally acknowledged these results. Electronically Signed   By: Emily Filbert M.D.   On: 05/28/2023 17:49    Procedures Procedures  {Document cardiac monitor, telemetry assessment procedure when appropriate:1}  Medications Ordered in ED Medications  iohexol (OMNIPAQUE) 350 MG/ML injection 100 mL (75 mLs Intravenous Contrast Given 05/28/23 1747)    ED Course/ Medical Decision Making/ A&P   {   Click here for ABCD2, HEART and other calculatorsREFRESH Note before signing :1}                                 48 year old female with a history of bipolar disorder type I, hypertension, depression, CVA, TIA, who presents with concern for episode of dizziness, nausea, and left sided hand numbness, found to have left sided weakness on exam.  Code stroke called given within window.     {Document critical care time when appropriate:1} {Document review of labs and clinical decision tools ie heart score, Chads2Vasc2 etc:1}  {Document your independent review of radiology images, and any outside records:1} {Document your discussion with family members, caretakers, and with consultants:1} {Document social determinants of health affecting pt's care:1} {Document your decision making why or why not admission, treatments were needed:1} Final Clinical Impression(s) / ED Diagnoses Final diagnoses:  None    Rx / DC Orders ED Discharge Orders     None

## 2023-05-28 NOTE — Progress Notes (Signed)
 Stroke Alert cart activation at 1723. Patient in CT with telemedicine cart. Patient LKW 1600. MRS 1. Neurologist paged at 1726. Dr Bing Neighbors, joins the telemedicine cart at 1734. Patient and telemedicine cart back to room and exam continued at 1745.

## 2023-05-28 NOTE — ED Notes (Signed)
 Pt requesting not to be admitted states she has had strokes before and wants to go home.   EDP made aware

## 2023-05-28 NOTE — ED Notes (Signed)
 Neuromuscular assessment. Pt has partial weakness in LUE and LLE

## 2023-05-28 NOTE — ED Notes (Signed)
 Back from CT

## 2023-05-28 NOTE — ED Triage Notes (Signed)
 Pt coming in from home with an episode of dizziness and nausea. Pt states that she has had 2 previous strokes before and was concerned

## 2023-05-29 ENCOUNTER — Encounter: Payer: Self-pay | Admitting: Family Medicine

## 2023-05-30 ENCOUNTER — Telehealth: Admitting: Physician Assistant

## 2023-05-30 DIAGNOSIS — R3989 Other symptoms and signs involving the genitourinary system: Secondary | ICD-10-CM

## 2023-05-30 MED ORDER — SULFAMETHOXAZOLE-TRIMETHOPRIM 800-160 MG PO TABS: 1.0000 | ORAL_TABLET | Freq: Two times a day (BID) | ORAL | 0 refills | Status: AC

## 2023-05-30 NOTE — Progress Notes (Signed)

## 2023-05-30 NOTE — Progress Notes (Signed)
 I have spent 5 minutes in review of e-visit questionnaire, review and updating patient chart, medical decision making and response to patient.   Piedad Climes, PA-C

## 2023-05-31 ENCOUNTER — Ambulatory Visit: Payer: Self-pay

## 2023-05-31 ENCOUNTER — Telehealth: Payer: Self-pay | Admitting: *Deleted

## 2023-05-31 NOTE — Telephone Encounter (Signed)
 Spoke with pt after she received [employer]. Advised her to contact her PCP, or whomever she was referred to for f/u after her ED visit, for a note to return to work. She verbalized understanding.

## 2023-06-02 ENCOUNTER — Encounter: Payer: Self-pay | Admitting: Registered Nurse

## 2023-06-02 ENCOUNTER — Telehealth: Payer: Self-pay | Admitting: Registered Nurse

## 2023-06-02 DIAGNOSIS — R531 Weakness: Secondary | ICD-10-CM

## 2023-06-02 NOTE — Telephone Encounter (Signed)
 Telephone message received from patient 05/29/23 "Good morning Mallory Robinson  I  left yesterday evening from work and went to the ER at [facility].   I had a mini stroke and I am out today.  I wanted to make you aware of what was going on.  Feel free to reach out anytime.    My phone number has changed.  Mallory Robinson. [phone]"  Attempted to reach patient via telephone at 1031 automated message stated phone out of service.  Left message for patient on work email.    "[name],  I tried calling you this morning but it said your phone was out of service.  If you need any help from me don't hesitate to email.  I am currently in [place] helping my grandmother who had a stroke Tuesday but flying home to [facility] tomorrow and planning to be at [employer] for a half day Friday."

## 2023-06-02 NOTE — Telephone Encounter (Signed)
 Patient reported her telephone number was changed and now [phone]  demographics updated.

## 2023-06-02 NOTE — Telephone Encounter (Signed)
 Message received from patient 05/31/2023 Good afternoon,  I have been trying to find a provider to release me to work without restrictions.  Since I was never taken out of work for any  restrictions to begin with I am being met with confusion and push back.  I had an appointment today that was canceled because they could not provide what your needing.    I am at a loss with all of this and any direction would be appreciated.  I have not spoken with anyone from the wellness clinic/ PA but I did send an email Tuesday.     Mallory Robinson

## 2023-06-02 NOTE — Telephone Encounter (Signed)
 Spoke with patient via telephone stated she had notified her supervisor she was told by ER provider she had a ministroke earlier in the week.  Supervisor notified her she was required to have a return to work note stating if any work restrictions.  Patient stated ER provider never put her on restrictions so she was confused as to why she had to have a work note stating if she was cleared for full duty or if currently on restrictions.  She tried to get appt with PCM this week but none available.  She had already scheduled appt for 06/13/23 and that is the first available with provider.  She asked ER for work note and was told they do not provide these and she must follow up with PCM.  Asked patient if I could review her ER notes to assist her and see if I could find in provider note required information for HR/Supervisor.  Patient stated yes please do so.  ER note reviewed and telephone encounter.  No restrictions or clearance to return to work noted. Noted that patient was demonstrating left sided weakness in ER. Patient had signed out AMA from ER.  Contacted ER for work note and was instructed to follow up with PCM again 05/31/2023.  Secure message sent to Dr Elvan Hamel per patient request to assist patient 05/31/23 notified that work requiring note that specifies if she has any restrictions or is cleared to full duty after stroke unspecified encounter ER.  Patient was given Rx for plavix by ER provider 05/28/23  Patient notified I had sent message to [facility] awaiting response.  I recommended she bring job description to follow up appt with PCM.  Discussed I am not authorized to give return to work clearance/notes per contract at [employer] and that she would need to see her PCM or specialist.  Patient verbalized understanding information/instructions and had no further questions at that time.  She stated she would send me copy of her job description since I do not have access when offsite from work and I do not return  until Tuesday next week.  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

## 2023-06-04 ENCOUNTER — Telehealth: Admitting: Physician Assistant

## 2023-06-04 DIAGNOSIS — N390 Urinary tract infection, site not specified: Secondary | ICD-10-CM

## 2023-06-04 NOTE — Telephone Encounter (Signed)
 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  Patient phone automated message today states voicemail not set up and immediately goes to not available at this time message.  HR Tonya stated patient is allowed to return to work at this time and she sent patient email and attempted to speak with her yesterday but no answer.  Supervisor Britta Candy also did not have success contacting patient.  Sent patient message to work and personal email today  "Donnice, I have not received a reply from Dr Elvan Hamel to my provider to provider message.  HR Tonya did give me a printed copy of your job description which I have scanned and attached.  HR Rodney Clamp did notify me that you were cleared to return onsite Friday and that she did send an email to you notifying you   I just was notified today.  I do recommend follow up with your [facility] after ER visit and diagnosis of stroke/weakness.  Sincerely,  Sherilyn Dings NP-C"

## 2023-06-04 NOTE — Progress Notes (Signed)
  Because of recurring symptoms after recent antibiotics and need for urine culture at this point, I feel your condition warrants further evaluation and I recommend that you be seen in a face-to-face visit.   NOTE: There will be NO CHARGE for this E-Visit   If you are having a true medical emergency, please call 911.     For an urgent face to face visit, [place] has multiple urgent care centers for your convenience.  Click the link below for the full list of locations and hours, walk-in wait times, appointment scheduling options and driving directions:  Urgent Care - [place], [place], [place], [place], [place], [place]  [place]     Your MyChart E-visit questionnaire answers were reviewed by a board certified advanced clinical practitioner to complete your personal care plan based on your specific symptoms.    Thank you for using e-Visits.

## 2023-06-05 NOTE — Telephone Encounter (Signed)
 PCM Dr Elvan Hamel contacted me and asked that job description be faxed to her office.  RN Thersia Flax notified and completed task this am. 06/05/23 3 pages.

## 2023-06-13 ENCOUNTER — Ambulatory Visit: Admitting: Family Medicine

## 2023-06-13 ENCOUNTER — Telehealth: Payer: Self-pay | Admitting: Family Medicine

## 2023-06-13 DIAGNOSIS — R300 Dysuria: Secondary | ICD-10-CM | POA: Diagnosis not present

## 2023-06-13 NOTE — Telephone Encounter (Signed)
 Notified 06/06/23 patient resigned from [employer] no longer eligible for care at [employer].

## 2023-06-13 NOTE — Telephone Encounter (Signed)
 Patient answered NO to automated message called patient to see if she wanted to reschedule appointment left voicemail

## 2023-06-27 ENCOUNTER — Other Ambulatory Visit: Payer: Self-pay | Admitting: Family Medicine

## 2023-06-27 MED ORDER — HYDROCHLOROTHIAZIDE 25 MG PO TABS: 25.0000 mg | ORAL_TABLET | Freq: Every day | ORAL | 0 refills | Status: AC

## 2023-06-27 MED ORDER — LISINOPRIL 10 MG PO TABS: 10.0000 mg | ORAL_TABLET | Freq: Every day | ORAL | 0 refills | Status: DC

## 2023-06-28 ENCOUNTER — Inpatient Hospital Stay: Payer: No Typology Code available for payment source | Attending: Hematology and Oncology

## 2023-07-01 ENCOUNTER — Ambulatory Visit

## 2023-07-01 ENCOUNTER — Ambulatory Visit: Admitting: Family

## 2023-07-04 DIAGNOSIS — N390 Urinary tract infection, site not specified: Secondary | ICD-10-CM | POA: Diagnosis not present

## 2023-07-05 ENCOUNTER — Inpatient Hospital Stay: Payer: No Typology Code available for payment source | Admitting: Physician Assistant

## 2023-07-05 ENCOUNTER — Telehealth: Payer: Self-pay | Admitting: Physician Assistant

## 2023-09-29 ENCOUNTER — Other Ambulatory Visit: Payer: Self-pay | Admitting: Family Medicine
